# Patient Record
Sex: Female | Born: 1935 | Race: White | Hispanic: No | Smoking: Never smoker
Health system: Southern US, Community
[De-identification: ages and names within clinical notes are randomized; demographics above are authoritative.]

## PROBLEM LIST (undated history)

## (undated) DIAGNOSIS — F0390 Unspecified dementia without behavioral disturbance: Secondary | ICD-10-CM

## (undated) DIAGNOSIS — F329 Major depressive disorder, single episode, unspecified: Secondary | ICD-10-CM

## (undated) DIAGNOSIS — M810 Age-related osteoporosis without current pathological fracture: Secondary | ICD-10-CM

## (undated) DIAGNOSIS — R32 Unspecified urinary incontinence: Secondary | ICD-10-CM

## (undated) DIAGNOSIS — Z9181 History of falling: Secondary | ICD-10-CM

## (undated) DIAGNOSIS — M79643 Pain in unspecified hand: Secondary | ICD-10-CM

## (undated) DIAGNOSIS — R404 Transient alteration of awareness: Secondary | ICD-10-CM

## (undated) DIAGNOSIS — R269 Unspecified abnormalities of gait and mobility: Secondary | ICD-10-CM

## (undated) DIAGNOSIS — F411 Generalized anxiety disorder: Secondary | ICD-10-CM

## (undated) DIAGNOSIS — H9190 Unspecified hearing loss, unspecified ear: Secondary | ICD-10-CM

## (undated) DIAGNOSIS — B029 Zoster without complications: Secondary | ICD-10-CM

## (undated) DIAGNOSIS — E039 Hypothyroidism, unspecified: Secondary | ICD-10-CM

## (undated) DIAGNOSIS — K5900 Constipation, unspecified: Secondary | ICD-10-CM

## (undated) DIAGNOSIS — N8110 Cystocele, unspecified: Secondary | ICD-10-CM

## (undated) DIAGNOSIS — I10 Essential (primary) hypertension: Secondary | ICD-10-CM

## (undated) DIAGNOSIS — R1310 Dysphagia, unspecified: Secondary | ICD-10-CM

## (undated) HISTORY — DX: Unspecified abnormalities of gait and mobility: R26.9

## (undated) HISTORY — DX: Transient alteration of awareness: R40.4

## (undated) HISTORY — DX: Cystocele, unspecified: N81.10

## (undated) HISTORY — DX: Major depressive disorder, single episode, unspecified: F32.9

## (undated) HISTORY — DX: Generalized anxiety disorder: F41.1

## (undated) HISTORY — DX: Age-related osteoporosis without current pathological fracture: M81.0

## (undated) HISTORY — DX: Dysphagia, unspecified: R13.10

## (undated) HISTORY — DX: History of falling: Z91.81

## (undated) HISTORY — DX: Constipation, unspecified: K59.00

## (undated) HISTORY — DX: Unspecified hearing loss, unspecified ear: H91.90

## (undated) HISTORY — DX: Pain in unspecified hand: M79.643

## (undated) HISTORY — DX: Essential (primary) hypertension: I10

## (undated) HISTORY — DX: Unspecified urinary incontinence: R32

## (undated) HISTORY — DX: Zoster without complications: B02.9

---

## 1999-06-24 ENCOUNTER — Emergency Department (HOSPITAL_COMMUNITY): Admission: EM | Admit: 1999-06-24 | Discharge: 1999-06-24 | Payer: Self-pay | Admitting: Emergency Medicine

## 1999-06-26 ENCOUNTER — Emergency Department (HOSPITAL_COMMUNITY): Admission: EM | Admit: 1999-06-26 | Discharge: 1999-06-26 | Payer: Self-pay | Admitting: *Deleted

## 2000-01-01 ENCOUNTER — Encounter: Payer: Self-pay | Admitting: Emergency Medicine

## 2000-01-01 ENCOUNTER — Emergency Department (HOSPITAL_COMMUNITY): Admission: EM | Admit: 2000-01-01 | Discharge: 2000-01-01 | Payer: Self-pay | Admitting: Emergency Medicine

## 2003-08-22 DIAGNOSIS — B029 Zoster without complications: Secondary | ICD-10-CM

## 2003-08-22 HISTORY — DX: Zoster without complications: B02.9

## 2006-01-15 ENCOUNTER — Emergency Department (HOSPITAL_COMMUNITY): Admission: EM | Admit: 2006-01-15 | Discharge: 2006-01-15 | Payer: Self-pay | Admitting: Emergency Medicine

## 2006-01-17 ENCOUNTER — Inpatient Hospital Stay (HOSPITAL_COMMUNITY): Admission: EM | Admit: 2006-01-17 | Discharge: 2006-01-24 | Payer: Self-pay | Admitting: Emergency Medicine

## 2006-01-17 ENCOUNTER — Ambulatory Visit: Payer: Self-pay | Admitting: Cardiology

## 2006-01-18 ENCOUNTER — Encounter: Payer: Self-pay | Admitting: Internal Medicine

## 2006-01-18 ENCOUNTER — Encounter: Payer: Self-pay | Admitting: Vascular Surgery

## 2006-02-02 ENCOUNTER — Encounter: Admission: RE | Admit: 2006-02-02 | Discharge: 2006-02-02 | Payer: Self-pay | Admitting: Radiology

## 2006-12-24 HISTORY — PX: SPINE SURGERY: SHX786

## 2008-04-13 ENCOUNTER — Emergency Department (HOSPITAL_COMMUNITY): Admission: EM | Admit: 2008-04-13 | Discharge: 2008-04-13 | Payer: Self-pay | Admitting: Emergency Medicine

## 2008-05-26 ENCOUNTER — Observation Stay (HOSPITAL_COMMUNITY): Admission: EM | Admit: 2008-05-26 | Discharge: 2008-05-28 | Payer: Self-pay | Admitting: Emergency Medicine

## 2008-05-26 ENCOUNTER — Encounter (INDEPENDENT_AMBULATORY_CARE_PROVIDER_SITE_OTHER): Payer: Self-pay | Admitting: Internal Medicine

## 2008-06-23 HISTORY — PX: INCISION / DRAINAGE HAND / FINGER: SUR695

## 2009-07-16 ENCOUNTER — Emergency Department (HOSPITAL_COMMUNITY): Admission: EM | Admit: 2009-07-16 | Discharge: 2009-07-16 | Payer: Self-pay | Admitting: Emergency Medicine

## 2010-01-04 ENCOUNTER — Inpatient Hospital Stay (HOSPITAL_COMMUNITY)
Admission: EM | Admit: 2010-01-04 | Discharge: 2010-01-07 | Payer: Self-pay | Source: Home / Self Care | Attending: Internal Medicine | Admitting: Internal Medicine

## 2010-03-15 DIAGNOSIS — R32 Unspecified urinary incontinence: Secondary | ICD-10-CM | POA: Insufficient documentation

## 2010-03-15 DIAGNOSIS — I10 Essential (primary) hypertension: Secondary | ICD-10-CM

## 2010-03-15 DIAGNOSIS — M810 Age-related osteoporosis without current pathological fracture: Secondary | ICD-10-CM

## 2010-03-15 DIAGNOSIS — Z9181 History of falling: Secondary | ICD-10-CM

## 2010-03-15 DIAGNOSIS — F329 Major depressive disorder, single episode, unspecified: Secondary | ICD-10-CM

## 2010-03-15 DIAGNOSIS — K5900 Constipation, unspecified: Secondary | ICD-10-CM

## 2010-03-15 DIAGNOSIS — R404 Transient alteration of awareness: Secondary | ICD-10-CM

## 2010-03-15 DIAGNOSIS — R1310 Dysphagia, unspecified: Secondary | ICD-10-CM

## 2010-03-15 DIAGNOSIS — R269 Unspecified abnormalities of gait and mobility: Secondary | ICD-10-CM | POA: Insufficient documentation

## 2010-03-15 DIAGNOSIS — F411 Generalized anxiety disorder: Secondary | ICD-10-CM

## 2010-03-15 DIAGNOSIS — F339 Major depressive disorder, recurrent, unspecified: Secondary | ICD-10-CM | POA: Insufficient documentation

## 2010-03-15 DIAGNOSIS — F3289 Other specified depressive episodes: Secondary | ICD-10-CM

## 2010-03-15 HISTORY — DX: Unspecified abnormalities of gait and mobility: R26.9

## 2010-03-15 HISTORY — DX: Transient alteration of awareness: R40.4

## 2010-03-15 HISTORY — DX: Unspecified urinary incontinence: R32

## 2010-03-15 HISTORY — DX: Other specified depressive episodes: F32.89

## 2010-03-15 HISTORY — DX: Age-related osteoporosis without current pathological fracture: M81.0

## 2010-03-15 HISTORY — DX: Essential (primary) hypertension: I10

## 2010-03-15 HISTORY — DX: Dysphagia, unspecified: R13.10

## 2010-03-15 HISTORY — DX: Major depressive disorder, single episode, unspecified: F32.9

## 2010-03-15 HISTORY — DX: History of falling: Z91.81

## 2010-03-15 HISTORY — DX: Constipation, unspecified: K59.00

## 2010-03-15 HISTORY — DX: Generalized anxiety disorder: F41.1

## 2010-04-05 LAB — COMPREHENSIVE METABOLIC PANEL
ALT: 18 U/L (ref 0–35)
Albumin: 3.7 g/dL (ref 3.5–5.2)
Creatinine, Ser: 0.82 mg/dL (ref 0.4–1.2)
GFR calc non Af Amer: >60 mL/min (ref >60)
Total Bilirubin: 1.2 mg/dL (ref 0.3–1.2)

## 2010-04-05 LAB — BASIC METABOLIC PANEL
Creatinine, Ser: 0.84 mg/dL (ref 0.4–1.2)
GFR calc non Af Amer: >60 mL/min (ref >60)
Sodium: 136 mEq/L (ref 135–145)

## 2010-04-05 LAB — CBC
HCT: 36.2 % (ref 36.0–46.0)
Hemoglobin: 12.6 g/dL (ref 12.0–15.0)
MCV: 100 fL (ref 78.0–100.0)
Platelets: 272 10*3/uL (ref 150–400)
RBC: 3.6 MIL/uL — ABNORMAL LOW (ref 3.87–5.11)
RDW: 12.6 % (ref 11.5–15.5)
WBC: 15.7 10*3/uL — ABNORMAL HIGH (ref 4.0–10.5)

## 2010-04-05 LAB — URINALYSIS, ROUTINE W REFLEX MICROSCOPIC
Protein, ur: NEGATIVE mg/dL
Specific Gravity, Urine: 1.012 (ref 1.005–1.030)
pH: 7.5 (ref 5.0–8.0)

## 2010-04-05 LAB — DIFFERENTIAL
Eosinophils Absolute: 0 10*3/uL (ref 0.0–0.7)
Eosinophils Relative: 0 % (ref 0–5)
Lymphocytes Relative: 7 % — ABNORMAL LOW (ref 12–46)
Lymphs Abs: 1.1 10*3/uL (ref 0.7–4.0)
Monocytes Relative: 7 % (ref 3–12)
Neutro Abs: 13.5 10*3/uL — ABNORMAL HIGH (ref 1.7–7.7)

## 2010-04-10 LAB — BASIC METABOLIC PANEL
CO2: 26 mEq/L (ref 19–32)
Chloride: 98 mEq/L (ref 96–112)
GFR calc non Af Amer: >60 mL/min (ref >60)
Glucose, Bld: 100 mg/dL — ABNORMAL HIGH (ref 70–99)
Potassium: 3.5 mEq/L (ref 3.5–5.1)
Sodium: 137 mEq/L (ref 135–145)

## 2010-04-10 LAB — CBC
Hemoglobin: 13.5 g/dL (ref 12.0–15.0)
MCH: 35 pg — ABNORMAL HIGH (ref 26.0–34.0)
MCHC: 34.3 g/dL (ref 30.0–36.0)
Platelets: 326 10*3/uL (ref 150–400)

## 2010-04-10 LAB — CULTURE, ROUTINE-ABSCESS

## 2010-04-10 LAB — DIFFERENTIAL
Basophils Relative: 0 % (ref 0–1)
Eosinophils Absolute: 0.1 10*3/uL (ref 0.0–0.7)
Lymphs Abs: 1 10*3/uL (ref 0.7–4.0)
Neutro Abs: 8.4 10*3/uL — ABNORMAL HIGH (ref 1.7–7.7)
Neutrophils Relative %: 81 % — ABNORMAL HIGH (ref 43–77)

## 2010-04-10 LAB — PROTIME-INR
INR: 0.91 (ref 0.00–1.49)
Prothrombin Time: 12.2 seconds (ref 11.6–15.2)

## 2010-05-03 LAB — URINALYSIS, ROUTINE W REFLEX MICROSCOPIC
Bilirubin Urine: NEGATIVE
Glucose, UA: NEGATIVE mg/dL
Hgb urine dipstick: NEGATIVE
Nitrite: NEGATIVE
Protein, ur: NEGATIVE mg/dL
Specific Gravity, Urine: 1.015 (ref 1.005–1.030)
Urobilinogen, UA: 1 mg/dL (ref 0.0–1.0)
pH: 7.5 (ref 5.0–8.0)

## 2010-05-03 LAB — CBC
HCT: 38.8 % (ref 36.0–46.0)
Hemoglobin: 13.5 g/dL (ref 12.0–15.0)
MCHC: 34.8 g/dL (ref 30.0–36.0)
MCV: 99.8 fL (ref 78.0–100.0)
Platelets: 447 K/uL — ABNORMAL HIGH (ref 150–400)
RBC: 3.89 MIL/uL (ref 3.87–5.11)
RDW: 12 % (ref 11.5–15.5)
WBC: 9.6 K/uL (ref 4.0–10.5)

## 2010-05-03 LAB — DIFFERENTIAL
Basophils Absolute: 0 10*3/uL (ref 0.0–0.1)
Basophils Relative: 0 % (ref 0–1)
Eosinophils Absolute: 0 10*3/uL (ref 0.0–0.7)
Eosinophils Relative: 0 % (ref 0–5)
Lymphocytes Relative: 13 % (ref 12–46)
Lymphs Abs: 1.3 K/uL (ref 0.7–4.0)
Monocytes Absolute: 0.8 K/uL (ref 0.1–1.0)
Monocytes Relative: 9 % (ref 3–12)
Neutro Abs: 7.5 10*3/uL (ref 1.7–7.7)
Neutrophils Relative %: 78 % — ABNORMAL HIGH (ref 43–77)

## 2010-05-03 LAB — LIPID PANEL
HDL: 58 mg/dL (ref >39)
LDL Cholesterol: 81 mg/dL (ref 0–99)
Total CHOL/HDL Ratio: 2.7 RATIO
Triglycerides: 84 mg/dL (ref <150)
VLDL: 17 mg/dL (ref 0–40)

## 2010-05-03 LAB — POCT CARDIAC MARKERS
CKMB, poc: <1 ng/mL — ABNORMAL LOW (ref 1.0–8.0)
Myoglobin, poc: 85.3 ng/mL (ref 12–200)
Troponin i, poc: <0.05 ng/mL (ref 0.00–0.09)

## 2010-05-03 LAB — BASIC METABOLIC PANEL
BUN: 12 mg/dL (ref 6–23)
BUN: 15 mg/dL (ref 6–23)
Calcium: 8.3 mg/dL — ABNORMAL LOW (ref 8.4–10.5)
Calcium: 8.6 mg/dL (ref 8.4–10.5)
Chloride: 99 mEq/L (ref 96–112)
Chloride: 99 mEq/L (ref 96–112)
Creatinine, Ser: 0.58 mg/dL (ref 0.4–1.2)
Creatinine, Ser: 0.64 mg/dL (ref 0.4–1.2)

## 2010-05-03 LAB — POCT I-STAT, CHEM 8
Chloride: 92 mEq/L — ABNORMAL LOW (ref 96–112)
HCT: 41 % (ref 36.0–46.0)
Hemoglobin: 13.9 g/dL (ref 12.0–15.0)
Potassium: 4.5 mEq/L (ref 3.5–5.1)
Sodium: 127 mEq/L — ABNORMAL LOW (ref 135–145)

## 2010-05-03 LAB — COMPREHENSIVE METABOLIC PANEL WITH GFR
AST: 21 U/L (ref 0–37)
Albumin: 4.3 g/dL (ref 3.5–5.2)
Calcium: 9.7 mg/dL (ref 8.4–10.5)
Chloride: 92 meq/L — ABNORMAL LOW (ref 96–112)
Creatinine, Ser: 0.82 mg/dL (ref 0.4–1.2)
GFR calc Af Amer: >60 mL/min (ref >60)
Total Bilirubin: 1 mg/dL (ref 0.3–1.2)

## 2010-05-03 LAB — OSMOLALITY: Osmolality: 269 mOsm/kg — ABNORMAL LOW (ref 275–300)

## 2010-05-03 LAB — COMPREHENSIVE METABOLIC PANEL
ALT: 15 U/L (ref 0–35)
Alkaline Phosphatase: 66 U/L (ref 39–117)
BUN: 16 mg/dL (ref 6–23)
CO2: 28 mEq/L (ref 19–32)
GFR calc non Af Amer: >60 mL/min (ref >60)
Glucose, Bld: 126 mg/dL — ABNORMAL HIGH (ref 70–99)
Potassium: 4.1 mEq/L (ref 3.5–5.1)
Sodium: 129 mEq/L — ABNORMAL LOW (ref 135–145)
Total Protein: 6.8 g/dL (ref 6.0–8.3)

## 2010-05-03 LAB — MAGNESIUM
Magnesium: 1.4 mg/dL — ABNORMAL LOW (ref 1.5–2.5)
Magnesium: 2.2 mg/dL (ref 1.5–2.5)

## 2010-05-05 LAB — POCT CARDIAC MARKERS

## 2010-05-05 LAB — POCT I-STAT, CHEM 8
BUN: 15 mg/dL (ref 6–23)
Calcium, Ion: 0.98 mmol/L — ABNORMAL LOW (ref 1.12–1.32)
Creatinine, Ser: 0.8 mg/dL (ref 0.4–1.2)
Hemoglobin: 13.6 g/dL (ref 12.0–15.0)
TCO2: 26 mmol/L (ref 0–100)
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
## 2010-06-07 NOTE — Discharge Summary (Signed)
[name], [name]              ACCOUNT NO.:  [id number]   MEDICAL RECORD NO.:  [id number]          PATIENT TYPE:  INP   LOCATION:  1513                         FACILITY:  [facility]   PHYSICIAN:  Marcellus Scott, MD     DATE OF BIRTH:  April 26, 1917   DATE OF ADMISSION:  05/26/2008  DATE OF DISCHARGE:  05/28/2008                               DISCHARGE SUMMARY   PRIMARY MEDICAL DOCTOR:  Ace Gins.   DISCHARGE DIAGNOSES:  1. Hyponatremia secondary to thiazide diuretics.  2. Hypokalemia, repleted.  3. Hypertension.  4. History of depression.  5. History of frequent falls.  6. History of T12 fracture status post vertebroplasty.  7. History of syncope.   DISCHARGE MEDICATIONS:  1. Celexa 20 mg p.o. daily.  2. Norvasc 10 mg p.o. daily.  3. Avapro 300 mg p.o. daily.   DISCONTINUE MEDICATIONS:  Lisinopril/hydrochlorothiazide.   PROCEDURES:  1. Chest x-ray on May 26, 2008 - impression:  No acute cardiopulmonary      abnormality.  2. CT of the head without contrast - impression:  No acute      intracranial abnormality.  Chronic small-vessel disease of the      white matter.  No interval change.   PERTINENT LABORATORY DATA:  Magnesium 2.2.  Basic metabolic panel:  Sodium 130, potassium 4.5, chloride 99, bicarb 24, glucose 110, BUN 15,  creatinine 0.58, calcium 8.6.  Serum osmolality 269.  TSH 2.138.  Urine  sodium 85.  Lipid panel unremarkable.  Urine creatinine 112.  Hepatic  panel within normal limits.  Urinalysis with no features suggestive of  UTI.  No proteinuria or blood.  Point of care cardiac markers x1  negative.  Initial sodium on BMET was 127.   CONSULTATIONS:  None.   HOSPITAL COURSE AND PATIENT DISPOSITION:  [name] is a very pleasant  77 year old Caucasian female patient with history of hypertension and  depression who presented with generalized weakness, fatigue, poor oral  intake.  She said she was so weak that she could not even go out  shopping, and her  appetite was poor.  Evaluation in the emergency room  revealed blood pressure of 170/80 with significant orthostatic changes.  Her serum sodium as indicated was 129 with normal BUN and creatinine.  She was evaluated to have dehydration with associated hyponatremia from  hydrochlorothiazide and orthostatic hypotension secondary to same.  The  patient was thereby admitted for further evaluation and management.   1. Hyponatremia.  This was most likely secondary to      hydrochlorothiazide and dehydration secondary to poor oral intake.      Her hydrochlorothiazide was discontinued.  The patient was hydrated      with IV normal saline with improvement in her serum sodium.  Within      the first 24 hours, she perked up and felt much stronger.  Has been      pleasant and cheerful since, ambulating the hallways, meeting with      other patients.  Her appetite is good.  She denies any complaints.      She specifically denies any  delusions, hallucinations, suicidal or      homicidal ideations.  She does not complain of any dizziness,      lightheadedness, chest pain or palpitations.  Will discontinue her      hydrochlorothiazide.  Her blood pressures now range mostly between      120s to 130s/70s to 80s.  If the hyponatremia persists, consider      changing her Celexa to another agent.  2. Hypokalemia which was probably secondary to diuretics was repleted      and corrected.  3. Hypertension.  Management as indicated above.  4. Dehydration - resolved.  5. Depression.  Although patient had been started on Effexor in      addition to the Celexa, I believe her initial presentation was most      likely secondary to her depleted volume status and hyponatremia,      and hence, we will discontinue Effexor and leave her on Celexa      alone and to consider adjusting medications as deemed necessary as      an outpatient.   The patient at this time is stable for discharge home to follow up with  her  primary M.D. in 1 week's time with repeat basic metabolic panel.   Time taken in coordinating this discharge was 25 minutes.      Marcellus Scott, MD  Electronically Signed     AH/MEDQ  D:  05/28/2008  T:  05/28/2008  Job:  [id number]   cc:   Ace Gins, MD

## 2010-06-10 NOTE — Discharge Summary (Signed)
NAMELEAR, CARSTENS              ACCOUNT NO.:  [id number]   MEDICAL RECORD NO.:  [id number]          PATIENT TYPE:  INP   LOCATION:  2041                         FACILITY:  [facility]   PHYSICIAN:  Marcellus Scott, MD     DATE OF BIRTH:  1917/09/13   DATE OF ADMISSION:  01/16/2006  DATE OF DISCHARGE:                               DISCHARGE SUMMARY   INTERIM DISCHARGE SUMMARY:   DATE OF DISCHARGE:  To be determined.   PRIMARY CARE PHYSICIAN:  [facility].   DISCHARGE DIAGNOSES:  1. T12 fracture.  2. History of recurrent falls.  3. Hyponatremia.  4. Hypokalemia.  5. Hypertension.  6. Long QT.  7. Leukocytosis.  8. Skin rash.   DISCHARGE MEDICATIONS:  To be determined on actual discharge.   PROCEDURES:  1. On January 19, 2006, vertebroplasty by interventional radiology,      which was technically successful.  Please see their notes for      further details.  2. On January 18, 2006, MRI of the spine without and with contrast.      Impression:  (1) Moderately severe acute fracture of T12 with      retropulsion of bone into the canal causing mild spinal stenosis,      but no compression of the conus medullaris.  (2) Minimal acute      fracture of the superior endplate of L4 on the left.  (3) Right      paracentral disk protrusion with swelling and moderate spinal      stenosis at L4 and L5.  3. On January 17, 2006, an x-ray of the pelvis.  Impression:  No      acute abnormality.  4. On January 17, 2006, an x-ray of the thoracic spine.  Impression:      (1) No new abnormality since January 15, 2006.  (2) Stable  50% T12 superior endplate compression fracture.  1. The patient had an echocardiogram done on January 18, 2006.      Overall LV function was normal and ejection fraction was estimated      to be 65%.  2. The patient had carotid Dopplers as a part of the workup.  There      was no significant right ICA stenosis or left ICA stenosis.  3. Urine  culture was reported as no growth.   CONSULTATIONS:  1. Interventional radiology.  2. [facility].   HOSPITAL COURSE AND CONDITION OF PATIENT:  For details of the initial  part of the admission, please refer to the history and physical note  done by Dr. Elliot Cousin on January 16, 2006.  In summary, Ms. Moylan  is an 77 year old pleasant female patient with a history of  hypertension, generalized anxiety, degenerative joint disease, and  hypokalemia, who presented to the emergency department with complaints  of back pain and inability to ambulate secondary to the same.  She had  sustained falls of at least twice prior to this admission.  She was  evaluated in the emergency room on January 15, 2006, and was found to  have a T12 wedge  compression fracture.  She was given a prescription for  Vicodin and sent home; however, the pain persisted and the patient  presented to the emergency room.  On further evaluation in the emergency  room, she was found to have a T12 compression fracture, as well as  electrolyte abnormalities, and was admitted for further evaluation.   1. T12 compression fracture.  The patient was admitted to the      hospital.  She had an MRI of the spine done with and without      contrast.  The findings are as above.  Interventional radiology was      consulted, who did a T12 vertebroplasty after cardiac clearance by      cardiology on January 19, 2006.  They followed the patient until      today and have signed off.  The patient has progressively done well      with decrease in pain and she has been out of bed to chair today,      which was also with only mild pain.  The patient has been evaluated      by physical therapy and occupational therapy, who recommended home      health and PT at discharge with a rolling walker plus a three-in-      one commode.  2. History of recurring falls.  The exact etiology of these falls is      not clear; however, it may be  multifactorial related to her      degenerative joint disease, arthritis, electrolyte abnormalities,      weakness, and the aforementioned.  The patient lives alone.  A case      management consult will be done to assess the safety of the patient      returning home with home health versus the need for a skilled      nursing facility placement.  The patient had an echocardiogram done      on January 18, 2006.  Overall LV function was normal and ejection      fraction was estimated to be 65%.  3. Hyponatremia.  On admission, the patient was found to have a sodium      of 124, potassium of 2.4, chloride of 84, bicarb of 28, glucose of      140, BUN of 19, creatinine of 0.9.  This was thought to be      secondary to the diuretics that the patient was on, which was      promptly held.  The patient was placed on intravenous saline      hydration, as well as her potassium and magnesium were      supplemented, which have progressively improved.  The most current      labs are sodium of 131, potassium 3.6, chloride 92, bicarb 29,      glucose 131, BUN 12, creatinine 0.6, and calcium of 9.5.  For      further evaluation of the hyponatremia, she had serum osmolality,      which was 272; a urine osmolality of 610; urine sodium of 130;      urine potassium of 94.  The etiology of this hyponatremia is      probably secondary to the diuretics, but could be SIADH or      questionable salt losing state.  However, this has remained steady      and she is asymptomatic of this and stable.  This has to be  followed up as an outpatient.  4. Hypokalemia, which has been repleted and corrected.  5. Hypertension.  The patient has been running intermittent blood      pressures in 180s.  Norvasc was added at 2.5 mg on January 19, 2006, but the blood pressure still continued to be elevated.  So,      the Norvasc will be titrated up. 6. Long QT.  On initial admission, the patient was noted to have a       long QT of 615.  This was probably secondary to her electrolyte      abnormalities.  Cardiology was consulted, who suggested repleting      the electrolytes.  With that, the QT has promptly decreased to 429,      however; I had discussed this with one of the patient's daughters,      and also suggested that the patient's children be evaluated by      their primary care physicians by EKGs for long QT.  7. Leukocytosis.  The patient had a white blood cell count on      admission of 13.3.  This was thought to be stress related and there      was no focus of sepsis, which has normalized.  8. Skin rash, which has not responded to hydrocortisone.  We      prescribed Bactroban and to followup.  9. Normocytic anemia.  B12 level is 642 and folate level is 19.4.      This may be an anemia of chronic disease, which will need to be      worked up as an outpatient.      Marcellus Scott, MD  Electronically Signed     AH/MEDQ  D:  01/21/2006  T:  01/22/2006  Job:  [id number]   cc:   [facility]

## 2010-06-10 NOTE — H&P (Signed)
[name], [name] NO.:  [id number]   MEDICAL RECORD NO.:  [id number]          PATIENT TYPE:  INP   LOCATION:  2041                         FACILITY:  [facility]   PHYSICIAN:  Elliot Cousin, M.D.    DATE OF BIRTH:  01-Jul-1917   DATE OF ADMISSION:  01/16/2006  DATE OF DISCHARGE:                              HISTORY & PHYSICAL   PRIMARY CARE PHYSICIAN:  [facility]   CHIEF COMPLAINT:  Thoracic back pain, lower back pain, inability to walk  secondary to pain.   HISTORY OF PRESENT ILLNESS:  The patient is an 77 year old woman with a  past medical history significant for hypertension and anxiety who  presents to the Emergency Department with a chief complaint of back pain  and the inability to ambulate.  The patient says that she was putting up  Christmas decorations last week and accidentally fell off of the ladder.  This occurred approximately 1 week ago.  There was no loss of  consciousness or head trauma.  She did have pain in her mid to lower  back and her hips.  Over the course of the past several days, the  patient's pain became progressively worse.  She actually presented to  the Emergency Department on January 15, 2006.  At that time, the x-ray  of her spine revealed a T12 wedge compression deformity.  The patient  was given a prescription for Vicodin and sent home.  Over the past 24  hours, the patient's pain has progressed.  It is moderate to severe in  intensity.  It radiates from the mid back down to below her hips.  Movement worsens the pain.  Sitting still eases the pain.  The Vicodin  helped a little, however, it caused nausea and therefore she has not  taken anymore.  The patient denies numbness or tingling in her  extremities.  She denies shortness of breath or pleurisy.  The patient  lives alone.  Her children brought her in to the Emergency Department  tonight for assistance with pain management and disposition.   During the  evaluation in the Emergency Department, a followup x-ray of  her thoracic spine was ordered by the Emergency Department physician.  The x-ray reveals a stable T12 compression fracture.  Also of note, the  patient's serum potassium is low at 2.4 and she is hyponatremic with a  serum sodium of 125.  The patient will therefore be admitted for further  evaluation and management.   PAST MEDICAL HISTORY:  1. Hypertension.  2. Generalized anxiety.  3. Hypokalemia in the past.  4. Degenerative joint disease.   MEDICATIONS:  1. Potassium chloride 20 mEq daily.  2. Chlorthalidone 25 mg daily.  3. Atacand 32 mg daily.  4. Vicodin 5 mg every 4 hours as needed.  5. Lorazepam 0.5 mg b.i.d. p.r.n.   ALLERGIES:  No known drug allergies.   SOCIAL HISTORY:  The patient is divorced.  She lives alone in  [place].  She has four children.  She is a retired Investment banker, corporate.  She  denies tobacco use.  She drinks alcohol  only occasionally.  She is  fairly independent and still drives.   FAMILY HISTORY:  Her mother died of old age at 23 years old.  Her  father died of a heart attack at 39 years of age.   REVIEW OF SYSTEMS:  The patient's review of systems is positive for back  pain, poor appetite and nausea associated with Vicodin.  She  occasionally has insomnia. She has had generalized malaise, but no fever  or chills.  Otherwise, review of systems is negative.   EXAM:  Temperature 97.0, blood pressure 164/79, pulse 84, respiratory  rate 20, oxygen saturation 97% on room air.  GENERAL:  The patient is a pleasant, 77 year old, elderly, Caucasian  woman who is currently lying in bed in no acute distress at this time.  HEENT:  Head is normocephalic, nontraumatic.  Pupils equal, round and  reactive to light.  Extraocular movements are intact.  Conjunctivae are  clear, sclerae are white.  Tympanic membranes are clear bilaterally.  Nasal mucosa is mildly dry.  No sinus tenderness.  Oropharynx reveals   good dentition. Mucous membranes are moist are mildly dry.  No posterior  exudates or erythema.  NECK:  Neck is supple, no adenopathy, no thyromegaly, no bruit, no JVD.  LUNGS:  Decreased breath sounds in the bases, otherwise clear.  HEART:  S1, S2 with no murmurs, rubs, or gallops.  ABDOMEN:  Mildly obese, positive bowel sounds, soft, nontender,  nondistended, no hepatosplenomegaly.  No masses palpated.  BACK:  There is moderate tenderness over the thoracic and lumbosacral  spine and surrounding paraspinal muscles.  No appreciable warmth or  erythema or edema.  EXTREMITIES:  Pedal pulses palpable bilaterally.  No pretibial edema and  no pedal edema.  PELVIS: There is some mild to moderate tenderness over the sacrum.  No  tenderness over her legs bilaterally.  The patient is able to flex and  extend her hips and her knees with minimal discomfort bilaterally.  NEUROLOGIC:  The patient is alert and oriented x 3, cranial nerves II  through XII are intact.  Strength is mildly decreased in her lower  extremities secondary to discomfort, approximately 5-/5.  Sensation is  intact.   ADMISSION LABORATORIES:  X-ray of the spine reveals a stable, 50%  compression fracture at T12, no new changes from 01/15/2006.  There is  evidence of osteopenia and degenerative joint changes.  CT scan of the  head reveals no acute intracranial findings.  There is evidence of  atrophy and chronic microvascular white matter disease.  Remote  cerebellar infarct.   Sodium 124, potassium 2.4, chloride 84, CO2 28, glucose 140, BUN 19,  creatinine 0.9, calcium 9.1, total protein 6.9, albumin 3.8, AST 28, ALT  21, alkaline phosphatase 51.  WBC 13.3, hemoglobin 12.8, platelets 411.   ASSESSMENT:  1. Generalized failure to thrive, inability to ambulate secondary to      pain from the T12 fracture.  2. Hypokalemia.  The patient's serum potassium is 2.4.  She says that     she has a history of low potassium in the past  and is treated with      potassium supplementation chronically.  Of note, the patient says      that she has not taken her potassium supplement in approximately 1      week.  The hypokalemia is probably secondary to the chlorthalidone.      Magnesium deficiency will need to be ruled out.  3. Hyponatremia.  The patient's  serum sodium is 125.  The patient may      be volume depleted.  4. Mild hyperglycemia.  The patient gives no history of diabetes      mellitus.  Her venous glucose is mildly elevated at 140.  5. Leucocytosis.  The patient's WBC is 13.3.  She is afebrile.  No      obvious signs of infection at this time.  6. Hypertension.  The patient's blood pressure is moderately elevated.      She is chronically treated with Atacand and chlorthalidone.   PLAN:  1. The patient will be admitted for further evaluation and management.  2. We will check a MRI of the thoracic/lumbar spine.  We will also      order an x-ray of the pelvis to rule out a pelvic fracture.  3. Consult Interventional Radiology for evaluation  of a possible      vertebroplasty.  4. We will replete potassium chloride via oral supplementation and IV      fluids.  We will check a magnesium level to rule out deficiency.  5. Pain management with oxycodone and Dilaudid as needed.  6. Occupational Therapy and Physical Therapy consultations.  7. We will also evaluate the patient's TSH, urinalysis, hemoglobin A1c      and PT and PTT.  8. Gentle IV fluids with normal saline and potassium chloride added.   ADDENDUM:  The patient's EKG reveals prolonged QT interval and  nonspecific ST and T wave abnormalities. No old EKG is currently  available. We will assess cardiac enzymes and a magnesium level.      Elliot Cousin, M.D.  Electronically Signed     DF/MEDQ  D:  01/17/2006  T:  01/17/2006  Job:  [id number]

## 2010-06-10 NOTE — Discharge Summary (Signed)
[name], CYPHERS NO.:  [id number]   MEDICAL RECORD NO.:  [id number]          PATIENT TYPE:  INP   LOCATION:  2041                         FACILITY:  [facility]   PHYSICIAN:  Michaelyn Barter, M.D. DATE OF BIRTH:  August 10, 1917   DATE OF ADMISSION:  01/16/2006  DATE OF DISCHARGE:  01/24/2006                               DISCHARGE SUMMARY   The patient's primary care physician is [facility].   This is a final discharge summary that will follow the events of the  patient's hospitalization only during the timeframe of December 31 up  until January 24, 2006.  For events that occurred prior to December 31,  please refer to the prior discharge summary .   FINAL DIAGNOSES:  1. T12 fracture.  The patient initially complained of pain with      regards to a T12 fracture which at this time had been addressed via      vertebroplasty.  For the last two days of the patient's      hospitalization, she indicated that her pain was better.  Likewise,      by the last two days of hospitalization she indicated that she had      been up ambulating, and by the final day of her hospitalization she      indicated that her symptoms were significantly improved.  2.  With      regards to number 2, prolonged QRS issue resolved over the course      of the hospitalization.  Cardiology continued to follow the patient      up to the last day of her hospitalization.  2. Syncopal episode.  The patient indicated that she had some      difficulties with regards to producing a bowel movement during the      latter portion of her hospitalization.  She stated that she was      having to strain significantly in order to have a BM.  She was      provided with a laxative a well as an enema.  On January 23, 2006,      the patient indicated that while attempting to have a BM she      strained significantly and ended up having a syncopal episode.      This appeared to be vasovagal in nature.   As a result, she did have      a large or massive bowel movement.  The patient indicated that she      felt significantly better although weak after having the bowel      movement.  She was monitored over the next 24 hours, and she stated      that she felt significantly better.  She had no repeat episodes of      syncope.  Likewise, she never complained of any chest pain or      shortness of breath during the course of the hospitalization.  3. Frequent falling.  Physical Therapy did follow the patient over the      course of the hospitalization.  4. Hypokalemia.  Supplementation was required during the course of      this hospitalization to address the patient's potassium level.   CONDITION AT THE TIME OF DISCHARGE:  On the day of discharge, the  patient indicated that she felt very good.  She stated that her appetite  was great and indicated that she had been walking down the hall without  any problems.  Vitals, her  temperature was 98, her heart rate 84,  respirations 18, blood pressure 120/72.  O2 saturation 96% on room air.  The patient's laboratories, her sodium was 134, potassium 4.4, chloride  98, CO2 28, BUN 15, creatinine 0.8, glucose 111, calcium 10.1, magnesium  1.7.   A decision was made to discharge the patient from the hospital.  The  patient was discharged on the following medication:  1. Lisinopril 5 mg 1 tablet p.o. daily.  2. Senokot-S 1 tablet p.o. daily.  3. Oxycodone 5 mg 1 tablet q.6h. p.r.n.  4. Norvasc 5 mg 1 tablet p.o. daily.  5. Calcium carbonate.  6. MiraLax 17 grams p.o. daily.  7. Avapro 300 mg 1 tablet p.o. daily.   The patient was told to avoid falling, to stop taking Atacand since the  Avapro had been started, and to follow up with [facility] within two to four weeks.      Michaelyn Barter, M.D.  Electronically Signed     OR/MEDQ  D:  01/27/2006  T:  01/27/2006  Job:  [id number]

## 2010-06-10 NOTE — Consult Note (Signed)
NAMEANNALYNN, CENTANNI              ACCOUNT NO.:  [id number]   MEDICAL RECORD NO.:  [id number]          PATIENT TYPE:  INP   LOCATION:  2041                         FACILITY:  [facility]   PHYSICIAN:  Bruce R. Juanda Chance, MD, FACCDATE OF BIRTH:  March 11, 1917   DATE OF CONSULTATION:  01/17/2006  DATE OF DISCHARGE:                                 CONSULTATION   REASON FOR CONSULTATION:  Abnormal electrocardiogram.   CLINICAL HISTORY:  Mrs. Etheridge is 77 years old and suffered a fall a week  ago and subsequently developed worse pain and difficulty standing.  She  was found to have a T12 compression fracture and was admitted to the  hospital for therapy.  After admission she was found to have  hyponatremia with sodium of 125, hypokalemia with a potassium of 2.4 and  a low magnesium of 1.6.  Her ECG showed a long QT with a QTc of 651  msec.  She also had poor R wave progression and nonspecific ST-T  changes.   She said she has had no previous cardiac history and no history of chest  pain, shortness of breath or palpitations.  She has no family history of  sudden cardiac death.   PAST MEDICAL HISTORY:  Her past medical history is significant for  hypertension, anxiety, previous hypokalemia and osteoporosis.   SOCIAL HISTORY:  She lives alone in [place].  She has four children.  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   FAMILY HISTORY:  Her mother died at age 55 and her father died at age  36 after suffering a myocardial infarction is his 67s.  She has no  siblings.   MEDICATIONS:  Her medications at home include Atacand, potassium,  lorazepam, chlorthalidone and hydrocodone.   REVIEW OF SYSTEMS:  Positive for anxiety and arthralgias.   PHYSICAL EXAMINATION:  The blood pressure is 136/74 and the pulse 86 and  regular.  There was no venous distension.  The carotid pulses were full  without bruits.  The chest was  clear without rales or rhonchi.  The  cardiac rhythm was regular.  The first and second heart sounds were  normal and I could hear no murmurs or gallops.  The abdomen was soft  with normal bowel sounds.  There was no hepatosplenomegaly.  The  peripheral pulses were full and there is no peripheral edema.  Musculoskeletal system showed no deformity.  She had a brace on her  chest and back.  The skin was warm and dry.  Neurologic examination  showed no focal neurological signs.   LABORATORIES:  Her troponin 0.07.  Her MB was 4.3 and her CK was 118.   IMPRESSION:  1. T12 fracture.  2. Abnormal electrocardiogram with markedly prolonged QT possibly      related to hypokalemia and low magnesium.  3. Hyponatremia, hypokalemia and low magnesium.  4. Hypertension.   RECOMMENDATIONS:  I agree with your plans to get a 2-D echocardiogram  and replace her potassium and magnesium.  The QT prolongation is quite  striking and we should repeat the ECG once her magnesium and potassium  are corrected.  I have discussed this situation with Dr. Graciela Husbands and he  suggests that we screen her children with ECGs to see if there is a  hereditary long QT syndrome in the family.  However, she has no family  history of sudden cardiac death or syncope.      Bruce Elvera Lennox Juanda Chance, MD, [facility]  Electronically Signed     BRB/MEDQ  D:  01/17/2006  T:  01/17/2006  Job:  [id number]   cc:   [facility]  [facility]

## 2011-02-14 DIAGNOSIS — R269 Unspecified abnormalities of gait and mobility: Secondary | ICD-10-CM | POA: Diagnosis not present

## 2011-02-14 DIAGNOSIS — I10 Essential (primary) hypertension: Secondary | ICD-10-CM | POA: Diagnosis not present

## 2011-02-14 DIAGNOSIS — Z9181 History of falling: Secondary | ICD-10-CM | POA: Diagnosis not present

## 2011-02-14 DIAGNOSIS — R1311 Dysphagia, oral phase: Secondary | ICD-10-CM | POA: Diagnosis not present

## 2011-05-08 DIAGNOSIS — H903 Sensorineural hearing loss, bilateral: Secondary | ICD-10-CM | POA: Diagnosis not present

## 2011-07-03 DIAGNOSIS — H35369 Drusen (degenerative) of macula, unspecified eye: Secondary | ICD-10-CM | POA: Diagnosis not present

## 2011-07-03 DIAGNOSIS — H5340 Unspecified visual field defects: Secondary | ICD-10-CM | POA: Diagnosis not present

## 2011-07-03 DIAGNOSIS — H2510 Age-related nuclear cataract, unspecified eye: Secondary | ICD-10-CM | POA: Diagnosis not present

## 2011-07-31 DIAGNOSIS — H40019 Open angle with borderline findings, low risk, unspecified eye: Secondary | ICD-10-CM | POA: Diagnosis not present

## 2011-08-14 DIAGNOSIS — I10 Essential (primary) hypertension: Secondary | ICD-10-CM | POA: Diagnosis not present

## 2011-08-22 DIAGNOSIS — I10 Essential (primary) hypertension: Secondary | ICD-10-CM | POA: Diagnosis not present

## 2011-08-22 DIAGNOSIS — R269 Unspecified abnormalities of gait and mobility: Secondary | ICD-10-CM | POA: Diagnosis not present

## 2011-08-22 DIAGNOSIS — H9190 Unspecified hearing loss, unspecified ear: Secondary | ICD-10-CM

## 2011-08-22 DIAGNOSIS — R32 Unspecified urinary incontinence: Secondary | ICD-10-CM | POA: Diagnosis not present

## 2011-08-22 HISTORY — DX: Unspecified hearing loss, unspecified ear: H91.90

## 2011-11-15 DIAGNOSIS — Z23 Encounter for immunization: Secondary | ICD-10-CM | POA: Diagnosis not present

## 2012-05-06 ENCOUNTER — Other Ambulatory Visit: Payer: Self-pay | Admitting: Internal Medicine

## 2012-08-08 ENCOUNTER — Other Ambulatory Visit: Payer: Self-pay | Admitting: Geriatric Medicine

## 2012-08-13 ENCOUNTER — Encounter: Payer: Self-pay | Admitting: Internal Medicine

## 2012-08-13 ENCOUNTER — Other Ambulatory Visit: Payer: Self-pay | Admitting: Geriatric Medicine

## 2012-08-13 ENCOUNTER — Non-Acute Institutional Stay: Payer: Medicare Other | Admitting: Internal Medicine

## 2012-08-13 VITALS — BP 122/72 | HR 68 | Ht 69.0 in | Wt 121.0 lb

## 2012-08-13 DIAGNOSIS — F329 Major depressive disorder, single episode, unspecified: Secondary | ICD-10-CM

## 2012-08-13 DIAGNOSIS — R269 Unspecified abnormalities of gait and mobility: Secondary | ICD-10-CM

## 2012-08-13 DIAGNOSIS — M79643 Pain in unspecified hand: Secondary | ICD-10-CM

## 2012-08-13 DIAGNOSIS — R32 Unspecified urinary incontinence: Secondary | ICD-10-CM | POA: Diagnosis not present

## 2012-08-13 DIAGNOSIS — M79609 Pain in unspecified limb: Secondary | ICD-10-CM

## 2012-08-13 DIAGNOSIS — I10 Essential (primary) hypertension: Secondary | ICD-10-CM | POA: Diagnosis not present

## 2012-08-13 HISTORY — DX: Pain in unspecified hand: M79.643

## 2012-08-13 MED ORDER — AMLODIPINE BESYLATE 10 MG PO TABS: 10.0000 mg | ORAL_TABLET | Freq: Every day | ORAL | Status: DC

## 2012-08-13 MED ORDER — LOSARTAN POTASSIUM 100 MG PO TABS: 100.0000 mg | ORAL_TABLET | Freq: Every day | ORAL | Status: DC

## 2012-08-13 NOTE — Patient Instructions (Signed)
Continue current medications. 

## 2012-08-13 NOTE — Progress Notes (Signed)
  Subjective:    Patient ID: [name], female    DOB: [birth year], 79 y.o.   MRN: [id number]  HPI Unspecified essential hypertension: controlled  Unspecified urinary incontinence: controlled on Vesicare  Abnormality of gait: uses walker sometimes.  Depressive disorder, not elsewhere classified: mild. No medications.  Current Outpatient Prescriptions on File Prior to Visit  Medication Sig Dispense Refill  . Calcium Carbonate-Vitamin D (CALCIUM 600+D) 600-200 MG-UNIT TABS Take by mouth. Take one daily for bones      . ibuprofen (ADVIL,MOTRIN) 200 MG tablet Take 200 mg by mouth. Take one as needed      . metoprolol succinate (TOPROL-XL) 25 MG 24 hr tablet Take 25 mg by mouth. Take 1/2 tablet once daily for blood pressure      . VESICARE 5 MG tablet TAKE 1 TABLET ONCE DAILY TO HELP CONTROL BLADDER.  30 tablet  5     She was also taking metoprolol and losartan. These were added to her list.    Review of Systems  Constitutional: Negative for activity change, appetite change and fatigue.  HENT: Positive for hearing loss. Negative for ear pain.   Eyes: Negative.   Respiratory: Negative.  Negative for shortness of breath.   Cardiovascular: Negative for chest pain, palpitations and leg swelling.  Gastrointestinal: Negative.   Endocrine: Negative.   Genitourinary:       Incontinence  Musculoskeletal:       Pain in both hands  Skin: Negative.   Neurological: Negative.   Hematological: Negative.   Psychiatric/Behavioral: Negative.        Objective:BP 122/72  Pulse 68  Ht 5\' 9"  (1.753 m)  Wt 121 lb (54.885 kg)  BMI 17.86 kg/m2    Physical Exam  Constitutional: She is oriented to person, place, and time. She appears well-developed and well-nourished. No distress.  HENT:  Head: Normocephalic and atraumatic.  Partial hearing loss.  Eyes: Conjunctivae and EOM are normal. Pupils are equal, round, and reactive to light.  Neck: Normal range of motion. Neck supple. No JVD  present. No tracheal deviation present. No thyromegaly present.  Cardiovascular: Normal rate, regular rhythm and normal heart sounds.  Exam reveals no gallop and no friction rub.   No murmur heard. Pulmonary/Chest: No respiratory distress. She has no wheezes. She has no rales. She exhibits no tenderness.  Abdominal: Bowel sounds are normal. She exhibits no distension and no mass. There is no tenderness.  Musculoskeletal: Normal range of motion. She exhibits no edema and no tenderness.  Pain in hands  Lymphadenopathy:    She has no cervical adenopathy.  Neurological: She is alert and oriented to person, place, and time. No cranial nerve deficit. Coordination normal.  Skin: No rash noted. No erythema. No pallor.  Psychiatric: She has a normal mood and affect. Her behavior is normal. Judgment and thought content normal.          Assessment & Plan:  Unspecified essential hypertension : continue amLODipine (NORVASC) 10 MG tablet, metoprolol,, and losartan  Unspecified urinary incontinence: continue Vesicare  Abnormality of gait: use cane when needed  Depressive disorder, not elsewhere classified: minor problem. No medication.  Pain in hand, unspecified laterality: try Aspercreme or Myoflex

## 2012-10-10 ENCOUNTER — Other Ambulatory Visit: Payer: Self-pay | Admitting: Internal Medicine

## 2012-10-24 DIAGNOSIS — Z23 Encounter for immunization: Secondary | ICD-10-CM | POA: Diagnosis not present

## 2012-11-14 ENCOUNTER — Other Ambulatory Visit: Payer: Self-pay | Admitting: Internal Medicine

## 2012-12-23 ENCOUNTER — Other Ambulatory Visit: Payer: Self-pay | Admitting: *Deleted

## 2012-12-23 DIAGNOSIS — I10 Essential (primary) hypertension: Secondary | ICD-10-CM

## 2012-12-23 MED ORDER — AMLODIPINE BESYLATE 10 MG PO TABS: 10.0000 mg | ORAL_TABLET | Freq: Every day | ORAL | Status: DC

## 2012-12-23 MED ORDER — LOSARTAN POTASSIUM 100 MG PO TABS: 100.0000 mg | ORAL_TABLET | Freq: Every day | ORAL | Status: DC

## 2013-01-28 ENCOUNTER — Encounter: Payer: Self-pay | Admitting: Internal Medicine

## 2013-02-04 ENCOUNTER — Encounter: Payer: Self-pay | Admitting: Internal Medicine

## 2013-02-24 ENCOUNTER — Other Ambulatory Visit: Payer: Self-pay | Admitting: Internal Medicine

## 2013-02-24 DIAGNOSIS — I10 Essential (primary) hypertension: Secondary | ICD-10-CM | POA: Diagnosis not present

## 2013-02-24 DIAGNOSIS — R32 Unspecified urinary incontinence: Secondary | ICD-10-CM | POA: Diagnosis not present

## 2013-02-24 DIAGNOSIS — Z79899 Other long term (current) drug therapy: Secondary | ICD-10-CM | POA: Diagnosis not present

## 2013-02-24 LAB — HEPATIC FUNCTION PANEL
ALK PHOS: 64 U/L (ref 25–125)
ALT: 9 U/L (ref 7–35)
AST: 18 U/L (ref 13–35)
BILIRUBIN, TOTAL: 0.7 mg/dL

## 2013-02-24 LAB — BASIC METABOLIC PANEL
BUN: 23 mg/dL — AB (ref 4–21)
CREATININE: 0.9 mg/dL (ref 0.5–1.1)
Glucose: 99 mg/dL
Potassium: 4.2 mmol/L (ref 3.4–5.3)
Sodium: 139 mmol/L (ref 137–147)

## 2013-02-24 LAB — LIPID PANEL
CHOLESTEROL: 209 mg/dL — AB (ref 0–200)
HDL: 57 mg/dL (ref 35–70)
LDL CALC: 127 mg/dL
TRIGLYCERIDES: 127 mg/dL (ref 40–160)

## 2013-03-04 ENCOUNTER — Non-Acute Institutional Stay: Payer: Medicare Other | Admitting: Internal Medicine

## 2013-03-04 ENCOUNTER — Encounter: Payer: Self-pay | Admitting: Internal Medicine

## 2013-03-04 VITALS — BP 142/78 | HR 64 | Ht 69.0 in | Wt 125.0 lb

## 2013-03-04 DIAGNOSIS — M6530 Trigger finger, unspecified finger: Secondary | ICD-10-CM | POA: Insufficient documentation

## 2013-03-04 DIAGNOSIS — R32 Unspecified urinary incontinence: Secondary | ICD-10-CM | POA: Diagnosis not present

## 2013-03-04 DIAGNOSIS — R269 Unspecified abnormalities of gait and mobility: Secondary | ICD-10-CM

## 2013-03-04 DIAGNOSIS — I10 Essential (primary) hypertension: Secondary | ICD-10-CM

## 2013-03-04 NOTE — Patient Instructions (Signed)
Use Aspercreme or Myoflex on hands 4 times daily.

## 2013-03-04 NOTE — Progress Notes (Signed)
Patient ID: [name], female   DOB: Dec 31, 1917, 80 y.o.   MRN: [id number]    Location:  [facility]   Place of Service: Clinic (12)    Allergies  Allergen Reactions  . Ace Inhibitors   . Hydrochlorothiazide   . Morphine And Related     Chief Complaint  Patient presents with  . Medical Managment of Chronic Issues    blood pressure, depression    HPI:   Generally feeling well.  Unspecified essential hypertension: controlled  Incontinence improved on Vesicare.    Medications: Patient's Medications  New Prescriptions   No medications on file  Previous Medications   AMLODIPINE (NORVASC) 10 MG TABLET    Take 1 tablet (10 mg total) by mouth daily. For blood pressure   CALCIUM CARBONATE-VITAMIN D (CALCIUM 600+D) 600-200 MG-UNIT TABS    Take by mouth. Take one daily for bones   IBUPROFEN (ADVIL,MOTRIN) 200 MG TABLET    Take 200 mg by mouth. Take one as needed   LOSARTAN (COZAAR) 100 MG TABLET    Take 1 tablet (100 mg total) by mouth daily. For blood pressure   METOPROLOL SUCCINATE (TOPROL-XL) 25 MG 24 HR TABLET    TAKE ONE AND 1/2 TABLET (37.5MG ) DAILY FOR BLOOD PRESSURE.   VESICARE 5 MG TABLET    TAKE 1 TABLET ONCE DAILY TO HELP CONTROL BLADDER.  Modified Medications   No medications on file  Discontinued Medications   No medications on file     Review of Systems  Constitutional: Negative for activity change, appetite change and fatigue.  HENT: Positive for hearing loss. Negative for ear pain.   Eyes: Negative.   Respiratory: Negative.  Negative for shortness of breath.   Cardiovascular: Negative for chest pain, palpitations and leg swelling.  Gastrointestinal: Negative.   Endocrine: Negative.   Genitourinary:       Incontinence  Musculoskeletal:       Pain in both hands  Skin: Negative.   Neurological: Negative.   Hematological: Negative.   Psychiatric/Behavioral: Negative.     Filed Vitals:   03/04/13 0907  BP: 142/78  Pulse: 64  Height:  5\' 9"  (1.753 m)  Weight: 125 lb (56.7 kg)   Physical Exam  Constitutional: She is oriented to person, place, and time. She appears well-developed and well-nourished. No distress.  HENT:  Head: Normocephalic and atraumatic.  Partial hearing loss.  Eyes: Conjunctivae and EOM are normal. Pupils are equal, round, and reactive to light.  Neck: Normal range of motion. Neck supple. No JVD present. No tracheal deviation present. No thyromegaly present.  Cardiovascular: Normal rate, regular rhythm and normal heart sounds.  Exam reveals no gallop and no friction rub.   No murmur heard. Pulmonary/Chest: No respiratory distress. She has no wheezes. She has no rales. She exhibits no tenderness.  Abdominal: Bowel sounds are normal. She exhibits no distension and no mass. There is no tenderness.  Musculoskeletal: Normal range of motion. She exhibits no edema and no tenderness.  Pain in hands. Right 3rd trigger finger. Uses walker due to unstable gait.  Lymphadenopathy:    She has no cervical adenopathy.  Neurological: She is alert and oriented to person, place, and time. No cranial nerve deficit. Coordination normal.  Skin: No rash noted. No erythema. No pallor.  Psychiatric: She has a normal mood and affect. Her behavior is normal. Judgment and thought content normal.     Labs reviewed: Nursing Home on 03/04/2013  Component Date Value Range Status  .  Glucose 02/24/2013 99   Final  . BUN 02/24/2013 23* 4 - 21 mg/dL Final  . Creatinine 02/24/2013 0.9  0.5 - 1.1 mg/dL Final  . Potassium 02/24/2013 4.2  3.4 - 5.3 mmol/L Final  . Sodium 02/24/2013 139  137 - 147 mmol/L Final  . Triglycerides 02/24/2013 127  40 - 160 mg/dL Final  . Cholesterol 02/24/2013 209* 0 - 200 mg/dL Final  . HDL 02/24/2013 57  35 - 70 mg/dL Final  . LDL Cholesterol 02/24/2013 127   Final  . Alkaline Phosphatase 02/24/2013 64  25 - 125 U/L Final  . ALT 02/24/2013 9  7 - 35 U/L Final  . AST 02/24/2013 18  13 - 35 U/L Final    . Bilirubin, Total 02/24/2013 0.7   Final      Assessment/Plan  Unspecified essential hypertension: controlled  Abnormality of gait: unchanged. Using walker more regularly  Unspecified urinary incontinence; improved with Vesicare

## 2013-03-21 ENCOUNTER — Encounter: Payer: Self-pay | Admitting: Internal Medicine

## 2013-03-26 ENCOUNTER — Other Ambulatory Visit: Payer: Self-pay | Admitting: Nurse Practitioner

## 2013-04-28 ENCOUNTER — Other Ambulatory Visit: Payer: Self-pay | Admitting: Internal Medicine

## 2013-08-12 DIAGNOSIS — M7150 Other bursitis, not elsewhere classified, unspecified site: Secondary | ICD-10-CM | POA: Diagnosis not present

## 2013-08-12 DIAGNOSIS — M2040 Other hammer toe(s) (acquired), unspecified foot: Secondary | ICD-10-CM | POA: Diagnosis not present

## 2013-08-12 DIAGNOSIS — IMO0002 Reserved for concepts with insufficient information to code with codable children: Secondary | ICD-10-CM | POA: Diagnosis not present

## 2013-08-12 DIAGNOSIS — M79609 Pain in unspecified limb: Secondary | ICD-10-CM | POA: Diagnosis not present

## 2013-08-21 DIAGNOSIS — M25579 Pain in unspecified ankle and joints of unspecified foot: Secondary | ICD-10-CM | POA: Diagnosis not present

## 2013-08-21 DIAGNOSIS — M779 Enthesopathy, unspecified: Secondary | ICD-10-CM | POA: Diagnosis not present

## 2013-09-03 ENCOUNTER — Other Ambulatory Visit: Payer: Self-pay | Admitting: Internal Medicine

## 2013-09-04 DIAGNOSIS — M25579 Pain in unspecified ankle and joints of unspecified foot: Secondary | ICD-10-CM | POA: Diagnosis not present

## 2013-09-04 DIAGNOSIS — M779 Enthesopathy, unspecified: Secondary | ICD-10-CM | POA: Diagnosis not present

## 2013-09-09 ENCOUNTER — Encounter: Payer: Self-pay | Admitting: Internal Medicine

## 2013-10-06 ENCOUNTER — Other Ambulatory Visit: Payer: Self-pay | Admitting: Internal Medicine

## 2013-11-08 DIAGNOSIS — Z23 Encounter for immunization: Secondary | ICD-10-CM | POA: Diagnosis not present

## 2013-12-01 DIAGNOSIS — B351 Tinea unguium: Secondary | ICD-10-CM | POA: Diagnosis not present

## 2013-12-01 DIAGNOSIS — L859 Epidermal thickening, unspecified: Secondary | ICD-10-CM | POA: Diagnosis not present

## 2013-12-11 DIAGNOSIS — E785 Hyperlipidemia, unspecified: Secondary | ICD-10-CM | POA: Diagnosis not present

## 2013-12-11 DIAGNOSIS — I10 Essential (primary) hypertension: Secondary | ICD-10-CM | POA: Diagnosis not present

## 2013-12-11 LAB — BASIC METABOLIC PANEL
BUN: 17 mg/dL (ref 4–21)
Creatinine: 0.8 mg/dL (ref 0.5–1.1)
Glucose: 98 mg/dL
POTASSIUM: 4.5 mmol/L (ref 3.4–5.3)
SODIUM: 135 mmol/L — AB (ref 137–147)

## 2013-12-11 LAB — LIPID PANEL
CHOLESTEROL: 188 mg/dL (ref 0–200)
HDL: 58 mg/dL (ref 35–70)
LDL Cholesterol: 106 mg/dL
LDl/HDL Ratio: 3.2
Triglycerides: 120 mg/dL (ref 40–160)

## 2013-12-11 LAB — HEPATIC FUNCTION PANEL
ALT: 10 U/L (ref 7–35)
AST: 16 U/L (ref 13–35)
Alkaline Phosphatase: 64 U/L (ref 25–125)
Bilirubin, Total: 0.8 mg/dL

## 2013-12-12 ENCOUNTER — Other Ambulatory Visit: Payer: Self-pay | Admitting: Internal Medicine

## 2013-12-16 ENCOUNTER — Encounter: Payer: Self-pay | Admitting: Internal Medicine

## 2013-12-16 ENCOUNTER — Non-Acute Institutional Stay: Payer: Medicare Other | Admitting: Internal Medicine

## 2013-12-16 VITALS — BP 168/78 | HR 64 | Temp 97.5°F | Ht 59.0 in | Wt 125.0 lb

## 2013-12-16 DIAGNOSIS — I10 Essential (primary) hypertension: Secondary | ICD-10-CM | POA: Diagnosis not present

## 2013-12-16 DIAGNOSIS — F329 Major depressive disorder, single episode, unspecified: Secondary | ICD-10-CM | POA: Diagnosis not present

## 2013-12-16 DIAGNOSIS — N3941 Urge incontinence: Secondary | ICD-10-CM

## 2013-12-16 DIAGNOSIS — R269 Unspecified abnormalities of gait and mobility: Secondary | ICD-10-CM | POA: Diagnosis not present

## 2013-12-16 DIAGNOSIS — G4700 Insomnia, unspecified: Secondary | ICD-10-CM

## 2013-12-16 DIAGNOSIS — M79643 Pain in unspecified hand: Secondary | ICD-10-CM | POA: Diagnosis not present

## 2013-12-16 DIAGNOSIS — F32A Depression, unspecified: Secondary | ICD-10-CM

## 2013-12-16 NOTE — Progress Notes (Signed)
Patient ID: Levin Bacon, female   DOB: 11-28-1917, 80 y.o.   MRN: [id number]    HISTORY AND PHYSICAL  Location:  [place] of Service: Clinic (12)   Extended Emergency Contact Information Primary Emergency Contact: [place] Phone: [phone] Relation: Other  Advanced Directive information Does patient have an advance directive?: Yes, Type of Advance Directive: Healthcare Power of [place];Living will  Chief Complaint  Patient presents with  . Annual Exam    Comprehensive Exam: blood pressure, depression, anxiety, gait  . Stress    about world events, doesn't sleep well, bad dreams, about people coming and taking her away.    HPI:  Essential hypertension: SBP elevated. She blames this on white coat syndrome. Denies headache, palpitations, or chest pain.  Pain of hand, unspecified laterality: osteoarthritis. Using Tylenol.  Abnormality of gait: unstable balance. Could not do exercises at [facility] because she could not keep her balance.  Depression: denies depression or hopeless feelings. Sh does have some anxiety "about the world situation"  Urge incontinence of urine: improved on Vesicare  Insomnia: better in the last couple of days. Has been worrying about Muslims and recent attacks in [place].    Past Medical History  Diagnosis Date  . Unspecified hearing loss 08/22/2011  . Anxiety state, unspecified 03/15/2010  . Depressive disorder, not elsewhere classified 03/15/2010  . Unspecified essential hypertension 03/15/2010  . Unspecified constipation 03/15/2010  . Senile osteoporosis 03/15/2010  . Other alteration of consciousness 03/15/2010  . Abnormality of gait 03/15/2010  . Dysphagia, unspecified(787.20) 03/15/2010  . Unspecified urinary incontinence 03/15/2010  . Personal history of fall 03/15/2010  . Herpes zoster without mention of complication 04/18/7122  . Pain in hand 08/13/12    Past Surgical History  Procedure Laterality Date  . Spine  surgery  12/2006  . Incision / drainage hand / finger Right 06/2008    hand    Patient Care Team: Estill Dooms, MD as PCP - General (Internal Medicine) [facility] Gearlean Alf, MD as Consulting Physician (Orthopedic Surgery)  History   Social History  . Marital Status: Divorced    Spouse Name: N/A    Number of Children: N/A  . Years of Education: N/A   Occupational History  . [employer]    Social History Main Topics  . Smoking status: Never Smoker   . Smokeless tobacco: Never Used  . Alcohol Use: Yes     Comment: one glass wine at dinner  . Drug Use: No  . Sexual Activity: No   Other Topics Concern  . Not on file   Social History Narrative   Lives at [facility]   Never smoked   Exercise walks a lot, with walker   POA, Living Will     reports that she has never smoked. She has never used smokeless tobacco. She reports that she drinks alcohol. She reports that she does not use illicit drugs.  Family History  Problem Relation Age of Onset  . Heart disease Father     MI   Family Status  Relation Status Death Age  . Mother Deceased 49    unknown "old age"  . Father Deceased 65  . Daughter Alive   . Son Alive   . Daughter Alive   . Son Alive     Immunization History  Administered Date(s) Administered  . Influenza Whole 10/24/2011, 10/24/2012  . Influenza-Unspecified 11/06/2013  .  Pneumococcal Polysaccharide-23 01/23/2009  . Td 01/23/2009    Allergies  Allergen Reactions  . Ace Inhibitors   . Hydrochlorothiazide   . Morphine And Related     Medications: Patient's Medications  New Prescriptions   No medications on file  Previous Medications   AMLODIPINE (NORVASC) 10 MG TABLET    TAKE (1) TABLET DAILY FOR HIGH BLOOD PRESSURE.   CALCIUM CARBONATE-VITAMIN D (CALCIUM 600+D) 600-200 MG-UNIT TABS    Take by mouth. Take one daily for bones   IBUPROFEN (ADVIL,MOTRIN) 200 MG TABLET    Take 200 mg by mouth.  Take one as needed   LOSARTAN (COZAAR) 100 MG TABLET    TAKE (1) TABLET DAILY FOR HIGH BLOOD PRESSURE.   METOPROLOL SUCCINATE (TOPROL-XL) 25 MG 24 HR TABLET    TAKE ONE AND 1/2 TABLET (37.5MG ) DAILY FOR BLOOD PRESSURE.   VESICARE 5 MG TABLET    TAKE 1 TABLET ONCE DAILY TO HELP CONTROL BLADDER.  Modified Medications   No medications on file  Discontinued Medications   No medications on file    Review of Systems  Constitutional: Negative for activity change, appetite change and fatigue.  HENT: Positive for hearing loss. Negative for ear pain.   Eyes: Negative.   Respiratory: Negative.  Negative for shortness of breath.   Cardiovascular: Negative for chest pain, palpitations and leg swelling.       HTN  Gastrointestinal: Negative.   Endocrine: Negative.   Genitourinary:       Incontinence  Musculoskeletal:       Pain in both hands: R>L.  Skin: Negative.   Neurological: Negative.   Hematological: Negative.   Psychiatric/Behavioral: The patient is nervous/anxious.     Filed Vitals:   12/16/13 1012  BP: 168/78  Pulse: 64  Temp: 97.5 F (36.4 C)  TempSrc: Oral  Height: 4\' 11"  (1.499 m)  Weight: 125 lb (56.7 kg)  SpO2: 93%   Body mass index is 25.23 kg/(m^2).  Physical Exam  Constitutional: She is oriented to person, place, and time. She appears well-developed and well-nourished. No distress.  HENT:  Head: Normocephalic and atraumatic.  Partial hearing loss.  Eyes: Conjunctivae and EOM are normal. Pupils are equal, round, and reactive to light.  Neck: Normal range of motion. Neck supple. No JVD present. No tracheal deviation present. No thyromegaly present.  Cardiovascular: Normal rate, regular rhythm and normal heart sounds.  Exam reveals no gallop and no friction rub.   No murmur heard. Pulmonary/Chest: No respiratory distress. She has no wheezes. She has no rales. She exhibits no tenderness.  Abdominal: Bowel sounds are normal. She exhibits no distension and no mass.  There is no tenderness.  Musculoskeletal: Normal range of motion. She exhibits no edema or tenderness.  Pain in hands. Right 3rd trigger finger. Uses walker due to unstable gait. Left 2nd toe is deviated to the great toe and has elevated DIP joint that rubs on her shoe.  Lymphadenopathy:    She has no cervical adenopathy.  Neurological: She is alert and oriented to person, place, and time. No cranial nerve deficit. Coordination normal.  Skin: No rash noted. No erythema. No pallor.  Psychiatric: She has a normal mood and affect. Her behavior is normal. Judgment and thought content normal.     Labs reviewed: Nursing Home on 12/16/2013  Component Date Value Ref Range Status  . Glucose 12/11/2013 98   Final  . BUN 12/11/2013 17  4 - 21 mg/dL Final  . Creatinine 12/11/2013 0.8  0.5 - 1.1 mg/dL Final  . Potassium 12/11/2013 4.5  3.4 - 5.3 mmol/L Final  . Sodium 12/11/2013 135* 137 - 147 mmol/L Final  . LDl/HDL Ratio 12/11/2013 3.2   Final  . Triglycerides 12/11/2013 120  40 - 160 mg/dL Final  . Cholesterol 12/11/2013 188  0 - 200 mg/dL Final  . HDL 12/11/2013 58  35 - 70 mg/dL Final  . LDL Cholesterol 12/11/2013 106   Final  . Alkaline Phosphatase 12/11/2013 64  25 - 125 U/L Final  . ALT 12/11/2013 10  7 - 35 U/L Final  . AST 12/11/2013 16  13 - 35 U/L Final  . Bilirubin, Total 12/11/2013 0.8   Final       Assessment/Plan  1. Essential hypertension High SBP. She will check BP at home and bring results to me in a couple of weeks.  2. Pain of hand, unspecified laterality Try Myoflex or Aspercreme  3. Abnormality of gait Walk daily  4. Depression Not depressed  5. Urge incontinence of urine Continue Vesicare  6. Insomnia Try Motrin PM

## 2013-12-31 ENCOUNTER — Telehealth: Payer: Self-pay | Admitting: *Deleted

## 2013-12-31 MED ORDER — METOPROLOL SUCCINATE ER 50 MG PO TB24: ORAL_TABLET | ORAL | Status: DC

## 2013-12-31 NOTE — Telephone Encounter (Signed)
Patient Notified and faxed Rx into pharmacy 

## 2013-12-31 NOTE — Telephone Encounter (Signed)
BP Readings:  Week 1 11/25  158/68 11/26  155/76 11/27  134/73 11/28  137/71 11/29  146/68  11/30  135/65  Week 2 12/1  140/67 12/2  150/68 12/3  136/71 12/4  139/61 12/5  141/61 12/6  140/68 12/7  143/67  Per Dr. Ysidro Evert Metoprolol Succinate to 50mg  once daily Tried calling patient on 12/9--No Answer

## 2014-01-07 ENCOUNTER — Telehealth: Payer: Self-pay | Admitting: *Deleted

## 2014-01-07 NOTE — Telephone Encounter (Signed)
Dr. Nyoka Cowden received patient's BP Readings  12/12  127/71            132/770  12/13  123/65  130/58  12/14  130/60  132/65  Per Dr. Ronnette Juniper BP looks fine. Continue 25mg  Metoprolol. Patient Notified and patient stated that she is taking 1/2 tablet of the 50mg  daily and will continue.

## 2014-01-09 ENCOUNTER — Other Ambulatory Visit: Payer: Self-pay | Admitting: *Deleted

## 2014-01-09 MED ORDER — METOPROLOL SUCCINATE ER 25 MG PO TB24: ORAL_TABLET | ORAL | Status: DC

## 2014-01-09 NOTE — Telephone Encounter (Signed)
[facility]  

## 2014-01-22 DIAGNOSIS — H903 Sensorineural hearing loss, bilateral: Secondary | ICD-10-CM | POA: Diagnosis not present

## 2014-01-22 DIAGNOSIS — H6123 Impacted cerumen, bilateral: Secondary | ICD-10-CM | POA: Diagnosis not present

## 2014-02-20 ENCOUNTER — Other Ambulatory Visit: Payer: Self-pay | Admitting: Internal Medicine

## 2014-02-23 DIAGNOSIS — M2042 Other hammer toe(s) (acquired), left foot: Secondary | ICD-10-CM | POA: Diagnosis not present

## 2014-02-23 DIAGNOSIS — M7150 Other bursitis, not elsewhere classified, unspecified site: Secondary | ICD-10-CM | POA: Diagnosis not present

## 2014-02-24 DIAGNOSIS — M2042 Other hammer toe(s) (acquired), left foot: Secondary | ICD-10-CM | POA: Diagnosis not present

## 2014-03-31 ENCOUNTER — Other Ambulatory Visit: Payer: Self-pay | Admitting: Internal Medicine

## 2014-06-03 ENCOUNTER — Other Ambulatory Visit: Payer: Self-pay | Admitting: Internal Medicine

## 2014-06-03 ENCOUNTER — Encounter: Payer: Self-pay | Admitting: Internal Medicine

## 2014-06-03 DIAGNOSIS — H9192 Unspecified hearing loss, left ear: Secondary | ICD-10-CM

## 2014-06-03 DIAGNOSIS — H9190 Unspecified hearing loss, unspecified ear: Secondary | ICD-10-CM | POA: Insufficient documentation

## 2014-06-16 ENCOUNTER — Encounter: Payer: Medicare Other | Admitting: Internal Medicine

## 2014-06-23 ENCOUNTER — Non-Acute Institutional Stay: Payer: Medicare Other | Admitting: Internal Medicine

## 2014-06-23 ENCOUNTER — Encounter: Payer: Self-pay | Admitting: Internal Medicine

## 2014-06-23 VITALS — BP 142/72 | HR 60 | Temp 97.5°F | Wt 122.0 lb

## 2014-06-23 DIAGNOSIS — I10 Essential (primary) hypertension: Secondary | ICD-10-CM | POA: Diagnosis not present

## 2014-06-23 DIAGNOSIS — F329 Major depressive disorder, single episode, unspecified: Secondary | ICD-10-CM

## 2014-06-23 DIAGNOSIS — F32A Depression, unspecified: Secondary | ICD-10-CM

## 2014-06-23 DIAGNOSIS — F411 Generalized anxiety disorder: Secondary | ICD-10-CM

## 2014-06-23 HISTORY — DX: Generalized anxiety disorder: F41.1

## 2014-06-23 NOTE — Progress Notes (Signed)
Patient ID: [name], female   DOB: 03/04/1917, 81 y.o.   MRN: [id number]    [facility]     Place of Service: Clinic (12)     Allergies  Allergen Reactions  . Ace Inhibitors   . Hydrochlorothiazide   . Morphine And Related     Chief Complaint  Patient presents with  . Medical Management of Chronic Issues    blood pressure, depression, anxiety    HPI:   1. Essential hypertension Adequately controlled. Honme BP range is around 120 SBP/ 70 DBP.  2. Depression meditates for calming effect  3. Generalized anxiety disorder improved   Medications: Patient's Medications  New Prescriptions   No medications on file  Previous Medications   AMLODIPINE (NORVASC) 10 MG TABLET    TAKE (1) TABLET DAILY FOR HIGH BLOOD PRESSURE.   CALCIUM CARBONATE-VITAMIN D (CALCIUM 600+D) 600-200 MG-UNIT TABS    Take by mouth. Take one daily for bones   IBUPROFEN (ADVIL,MOTRIN) 200 MG TABLET    Take 200 mg by mouth. Take one as needed   LOSARTAN (COZAAR) 100 MG TABLET    TAKE (1) TABLET DAILY FOR HIGH BLOOD PRESSURE.   METOPROLOL SUCCINATE (TOPROL-XL) 25 MG 24 HR TABLET    TAKE 1 TABLET ONCE DAILY TO CONTROL BLOOD PRESSURE.  Modified Medications   No medications on file  Discontinued Medications   VESICARE 5 MG TABLET    TAKE 1 TABLET ONCE DAILY TO HELP CONTROL BLADDER.     Review of Systems  Constitutional: Negative for activity change, appetite change and fatigue.  HENT: Positive for hearing loss. Negative for ear pain.   Eyes: Negative.   Respiratory: Negative.  Negative for shortness of breath.   Cardiovascular: Negative for chest pain, palpitations and leg swelling.       HTN  Gastrointestinal: Negative.   Endocrine: Negative.   Genitourinary:       Incontinence  Musculoskeletal: Positive for arthralgias (in feet).       Pain in both hands: R>L.  Skin: Negative.   Neurological: Negative.   Hematological: Negative.   Psychiatric/Behavioral: Positive for  sleep disturbance (Wakes at 3 AM. Full of energy then. Uses Motrin PM to get back to sleep. ). The patient is nervous/anxious.     Filed Vitals:   06/23/14 0944  BP: 142/72  Pulse: 60  Temp: 97.5 F (36.4 C)  TempSrc: Oral  Weight: 122 lb (55.339 kg)  SpO2: 96%   Body mass index is 24.63 kg/(m^2).  Physical Exam  Constitutional: She is oriented to person, place, and time. She appears well-developed and well-nourished. No distress.  HENT:  Head: Normocephalic and atraumatic.  Partial hearing loss.  Eyes: Conjunctivae and EOM are normal. Pupils are equal, round, and reactive to light.  Neck: Normal range of motion. Neck supple. No JVD present. No tracheal deviation present. No thyromegaly present.  Cardiovascular: Normal rate, regular rhythm and normal heart sounds.  Exam reveals no gallop and no friction rub.   No murmur heard. Pulmonary/Chest: No respiratory distress. She has no wheezes. She has no rales. She exhibits no tenderness.  Abdominal: Bowel sounds are normal. She exhibits no distension and no mass. There is no tenderness.  Musculoskeletal: Normal range of motion. She exhibits no edema or tenderness.  Pain in hands. Right 3rd trigger finger. Uses walker due to unstable gait. Left 2nd toe is deviated to the great toe and has elevated DIP joint that rubs on her shoe.  Lymphadenopathy:  She has no cervical adenopathy.  Neurological: She is alert and oriented to person, place, and time. No cranial nerve deficit. Coordination normal.  Skin: No rash noted. No erythema. No pallor.  Psychiatric: She has a normal mood and affect. Her behavior is normal. Judgment and thought content normal.     Labs reviewed: No visits with results within 3 Month(s) from this visit. Latest known visit with results is:  Nursing Home on 12/16/2013  Component Date Value Ref Range Status  . Glucose 12/11/2013 98   Final  . BUN 12/11/2013 17  4 - 21 mg/dL Final  . Creatinine 12/11/2013 0.8   0.5 - 1.1 mg/dL Final  . Potassium 12/11/2013 4.5  3.4 - 5.3 mmol/L Final  . Sodium 12/11/2013 135* 137 - 147 mmol/L Final  . LDl/HDL Ratio 12/11/2013 3.2   Final  . Triglycerides 12/11/2013 120  40 - 160 mg/dL Final  . Cholesterol 12/11/2013 188  0 - 200 mg/dL Final  . HDL 12/11/2013 58  35 - 70 mg/dL Final  . LDL Cholesterol 12/11/2013 106   Final  . Alkaline Phosphatase 12/11/2013 64  25 - 125 U/L Final  . ALT 12/11/2013 10  7 - 35 U/L Final  . AST 12/11/2013 16  13 - 35 U/L Final  . Bilirubin, Total 12/11/2013 0.8   Final     Assessment/Plan  1. Essential hypertension Adequately controlled  2. Depression Stable off medication  3. Generalized anxiety disorder Controlled  4. Arthritis in the feet Try Myoflex or Aspercreme.

## 2014-07-02 DIAGNOSIS — H903 Sensorineural hearing loss, bilateral: Secondary | ICD-10-CM | POA: Diagnosis not present

## 2014-09-25 ENCOUNTER — Other Ambulatory Visit: Payer: Self-pay | Admitting: Internal Medicine

## 2014-12-02 ENCOUNTER — Other Ambulatory Visit: Payer: Self-pay | Admitting: Internal Medicine

## 2014-12-07 ENCOUNTER — Ambulatory Visit (INDEPENDENT_AMBULATORY_CARE_PROVIDER_SITE_OTHER): Payer: Medicare Other

## 2014-12-07 ENCOUNTER — Ambulatory Visit (INDEPENDENT_AMBULATORY_CARE_PROVIDER_SITE_OTHER): Payer: Medicare Other | Admitting: Physician Assistant

## 2014-12-07 VITALS — BP 154/94 | HR 90 | Temp 98.4°F | Resp 17 | Ht 59.0 in | Wt 119.0 lb

## 2014-12-07 DIAGNOSIS — R6889 Other general symptoms and signs: Secondary | ICD-10-CM | POA: Diagnosis not present

## 2014-12-07 DIAGNOSIS — R410 Disorientation, unspecified: Secondary | ICD-10-CM

## 2014-12-07 LAB — COMPLETE METABOLIC PANEL WITH GFR
ALBUMIN: 4.4 g/dL (ref 3.6–5.1)
ALT: 14 U/L (ref 6–29)
AST: 21 U/L (ref 10–35)
Alkaline Phosphatase: 77 U/L (ref 33–130)
BILIRUBIN TOTAL: 0.6 mg/dL (ref 0.2–1.2)
BUN: 21 mg/dL (ref 7–25)
CALCIUM: 9.1 mg/dL (ref 8.6–10.4)
CO2: 25 mmol/L (ref 20–31)
CREATININE: 0.69 mg/dL (ref 0.60–0.88)
Chloride: 97 mmol/L — ABNORMAL LOW (ref 98–110)
GFR, EST AFRICAN AMERICAN: 86 mL/min (ref >=60)
GFR, Est Non African American: 74 mL/min (ref >=60)
Glucose, Bld: 118 mg/dL — ABNORMAL HIGH (ref 65–99)
Potassium: 3.6 mmol/L (ref 3.5–5.3)
Sodium: 136 mmol/L (ref 135–146)
TOTAL PROTEIN: 7.1 g/dL (ref 6.1–8.1)

## 2014-12-07 LAB — POC MICROSCOPIC URINALYSIS (UMFC): MUCUS RE: ABSENT

## 2014-12-07 LAB — POCT CBC
Granulocyte percent: 80.9 %G — AB (ref 37–80)
HCT, POC: 38.5 % (ref 37.7–47.9)
Hemoglobin: 13.3 g/dL (ref 12.2–16.2)
LYMPH, POC: 1.5 (ref 0.6–3.4)
MCH: 34 pg — AB (ref 27–31.2)
MCHC: 34.7 g/dL (ref 31.8–35.4)
MCV: 97.9 fL — AB (ref 80–97)
MID (CBC): 0.3 (ref 0–0.9)
MPV: 7 fL (ref 0–99.8)
PLATELET COUNT, POC: 390 10*3/uL (ref 142–424)
POC Granulocyte: 7.4 — AB (ref 2–6.9)
POC LYMPH %: 16 % (ref 10–50)
POC MID %: 3.1 % (ref 0–12)
RBC: 3.93 M/uL — AB (ref 4.04–5.48)
RDW, POC: 14.4 %
WBC: 9.1 10*3/uL (ref 4.6–10.2)

## 2014-12-07 LAB — POCT URINALYSIS DIP (MANUAL ENTRY)
BILIRUBIN UA: NEGATIVE
Blood, UA: NEGATIVE
GLUCOSE UA: NEGATIVE
Nitrite, UA: NEGATIVE
PH UA: 6
Spec Grav, UA: 1.015
Urobilinogen, UA: 1

## 2014-12-07 NOTE — Progress Notes (Signed)
12/07/2014 5:56 PM   DOB: [birth year] / MRN: [id number]  SUBJECTIVE:  [name] is a 81 y.o. female presenting for here today for an episode of feeling disoriented today.  Reports that she awoke and thought it was odd that "nobody was around."  She tried to use her phone and could no remember how to use it. She saw the nurse at her assisted living facility and the nurse felt that she may have a UTI.  Her daughter in law is with her today and reports that the nurse felt that she smelled or urine.  She denies any urinary problems today, along with cough.  Negative for posterior leg pain.  She has a history of HTN.    Daughter in law reports that she is behaving normally today, and has a base line confusion.    She is allergic to ace inhibitors; hydrochlorothiazide; and morphine and related.   She  has a past medical history of Unspecified hearing loss (08/22/2011); Anxiety state, unspecified (03/15/2010); Depressive disorder, not elsewhere classified (03/15/2010); Unspecified essential hypertension (03/15/2010); Unspecified constipation (03/15/2010); Senile osteoporosis (03/15/2010); Other alteration of consciousness (03/15/2010); Abnormality of gait (03/15/2010); Dysphagia, unspecified(787.20) (03/15/2010); Unspecified urinary incontinence (03/15/2010); Personal history of fall (03/15/2010); Herpes zoster without mention of complication ([id number]); Pain in hand (08/13/12); and Generalized anxiety disorder (06/23/2014).    She  reports that she has never smoked. She has never used smokeless tobacco. She reports that she drinks alcohol. She reports that she does not use illicit drugs. She  reports that she does not engage in sexual activity. The patient  has past surgical history that includes Spine surgery (12/2006) and Incision / drainage hand / finger (Right, 06/2008).  Her family history includes Heart disease in her father.  Review of Systems  Constitutional: Negative for fever and chills.  Eyes:  Negative.   Respiratory: Negative for cough.   Cardiovascular: Negative for chest pain.  Gastrointestinal: Negative for nausea, vomiting and diarrhea.  Genitourinary: Negative for dysuria.  Skin: Negative for rash.  Neurological: Negative for dizziness and headaches.    Problem list and medications reviewed and updated by myself where necessary, and exist elsewhere in the encounter.   OBJECTIVE:  BP 154/94 mmHg  Pulse 90  Temp(Src) 98.4 F (36.9 C) (Oral)  Resp 17  Ht 4\' 11"  (1.499 m)  Wt 119 lb (53.978 kg)  BMI 24.02 kg/m2  SpO2 98% CrCl cannot be calculated (Patient has no serum creatinine result on file.).  Physical Exam  Cardiovascular: Normal rate.   Pulmonary/Chest: Effort normal. She has rales (right lower lobe).  Neurological: She is alert. No cranial nerve deficit.  Psychiatric: Her mood appears not anxious. Her speech is tangential. She is slowed. She is not actively hallucinating. Thought content is not delusional. Cognition and memory are impaired. She does not exhibit a depressed mood. She is attentive.  Vitals reviewed.   Results for orders placed or performed in visit on 12/07/14 (from the past 48 hour(s))  POCT urinalysis dipstick     Status: Abnormal   Collection Time: 12/07/14  5:06 PM  Result Value Ref Range   Color, UA yellow yellow   Clarity, UA cloudy (A) clear   Glucose, UA negative negative   Bilirubin, UA negative negative   Ketones, POC UA trace (5) (A) negative   Spec Grav, UA 1.015    Blood, UA negative negative   pH, UA 6.0    Protein Ur, POC trace (A) negative   Urobilinogen, UA  1.0    Nitrite, UA Negative Negative   Leukocytes, UA Trace (A) Negative  POCT CBC     Status: Abnormal   Collection Time: 12/07/14  5:07 PM  Result Value Ref Range   WBC 9.1 4.6 - 10.2 K/uL   Lymph, poc 1.5 0.6 - 3.4   POC LYMPH PERCENT 16.0 10 - 50 %L   MID (cbc) 0.3 0 - 0.9   POC MID % 3.1 0 - 12 %M   POC Granulocyte 7.4 (A) 2 - 6.9   Granulocyte percent  80.9 (A) 37 - 80 %G   RBC 3.93 (A) 4.04 - 5.48 M/uL   Hemoglobin 13.3 12.2 - 16.2 g/dL   HCT, POC 38.5 37.7 - 47.9 %   MCV 97.9 (A) 80 - 97 fL   MCH, POC 34.0 (A) 27 - 31.2 pg   MCHC 34.7 31.8 - 35.4 g/dL   RDW, POC 14.4 %   Platelet Count, POC 390 142 - 424 K/uL   MPV 7.0 0 - 99.8 fL  POCT Microscopic Urinalysis ([facility])     Status: Abnormal   Collection Time: 12/07/14  5:15 PM  Result Value Ref Range   WBC,UR,HPF,POC None None WBC/hpf   RBC,UR,HPF,POC None None RBC/hpf   Bacteria Few (A) None, Too numerous to count   Mucus Absent Absent   Epithelial Cells, UR Per Microscopy Few (A) None, Too numerous to count cells/hpf   Amorphous large    [facility] reading (PRIMARY) by  PA Clark: Film appears rotated.  Cardiomegaly noted. No comparison available.  Patchy partial opacity noted in the right middle lobe. Please comment.    COMPARISON: 01/04/2010  FINDINGS: Enlargement of cardiac silhouette. Densely calcified and tortuous thoracic aorta. Mediastinal contours and pulmonary vascularity otherwise normal. Emphysematous and bronchitic changes consistent with COPD. Calcified granuloma LEFT lung. No definite acute infiltrate or pleural effusion. Linear opacity at the lateral RIGHT upper chest question scarring, faintly the evident on prior exam as well. No definite pneumothorax. Bones demineralized with evidence of marked compression deformity and prior spinal augmentation procedure at the thoracolumbar junction, unchanged.  IMPRESSION: COPD changes with probable RIGHT apical scarring.  ASSESSMENT AND PLAN:  Parnell was seen today for altered mental status and dysuria.  Diagnoses and all orders for this visit:  Disorientation: At this point I can find no indication for treatment and the risk of prophylactic abx in a 81 year old do not outweigh the benefits. She has no urinary symptoms, wounds, cough, or leg pain.  Will give this more time and culture urine.  Her right lower lobe  rales are most likely a representation of right lower lobe scarring as noted on radiograph.  -     POCT urinalysis dipstick -     POCT Microscopic Urinalysis ([facility]) -     Urine culture -     DG Chest 2 View; Future -     POCT CBC    The patient was advised to call or return to clinic if she does not see an improvement in symptoms or to seek the care of the closest emergency department if she worsens with the above plan.   Philis Fendt, MHS, PA-C [facility] 12/07/2014 5:56 PM

## 2014-12-10 ENCOUNTER — Observation Stay (HOSPITAL_COMMUNITY): Payer: Medicare Other

## 2014-12-10 ENCOUNTER — Encounter (HOSPITAL_COMMUNITY): Payer: Self-pay | Admitting: Emergency Medicine

## 2014-12-10 ENCOUNTER — Observation Stay (HOSPITAL_COMMUNITY)
Admission: EM | Admit: 2014-12-10 | Discharge: 2014-12-11 | Payer: Medicare Other | Attending: Internal Medicine | Admitting: Internal Medicine

## 2014-12-10 ENCOUNTER — Emergency Department (HOSPITAL_COMMUNITY): Payer: Medicare Other

## 2014-12-10 DIAGNOSIS — R4701 Aphasia: Secondary | ICD-10-CM | POA: Diagnosis not present

## 2014-12-10 DIAGNOSIS — R262 Difficulty in walking, not elsewhere classified: Secondary | ICD-10-CM | POA: Diagnosis not present

## 2014-12-10 DIAGNOSIS — R4789 Other speech disturbances: Secondary | ICD-10-CM | POA: Diagnosis not present

## 2014-12-10 DIAGNOSIS — G459 Transient cerebral ischemic attack, unspecified: Secondary | ICD-10-CM | POA: Diagnosis not present

## 2014-12-10 DIAGNOSIS — Z66 Do not resuscitate: Secondary | ICD-10-CM | POA: Diagnosis not present

## 2014-12-10 DIAGNOSIS — R531 Weakness: Secondary | ICD-10-CM | POA: Insufficient documentation

## 2014-12-10 DIAGNOSIS — Z79899 Other long term (current) drug therapy: Secondary | ICD-10-CM | POA: Insufficient documentation

## 2014-12-10 DIAGNOSIS — R404 Transient alteration of awareness: Secondary | ICD-10-CM | POA: Diagnosis not present

## 2014-12-10 DIAGNOSIS — D473 Essential (hemorrhagic) thrombocythemia: Secondary | ICD-10-CM | POA: Diagnosis not present

## 2014-12-10 DIAGNOSIS — E876 Hypokalemia: Secondary | ICD-10-CM | POA: Diagnosis present

## 2014-12-10 DIAGNOSIS — I10 Essential (primary) hypertension: Secondary | ICD-10-CM | POA: Diagnosis present

## 2014-12-10 DIAGNOSIS — M810 Age-related osteoporosis without current pathological fracture: Secondary | ICD-10-CM | POA: Diagnosis not present

## 2014-12-10 DIAGNOSIS — G319 Degenerative disease of nervous system, unspecified: Secondary | ICD-10-CM | POA: Insufficient documentation

## 2014-12-10 DIAGNOSIS — H9190 Unspecified hearing loss, unspecified ear: Secondary | ICD-10-CM

## 2014-12-10 DIAGNOSIS — R479 Unspecified speech disturbances: Secondary | ICD-10-CM

## 2014-12-10 LAB — APTT: APTT: 27 s (ref 24–37)

## 2014-12-10 LAB — URINALYSIS, ROUTINE W REFLEX MICROSCOPIC
Bilirubin Urine: NEGATIVE
Glucose, UA: NEGATIVE mg/dL
Hgb urine dipstick: NEGATIVE
KETONES UR: NEGATIVE mg/dL
LEUKOCYTES UA: NEGATIVE
NITRITE: NEGATIVE
PROTEIN: NEGATIVE mg/dL
Specific Gravity, Urine: 1.009 (ref 1.005–1.030)
pH: 6.5 (ref 5.0–8.0)

## 2014-12-10 LAB — COMPREHENSIVE METABOLIC PANEL
ALT: 19 U/L (ref 14–54)
AST: 24 U/L (ref 15–41)
Albumin: 5 g/dL (ref 3.5–5.0)
Alkaline Phosphatase: 76 U/L (ref 38–126)
Anion gap: 12 (ref 5–15)
BUN: 17 mg/dL (ref 6–20)
CHLORIDE: 101 mmol/L (ref 101–111)
CO2: 25 mmol/L (ref 22–32)
Calcium: 9.2 mg/dL (ref 8.9–10.3)
Creatinine, Ser: 0.8 mg/dL (ref 0.44–1.00)
Glucose, Bld: 92 mg/dL (ref 65–99)
POTASSIUM: 3.2 mmol/L — AB (ref 3.5–5.1)
Sodium: 138 mmol/L (ref 135–145)
Total Bilirubin: 0.8 mg/dL (ref 0.3–1.2)
Total Protein: 7.9 g/dL (ref 6.5–8.1)

## 2014-12-10 LAB — DIFFERENTIAL
BASOS ABS: 0 10*3/uL (ref 0.0–0.1)
BASOS PCT: 0 %
EOS ABS: 0.1 10*3/uL (ref 0.0–0.7)
Eosinophils Relative: 2 %
Lymphocytes Relative: 29 %
Lymphs Abs: 2.5 10*3/uL (ref 0.7–4.0)
MONOS PCT: 10 %
Monocytes Absolute: 0.8 10*3/uL (ref 0.1–1.0)
Neutro Abs: 4.9 10*3/uL (ref 1.7–7.7)
Neutrophils Relative %: 59 %

## 2014-12-10 LAB — CBC
HEMATOCRIT: 41.3 % (ref 36.0–46.0)
Hemoglobin: 14.2 g/dL (ref 12.0–15.0)
MCH: 34.3 pg — ABNORMAL HIGH (ref 26.0–34.0)
MCHC: 34.4 g/dL (ref 30.0–36.0)
MCV: 99.8 fL (ref 78.0–100.0)
PLATELETS: 438 10*3/uL — AB (ref 150–400)
RBC: 4.14 MIL/uL (ref 3.87–5.11)
RDW: 13.6 % (ref 11.5–15.5)
WBC: 8.4 10*3/uL (ref 4.0–10.5)

## 2014-12-10 LAB — I-STAT TROPONIN, ED: TROPONIN I, POC: 0.02 ng/mL (ref 0.00–0.08)

## 2014-12-10 LAB — URINE CULTURE: Colony Count: 80000

## 2014-12-10 LAB — PROTIME-INR
INR: 1.01 (ref 0.00–1.49)
PROTHROMBIN TIME: 13.5 s (ref 11.6–15.2)

## 2014-12-10 MED ORDER — ENOXAPARIN SODIUM 40 MG/0.4ML ~~LOC~~ SOLN
40.0000 mg | SUBCUTANEOUS | Status: DC
Start: 2014-12-10 — End: 2014-12-11
  Administered 2014-12-10: 40 mg via SUBCUTANEOUS
  Filled 2014-12-10: qty 0.4

## 2014-12-10 MED ORDER — AMLODIPINE BESYLATE 10 MG PO TABS
10.0000 mg | ORAL_TABLET | Freq: Every evening | ORAL | Status: DC
  Administered 2014-12-10: 10 mg via ORAL
  Filled 2014-12-10: qty 1

## 2014-12-10 MED ORDER — STROKE: EARLY STAGES OF RECOVERY BOOK
Freq: Once | Status: AC
  Administered 2014-12-10: 22:00:00
  Filled 2014-12-10: qty 1

## 2014-12-10 MED ORDER — POTASSIUM CHLORIDE CRYS ER 20 MEQ PO TBCR
40.0000 meq | EXTENDED_RELEASE_TABLET | Freq: Once | ORAL | Status: AC
  Administered 2014-12-10: 40 meq via ORAL
  Filled 2014-12-10: qty 2

## 2014-12-10 MED ORDER — ASPIRIN 300 MG RE SUPP
300.0000 mg | Freq: Every day | RECTAL | Status: DC
Start: 2014-12-10 — End: 2014-12-11
  Filled 2014-12-10 (×2): qty 1

## 2014-12-10 MED ORDER — IBUPROFEN 200 MG PO TABS: 400.0000 mg | ORAL_TABLET | Freq: Four times a day (QID) | ORAL | Status: DC | PRN

## 2014-12-10 MED ORDER — ASPIRIN 325 MG PO TABS
325.0000 mg | ORAL_TABLET | Freq: Every day | ORAL | Status: DC
  Administered 2014-12-11: 325 mg via ORAL
  Filled 2014-12-10: qty 1

## 2014-12-10 MED ORDER — METOPROLOL SUCCINATE ER 25 MG PO TB24
25.0000 mg | ORAL_TABLET | Freq: Every day | ORAL | Status: DC
  Administered 2014-12-10: 25 mg via ORAL
  Filled 2014-12-10: qty 1

## 2014-12-10 MED ORDER — LOSARTAN POTASSIUM 50 MG PO TABS
100.0000 mg | ORAL_TABLET | Freq: Every day | ORAL | Status: DC
  Administered 2014-12-10: 100 mg via ORAL
  Filled 2014-12-10: qty 2

## 2014-12-10 NOTE — ED Notes (Signed)
Pt states she felt tired while folding laundry today.  States she has had episodes of aphasia earlier this week and a hx of aphasia several times in the past few years. Pt arrived to ED alert and oriented and without problems communicating, answering questions appropriately and without difficulty.

## 2014-12-10 NOTE — ED Provider Notes (Signed)
CSN: [id number]     Arrival date & time 12/10/14  1358 History   First MD Initiated Contact with Patient 12/10/14 1504     Chief Complaint  Patient presents with  . Fatigue     (Consider location/radiation/quality/duration/timing/severity/associated sxs/prior Treatment) HPI Comments: Patient is a 81 year old female with a history of hypertension who is coming in today for complaint of aphasia, difficulty walking that lasted approximately 20 minutes today. She states this is happened before but can't recall the exact last episode. She has had several episodes in the last year. She has never seen a Dr. To be evaluated for these episodes but someone saw her today and they made her come. She states that sometimes her right leg feels very tired during these episodes but denies numbness or weakness of the right hand. She's had some intermittent problems with her vision as well as difficulty reading. She states she can see the words on the page but they don't make any sense is she just stopped reading the paper. She denies chest pain, shortness of breath, nausea, vomiting, urinary problems or abdominal pain. No recent falls.  The history is provided by the patient.    Past Medical History  Diagnosis Date  . Unspecified hearing loss 08/22/2011  . Anxiety state, unspecified 03/15/2010  . Depressive disorder, not elsewhere classified 03/15/2010  . Unspecified essential hypertension 03/15/2010  . Unspecified constipation 03/15/2010  . Senile osteoporosis 03/15/2010  . Other alteration of consciousness 03/15/2010  . Abnormality of gait 03/15/2010  . Dysphagia, unspecified(787.20) 03/15/2010  . Unspecified urinary incontinence 03/15/2010  . Personal history of fall 03/15/2010  . Herpes zoster without mention of complication [id number]  . Pain in hand 08/13/12  . Generalized anxiety disorder 06/23/2014   Past Surgical History  Procedure Laterality Date  . Spine surgery  12/2006  . Incision / drainage hand /  finger Right 06/2008    hand   Family History  Problem Relation Age of Onset  . Heart disease Father     MI   Social History  Substance Use Topics  . Smoking status: Never Smoker   . Smokeless tobacco: Never Used  . Alcohol Use: Yes     Comment: one glass wine at dinner occasionally   OB History    No data available     Review of Systems  All other systems reviewed and are negative.     Allergies  Ace inhibitors; Hydrochlorothiazide; and Morphine and related  Home Medications   Prior to Admission medications   Medication Sig Start Date End Date Taking? Authorizing Provider  amLODipine (NORVASC) 10 MG tablet TAKE (1) TABLET DAILY FOR HIGH BLOOD PRESSURE. 12/02/14  Yes Estill Dooms, MD  Calcium Carbonate-Vitamin D (CALCIUM 600+D) 600-200 MG-UNIT TABS Take 1 tablet by mouth daily.    Yes Historical Provider, MD  ibuprofen (ADVIL,MOTRIN) 200 MG tablet Take 400 mg by mouth every 6 (six) hours as needed (For headache, pain or fever.). Take one as needed   Yes Historical Provider, MD  losartan (COZAAR) 100 MG tablet TAKE (1) TABLET DAILY FOR HIGH BLOOD PRESSURE. 12/02/14  Yes Estill Dooms, MD  metoprolol succinate (TOPROL-XL) 25 MG 24 hr tablet TAKE 1 TABLET ONCE DAILY TO CONTROL BLOOD PRESSURE. 12/02/14  Yes Estill Dooms, MD  trolamine salicylate (ASPERCREME) 10 % cream Apply 1 application topically 4 (four) times daily as needed for muscle pain.   Yes Historical Provider, MD   BP 176/61 mmHg  Pulse 76  Temp(Src)  97.9 F (36.6 C) (Oral)  Resp 20  SpO2 100% Physical Exam  Constitutional: She is oriented to person, place, and time. She appears well-developed and well-nourished. No distress.  HENT:  Head: Normocephalic and atraumatic.  Mouth/Throat: Oropharynx is clear and moist.  Eyes: Conjunctivae and EOM are normal. Pupils are equal, round, and reactive to light.  Neck: Normal range of motion. Neck supple. Carotid bruit is not present.  Cardiovascular: Normal rate,  regular rhythm and intact distal pulses.   No murmur heard. Pulmonary/Chest: Effort normal and breath sounds normal. No respiratory distress. She has no wheezes. She has no rales.  Abdominal: Soft. She exhibits no distension. There is no tenderness. There is no rebound and no guarding.  Musculoskeletal: Normal range of motion. She exhibits no edema or tenderness.  Neurological: She is alert and oriented to person, place, and time.  Heel-to-shin normal on the left a little slower on the right but still purposeful.  4/5 strength in the right leg is 5 out of 5 on the left. Normal bilateral hand grip and upper extremity strength. No facial drooping. No visual field cuts. No pronator drift.  Skin: Skin is warm and dry. No rash noted. No erythema.  Psychiatric: She has a normal mood and affect. Her behavior is normal.  Nursing note and vitals reviewed.   ED Course  Procedures (including critical care time) Labs Review Labs Reviewed  CBC - Abnormal; Notable for the following:    MCH 34.3 (*)    Platelets 438 (*)    All other components within normal limits  COMPREHENSIVE METABOLIC PANEL - Abnormal; Notable for the following:    Potassium 3.2 (*)    All other components within normal limits  URINALYSIS, ROUTINE W REFLEX MICROSCOPIC (NOT AT [facility]) - Abnormal; Notable for the following:    APPearance CLOUDY (*)    All other components within normal limits  PROTIME-INR  APTT  DIFFERENTIAL  Randolm Idol, ED    Imaging Review Dg Chest 2 View  12/10/2014  CLINICAL DATA:  Episode of the a aphasia earlier this week. Sudden onset weakness. EXAM: CHEST - 2 VIEW COMPARISON:  Two-view chest x-ray 12/07/2014 FINDINGS: The heart is enlarged. Atherosclerotic calcifications are again noted at the aortic arch. A granuloma in the left lung is stable. There is no edema or effusion to suggest failure. Multilevel degenerative changes are noted in the thoracic spine. Right apical scarring is stable.  IMPRESSION: 1. Stable cardiomegaly without failure. 2. Atherosclerosis. 3. No acute cardiopulmonary disease. Electronically Signed   By: San Morelle M.D.   On: 12/10/2014 15:57   Ct Head Wo Contrast  12/10/2014  CLINICAL DATA:  Speech and gait problems. EXAM: CT HEAD WITHOUT CONTRAST TECHNIQUE: Contiguous axial images were obtained from the base of the skull through the vertex without intravenous contrast. COMPARISON:  CT head 01/04/2010 FINDINGS: Moderate to advanced atrophy. Moderate chronic microvascular ischemic change in the white matter. Negative for acute infarct.  Negative for hemorrhage or mass. Extensive atherosclerotic calcification. Negative calvarium. IMPRESSION: Moderate to advanced atrophy and chronic microvascular ischemia. No acute abnormality. Electronically Signed   By: Franchot Gallo M.D.   On: 12/10/2014 16:08   I have personally reviewed and evaluated these images and lab results as part of my medical decision-making.   EKG Interpretation   Date/Time:  Thursday December 10 2014 14:24:06 EST Ventricular Rate:  80 PR Interval:  103 QRS Duration: 82 QT Interval:  425 QTC Calculation: 490 R Axis:   -6  Text Interpretation:  Sinus rhythm Multiple premature complexes, vent &  supraven Short PR interval Inferior infarct, old Anteroseptal infarct, age  indeterminate No significant change since last tracing Confirmed by  [facility]  MD, Julianny Milstein ([id number]) on 12/10/2014 3:34:36 PM      MDM   Final diagnoses:  Transient cerebral ischemia, unspecified transient cerebral ischemia type   Patient is a 81 year old female who lives independently and came in today because of symptoms concerning for a TIA. For 20 minutes today she had difficulty finding her words as well as difficulty walking. She states this has happened intermittently since last Christmas but today someone witnessed that and recommended she come to the ER. Currently she feels her normal self. She does have a  history of hypertension. She has never been evaluated for these episodes. She currently denies any swallowing difficulty, unilateral weakness, speech difficulty. However she discusses her symptoms she may be having some alexia and agraphia as well.  Stroke order set initiated. EKG without acute findings.  5:27 PM Labs without significant finding and CT without acute stroke. Patient will be admitted for a TIA workup.  Blanchie Dessert, MD 12/10/14 1728

## 2014-12-10 NOTE — ED Notes (Signed)
Pt can go to floor at 18:00 

## 2014-12-10 NOTE — H&P (Signed)
History and Physical  [name] [id number] DOB: 1917/07/18 DOA: 12/10/2014  Referring physician: Blanchie Dessert, EDP PCP: Estill Dooms, MD  Outpatient Specialists:  1. None known  Chief Complaint: Speech abnormality  HPI: [name] is a 81 y.o. female , single, a resident of independent living (friend's home [place]), ambulates with the help of a walker, history of HTN, difficulty hearing, anxiety, depression, presented to the [facility] ED on 12/10/14 with complaints of speech abnormalities. Patient is a limited and poor historian. History is obtained from patient and her daughter-in-law Ms. Merlyn Lot at bedside. Patient stated that at approximately 10 AM today, someone came to deliver her newspaper  to her room and when she went to the door to receive it, she noticed that she herself was speaking nonsensical. She was aware of this. She denied any associated slurred speech, facial twisting, asymmetric limb weakness, tingling or numbness, dizziness, lightheadedness, chest pain or palpitations. The person went to the front desk and reported these symptoms to the responsible people. They also apparently noticed some gait abnormality-not clear if she had weakness in any particular limb. Patient does state that she has chronic left leg weakness. Patient states that she has had similar speech difficulties intermittently for at least a year which last approximately 15-20 minutes and resolve spontaneously like the current episode also resolved within 20 minutes. As per daughter-in-law, 2 days back she was seen at urgent care center for hallucinations where she was seeing protestors in the facility. She was evaluated at urgent care center without any significant findings and was discharged back. She apparently has had prior episodes of hallucinations as well. She has an appointment to see a geriatric psychiatrist in early December. In the ED, CT head without acute findings.  As  per family, speech and mental status back to baseline. Hospitalist admission was requested for further evaluation.   Review of Systems: All systems reviewed and apart from history of presenting illness, are negative.  Past Medical History  Diagnosis Date  . Unspecified hearing loss 08/22/2011  . Anxiety state, unspecified 03/15/2010  . Depressive disorder, not elsewhere classified 03/15/2010  . Unspecified essential hypertension 03/15/2010  . Unspecified constipation 03/15/2010  . Senile osteoporosis 03/15/2010  . Other alteration of consciousness 03/15/2010  . Abnormality of gait 03/15/2010  . Dysphagia, unspecified(787.20) 03/15/2010  . Unspecified urinary incontinence 03/15/2010  . Personal history of fall 03/15/2010  . Herpes zoster without mention of complication [id number]  . Pain in hand 08/13/12  . Generalized anxiety disorder 06/23/2014   Past Surgical History  Procedure Laterality Date  . Spine surgery  12/2006  . Incision / drainage hand / finger Right 06/2008    hand   Social History:  reports that she has never smoked. She has never used smokeless tobacco. She reports that she drinks alcohol. She reports that she does not use illicit drugs. She states that she drinks a glass or 2 of wine a couple times a week. Rest as per history of presenting illness.   Allergies  Allergen Reactions  . Ace Inhibitors Other (See Comments)    Reaction unknown  . Hydrochlorothiazide Other (See Comments)    Reaction unknown  . Morphine And Related Other (See Comments)    Hallucinations    Family History  Problem Relation Age of Onset  . Heart disease Father     MI    Prior to Admission medications   Medication Sig Start Date End Date Taking?  Authorizing Provider  amLODipine (NORVASC) 10 MG tablet TAKE (1) TABLET DAILY FOR HIGH BLOOD PRESSURE. 12/02/14  Yes Estill Dooms, MD  Calcium Carbonate-Vitamin D (CALCIUM 600+D) 600-200 MG-UNIT TABS Take 1 tablet by mouth daily.    Yes Historical  Provider, MD  ibuprofen (ADVIL,MOTRIN) 200 MG tablet Take 400 mg by mouth every 6 (six) hours as needed (For headache, pain or fever.). Take one as needed   Yes Historical Provider, MD  losartan (COZAAR) 100 MG tablet TAKE (1) TABLET DAILY FOR HIGH BLOOD PRESSURE. 12/02/14  Yes Estill Dooms, MD  metoprolol succinate (TOPROL-XL) 25 MG 24 hr tablet TAKE 1 TABLET ONCE DAILY TO CONTROL BLOOD PRESSURE. 12/02/14  Yes Estill Dooms, MD  trolamine salicylate (ASPERCREME) 10 % cream Apply 1 application topically 4 (four) times daily as needed for muscle pain.   Yes Historical Provider, MD   Physical Exam: Filed Vitals:   12/10/14 1413 12/10/14 1416 12/10/14 1614 12/10/14 1619  BP: 176/61   165/81  Pulse: 76   90  Temp: 97.9 F (36.6 C)  98.2 F (36.8 C) 98.2 F (36.8 C)  TempSrc: Oral   Oral  Resp: 20   21  SpO2: 97% 100%  98%     General exam: Moderately built and nourished pleasant elderly female patient, lying comfortably supine on the gurney in no obvious distress.  Head, eyes and ENT: Nontraumatic and normocephalic. Pupils equally reacting to light and accommodation. Oral mucosa moist.  Neck: Supple. No JVD, carotid bruit or thyromegaly.  Lymphatics: No lymphadenopathy.  Respiratory system: Clear to auscultation. No increased work of breathing.  Cardiovascular system: S1 and S2 heard, RRR. No JVD, murmurs, gallops, clicks or pedal edema.  Gastrointestinal system: Abdomen is nondistended, soft and nontender. Normal bowel sounds heard. No organomegaly or masses appreciated.  Central nervous system: Alert and oriented 3 . No focal neurological deficits. No facial asymmetry or dysarthria appreciated. Patient does appear periodically mildly confused and not sure if this is due to hard of hearing or her baseline.   Extremities: Symmetric 5 x 5 power except left lower extremity grade 4 x 5 power . Peripheral pulses symmetrically felt.   Skin: No rashes or acute  findings.  Musculoskeletal system: Negative exam.  Psychiatry: Pleasant and cooperative.   Labs on Admission:  Basic Metabolic Panel:  Recent Labs Lab 12/07/14 1759 12/10/14 1546  NA 136 138  K 3.6 3.2*  CL 97* 101  CO2 25 25  GLUCOSE 118* 92  BUN 21 17  CREATININE 0.69 0.80  CALCIUM 9.1 9.2   Liver Function Tests:  Recent Labs Lab 12/07/14 1759 12/10/14 1546  AST 21 24  ALT 14 19  ALKPHOS 77 76  BILITOT 0.6 0.8  PROT 7.1 7.9  ALBUMIN 4.4 5.0   No results for input(s): LIPASE, AMYLASE in the last 168 hours. No results for input(s): AMMONIA in the last 168 hours. CBC:  Recent Labs Lab 12/07/14 1707 12/10/14 1546  WBC 9.1 8.4  NEUTROABS  --  4.9  HGB 13.3 14.2  HCT 38.5 41.3  MCV 97.9* 99.8  PLT  --  438*   Cardiac Enzymes: No results for input(s): CKTOTAL, CKMB, CKMBINDEX, TROPONINI in the last 168 hours.  BNP (last 3 results) No results for input(s): PROBNP in the last 8760 hours. CBG: No results for input(s): GLUCAP in the last 168 hours.  Radiological Exams on Admission: Dg Chest 2 View  12/10/2014  CLINICAL DATA:  Episode of the a aphasia  earlier this week. Sudden onset weakness. EXAM: CHEST - 2 VIEW COMPARISON:  Two-view chest x-ray 12/07/2014 FINDINGS: The heart is enlarged. Atherosclerotic calcifications are again noted at the aortic arch. A granuloma in the left lung is stable. There is no edema or effusion to suggest failure. Multilevel degenerative changes are noted in the thoracic spine. Right apical scarring is stable. IMPRESSION: 1. Stable cardiomegaly without failure. 2. Atherosclerosis. 3. No acute cardiopulmonary disease. Electronically Signed   By: San Morelle M.D.   On: 12/10/2014 15:57   Ct Head Wo Contrast  12/10/2014  CLINICAL DATA:  Speech and gait problems. EXAM: CT HEAD WITHOUT CONTRAST TECHNIQUE: Contiguous axial images were obtained from the base of the skull through the vertex without intravenous contrast.  COMPARISON:  CT head 01/04/2010 FINDINGS: Moderate to advanced atrophy. Moderate chronic microvascular ischemic change in the white matter. Negative for acute infarct.  Negative for hemorrhage or mass. Extensive atherosclerotic calcification. Negative calvarium. IMPRESSION: Moderate to advanced atrophy and chronic microvascular ischemia. No acute abnormality. Electronically Signed   By: Franchot Gallo M.D.   On: 12/10/2014 16:08    EKG: Independently reviewed. Poor quality EKG-baseline artifacts. Appears low voltage, sinus rhythm probably in the 50s with PACs, normal axis, Q waves in leads V1-3 and in no obvious acute abnormalities. QTC 490 ms. Will repeat.  Assessment/Plan Principal Problem:   TIA (transient ischemic attack) Active Problems:   Essential hypertension   Hearing loss   Hypokalemia  Possible TIA - Patient seemed to have an episode of expressive aphasia earlier this morning which lasted approximately 20 minutes and resolved spontaneously. Unsure if she had any monoparesis/hemiparesis because she does state that she has chronic left lower extremity weakness. She gives a long history of similar speech difficulties going back at least for a year. She has not been evaluated for this.  - DD for her speech difficulties include: TIA, rule out stroke, related to possible dementia and behavioral abnormalities (history of hallucinations), seizures (seems less likely) versus other etiologies.  - CT head negative for acute findings.  - Complete stroke workup including MRI brain, 2-D echo, carotid Dopplers, fasting lipids, hemoglobin A1c. Monitor on telemetry.  -  patient not on antiplatelets PTA: Start aspirin 325 MG daily after she passes out and stroke swallow screen. - PT, OT and ST evaluation.  Hypokalemia - Replace and follow.  Essential hypertension - Mildly uncontrolled. Allow for permissive hypertension. Hence we will continue nightly ARB and Toprol-XL but hold amlodipine until  a.m.  Possible dementia with behavioral abnormalities - May be complicated by hearing difficulties. Patient has had episodes of hallucinations and confusion. Has an appointment to see a geriatric psychiatrist in early December-advised to keep that appointment.   DVT prophylaxis: Lovenox Code Status: DO NOT RESUSCITATE-confirmed with patient in the presence of her daughter in law.   Family Communication: Discussed with daughter-in-law Ms. Merlyn Lot at bedside   Disposition Plan: DC home possibly 11/18 pending stroke work up.  Time spent: 60 minutes.  Vernell Leep, MD, FACP, FHM. [facility] Pager [phone]  If 7PM-7AM, please contact night-coverage [email or web address] Password [email or web address] 12/10/2014, 6:01 PM

## 2014-12-11 ENCOUNTER — Observation Stay (HOSPITAL_COMMUNITY): Payer: Medicare Other

## 2014-12-11 ENCOUNTER — Telehealth: Payer: Self-pay

## 2014-12-11 DIAGNOSIS — I10 Essential (primary) hypertension: Secondary | ICD-10-CM | POA: Diagnosis not present

## 2014-12-11 DIAGNOSIS — E876 Hypokalemia: Secondary | ICD-10-CM

## 2014-12-11 DIAGNOSIS — G459 Transient cerebral ischemic attack, unspecified: Secondary | ICD-10-CM | POA: Diagnosis not present

## 2014-12-11 DIAGNOSIS — R4701 Aphasia: Secondary | ICD-10-CM | POA: Diagnosis not present

## 2014-12-11 DIAGNOSIS — H9193 Unspecified hearing loss, bilateral: Secondary | ICD-10-CM | POA: Diagnosis not present

## 2014-12-11 DIAGNOSIS — R479 Unspecified speech disturbances: Secondary | ICD-10-CM

## 2014-12-11 DIAGNOSIS — G458 Other transient cerebral ischemic attacks and related syndromes: Secondary | ICD-10-CM

## 2014-12-11 LAB — LIPID PANEL
Cholesterol: 178 mg/dL (ref 0–200)
HDL: 60 mg/dL (ref >40)
LDL CALC: 97 mg/dL (ref 0–99)
Total CHOL/HDL Ratio: 3 RATIO
Triglycerides: 106 mg/dL (ref <150)
VLDL: 21 mg/dL (ref 0–40)

## 2014-12-11 MED ORDER — ASPIRIN EC 81 MG PO TBEC: 81.0000 mg | DELAYED_RELEASE_TABLET | Freq: Every day | ORAL | Status: DC

## 2014-12-11 MED ORDER — POTASSIUM CHLORIDE CRYS ER 20 MEQ PO TBCR
40.0000 meq | EXTENDED_RELEASE_TABLET | Freq: Once | ORAL | Status: AC
  Administered 2014-12-11: 40 meq via ORAL
  Filled 2014-12-11: qty 2

## 2014-12-11 NOTE — Telephone Encounter (Signed)
Message left on triage voicemail- Patient is currently in the ER, patient's daughter feels like patient needs a psych evaluation.   I called patient's daughter back, patient was being discharged from the ER when I called back. Scheduled follow-up with Dr.Green on Tuesday 12/15/14. Patient's daughter will further address concerns at that visit.

## 2014-12-11 NOTE — Progress Notes (Signed)
Discussed with patient and daughter discharge instructions, both verbalized agreement and understanding.  Patient's IV was discontinued with no complications.  Patient to go to [facility], daughter will transport, report called to facility to [place].  Patient will go down in wheelchair with all belongings.

## 2014-12-11 NOTE — Clinical Social Work Note (Signed)
Clinical Social Work Assessment  Patient Details  Name: [name] MRN: [id number] Date of Birth: October 23, 1917  Date of referral:  12/11/14               Reason for consult:  Discharge Planning                Permission sought to share information with:  Family Supports Permission granted to share information::  Yes, Verbal Permission Granted  Name::     Hunt Oris  Agency::     Relationship::  daughter  Contact Information:  [phone]  Housing/Transportation Living arrangements for the past 2 months:  [place] of Information:  Adult Children, Patient Patient Interpreter Needed:  None Criminal Activity/Legal Involvement Pertinent to Current Situation/Hospitalization:  No - Comment as needed Significant Relationships:  Adult Children Lives with:  Facility Resident Do you feel safe going back to the place where you live?  No (pt agreeable to [facility] for respite care) Need for family participation in patient care:     Care giving concerns:  Pt admitted from [place]. Pt family feels pt needs respite care at ALF upon discharge for close supervision until pt can follow up with PCP and psychiatrist as an outpatient.   Social Worker assessment / plan:  CSW received referral that pt admitted from [place]. Per chart pt alert and oriented to person and place. CSW received phone call from pt daughter, [name] who stated that she is on her way to hospital from [place]. CSW provided supportive listening as pt daughter discussed that she feels that pt need psych evaluation in hospital. CSW explained to pt daughter that CSW would have to discuss with MD and ask MD to speak with pt daughter regarding pt daughters concerns. Pt daughter agreeable to speak with MD. CSW discussed disposition with pt daughter who confirmed that she has been working with [facility] regarding pt  going to respite at ALF for a few days before returning to [place]. Pt daughter agreeable to CSW communicating with [facility] regarding plan.   CSW contacted [facility] who confirmed that facility can accept pt to ALF for respite care from hospital. CSW completed FL2 and sent clinicals to [place] received notification from pt daughter after initial conversation that pt daughter had arrived to hospital. Pt daughter states that she had spoken with MD after conversation with CSW and MD was going to have pt evaluated by psychiatrist while pt in the hospital, but now that pt daughter has arrived pt daughter states that pt is very eager to leave the hospital and pt daughter feels pt is better and hopeful that pt can discharge to [facility] today and pt daughter plans to have pt see PCP on Monday and make follow up outpatient psych appointment. MD was notified and MD spoke with pt daughter. Pt medically ready for discharge to [facility] with respite care today.   CSW facilitated pt discharge needs including contacting facility, faxing pt discharge information, confirming facility received discharge information and that facility can accept pt to ALF for respite care for a few days, discussing with pt and pt daughter at bedside, providing RN phone number to call report. Pt daughter transporting pt by private vehicle to [facility].   Pt daughter appreciative of CSW assistance.  No further social work needs identified at this time.  CSW signing off.   Employment status:  Retired Forensic scientist:  Medicare PT Recommendations:  No Follow Up Information / Referral to community resources:  Other (Comment Required) ([facility])  Patient/Family's Response to care:  Pt alert and oriented x 2. Pleasantly confused. Pt daughter actively involved in care. Pt daughter felt comfortable with plan for  pt to go to [facility] for respite care and once pt daughter arrived to hospital then pt daughter was comfortable with plan to have pt see a psychiatrist on the outpatient setting.   Patient/Family's Understanding of and Emotional Response to Diagnosis, Current Treatment, and Prognosis:  Discharge instructions were given to pt daughter by RN. Pt daughter understanding of plan to follow up with pt PCP who will assist with making referral to outpatient psychiatrist. Pt daughter coping appropriately with plan and pt is eager to get back to [facility].  Emotional Assessment Appearance:  Appears stated age Attitude/Demeanor/Rapport:  Other (pt appropriate; pleasantly confused) Affect (typically observed):  Appropriate Orientation:  Oriented to Self, Oriented to Place Alcohol / Substance use:  Not Applicable Psych involvement (Current and /or in the community):  No (Comment)  Discharge Needs  Concerns to be addressed:  Discharge Planning Concerns Readmission within the last 30 days:  No Current discharge risk:  None Barriers to Discharge:  No Barriers Identified   No further social work needs identified at this time. Pt discharge to [facility] for respite care via pt daughter in private vehicle.  Millie Shorb, [place], LCSW 12/11/2014, 5:00 PM [phone]

## 2014-12-11 NOTE — Discharge Instructions (Signed)
Transient Ischemic Attack °A transient ischemic attack (TIA) is a "warning stroke" that causes stroke-like symptoms. A TIA does not cause lasting damage to the brain. The symptoms of a TIA can happen fast and do not last long. It is important to know the symptoms of a TIA and what to do. This can help prevent stroke or death.  °HOME CARE  °· Take medicines only as told by your doctor. Make sure you understand all of the instructions. °· You may need to take aspirin or warfarin medicine. Warfarin needs to be taken exactly as told. °¨ Taking too much or too little warfarin is dangerous. Blood tests must be done as often as told by your doctor. A PT blood test measures how long it takes for blood to clot. Your PT is used to calculate another value called an INR. Your PT and INR help your doctor adjust your warfarin dosage. He or she will make sure you are taking the right amount. °¨ Food can cause problems with warfarin and affect the results of your blood tests. This is true for foods high in vitamin K. Eat the same amount of foods high in vitamin K each day. Foods high in vitamin K include spinach, kale, broccoli, cabbage, collard and turnip greens, Brussels sprouts, peas, cauliflower, seaweed, and parsley. Other foods high in vitamin K include beef and pork liver, green tea, and soybean oil. Eat the same amount of foods high in vitamin K each day. Avoid big changes in your diet. Tell your doctor before changing your diet. Talk to a food specialist (dietitian) if you have questions. °¨ Many medicines can cause problems with warfarin and affect your PT and INR. Tell your doctor about all medicines you take. This includes vitamins and dietary pills (supplements). Do not take or stop taking any prescribed or over-the-counter medicines unless your doctor tells you to. °¨ Warfarin can cause more bruising or bleeding. Hold pressure over any cuts for longer than normal. Talk to your doctor about other side effects of  warfarin. °¨ Avoid sports or activities that may cause injury or bleeding. °¨ Be careful when you shave, floss, or use sharp objects. °¨ Avoid or drink very little alcohol while taking warfarin. Tell your doctor if you change how much alcohol you drink. °¨ Tell your dentist and other doctors that you take warfarin before any procedures. °· Follow your diet program as told, if you are given one. °· Keep a healthy weight. °· Stay active. Try to get at least 30 minutes of activity on all or most days. °· Do not use any tobacco products, including cigarettes, chewing tobacco, or electronic cigarettes. If you need help quitting, ask your doctor. °· Limit alcohol intake to no more than 1 drink per day for nonpregnant women and 2 drinks per day for men. One drink equals 12 ounces of beer, 5 ounces of wine, or 1½ ounces of hard liquor. °· Do not abuse drugs. °· Keep your home safe so you do not fall. You can do this by: °¨ Putting grab bars in the bedroom and bathroom. °¨ Raising toilet seats. °¨ Putting a seat in the shower. °· Keep all follow-up visits as told by your doctor. This is important. °GET HELP IF: °· Your personality changes. °· You have trouble swallowing. °· You have double vision. °· You are dizzy. °· You have a fever. °GET HELP RIGHT AWAY IF:  °These symptoms may be an emergency. Do not wait to see if the   symptoms will go away. Get medical help right away. Call your local emergency services (911 in the [place] Do not drive yourself to the hospital. °· You have sudden weakness or lose feeling (go numb), especially on one side of the body. This can affect your: °¨ Face. °¨ Arm. °¨ Leg. °· You have sudden trouble walking. °· You have sudden trouble moving your arms or legs. °· You have sudden confusion. °· You have trouble talking. °· You have trouble understanding. °· You have sudden trouble seeing in one or both eyes. °· You lose your balance. °· Your movements are not smooth. °· You have a sudden, very bad  headache with no known cause. °· You have new chest pain. °· Your heartbeat is unsteady. °· You are partly or totally unaware of what is going on around you. °MAKE SURE YOU:  °· Understand these instructions. °· Will watch your condition. °· Will get help right away if you are not doing well or get worse. °  °This information is not intended to replace advice given to you by your health care provider. Make sure you discuss any questions you have with your health care provider. °  °Document Released: 10/19/2007 Document Revised: 01/30/2014 Document Reviewed: 04/16/2013 °Elsevier Interactive Patient Education ©2016 [employer]vier Inc. ° °

## 2014-12-11 NOTE — Evaluation (Signed)
Physical Therapy One Time Evaluation Patient Details Name: [name] MRN: [id number] DOB: May 19, 1917 Today's Date: 12/11/2014   History of Present Illness  81 y.o. female , single, a resident of independent living (friend's home [place]), ambulates with the help of a walker, history of HTN, difficulty hearing, anxiety, depression, presented to the [facility] ED on 12/10/14 with complaints of speech abnormalities.  Pt with possible dementia and scheduled to see geriatric psychiatrist per chart review.  Clinical Impression  Patient evaluated by Physical Therapy with no further acute PT needs identified. All education has been completed and the patient has no further questions.  Pt ambulated good distance with RW.  Pt reports attending exercises classes at ILF weekly.  Pt states she feels at her baseline and would like to d/c back to her apt.  Pt cognitively appropriate during entire session.  See below for any follow-up Physical Therapy or equipment needs. PT is signing off. Thank you for this referral.     Follow Up Recommendations No PT follow up    Equipment Recommendations  None recommended by PT    Recommendations for Other Services       Precautions / Restrictions Precautions Precautions: Fall      Mobility  Bed Mobility               General bed mobility comments: pt up in recliner on arrival  Transfers Overall transfer level: Needs assistance Equipment used: Rolling walker (2 wheeled) Transfers: Sit to/from Stand Sit to Stand: Supervision            Ambulation/Gait Ambulation/Gait assistance: Min guard;Supervision Ambulation Distance (Feet): 600 Feet Assistive device: Rolling walker (2 wheeled) Gait Pattern/deviations: Step-through pattern     General Gait Details: steady with RW, ambulated good distance, pt feels at baseline  Stairs            Wheelchair Mobility    Modified Rankin (Stroke Patients Only)       Balance Overall  balance assessment: History of Falls                                           Pertinent Vitals/Pain Pain Assessment: No/denies pain    Home Living       Type of Home: Independent living facility         Home Equipment: Walker - 2 wheels      Prior Function Level of Independence: Independent with assistive device(s)               Hand Dominance        Extremity/Trunk Assessment               Lower Extremity Assessment: LLE deficits/detail   LLE Deficits / Details: states L LE slightly weaker, no obvious difference with MMT between LEs, L lower leg with slight edema compared to R     Communication   Communication: No difficulties  Cognition Arousal/Alertness: Awake/alert Behavior During Therapy: WFL for tasks assessed/performed Overall Cognitive Status: Within Functional Limits for tasks assessed                      General Comments      Exercises        Assessment/Plan    PT Assessment Patent does not need any further PT services  PT Diagnosis Difficulty walking   PT Problem  List    PT Treatment Interventions     PT Goals (Current goals can be found in the Care Plan section) Acute Rehab PT Goals PT Goal Formulation: All assessment and education complete, DC therapy    Frequency     Barriers to discharge        Co-evaluation               End of Session   Activity Tolerance: Patient tolerated treatment well Patient left: in chair;with call bell/phone within reach;with chair alarm set      Functional Assessment Tool Used: clinical judgement Functional Limitation: Mobility: Walking and moving around Mobility: Walking and Moving Around Current Status [id number]): At least 1 percent but less than 20 percent impaired, limited or restricted Mobility: Walking and Moving Around Goal Status [phone]): At least 1 percent but less than 20 percent impaired, limited or restricted Mobility: Walking and Moving Around  Discharge Status [phone]): At least 1 percent but less than 20 percent impaired, limited or restricted    Time: [id number] PT Time Calculation (min) (ACUTE ONLY): 18 min   Charges:   PT Evaluation $Initial PT Evaluation Tier I: 1 Procedure     PT G Codes:   PT G-Codes **NOT FOR INPATIENT CLASS** Functional Assessment Tool Used: clinical judgement Functional Limitation: Mobility: Walking and moving around Mobility: Walking and Moving Around Current Status [id number]): At least 1 percent but less than 20 percent impaired, limited or restricted Mobility: Walking and Moving Around Goal Status [phone]): At least 1 percent but less than 20 percent impaired, limited or restricted Mobility: Walking and Moving Around Discharge Status [phone]): At least 1 percent but less than 20 percent impaired, limited or restricted    [name],[name] 12/11/2014, 1:47 PM Carmelia Bake, PT, DPT 12/11/2014 Pager: [phone]

## 2014-12-11 NOTE — Progress Notes (Signed)
Patient refused carotid Doppler wanted no more test to be completed. Spoke with Dr. Garwin Brothers and said to cancel the test.  Toma Copier, [place] 12/11/2014 9:57 AM

## 2014-12-11 NOTE — NC FL2 (Signed)
[place] LEVEL OF CARE SCREENING TOOL     IDENTIFICATION  Patient Name: [name] Birthdate: Jun 06, 1917 Sex: female Admission Date (Current Location): 12/10/2014  [facility] and [place] Number: Company secretary and Address:  [facility],  [place], [place]      Provider Number: [id number]  Attending Physician Name and Address:  Theodis Blaze, MD  Relative Name and Phone Number:  Hunt Oris [phone]    Current Level of Care: Hospital Recommended Level of Care: [place] Prior Approval Number:    Date Approved/Denied:   PASRR Number:    Discharge Plan: Other (Comment) (Assisted Living)    Current Diagnoses: Patient Active Problem List   Diagnosis Date Noted  . Speech abnormality   . TIA (transient ischemic attack) 12/10/2014  . Hypokalemia 12/10/2014  . Generalized anxiety disorder 06/23/2014  . Hearing loss 06/03/2014  . Insomnia 12/16/2013  . Trigger finger, acquired 03/04/2013  . Pain in hand   . Essential hypertension 03/15/2010  . Urinary incontinence 03/15/2010  . Abnormality of gait 03/15/2010  . Depression 03/15/2010    Orientation ACTIVITIES/SOCIAL BLADDER RESPIRATION    Self, Place  Active Continent Normal  BEHAVIORAL SYMPTOMS/MOOD NEUROLOGICAL BOWEL NUTRITION STATUS      Continent Diet (Low Sodium Heart Healthy)  PHYSICIAN VISITS COMMUNICATION OF NEEDS Height & Weight Skin    Verbally 5\' 2"  (157.5 cm) 116 lbs. Normal          AMBULATORY STATUS RESPIRATION    Supervision limited Normal      Personal Care Assistance Level of Assistance  Bathing, Feeding, Dressing Bathing Assistance: Limited assistance (supervision) Feeding assistance: Independent Dressing Assistance: Limited assistance (supervision)      Functional Limitations Info  Sight, Hearing, Speech Sight Info: Adequate Hearing Info: Impaired Speech Info: Adequate       SPECIAL CARE FACTORS FREQUENCY                       Additional Factors Info  Code Status, Allergies Code Status Info: DNR code status Allergies Info: Ace Inhibitors, Hydrochlorothiazide, Morphine And Related           Current Medications (12/11/2014): Current Facility-Administered Medications  Medication Dose Route Frequency Provider Last Rate Last Dose  . amLODipine (NORVASC) tablet 10 mg  10 mg Oral QPM Modena Jansky, MD   10 mg at 12/10/14 2054  . aspirin suppository 300 mg  300 mg Rectal Daily Modena Jansky, MD   300 mg at 12/11/14 [id number]   Or  . aspirin tablet 325 mg  325 mg Oral Daily Modena Jansky, MD   325 mg at 12/11/14 0932  . enoxaparin (LOVENOX) injection 40 mg  40 mg Subcutaneous Q24H Modena Jansky, MD   40 mg at 12/10/14 2055  . ibuprofen (ADVIL,MOTRIN) tablet 400 mg  400 mg Oral Q6H PRN Modena Jansky, MD      . losartan (COZAAR) tablet 100 mg  100 mg Oral q1800 Modena Jansky, MD   100 mg at 12/10/14 2054  . metoprolol succinate (TOPROL-XL) 24 hr tablet 25 mg  25 mg Oral q1800 Modena Jansky, MD   25 mg at 12/10/14 2053   Do not use this list as official medication orders. Please verify with discharge summary.  Discharge Medications:   Medication List    STOP taking these medications        trolamine salicylate 10 % cream  Commonly known as:  ASPERCREME      TAKE these medications        amLODipine 10 MG tablet  Commonly known as:  NORVASC  TAKE (1) TABLET DAILY FOR HIGH BLOOD PRESSURE.     aspirin EC 81 MG tablet  Take 1 tablet (81 mg total) by mouth daily.     CALCIUM 600+D 600-200 MG-UNIT Tabs  Generic drug:  Calcium Carbonate-Vitamin D  Take 1 tablet by mouth daily.     ibuprofen 200 MG tablet  Commonly known as:  ADVIL,MOTRIN  Take 400 mg by mouth every 6 (six) hours as needed (For headache, pain or fever.). Take one as needed     losartan 100 MG tablet  Commonly known as:  COZAAR  TAKE (1) TABLET DAILY FOR HIGH BLOOD PRESSURE.     metoprolol  succinate 25 MG 24 hr tablet  Commonly known as:  TOPROL-XL  TAKE 1 TABLET ONCE DAILY TO CONTROL BLOOD PRESSURE.        Relevant Imaging Results:  Relevant Lab Results:  Recent Labs    Additional Information    [place] Shaul A, LCSW

## 2014-12-11 NOTE — Progress Notes (Signed)
OT Cancellation Note  Patient Details Name: [name] MRN: [id number] DOB: [birth year]   Cancelled Treatment:    Reason Eval/Treat Not Completed: Other (comment).  Pt refused OT. She states I'm not going to see anyone else. She believes she is at baseline for ADLs. Rn reports she will be leaving today.  Will sign off.  Cheskel Silverio 12/11/2014, 1:19 PM  Lesle Chris, OTR/L [phone] 12/11/2014

## 2014-12-11 NOTE — Discharge Summary (Addendum)
Physician Discharge Summary  [name] [id number] DOB: 05/30/1917 DOA: 12/10/2014  PCP: Estill Dooms, MD  Admit date: 12/10/2014 Discharge date: 12/11/2014  Recommendations for Outpatient Follow-up:  1. Pt will need to follow up with PCP in 1-2 weeks post discharge 2. Pt need referral to psych  3. ? Code status, will leave as full code, if papers available at the facility, please address   Discharge Diagnoses:  Principal Problem:   TIA (transient ischemic attack) Active Problems:   Essential hypertension   Hearing loss   Hypokalemia  Discharge Condition: Stable  Diet recommendation: Heart healthy diet discussed in details   History of present illness:  81 y.o. Female, resident of independent living (friend's home [place]), ambulates with the help of a walker, history of HTN, difficulty hearing, anxiety, depression, presented to the [facility] ED on 12/10/14 with complaints of speech abnormalities. She denied any associated facial twisting, asymmetric limb weakness, tingling or numbness, dizziness, lightheadedness, chest pain or palpitations. In the ED, CT head without acute findings. Hospitalist admission was requested for further evaluation.  Hospital Course:  Possible TIA - Patient seemed to have an episode of expressive aphasia that lasted approximately 20 minutes and resolved spontaneously - CT and MRI head negative for acute findings, no stroke but severe brain atrophy was noted  - pt has refused carotid dopplers  - wants to be discharged   Hypokalemia - supplement   Essential hypertension - Allow for permissive hypertension. Hence we will continue nightly ARB and Toprol-XL but hold amlodipine until a.m.  Possible dementia with behavioral abnormalities - needs psych referral   Thrombocytosis - reactive, no intervention needed   Procedures/Studies: Dg Chest 2 View  12/10/2014  CLINICAL DATA:  Episode of the a aphasia earlier this week. Sudden  onset weakness. EXAM: CHEST - 2 VIEW COMPARISON:  Two-view chest x-ray 12/07/2014 FINDINGS: The heart is enlarged. Atherosclerotic calcifications are again noted at the aortic arch. A granuloma in the left lung is stable. There is no edema or effusion to suggest failure. Multilevel degenerative changes are noted in the thoracic spine. Right apical scarring is stable. IMPRESSION: 1. Stable cardiomegaly without failure. 2. Atherosclerosis. 3. No acute cardiopulmonary disease. Electronically Signed   By: San Morelle M.D.   On: 12/10/2014 15:57   Dg Chest 2 View  12/07/2014  CLINICAL DATA:  Disorientation, rales on exam, essential hypertension EXAM: CHEST  2 VIEW COMPARISON:  01/04/2010 FINDINGS: Enlargement of cardiac silhouette. Densely calcified and tortuous thoracic aorta. Mediastinal contours and pulmonary vascularity otherwise normal. Emphysematous and bronchitic changes consistent with COPD. Calcified granuloma LEFT lung. No definite acute infiltrate or pleural effusion. Linear opacity at the lateral RIGHT upper chest question scarring, faintly the evident on prior exam as well. No definite pneumothorax. Bones demineralized with evidence of marked compression deformity and prior spinal augmentation procedure at the thoracolumbar junction, unchanged. IMPRESSION: COPD changes with probable RIGHT apical scarring. Enlargement of cardiac silhouette with extensive aortic atherosclerotic calcification. Electronically Signed   By: Lavonia Dana M.D.   On: 12/07/2014 17:29   Ct Head Wo Contrast  12/10/2014  CLINICAL DATA:  Speech and gait problems. EXAM: CT HEAD WITHOUT CONTRAST TECHNIQUE: Contiguous axial images were obtained from the base of the skull through the vertex without intravenous contrast. COMPARISON:  CT head 01/04/2010 FINDINGS: Moderate to advanced atrophy. Moderate chronic microvascular ischemic change in the white matter. Negative for acute infarct.  Negative for hemorrhage or mass.  Extensive atherosclerotic calcification. Negative calvarium. IMPRESSION:  Moderate to advanced atrophy and chronic microvascular ischemia. No acute abnormality. Electronically Signed   By: Franchot Gallo M.D.   On: 12/10/2014 16:08   Mr Brain Wo Contrast  12/10/2014  CLINICAL DATA:  20 minute episode of aphasia and difficulty walking today. EXAM: MRI HEAD WITHOUT CONTRAST TECHNIQUE: Multiplanar, multiecho pulse sequences of the brain and surrounding structures were obtained without intravenous contrast. COMPARISON:  CT December 10, 2014 at 1604 hours FINDINGS: No reduced diffusion to suggest acute ischemia. No susceptibility artifact to suggest hemorrhage. Ventricles and sulci are are overall normal for patient's age, mild symmetric dilatation of the occipital horns likely on ex vacuo basis. Symmetric patchy to confluent supratentorial white matter T2 hyperintensities without midline shift, mass effect or mass lesions. No abnormal extra-axial fluid collections. Normal major intracranial vascular flow voids present at the skullbase. Paranasal sinuses and mastoid air cells are well aerated. Status post RIGHT ocular lens implant. Generalized bright T1 bone marrow signal compatible with osteopenia. No cerebellar tonsillar ectopia. Grade 1 C3-4 anterolisthesis, on degenerative basis. IMPRESSION: No acute intracranial process, specifically no acute ischemia. Chronic change including moderate to severe global brain atrophy and chronic small vessel ischemic disease. Electronically Signed   By: Elon Alas M.D.   On: 12/10/2014 21:00   Discharge Exam: Filed Vitals:   12/11/14 0445  BP: 130/54  Pulse: 56  Temp: 98.2 F (36.8 C)  Resp: 16   Filed Vitals:   12/10/14 2245 12/11/14 0045 12/11/14 0235 12/11/14 0445  BP: 139/69 130/53 128/51 130/54  Pulse: 61 55 54 56  Temp: 98 F (36.7 C) 98.4 F (36.9 C) 97.9 F (36.6 C) 98.2 F (36.8 C)  TempSrc: Oral Oral Oral Oral  Resp: 16 16 18 16   Height:       Weight:      SpO2: 96% 97% 95% 96%    General: Pt is alert, not in acute distress Cardiovascular: Regular rate and rhythm, no rubs, no gallops Respiratory: Clear to auscultation bilaterally, no wheezing Abdominal: Soft, non tender, non distended, bowel sounds +, no guarding  Discharge Instructions  Discharge Instructions    Diet - low sodium heart healthy    Complete by:  As directed      Increase activity slowly    Complete by:  As directed             Medication List    STOP taking these medications        trolamine salicylate 10 % cream  Commonly known as:  ASPERCREME      TAKE these medications        amLODipine 10 MG tablet  Commonly known as:  NORVASC  TAKE (1) TABLET DAILY FOR HIGH BLOOD PRESSURE.     aspirin EC 81 MG tablet  Take 1 tablet (81 mg total) by mouth daily.     CALCIUM 600+D 600-200 MG-UNIT Tabs  Generic drug:  Calcium Carbonate-Vitamin D  Take 1 tablet by mouth daily.     ibuprofen 200 MG tablet  Commonly known as:  ADVIL,MOTRIN  Take 400 mg by mouth every 6 (six) hours as needed (For headache, pain or fever.). Take one as needed     losartan 100 MG tablet  Commonly known as:  COZAAR  TAKE (1) TABLET DAILY FOR HIGH BLOOD PRESSURE.     metoprolol succinate 25 MG 24 hr tablet  Commonly known as:  TOPROL-XL  TAKE 1 TABLET ONCE DAILY TO CONTROL BLOOD PRESSURE.  Follow-up Information    Follow up with GREEN, Viviann Spare, MD.   Specialty:  Internal Medicine   Contact information:   [place] [id number] [phone]        The results of significant diagnostics from this hospitalization (including imaging, microbiology, ancillary and laboratory) are listed below for reference.     Microbiology: Recent Results (from the past 240 hour(s))  Urine culture     Status: None   Collection Time: 12/07/14  5:08 PM  Result Value Ref Range Status   Colony Count 80,000 COLONIES/ML  Final   Organism ID, Bacteria  AEROCOCCUS SPECIES  Final    Comment: Standardized susceptibility testing for this organism is not available.     Labs: Basic Metabolic Panel:  Recent Labs Lab 12/07/14 1759 12/10/14 1546  NA 136 138  K 3.6 3.2*  CL 97* 101  CO2 25 25  GLUCOSE 118* 92  BUN 21 17  CREATININE 0.69 0.80  CALCIUM 9.1 9.2   Liver Function Tests:  Recent Labs Lab 12/07/14 1759 12/10/14 1546  AST 21 24  ALT 14 19  ALKPHOS 77 76  BILITOT 0.6 0.8  PROT 7.1 7.9  ALBUMIN 4.4 5.0   CBC:  Recent Labs Lab 12/07/14 1707 12/10/14 1546  WBC 9.1 8.4  NEUTROABS  --  4.9  HGB 13.3 14.2  HCT 38.5 41.3  MCV 97.9* 99.8  PLT  --  438*   SIGNED: Time coordinating discharge: 30 minutes  MAGICK-MYERS, ISKRA, MD  [facility] 12/11/2014, 10:00 AM Pager [phone]  If 7PM-7AM, please contact night-coverage [email or web address] Password [email or web address]

## 2014-12-12 LAB — HEMOGLOBIN A1C
Hgb A1c MFr Bld: 5.6 % (ref 4.8–5.6)
Mean Plasma Glucose: 114 mg/dL

## 2014-12-15 ENCOUNTER — Encounter: Payer: Medicare Other | Admitting: Internal Medicine

## 2014-12-15 ENCOUNTER — Encounter: Payer: Self-pay | Admitting: Internal Medicine

## 2014-12-15 ENCOUNTER — Non-Acute Institutional Stay: Payer: Medicare Other | Admitting: Internal Medicine

## 2014-12-15 VITALS — BP 140/82 | HR 68 | Temp 97.9°F | Wt 114.0 lb

## 2014-12-15 DIAGNOSIS — I10 Essential (primary) hypertension: Secondary | ICD-10-CM | POA: Diagnosis not present

## 2014-12-15 DIAGNOSIS — G458 Other transient cerebral ischemic attacks and related syndromes: Secondary | ICD-10-CM

## 2014-12-15 DIAGNOSIS — F329 Major depressive disorder, single episode, unspecified: Secondary | ICD-10-CM

## 2014-12-15 DIAGNOSIS — F32A Depression, unspecified: Secondary | ICD-10-CM
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
## 2014-12-21 ENCOUNTER — Telehealth: Payer: Self-pay | Admitting: *Deleted

## 2014-12-21 NOTE — Telephone Encounter (Signed)
[name], daughter called and stated that she has concerns with patient's mental state. Stated that she has had several Episodes of confusions and Hullcinations. Admitted to hospital 2 weeks ago. Saw Dr. Nyoka Cowden last Tuesday and stated that you would handle her care. This morning she received a call from [facility] and they stated that patient had an emergency with Mental changes, stated that she showed up in the lobby and smelled of urine and stating that people was in her apartment. The son went over and calmed her down. Daughter would like for you to call her with your thoughts and discuss last appointment with you. Please Call #: [phone].

## 2014-12-22 ENCOUNTER — Encounter: Payer: Self-pay | Admitting: Internal Medicine

## 2014-12-22 ENCOUNTER — Non-Acute Institutional Stay: Payer: Medicare Other | Admitting: Internal Medicine

## 2014-12-22 VITALS — BP 140/70 | HR 64 | Temp 97.2°F | Resp 18 | Ht 62.0 in | Wt 114.6 lb

## 2014-12-22 DIAGNOSIS — F411 Generalized anxiety disorder: Secondary | ICD-10-CM

## 2014-12-22 DIAGNOSIS — F329 Major depressive disorder, single episode, unspecified: Secondary | ICD-10-CM | POA: Diagnosis not present

## 2014-12-22 DIAGNOSIS — F32A Depression, unspecified: Secondary | ICD-10-CM

## 2014-12-22 DIAGNOSIS — I10 Essential (primary) hypertension: Secondary | ICD-10-CM

## 2014-12-22 DIAGNOSIS — R443 Hallucinations, unspecified: Secondary | ICD-10-CM | POA: Insufficient documentation

## 2014-12-22 MED ORDER — RISPERIDONE 0.5 MG PO TABS: ORAL_TABLET | ORAL | Status: DC

## 2014-12-22 NOTE — Progress Notes (Signed)
Patient ID: [name], female   DOB: Jan 09, 1918, 81 y.o.   MRN: [id number]    [facility]     Place of Service: Clinic (12)     Allergies  Allergen Reactions  . Ace Inhibitors Other (See Comments)    Reaction unknown  . Hydrochlorothiazide Other (See Comments)    Reaction unknown  . Morphine And Related Other (See Comments)    Hallucinations    Chief Complaint  Patient presents with  . Acute Visit    hallucinations, mental changes, confusion    HPI:  Continued problems of hallucinations. Patient mentions the fact the wallpaper seems to shimmering change. She has had experience with seeing fairies. At one point in the past she had one that state a couple of weeks. More recently she has had children that talk to her and appear in her room unannounced. She has also had auditory hallucinations with hearing and organ employed.  None of this seems particularly scary or frightening to her. She does realize that these are unusual experiences. She feels that other people off ladder when she tells the stories of these various appearances.  Patient says that she is feeling better today. She denies any other physical problems. She claims that she is sleeping okay. She has a Actuary who stays with her during the day.  There was an episode recently that she wandered out of her room in the hallways.   Medications: Patient's Medications  New Prescriptions   No medications on file  Previous Medications   AMLODIPINE (NORVASC) 10 MG TABLET    TAKE (1) TABLET DAILY FOR HIGH BLOOD PRESSURE.   ASPIRIN EC 81 MG TABLET    Take 1 tablet (81 mg total) by mouth daily.   CALCIUM CARBONATE-VITAMIN D (CALCIUM 600+D) 600-200 MG-UNIT TABS    Take 1 tablet by mouth daily.    IBUPROFEN (ADVIL,MOTRIN) 200 MG TABLET    Take 400 mg by mouth every 6 (six) hours as needed (For headache, pain or fever.). Take one as needed   LOSARTAN (COZAAR) 100 MG TABLET    TAKE (1) TABLET DAILY FOR HIGH  BLOOD PRESSURE.   METOPROLOL SUCCINATE (TOPROL-XL) 25 MG 24 HR TABLET    TAKE 1 TABLET ONCE DAILY TO CONTROL BLOOD PRESSURE.  Modified Medications   No medications on file  Discontinued Medications   No medications on file     Review of Systems  Constitutional: Negative for activity change, appetite change and fatigue.  HENT: Positive for hearing loss. Negative for ear pain.   Eyes: Negative.   Respiratory: Negative.  Negative for shortness of breath.   Cardiovascular: Negative for chest pain, palpitations and leg swelling.       HTN  Gastrointestinal: Negative.   Endocrine: Negative.   Genitourinary:       Incontinence  Musculoskeletal: Positive for arthralgias (in feet).       Pain in both hands: R>L.  Skin: Negative.   Neurological: Negative.   Hematological: Negative.   Psychiatric/Behavioral: Positive for confusion and sleep disturbance (Wakes at 3 AM. Full of energy then. Uses Motrin PM to get back to sleep. ). The patient is nervous/anxious.     Filed Vitals:   12/22/14 1016  BP: 140/70  Pulse: 64  Temp: 97.2 F (36.2 C)  TempSrc: Oral  Resp: 18  Height: 5' 2"  (1.575 m)  Weight: 114 lb 9.6 oz (51.982 kg)  SpO2: 96%   Body mass index is 20.96 kg/(m^2).  Physical Exam  Constitutional: She is oriented to person, place, and time. She appears well-developed and well-nourished. No distress.  HENT:  Head: Normocephalic and atraumatic.  Partial hearing loss.  Eyes: Conjunctivae and EOM are normal. Pupils are equal, round, and reactive to light.  Neck: Normal range of motion. Neck supple. No JVD present. No tracheal deviation present. No thyromegaly present.  Cardiovascular: Normal rate, regular rhythm and normal heart sounds.  Exam reveals no gallop and no friction rub.   No murmur heard. Pulmonary/Chest: No respiratory distress. She has no wheezes. She has no rales. She exhibits no tenderness.  Abdominal: Bowel sounds are normal. She exhibits no distension and no  mass. There is no tenderness.  Musculoskeletal: Normal range of motion. She exhibits no edema or tenderness.  Pain in hands. Right 3rd trigger finger. Uses walker due to unstable gait. Left 2nd toe is deviated to the great toe and has elevated DIP joint that rubs on her shoe.  Lymphadenopathy:    She has no cervical adenopathy.  Neurological: She is alert and oriented to person, place, and time. No cranial nerve deficit. Coordination normal.  Skin: No rash noted. No erythema. No pallor.  Psychiatric: She has a normal mood and affect. Her behavior is normal. Judgment and thought content normal.     Labs reviewed: Lab Summary Latest Ref Rng 12/11/2014 12/10/2014 12/07/2014  Hemoglobin 12.0 - 15.0 g/dL (None) 14.2 13.3  Hematocrit 36.0 - 46.0 % (None) 41.3 38.5  White count 4.0 - 10.5 K/uL (None) 8.4 9.1  Platelet count 150 - 400 K/uL (None) 438(H) (None)  Sodium 135 - 145 mmol/L (None) 138 136  Potassium 3.5 - 5.1 mmol/L (None) 3.2(L) 3.6  Calcium 8.9 - 10.3 mg/dL (None) 9.2 9.1  Phosphorus - (None) (None) (None)  Creatinine 0.44 - 1.00 mg/dL (None) 0.80 0.69  AST 15 - 41 U/L (None) 24 21  Alk Phos 38 - 126 U/L (None) 76 77  Bilirubin 0.3 - 1.2 mg/dL (None) 0.8 0.6  Glucose 65 - 99 mg/dL (None) 92 118(H)  Cholesterol 0 - 200 mg/dL 178 (None) (None)  HDL cholesterol >40 mg/dL 60 (None) (None)  Triglycerides <150 mg/dL 106 (None) (None)  LDL Direct - (None) (None) (None)  LDL Calc 0 - 99 mg/dL 97 (None) (None)  Total protein 6.5 - 8.1 g/dL (None) 7.9 7.1  Albumin 3.5 - 5.0 g/dL (None) 5.0 4.4   Lab Results  Component Value Date   TSH 2.138 *Test methodology is 3rd generation TSH 05/27/2008   Lab Results  Component Value Date   BUN 17 12/10/2014   Lab Results  Component Value Date   HGBA1C 5.6 12/11/2014       Assessment/Plan  1. Hallucinations Psychiatry consult with Dr. Casimiro Needle - risperiDONE (RISPERDAL) 0.5 MG tablet; One twice daily to prevent hallucinations and  anxiety  Dispense: 60 tablet; Refill: 4  2. Generalized anxiety disorder Psychiatry consult with Dr. Casimiro Needle  3. Essential hypertension Controlled  4. Depression No evidence of depression at this time
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
## 2014-12-29 ENCOUNTER — Non-Acute Institutional Stay: Payer: Medicare Other | Admitting: Internal Medicine

## 2014-12-29 VITALS — BP 130/80 | HR 73 | Temp 97.4°F | Resp 20 | Ht 62.0 in | Wt 116.8 lb

## 2014-12-29 DIAGNOSIS — R443 Hallucinations, unspecified: Secondary | ICD-10-CM | POA: Diagnosis not present

## 2014-12-29 DIAGNOSIS — R269 Unspecified abnormalities of gait and mobility: Secondary | ICD-10-CM

## 2014-12-29 DIAGNOSIS — I10 Essential (primary) hypertension: Secondary | ICD-10-CM

## 2014-12-29 NOTE — Progress Notes (Deleted)
Patient ID: [name], female   DOB: 10/29/1917, 81 y.o.   MRN: [id number]    [facility] (12[facility]     Allergies  Allergen Reactions  . Ace Inhibitors Other (See Comments)    Reaction unknown  . Hydrochlorothiazide Other (See Comments)    Reaction unknown  . Morphine And Related Other (See Comments)    Hallucinations    Chief Complaint  Patient presents with  . Medical Management of Chronic Issues    confused    HPI:  ***  Medications: Patient's Medications  New Prescriptions   No medications on file  Previous Medications   AMLODIPINE (NORVASC) 10 MG TABLET    TAKE (1) TABLET DAILY FOR HIGH BLOOD PRESSURE.   ASPIRIN EC 81 MG TABLET    Take 1 tablet (81 mg total) by mouth daily.   CALCIUM CARBONATE-VITAMIN D (CALCIUM 600+D) 600-200 MG-UNIT TABS    Take 1 tablet by mouth daily.    IBUPROFEN (ADVIL,MOTRIN) 200 MG TABLET    Take 400 mg by mouth every 6 (six) hours as needed (For headache, pain or fever.). Take one as needed   LOSARTAN (COZAAR) 100 MG TABLET    TAKE (1) TABLET DAILY FOR HIGH BLOOD PRESSURE.   METOPROLOL SUCCINATE (TOPROL-XL) 25 MG 24 HR TABLET    TAKE 1 TABLET ONCE DAILY TO CONTROL BLOOD PRESSURE.   RISPERIDONE (RISPERDAL) 0.5 MG TABLET    One twice daily to prevent hallucinations and anxiety  Modified Medications   No medications on file  Discontinued Medications   No medications on file     Review of Systems  Constitutional: Negative for activity change, appetite change and fatigue.  HENT: Positive for hearing loss. Negative for ear pain.   Eyes: Negative.   Respiratory: Negative.  Negative for shortness of breath.   Cardiovascular: Negative for chest pain, palpitations and leg swelling.       HTN  Gastrointestinal: Negative.   Endocrine: Negative.   Genitourinary:       Incontinence  Musculoskeletal: Positive for arthralgias (in feet).       Pain in both hands: R>L.  Skin: Negative.   Neurological:  Negative.   Hematological: Negative.   Psychiatric/Behavioral: Positive for confusion and sleep disturbance (Wakes at 3 AM. Full of energy then. Uses Motrin PM to get back to sleep. ). The patient is nervous/anxious.     Filed Vitals:   12/29/14 0942  BP: 130/80  Pulse: 73  Temp: 97.4 F (36.3 C)  TempSrc: Oral  Resp: 20  Height: 5' 2" (1.575 m)  Weight: 116 lb 12.8 oz (52.98 kg)  SpO2: 94%   Body mass index is 21.36 kg/(m^2).  Physical Exam  Constitutional: She is oriented to person, place, and time. She appears well-developed and well-nourished. No distress.  HENT:  Head: Normocephalic and atraumatic.  Partial hearing loss.  Eyes: Conjunctivae and EOM are normal. Pupils are equal, round, and reactive to light.  Neck: Normal range of motion. Neck supple. No JVD present. No tracheal deviation present. No thyromegaly present.  Cardiovascular: Normal rate, regular rhythm and normal heart sounds.  Exam reveals no gallop and no friction rub.   No murmur heard. Pulmonary/Chest: No respiratory distress. She has no wheezes. She has no rales. She exhibits no tenderness.  Abdominal: Bowel sounds are normal. She exhibits no distension and no mass. There is no tenderness.  Musculoskeletal: Normal range of motion. She exhibits no edema or   tenderness.  Pain in hands. Right 3rd trigger finger. Uses walker due to unstable gait. Left 2nd toe is deviated to the great toe and has elevated DIP joint that rubs on her shoe.  Lymphadenopathy:    She has no cervical adenopathy.  Neurological: She is alert and oriented to person, place, and time. No cranial nerve deficit. Coordination normal.  Skin: No rash noted. No erythema. No pallor.  Psychiatric: She has a normal mood and affect. Her behavior is normal. Judgment and thought content normal.     Labs reviewed: Lab Summary Latest Ref Rng 12/11/2014 12/10/2014 12/07/2014  Hemoglobin 12.0 - 15.0 g/dL (None) 14.2 13.3  Hematocrit 36.0 - 46.0 %  (None) 41.3 38.5  White count 4.0 - 10.5 K/uL (None) 8.4 9.1  Platelet count 150 - 400 K/uL (None) 438(H) (None)  Sodium 135 - 145 mmol/L (None) 138 136  Potassium 3.5 - 5.1 mmol/L (None) 3.2(L) 3.6  Calcium 8.9 - 10.3 mg/dL (None) 9.2 9.1  Phosphorus - (None) (None) (None)  Creatinine 0.44 - 1.00 mg/dL (None) 0.80 0.69  AST 15 - 41 U/L (None) 24 21  Alk Phos 38 - 126 U/L (None) 76 77  Bilirubin 0.3 - 1.2 mg/dL (None) 0.8 0.6  Glucose 65 - 99 mg/dL (None) 92 118(H)  Cholesterol 0 - 200 mg/dL 178 (None) (None)  HDL cholesterol >40 mg/dL 60 (None) (None)  Triglycerides <150 mg/dL 106 (None) (None)  LDL Direct - (None) (None) (None)  LDL Calc 0 - 99 mg/dL 97 (None) (None)  Total protein 6.5 - 8.1 g/dL (None) 7.9 7.1  Albumin 3.5 - 5.0 g/dL (None) 5.0 4.4   Lab Results  Component Value Date   TSH 2.138 ***Test methodology is 3rd generation TSH*** 05/27/2008   Lab Results  Component Value Date   BUN 17 12/10/2014   Lab Results  Component Value Date   HGBA1C 5.6 12/11/2014       Assessment/Plan     

## 2014-12-29 NOTE — Progress Notes (Signed)
Patient ID: [name], female   DOB: 27-Jan-1917, 81 y.o.   MRN: [id number]    HISTORY AND PHYSICAL  Location:  [place] Room Number: AL 8 Place of Service: Clinic (12)   Extended Emergency Contact Information Primary Emergency Contact: Peri [place] Mobile Phone: [phone] Relation: Daughter Secondary Emergency Contact: [place] Phone: [phone] Relation: Other  Advanced Directive information Does patient have an advance directive?: Yes, Type of Advance Directive: Healthcare Power of [place];Living will, Does patient want to make changes to advanced directive?: No - Patient declined  Chief Complaint  Patient presents with  . Medical Management of Chronic Issues    confused, new admission to AL at [facility]    HPI:  Patient was moved from independent living area and to the assisted living area of [facility] 12/28/2014. Patient was hospitalized 12/10/2014 through 12/11/2014 for a possible TIA. She had nonsensical speech. She improved during her brief hospital stay. Hospitalist felt that a brief episode of expressive aphasia that lasted approximate 3 minutes have been present. CT brain and MRI of the brain showed cerebral atrophy and advanced small vessel disease. There are no other significant abnormalities present during her hospital stay. She returned to her apartment following her hospitalization. She began having increasing problems of confusion associated with hallucinations that were both visual and auditory.  Patient was started on risperidone 0.5 mg twice daily last week. She seems to be tolerating this drug. Hallucinations may have diminished. There is no evidence of excessive sedation or further disturbance of gait. She was admitted to the assisted living area for protection due to her confusion.  Other chronic active diagnoses included hypertension, depression, osteoporosis, known gait disturbance for which  she uses a walker, mild dysphagia, and urinary incontinence.  Past Medical History  Diagnosis Date  . Unspecified hearing loss 08/22/2011  . Anxiety state, unspecified 03/15/2010  . Depressive disorder, not elsewhere classified 03/15/2010  . Unspecified essential hypertension 03/15/2010  . Unspecified constipation 03/15/2010  . Senile osteoporosis 03/15/2010  . Other alteration of consciousness 03/15/2010  . Abnormality of gait 03/15/2010  . Dysphagia, unspecified(787.20) 03/15/2010  . Unspecified urinary incontinence 03/15/2010  . Personal history of fall 03/15/2010  . Herpes zoster without mention of complication 05/25/7739  . Pain in hand 08/13/12  . Generalized anxiety disorder 06/23/2014    Past Surgical History  Procedure Laterality Date  . Spine surgery  12/2006  . Incision / drainage hand / finger Right 06/2008    hand    Patient Care Team: Estill Dooms, MD as PCP - General (Internal Medicine) [facility] Gaynelle Arabian, MD as Consulting Physician (Orthopedic Surgery)  Social History   Social History  . Marital Status: Divorced    Spouse Name: N/A  . Number of Children: N/A  . Years of Education: N/A   Occupational History  . [employer]    Social History Main Topics  . Smoking status: Never Smoker   . Smokeless tobacco: Never Used  . Alcohol Use: Yes     Comment: one glass wine at dinner occasionally  . Drug Use: No  . Sexual Activity: No   Other Topics Concern  . Not on file   Social History Narrative   Lives at [facility]   Never smoked   Exercise walks a lot, with walker   POA, Living Will    reports that she has never smoked.  She has never used smokeless tobacco. She reports that she drinks alcohol. She reports that she does not use illicit drugs.  Family History  Problem Relation Age of Onset  . Heart disease Father     MI   Family Status  Relation Status Death Age  . Mother Deceased 75    unknown "old age"    . Father Deceased 49  . Daughter Alive   . Son Alive   . Daughter Alive   . Son Alive     Immunization History  Administered Date(s) Administered  . Influenza Whole 10/24/2011, 10/24/2012  . Influenza-Unspecified 11/06/2013  . Pneumococcal Polysaccharide-23 01/23/2009  . Td 01/23/2009    Allergies  Allergen Reactions  . Ace Inhibitors Other (See Comments)    Reaction unknown  . Hydrochlorothiazide Other (See Comments)    Reaction unknown  . Morphine And Related Other (See Comments)    Hallucinations    Medications: Patient's Medications  New Prescriptions   No medications on file  Previous Medications   AMLODIPINE (NORVASC) 10 MG TABLET    TAKE (1) TABLET DAILY FOR HIGH BLOOD PRESSURE.   ASPIRIN EC 81 MG TABLET    Take 1 tablet (81 mg total) by mouth daily.   CALCIUM CARBONATE-VITAMIN D (CALCIUM 600+D) 600-200 MG-UNIT TABS    Take 1 tablet by mouth daily.    IBUPROFEN (ADVIL,MOTRIN) 200 MG TABLET    Take 400 mg by mouth every 6 (six) hours as needed (For headache, pain or fever.). Take one as needed   LOSARTAN (COZAAR) 100 MG TABLET    TAKE (1) TABLET DAILY FOR HIGH BLOOD PRESSURE.   METOPROLOL SUCCINATE (TOPROL-XL) 25 MG 24 HR TABLET    TAKE 1 TABLET ONCE DAILY TO CONTROL BLOOD PRESSURE.   RISPERIDONE (RISPERDAL) 0.5 MG TABLET    One twice daily to prevent hallucinations and anxiety  Modified Medications   No medications on file  Discontinued Medications   No medications on file    Review of Systems  Constitutional: Negative for activity change, appetite change and fatigue.  HENT: Positive for hearing loss. Negative for ear pain.   Eyes: Negative.   Respiratory: Negative.  Negative for shortness of breath.   Cardiovascular: Negative for chest pain, palpitations and leg swelling.       HTN  Gastrointestinal: Negative.   Endocrine: Negative.   Genitourinary:       Incontinence  Musculoskeletal: Positive for arthralgias (in feet).       Pain in both hands: R>L.   Skin: Negative.   Neurological: Negative.   Hematological: Negative.   Psychiatric/Behavioral: Positive for confusion and sleep disturbance (Wakes at 3 AM. Full of energy then. Uses Motrin PM to get back to sleep. ). The patient is nervous/anxious.     Filed Vitals:   12/29/14 0942  BP: 130/80  Pulse: 73  Temp: 97.4 F (36.3 C)  TempSrc: Oral  Resp: 20  Height: _0  (1.575 m)  Weight: 116 lb 12.8 oz (52.98 kg)  SpO2: 94%   Body mass index is 21.36 kg/(m^2).  Physical Exam  Constitutional: She is oriented to person, place, and time. She appears well-developed and well-nourished. No distress.  HENT:  Head: Normocephalic and atraumatic.  Partial hearing loss.  Eyes: Conjunctivae and EOM are normal. Pupils are equal, round, and reactive to light.  Neck: Normal range of motion. Neck supple. No JVD present. No tracheal deviation present. No thyromegaly present.  Cardiovascular: Normal rate, regular rhythm and normal heart sounds.  Exam reveals no gallop and no friction rub.   No murmur heard. Pulmonary/Chest: No respiratory distress. She has no wheezes. She has no rales. She exhibits no tenderness.  Abdominal: Bowel sounds are normal. She exhibits no distension and no mass. There is no tenderness.  Musculoskeletal: Normal range of motion. She exhibits no edema or tenderness.  Pain in hands. Right 3rd trigger finger. Uses walker due to unstable gait. Left 2nd toe is deviated to the great toe and has elevated DIP joint that rubs on her shoe.  Lymphadenopathy:    She has no cervical adenopathy.  Neurological: She is alert and oriented to person, place, and time. No cranial nerve deficit. Coordination normal.  12/29/14 MMSE 26/30 . Failed clock drawing.  Skin: No rash noted. No erythema. No pallor.  Psychiatric: She has a normal mood and affect. Her behavior is normal. Judgment and thought content normal.    Labs reviewed: Lab Summary Latest Ref Rng 12/11/2014 12/10/2014  12/07/2014  Hemoglobin 12.0 - 15.0 g/dL (None) 14.2 13.3  Hematocrit 36.0 - 46.0 % (None) 41.3 38.5  White count 4.0 - 10.5 K/uL (None) 8.4 9.1  Platelet count 150 - 400 K/uL (None) 438(H) (None)  Sodium 135 - 145 mmol/L (None) 138 136  Potassium 3.5 - 5.1 mmol/L (None) 3.2(L) 3.6  Calcium 8.9 - 10.3 mg/dL (None) 9.2 9.1  Phosphorus - (None) (None) (None)  Creatinine 0.44 - 1.00 mg/dL (None) 0.80 0.69  AST 15 - 41 U/L (None) 24 21  Alk Phos 38 - 126 U/L (None) 76 77  Bilirubin 0.3 - 1.2 mg/dL (None) 0.8 0.6  Glucose 65 - 99 mg/dL (None) 92 118(H)  Cholesterol 0 - 200 mg/dL 178 (None) (None)  HDL cholesterol >40 mg/dL 60 (None) (None)  Triglycerides <150 mg/dL 106 (None) (None)  LDL Direct - (None) (None) (None)  LDL Calc 0 - 99 mg/dL 97 (None) (None)  Total protein 6.5 - 8.1 g/dL (None) 7.9 7.1  Albumin 3.5 - 5.0 g/dL (None) 5.0 4.4   Lab Results  Component Value Date   BUN 17 12/10/2014   Lab Results  Component Value Date   HGBA1C 5.6 12/11/2014   Lab Results  Component Value Date   TSH 2.138 *Test methodology is 3rd generation TSH 05/27/2008          Dg Chest 2 View  12/10/2014  CLINICAL DATA:  Episode of the a aphasia earlier this week. Sudden onset weakness. EXAM: CHEST - 2 VIEW COMPARISON:  Two-view chest x-ray 12/07/2014 FINDINGS: The heart is enlarged. Atherosclerotic calcifications are again noted at the aortic arch. A granuloma in the left lung is stable. There is no edema or effusion to suggest failure. Multilevel degenerative changes are noted in the thoracic spine. Right apical scarring is stable. IMPRESSION: 1. Stable cardiomegaly without failure. 2. Atherosclerosis. 3. No acute cardiopulmonary disease. Electronically Signed   By: San Morelle M.D.   On: 12/10/2014 15:57   Dg Chest 2 View  12/07/2014  CLINICAL DATA:  Disorientation, rales on exam, essential hypertension EXAM: CHEST  2 VIEW COMPARISON:  01/04/2010 FINDINGS: Enlargement of cardiac  silhouette. Densely calcified and tortuous thoracic aorta. Mediastinal contours and pulmonary vascularity otherwise normal. Emphysematous and bronchitic changes consistent with COPD. Calcified granuloma LEFT lung. No definite acute infiltrate or pleural effusion. Linear opacity at the lateral RIGHT upper chest question scarring, faintly the evident on prior exam as well. No definite pneumothorax. Bones demineralized with evidence of marked compression deformity and prior spinal augmentation procedure  at the thoracolumbar junction, unchanged. IMPRESSION: COPD changes with probable RIGHT apical scarring. Enlargement of cardiac silhouette with extensive aortic atherosclerotic calcification. Electronically Signed   By: Lavonia Dana M.D.   On: 12/07/2014 17:29   Ct Head Wo Contrast  12/10/2014  CLINICAL DATA:  Speech and gait problems. EXAM: CT HEAD WITHOUT CONTRAST TECHNIQUE: Contiguous axial images were obtained from the base of the skull through the vertex without intravenous contrast. COMPARISON:  CT head 01/04/2010 FINDINGS: Moderate to advanced atrophy. Moderate chronic microvascular ischemic change in the white matter. Negative for acute infarct.  Negative for hemorrhage or mass. Extensive atherosclerotic calcification. Negative calvarium. IMPRESSION: Moderate to advanced atrophy and chronic microvascular ischemia. No acute abnormality. Electronically Signed   By: Franchot Gallo M.D.   On: 12/10/2014 16:08   Mr Brain Wo Contrast  12/10/2014  CLINICAL DATA:  20 minute episode of aphasia and difficulty walking today. EXAM: MRI HEAD WITHOUT CONTRAST TECHNIQUE: Multiplanar, multiecho pulse sequences of the brain and surrounding structures were obtained without intravenous contrast. COMPARISON:  CT December 10, 2014 at 1604 hours FINDINGS: No reduced diffusion to suggest acute ischemia. No susceptibility artifact to suggest hemorrhage. Ventricles and sulci are are overall normal for patient's age, mild symmetric  dilatation of the occipital horns likely on ex vacuo basis. Symmetric patchy to confluent supratentorial white matter T2 hyperintensities without midline shift, mass effect or mass lesions. No abnormal extra-axial fluid collections. Normal major intracranial vascular flow voids present at the skullbase. Paranasal sinuses and mastoid air cells are well aerated. Status post RIGHT ocular lens implant. Generalized bright T1 bone marrow signal compatible with osteopenia. No cerebellar tonsillar ectopia. Grade 1 C3-4 anterolisthesis, on degenerative basis. IMPRESSION: No acute intracranial process, specifically no acute ischemia. Chronic change including moderate to severe global brain atrophy and chronic small vessel ischemic disease. Electronically Signed   By: Elon Alas M.D.   On: 12/10/2014 21:00     Assessment/Plan  1. Hallucinations Continue risperidone 0.5 mg twice daily  2. Essential hypertension Continue losartan and metoprolol succinate  3. Abnormality of gait Continue use of walker.

## 2014-12-29 NOTE — Addendum Note (Signed)
Addended by: Estill Dooms on: 12/29/2014 02:42 PM   Modules accepted: Level of Service

## 2015-01-05 ENCOUNTER — Encounter: Payer: Self-pay | Admitting: Internal Medicine

## 2015-01-05 ENCOUNTER — Non-Acute Institutional Stay: Payer: Medicare Other | Admitting: Internal Medicine

## 2015-01-05 VITALS — BP 120/60 | HR 67 | Temp 97.3°F | Resp 20 | Ht 62.0 in | Wt 120.8 lb

## 2015-01-05 DIAGNOSIS — I10 Essential (primary) hypertension: Secondary | ICD-10-CM | POA: Diagnosis not present

## 2015-01-05 DIAGNOSIS — R443 Hallucinations, unspecified: Secondary | ICD-10-CM | POA: Diagnosis not present

## 2015-01-05 DIAGNOSIS — F329 Major depressive disorder, single episode, unspecified: Secondary | ICD-10-CM | POA: Diagnosis not present

## 2015-01-05 DIAGNOSIS — F32A Depression, unspecified: Secondary | ICD-10-CM

## 2015-01-05 NOTE — Progress Notes (Signed)
Patient ID: [name], female   DOB: 1917-07-06, 81 y.o.   MRN: [id number]    [place] Room Number: AL 8  Place of Service: Clinic (12)     Allergies  Allergen Reactions  . Ace Inhibitors Other (See Comments)    Reaction unknown  . Hydrochlorothiazide Other (See Comments)    Reaction unknown  . Morphine And Related Other (See Comments)    Hallucinations    Chief Complaint  Patient presents with  . Medical Management of Chronic Issues    2 week F/u BP, Hallucinations    HPI:  Patient is doing well presently. She has had no more hallucinations. Although she is mildly unsteady when walking, she has her walker, and this seems to be her chronic condition.  Due to frailty and memory issues, she seems so decided that she wants to stay permanently and the assisted-living area. She says that a dated 01/24/2015 shows no superficial permanently and date. She will continue to stay in the unit.  Hypertension is under good control. Her graft patient has no other significant complaints at this time.  Medications: Patient's Medications  New Prescriptions   No medications on file  Previous Medications   AMLODIPINE (NORVASC) 10 MG TABLET    TAKE (1) TABLET DAILY FOR HIGH BLOOD PRESSURE.   ASPIRIN EC 81 MG TABLET    Take 1 tablet (81 mg total) by mouth daily.   CALCIUM CARBONATE-VITAMIN D (CALCIUM 600+D) 600-200 MG-UNIT TABS    Take 1 tablet by mouth daily.    IBUPROFEN (ADVIL,MOTRIN) 200 MG TABLET    Take 400 mg by mouth every 6 (six) hours as needed (For headache, pain or fever.). Take one as needed   LOSARTAN (COZAAR) 100 MG TABLET    TAKE (1) TABLET DAILY FOR HIGH BLOOD PRESSURE.   METOPROLOL SUCCINATE (TOPROL-XL) 25 MG 24 HR TABLET    TAKE 1 TABLET ONCE DAILY TO CONTROL BLOOD PRESSURE.   RISPERIDONE (RISPERDAL) 0.5 MG TABLET    One twice daily to prevent hallucinations and anxiety  Modified Medications   No medications on file  Discontinued  Medications   No medications on file     Review of Systems  Constitutional: Negative for activity change, appetite change and fatigue.  HENT: Positive for hearing loss. Negative for ear pain.   Eyes: Negative.   Respiratory: Negative.  Negative for shortness of breath.   Cardiovascular: Negative for chest pain, palpitations and leg swelling.       HTN  Gastrointestinal: Negative.   Endocrine: Negative.   Genitourinary:       Incontinence  Musculoskeletal: Positive for arthralgias (in feet).       Pain in both hands: R>L.  Skin: Negative.   Neurological: Negative.   Hematological: Negative.   Psychiatric/Behavioral: Positive for confusion and sleep disturbance (Wakes at 3 AM. Full of energy then. Uses Motrin PM to get back to sleep. ). The patient is nervous/anxious.     Filed Vitals:   01/05/15 1108  BP: 120/60  Pulse: 67  Temp: 97.3 F (36.3 C)  TempSrc: Oral  Resp: 20  Height: _0  (1.575 m)  Weight: 120 lb 12.8 oz (54.795 kg)  SpO2: 99%   Body mass index is 22.09 kg/(m^2).  Physical Exam  Constitutional: She is oriented to person, place, and time. She appears well-developed and well-nourished. No distress.  HENT:  Head: Normocephalic and atraumatic.  Partial hearing loss.  Eyes: Conjunctivae and EOM are  normal. Pupils are equal, round, and reactive to light.  Neck: Normal range of motion. Neck supple. No JVD present. No tracheal deviation present. No thyromegaly present.  Cardiovascular: Normal rate, regular rhythm and normal heart sounds.  Exam reveals no gallop and no friction rub.   No murmur heard. Pulmonary/Chest: No respiratory distress. She has no wheezes. She has no rales. She exhibits no tenderness.  Abdominal: Bowel sounds are normal. She exhibits no distension and no mass. There is no tenderness.  Musculoskeletal: Normal range of motion. She exhibits no edema or tenderness.  Pain in hands. Right 3rd trigger finger. Uses walker due to unstable  gait. Left 2nd toe is deviated to the great toe and has elevated DIP joint that rubs on her shoe.  Lymphadenopathy:    She has no cervical adenopathy.  Neurological: She is alert and oriented to person, place, and time. No cranial nerve deficit. Coordination normal.  12/29/14 MMSE 26/30 . Failed clock drawing.  Skin: No rash noted. No erythema. No pallor.  Psychiatric: She has a normal mood and affect. Her behavior is normal. Judgment and thought content normal.     Labs reviewed: Lab Summary Latest Ref Rng 12/11/2014 12/10/2014 12/07/2014  Hemoglobin 12.0 - 15.0 g/dL (None) 14.2 13.3  Hematocrit 36.0 - 46.0 % (None) 41.3 38.5  White count 4.0 - 10.5 K/uL (None) 8.4 9.1  Platelet count 150 - 400 K/uL (None) 438(H) (None)  Sodium 135 - 145 mmol/L (None) 138 136  Potassium 3.5 - 5.1 mmol/L (None) 3.2(L) 3.6  Calcium 8.9 - 10.3 mg/dL (None) 9.2 9.1  Phosphorus - (None) (None) (None)  Creatinine 0.44 - 1.00 mg/dL (None) 0.80 0.69  AST 15 - 41 U/L (None) 24 21  Alk Phos 38 - 126 U/L (None) 76 77  Bilirubin 0.3 - 1.2 mg/dL (None) 0.8 0.6  Glucose 65 - 99 mg/dL (None) 92 118(H)  Cholesterol 0 - 200 mg/dL 178 (None) (None)  HDL cholesterol >40 mg/dL 60 (None) (None)  Triglycerides <150 mg/dL 106 (None) (None)  LDL Direct - (None) (None) (None)  LDL Calc 0 - 99 mg/dL 97 (None) (None)  Total protein 6.5 - 8.1 g/dL (None) 7.9 7.1  Albumin 3.5 - 5.0 g/dL (None) 5.0 4.4   Lab Results  Component Value Date   TSH 2.138 *Test methodology is 3rd generation TSH* 05/27/2008   Lab Results  Component Value Date   BUN 17 12/10/2014   Lab Results  Component Value Date   HGBA1C 5.6 12/11/2014       Assessment/Plan  1. Hallucinations Resolved since on risperidone -BMP, future  2. Essential hypertension Controlled  3. Depression Cheerful and alert. Denies sad feelings. Sleeping well. Gaining weight.

## 2015-01-07 LAB — BASIC METABOLIC PANEL
BUN: 17 mg/dL (ref 4–21)
Creatinine: 0.6 mg/dL (ref 0.5–1.1)
Glucose: 78 mg/dL
POTASSIUM: 4 mmol/L (ref 3.4–5.3)
Sodium: 138 mmol/L (ref 137–147)

## 2015-01-08 DIAGNOSIS — M2042 Other hammer toe(s) (acquired), left foot: Secondary | ICD-10-CM | POA: Diagnosis not present

## 2015-01-08 DIAGNOSIS — M2041 Other hammer toe(s) (acquired), right foot: Secondary | ICD-10-CM | POA: Diagnosis not present

## 2015-01-08 DIAGNOSIS — L602 Onychogryphosis: Secondary | ICD-10-CM | POA: Diagnosis not present

## 2015-01-08 DIAGNOSIS — L84 Corns and callosities: Secondary | ICD-10-CM | POA: Diagnosis not present

## 2015-02-17 DIAGNOSIS — H353112 Nonexudative age-related macular degeneration, right eye, intermediate dry stage: Secondary | ICD-10-CM | POA: Diagnosis not present

## 2015-02-17 DIAGNOSIS — H353121 Nonexudative age-related macular degeneration, left eye, early dry stage: Secondary | ICD-10-CM | POA: Diagnosis not present

## 2015-03-02 ENCOUNTER — Non-Acute Institutional Stay: Payer: Medicare Other | Admitting: Nurse Practitioner

## 2015-03-02 ENCOUNTER — Encounter: Payer: Self-pay | Admitting: Nurse Practitioner

## 2015-03-02 DIAGNOSIS — F329 Major depressive disorder, single episode, unspecified: Secondary | ICD-10-CM | POA: Diagnosis not present

## 2015-03-02 DIAGNOSIS — G4700 Insomnia, unspecified: Secondary | ICD-10-CM | POA: Diagnosis not present

## 2015-03-02 DIAGNOSIS — I10 Essential (primary) hypertension: Secondary | ICD-10-CM

## 2015-03-02 DIAGNOSIS — R443 Hallucinations, unspecified: Secondary | ICD-10-CM

## 2015-03-02 DIAGNOSIS — F32A Depression, unspecified: Secondary | ICD-10-CM

## 2015-03-02 DIAGNOSIS — F411 Generalized anxiety disorder: Secondary | ICD-10-CM | POA: Diagnosis not present

## 2015-03-02 NOTE — Assessment & Plan Note (Signed)
Blood pressure is controlled, continue Amlodipine 10mg  daily, Losartan 100mg  daily, Metoprolol 25mg  daily.

## 2015-03-02 NOTE — Progress Notes (Signed)
Patient ID: [name], female   DOB: 20-Nov-1917, 82 y.o.   MRN: [id number]

## 2015-03-02 NOTE — Assessment & Plan Note (Signed)
Stable, continue Risperdal 0.25mg  bid. Observe for negative AR of Risperdal.

## 2015-03-02 NOTE — Assessment & Plan Note (Signed)
Her mood is stabilized, continue Risperdal.

## 2015-03-02 NOTE — Progress Notes (Signed)
Patient ID: [name], female   DOB: 07-Mar-1917, 82 y.o.   MRN: [id number]  Location:  [facility] Provider:  Marlana Latus NP  Code Status:  DNR Goals of care: Advanced Directive information Does patient have an advance directive?: Yes, Type of Advance Directive: Out of facility DNR (pink MOST or yellow form);Healthcare Power of Geographical information systems officer Complaint  Patient presents with  . Medical Management of Chronic Issues    Routine Visit     HPI: Patient is a 82 y.o. female seen in the AL at [facility] today for evaluation of chronic medical conditions. Hx of hallucination, insomnia, anxiety, stable while on Risperdal 0.25mg  bid. HTN is controlled, taking Losartan, Metoprolol, and Amlodipine. She is functioning adequately in AL setting.   Review of Systems:  Review of Systems  HENT: Positive for hearing loss. Negative for ear pain.   Eyes: Negative.   Respiratory: Negative.  Negative for shortness of breath.   Cardiovascular: Negative for chest pain, palpitations and leg swelling.       HTN  Gastrointestinal: Negative.   Genitourinary:       Incontinence  Musculoskeletal:       Pain in both hands: R>L.  Skin: Negative.   Neurological: Negative.   Psychiatric/Behavioral: The patient is nervous/anxious.     Past Medical History  Diagnosis Date  . Unspecified hearing loss 08/22/2011  . Anxiety state, unspecified 03/15/2010  . Depressive disorder, not elsewhere classified 03/15/2010  . Unspecified essential hypertension 03/15/2010  . Unspecified constipation 03/15/2010  . Senile osteoporosis 03/15/2010  . Other alteration of consciousness 03/15/2010  . Abnormality of gait 03/15/2010  . Dysphagia, unspecified(787.20) 03/15/2010  . Unspecified urinary incontinence 03/15/2010  . Personal history of fall 03/15/2010  . Herpes zoster without mention of complication [id number]  . Pain in hand 08/13/12  . Generalized anxiety disorder 06/23/2014    Patient Active Problem List   Diagnosis  Date Noted  . Hallucinations 12/22/2014  . Speech abnormality   . TIA (transient ischemic attack) 12/10/2014  . Hypokalemia 12/10/2014  . Generalized anxiety disorder 06/23/2014  . Hearing loss 06/03/2014  . Insomnia 12/16/2013  . Trigger finger, acquired 03/04/2013  . Pain in hand   . Essential hypertension 03/15/2010  . Urinary incontinence 03/15/2010  . Abnormality of gait 03/15/2010  . Depression 03/15/2010    Allergies  Allergen Reactions  . Ace Inhibitors Other (See Comments)    Reaction unknown  . Hydrochlorothiazide Other (See Comments)    Reaction unknown  . Morphine And Related Other (See Comments)    Hallucinations    Medications: Patient's Medications  New Prescriptions   No medications on file  Previous Medications   AMLODIPINE (NORVASC) 10 MG TABLET    TAKE (1) TABLET DAILY FOR HIGH BLOOD PRESSURE.   ASPIRIN EC 81 MG TABLET    Take 1 tablet (81 mg total) by mouth daily.   CALCIUM CARBONATE-VITAMIN D (CALCIUM 600+D) 600-200 MG-UNIT TABS    Take 1 tablet by mouth daily.    IBUPROFEN (ADVIL,MOTRIN) 200 MG TABLET    Take 400 mg by mouth every 6 (six) hours as needed (For headache, pain or fever.). Take one as needed   LOSARTAN (COZAAR) 100 MG TABLET    TAKE (1) TABLET DAILY FOR HIGH BLOOD PRESSURE.   METOPROLOL SUCCINATE (TOPROL-XL) 25 MG 24 HR TABLET    TAKE 1 TABLET ONCE DAILY TO CONTROL BLOOD PRESSURE.   RISPERIDONE (RISPERDAL) 0.5 MG TABLET    One twice daily  to prevent hallucinations and anxiety  Modified Medications   No medications on file  Discontinued Medications   No medications on file    Physical Exam: Filed Vitals:   03/02/15 0910  BP: 154/79  Pulse: 76  Temp: 98.2 F (36.8 C)  TempSrc: Oral  Resp: 20  Height: 5\' 2"  (1.575 m)  Weight: 122 lb 3.2 oz (55.43 kg)   Body mass index is 22.35 kg/(m^2).  Physical Exam  Constitutional: She is oriented to person, place, and time. She appears well-developed and well-nourished. No distress.    HENT:  Head: Normocephalic and atraumatic.  Partial hearing loss.  Eyes: Conjunctivae and EOM are normal. Pupils are equal, round, and reactive to light.  Neck: Normal range of motion. Neck supple. No JVD present. No tracheal deviation present. No thyromegaly present.  Cardiovascular: Normal rate, regular rhythm and normal heart sounds.  Exam reveals no gallop and no friction rub.   No murmur heard. Pulmonary/Chest: No respiratory distress. She has no wheezes. She has no rales. She exhibits no tenderness.  Abdominal: Bowel sounds are normal. She exhibits no distension and no mass. There is no tenderness.  Musculoskeletal: Normal range of motion. She exhibits no edema or tenderness.  Pain in hands. Right 3rd trigger finger. Uses walker due to unstable gait. Left 2nd toe is deviated to the great toe and has elevated DIP joint that rubs on her shoe.  Lymphadenopathy:    She has no cervical adenopathy.  Neurological: She is alert and oriented to person, place, and time. No cranial nerve deficit. Coordination normal.  12/29/14 MMSE 26/30 . Failed clock drawing.  Skin: No rash noted. No erythema. No pallor.  Psychiatric: She has a normal mood and affect. Her behavior is normal. Judgment and thought content normal.    Labs reviewed: Basic Metabolic Panel:  Recent Labs  12/07/14 1759 12/10/14 1546 01/07/15  NA 136 138 138  K 3.6 3.2* 4.0  CL 97* 101  --   CO2 25 25  --   GLUCOSE 118* 92  --   BUN 21 17 17   CREATININE 0.69 0.80 0.6  CALCIUM 9.1 9.2  --     Liver Function Tests:  Recent Labs  12/07/14 1759 12/10/14 1546  AST 21 24  ALT 14 19  ALKPHOS 77 76  BILITOT 0.6 0.8  PROT 7.1 7.9  ALBUMIN 4.4 5.0    CBC:  Recent Labs  12/07/14 1707 12/10/14 1546  WBC 9.1 8.4  NEUTROABS  --  4.9  HGB 13.3 14.2  HCT 38.5 41.3  MCV 97.9* 99.8  PLT  --  438*     Lab Results  Component Value Date   HGBA1C 5.6 12/11/2014   Lab Results  Component Value Date   CHOL 178  12/11/2014   HDL 60 12/11/2014   LDLCALC 97 12/11/2014   TRIG 106 12/11/2014   CHOLHDL 3.0 12/11/2014    Significant Diagnostic Results since last visit: none  Patient Care Team: Estill Dooms, MD as PCP - General (Internal Medicine) [facility] Gaynelle Arabian, MD as Consulting Physician (Orthopedic Surgery)  Assessment/Plan Problem List Items Addressed This Visit    Essential hypertension - Primary    Blood pressure is controlled, continue Amlodipine 10mg  daily, Losartan 100mg  daily, Metoprolol 25mg  daily.       Depression    Her mood is stable, continue Risperdal.       Insomnia    Sleeps better, continue Risperdal.  Generalized anxiety disorder    Her mood is stabilized, continue Risperdal.       Hallucinations    Stable, continue Risperdal 0.25mg  bid. Observe for negative AR of Risperdal.           Family/ staff Communication: continue AL for care needs.   Labs/tests ordered:  none  ManXie Wilho Sharpley NP [facility] [address], [place] [address] On Call:  [phone] & follow prompts after 5pm & weekends Office Phone:  [phone] Office Fax:  [phone]

## 2015-03-02 NOTE — Assessment & Plan Note (Signed)
Sleeps better, continue Risperdal.

## 2015-03-02 NOTE — Assessment & Plan Note (Signed)
Her mood is stable, continue Risperdal.

## 2015-04-13 ENCOUNTER — Encounter: Payer: Medicare Other | Admitting: Internal Medicine

## 2015-04-20 ENCOUNTER — Non-Acute Institutional Stay: Payer: Medicare Other | Admitting: Nurse Practitioner

## 2015-04-20 ENCOUNTER — Encounter: Payer: Self-pay | Admitting: Nurse Practitioner

## 2015-04-20 DIAGNOSIS — R443 Hallucinations, unspecified: Secondary | ICD-10-CM

## 2015-04-20 DIAGNOSIS — E876 Hypokalemia: Secondary | ICD-10-CM

## 2015-04-20 DIAGNOSIS — R609 Edema, unspecified: Secondary | ICD-10-CM | POA: Diagnosis not present

## 2015-04-20 DIAGNOSIS — G4700 Insomnia, unspecified: Secondary | ICD-10-CM | POA: Diagnosis not present

## 2015-04-20 DIAGNOSIS — F329 Major depressive disorder, single episode, unspecified: Secondary | ICD-10-CM | POA: Diagnosis not present

## 2015-04-20 DIAGNOSIS — N3942 Incontinence without sensory awareness: Secondary | ICD-10-CM | POA: Diagnosis not present

## 2015-04-20 DIAGNOSIS — F32A Depression, unspecified: Secondary | ICD-10-CM

## 2015-04-20 DIAGNOSIS — F411 Generalized anxiety disorder: Secondary | ICD-10-CM | POA: Diagnosis not present

## 2015-04-20 DIAGNOSIS — I10 Essential (primary) hypertension: Secondary | ICD-10-CM | POA: Diagnosis not present

## 2015-04-21 DIAGNOSIS — R609 Edema, unspecified: Secondary | ICD-10-CM | POA: Insufficient documentation

## 2015-04-21 NOTE — Assessment & Plan Note (Signed)
Her mood is stable, continue Risperdal 0.5mg  bid.

## 2015-04-21 NOTE — Assessment & Plan Note (Signed)
Sleeps better, continue Risperdal 0.5mg  bid

## 2015-04-21 NOTE — Assessment & Plan Note (Signed)
Last serum K 4.0 01/07/15

## 2015-04-21 NOTE — Assessment & Plan Note (Signed)
Trace edema in ankles, no increased SOB, cough, or phlegm production.

## 2015-04-21 NOTE — Assessment & Plan Note (Signed)
Persistent, adult brief for urinary incontinent care.

## 2015-04-21 NOTE — Assessment & Plan Note (Signed)
Blood pressure is controlled, continue Amlodipine 10mg  daily, Losartan 100mg  daily, Metoprolol 25mg  daily.

## 2015-04-21 NOTE — Assessment & Plan Note (Signed)
Her mood is stabilized, continue Risperdal 0.5mg  po bid.

## 2015-04-21 NOTE — Assessment & Plan Note (Signed)
Stable, continue Risperdal 0.5mg  bid. Observe for negative AR of Risperdal.

## 2015-04-21 NOTE — Progress Notes (Signed)
Patient ID: [name], female   DOB: 24-Apr-1917, 82 y.o.   MRN: [id number]  Location:  [place] Room Number: AL 8 Place of Service: [place] Provider:  Lennie Odor Santez Woodcox NP  Estill Dooms, MD  Patient Care Team: Estill Dooms, MD as PCP - General (Internal Medicine) [facility] Gaynelle Arabian, MD as Consulting Physician (Orthopedic Surgery)  Extended Emergency Contact Information Primary Emergency Contact: Peri [place] Mobile Phone: [phone] Relation: Daughter Secondary Emergency Contact: [place] Phone: [phone] Relation: Other  Code Status:  DNR Goals of care: Advanced Directive information Advanced Directives 04/20/2015  Does patient have an advance directive? Yes  Type of Advance Directive Out of facility DNR (pink MOST or yellow form);Healthcare Power of Attorney  Does patient want to make changes to advanced directive? No - Patient declined  Copy of advanced directive(s) in chart? Yes     Chief Complaint  Patient presents with  . Medical Management of Chronic Issues    Routine Visit    HPI:  Pt is a 82 y.o. female seen today for medical management of chronic diseases.  Hx of hallucination, insomnia, anxiety, stable while on Risperdal 0.25mg  bid. HTN is controlled, taking Losartan, Metoprolol, and Amlodipine. She is functioning adequately in AL setting.    Past Medical History  Diagnosis Date  . Unspecified hearing loss 08/22/2011  . Anxiety state, unspecified 03/15/2010  . Depressive disorder, not elsewhere classified 03/15/2010  . Unspecified essential hypertension 03/15/2010  . Unspecified constipation 03/15/2010  . Senile osteoporosis 03/15/2010  . Other alteration of consciousness 03/15/2010  . Abnormality of gait 03/15/2010  . Dysphagia, unspecified(787.20) 03/15/2010  . Unspecified urinary incontinence 03/15/2010  . Personal history of fall 03/15/2010  . Herpes zoster without mention of  complication [id number]  . Pain in hand 08/13/12  . Generalized anxiety disorder 06/23/2014   Past Surgical History  Procedure Laterality Date  . Spine surgery  12/2006  . Incision / drainage hand / finger Right 06/2008    hand    Allergies  Allergen Reactions  . Ace Inhibitors Other (See Comments)    Reaction unknown  . Hydrochlorothiazide Other (See Comments)    Reaction unknown  . Morphine And Related Other (See Comments)    Hallucinations      Medication List       This list is accurate as of: 04/20/15 11:59 PM.  Always use your most recent med list.               amLODipine 10 MG tablet  Commonly known as:  NORVASC  TAKE (1) TABLET DAILY FOR HIGH BLOOD PRESSURE.     aspirin EC 81 MG tablet  Take 1 tablet (81 mg total) by mouth daily.     CALCIUM 600+D 600-200 MG-UNIT Tabs  Generic drug:  Calcium Carbonate-Vitamin D  Take 1 tablet by mouth daily.     ibuprofen 200 MG tablet  Commonly known as:  ADVIL,MOTRIN  Take 400 mg by mouth every 6 (six) hours as needed (For headache, pain or fever.). Take one as needed     losartan 100 MG tablet  Commonly known as:  COZAAR  TAKE (1) TABLET DAILY FOR HIGH BLOOD PRESSURE.     metoprolol succinate 25 MG 24 hr tablet  Commonly known as:  TOPROL-XL  TAKE 1 TABLET ONCE DAILY TO CONTROL BLOOD PRESSURE.     NON FORMULARY  May have 2-4 oz of wine every day at  request     risperiDONE 0.5 MG tablet  Commonly known as:  RISPERDAL  One twice daily to prevent hallucinations and anxiety        Review of Systems  HENT: Positive for hearing loss. Negative for ear pain.   Eyes: Negative.   Respiratory: Negative.  Negative for shortness of breath.   Cardiovascular: Positive for leg swelling. Negative for chest pain and palpitations.       HTN Trace edema R+L ankles.  Gastrointestinal: Negative.   Genitourinary:       Incontinence  Musculoskeletal:       Pain in both hands: R>L.  Skin: Negative.   Neurological: Negative.    Psychiatric/Behavioral: The patient is nervous/anxious.     Immunization History  Administered Date(s) Administered  . Influenza Whole 10/24/2011, 10/24/2012  . Influenza-Unspecified 11/06/2013  . PPD Test 01/11/2015  . Pneumococcal Polysaccharide-23 01/23/2009  . Td 01/23/2009   Pertinent  Health Maintenance Due  Topic Date Due  . DEXA SCAN  07/26/1984  . PNA vac Low Risk Adult (2 of 2 - PCV13) 01/23/2010  . INFLUENZA VACCINE  08/24/2014   Fall Risk  01/05/2015 12/29/2014 12/29/2014 06/23/2014 12/16/2013  Falls in the past year? No No No Yes No  Number falls in past yr: - - - 1 -  Injury with Fall? - - - No -   Functional Status Survey:    Filed Vitals:   04/20/15 1013  BP: 128/66  Pulse: 58  Temp: 98.5 F (36.9 C)  TempSrc: Oral  Resp: 18  Height: 5\' 2"  (1.575 m)  Weight: 122 lb (55.339 kg)   Body mass index is 22.31 kg/(m^2). Physical Exam  Constitutional: She is oriented to person, place, and time. She appears well-developed and well-nourished. No distress.  HENT:  Head: Normocephalic and atraumatic.  Partial hearing loss.  Eyes: Conjunctivae and EOM are normal. Pupils are equal, round, and reactive to light.  Neck: Normal range of motion. Neck supple. No JVD present. No tracheal deviation present. No thyromegaly present.  Cardiovascular: Normal rate, regular rhythm and normal heart sounds.  Exam reveals no gallop and no friction rub.   No murmur heard. Pulmonary/Chest: No respiratory distress. She has no wheezes. She has no rales. She exhibits no tenderness.  Abdominal: Bowel sounds are normal. She exhibits no distension and no mass. There is no tenderness.  Musculoskeletal: Normal range of motion. She exhibits edema. She exhibits no tenderness.  Pain in hands. Right 3rd trigger finger. Uses walker due to unstable gait. Left 2nd toe is deviated to the great toe and has elevated DIP joint that rubs on her shoe. Trace edema R+L ankles  Lymphadenopathy:    She  has no cervical adenopathy.  Neurological: She is alert and oriented to person, place, and time. No cranial nerve deficit. Coordination normal.  12/29/14 MMSE 26/30 . Failed clock drawing.  Skin: No rash noted. No erythema. No pallor.  Psychiatric: She has a normal mood and affect. Her behavior is normal. Judgment and thought content normal.    Labs reviewed:  Recent Labs  12/07/14 1759 12/10/14 1546 01/07/15  NA 136 138 138  K 3.6 3.2* 4.0  CL 97* 101  --   CO2 25 25  --   GLUCOSE 118* 92  --   BUN 21 17 17   CREATININE 0.69 0.80 0.6  CALCIUM 9.1 9.2  --     Recent Labs  12/07/14 1759 12/10/14 1546  AST 21 24  ALT 14 19  ALKPHOS 77 76  BILITOT 0.6 0.8  PROT 7.1 7.9  ALBUMIN 4.4 5.0    Recent Labs  12/07/14 1707 12/10/14 1546  WBC 9.1 8.4  NEUTROABS  --  4.9  HGB 13.3 14.2  HCT 38.5 41.3  MCV 97.9* 99.8  PLT  --  438*    Lab Results  Component Value Date   HGBA1C 5.6 12/11/2014   Lab Results  Component Value Date   CHOL 178 12/11/2014   HDL 60 12/11/2014   LDLCALC 97 12/11/2014   TRIG 106 12/11/2014   CHOLHDL 3.0 12/11/2014    Significant Diagnostic Results in last 30 days:  No results found.  Assessment/Plan  Edema Trace edema in ankles, no increased SOB, cough, or phlegm production.   Depression Her mood is stable, continue Risperdal 0.5mg  bid.   Essential hypertension Blood pressure is controlled, continue Amlodipine 10mg  daily, Losartan 100mg  daily, Metoprolol 25mg  daily.   Generalized anxiety disorder Her mood is stabilized, continue Risperdal 0.5mg  po bid.   Hallucinations Stable, continue Risperdal 0.5mg  bid. Observe for negative AR of Risperdal.   Hypokalemia Last serum K 4.0 01/07/15  Insomnia Sleeps better, continue Risperdal 0.5mg  bid  Urinary incontinence Persistent, adult brief for urinary incontinent care.     Family/ staff Communication: continue AL for care needs.   Labs/tests ordered: none

## 2015-06-15 ENCOUNTER — Encounter: Payer: Self-pay | Admitting: Internal Medicine

## 2015-06-15 ENCOUNTER — Non-Acute Institutional Stay: Payer: Medicare Other | Admitting: Internal Medicine

## 2015-06-15 VITALS — BP 150/68 | HR 75 | Temp 97.9°F | Ht 62.0 in | Wt 123.0 lb

## 2015-06-15 DIAGNOSIS — F333 Major depressive disorder, recurrent, severe with psychotic symptoms: Secondary | ICD-10-CM

## 2015-06-15 DIAGNOSIS — R350 Frequency of micturition: Secondary | ICD-10-CM | POA: Diagnosis not present

## 2015-06-15 DIAGNOSIS — N390 Urinary tract infection, site not specified: Secondary | ICD-10-CM | POA: Diagnosis not present

## 2015-06-15 DIAGNOSIS — K5900 Constipation, unspecified: Secondary | ICD-10-CM | POA: Insufficient documentation

## 2015-06-15 DIAGNOSIS — N8110 Cystocele, unspecified: Secondary | ICD-10-CM

## 2015-06-15 DIAGNOSIS — K5901 Slow transit constipation: Secondary | ICD-10-CM | POA: Diagnosis not present

## 2015-06-15 DIAGNOSIS — F329 Major depressive disorder, single episode, unspecified: Secondary | ICD-10-CM

## 2015-06-15 HISTORY — DX: Major depressive disorder, single episode, unspecified: F32.9

## 2015-06-15 HISTORY — DX: Cystocele, unspecified: N81.10

## 2015-06-15 MED ORDER — CITALOPRAM HYDROBROMIDE 10 MG PO TABS: ORAL_TABLET | ORAL | Status: DC

## 2015-06-15 MED ORDER — SULFAMETHOXAZOLE-TRIMETHOPRIM 800-160 MG PO TABS: ORAL_TABLET | ORAL | Status: DC

## 2015-06-15 NOTE — Progress Notes (Signed)
Patient ID: [name], female   DOB: Dec 11, 1917, 82 y.o.   MRN: [id number]    [facility] Room Number: AL 08  Place of Service: Clinic (12)     Allergies  Allergen Reactions  . Ace Inhibitors Other (See Comments)    Reaction unknown  . Hydrochlorothiazide Other (See Comments)    Reaction unknown  . Morphine And Related Other (See Comments)    Hallucinations    Chief Complaint  Patient presents with  . Acute Visit    per AL nurse trouble with bowels and bladder not able to go.  . Depression    "I'm 82 years old and I'm ready to die!"    HPI:  Having constipation usually, but took something yesterday that has resulted in loose stools today. Worries she could have a growth that is blocking her bowels from moving normally.  Having urinary frequency and then only passes a small quantity of urine after sitting a while. Denies pain with urination. Worries she could have renal failure.  Has been depressed.  Medications: Patient's Medications  New Prescriptions   No medications on file  Previous Medications   AMLODIPINE (NORVASC) 10 MG TABLET    TAKE (1) TABLET DAILY FOR HIGH BLOOD PRESSURE.   ASPIRIN EC 81 MG TABLET    Take 1 tablet (81 mg total) by mouth daily.   CALCIUM CARBONATE-VITAMIN D (CALCIUM 600+D) 600-200 MG-UNIT TABS    Take 1 tablet by mouth daily.    IBUPROFEN (ADVIL,MOTRIN) 200 MG TABLET    Take 400 mg by mouth every 6 (six) hours as needed (For headache, pain or fever.). Take one as needed   LOSARTAN (COZAAR) 100 MG TABLET    TAKE (1) TABLET DAILY FOR HIGH BLOOD PRESSURE.   METOPROLOL SUCCINATE (TOPROL-XL) 25 MG 24 HR TABLET    TAKE 1 TABLET ONCE DAILY TO CONTROL BLOOD PRESSURE.   RISPERIDONE (RISPERDAL) 0.5 MG TABLET    One twice daily to prevent hallucinations and anxiety  Modified Medications   No medications on file  Discontinued Medications   NON FORMULARY    May have 2-4 oz of wine every day at request     Review of  Systems  Constitutional: Negative for activity change, appetite change and fatigue.  HENT: Positive for hearing loss. Negative for ear pain.   Eyes: Negative.   Respiratory: Negative.  Negative for shortness of breath.   Cardiovascular: Negative for chest pain, palpitations and leg swelling.       HTN  Gastrointestinal: Negative.   Endocrine: Negative.   Genitourinary:       Incontinence  Musculoskeletal: Positive for arthralgias (in feet).       Pain in both hands: R>L.  Skin: Negative.   Neurological: Negative.   Hematological: Negative.   Psychiatric/Behavioral: Positive for confusion and sleep disturbance (Wakes at 3 AM. Full of energy then. Uses Motrin PM to get back to sleep. ). The patient is nervous/anxious.     Filed Vitals:   06/15/15 1112  BP: 150/68  Pulse: 75  Temp: 97.9 F (36.6 C)  TempSrc: Oral  Height: _0  (1.575 m)  Weight: 123 lb (55.792 kg)  SpO2: 92%   Wt Readings from Last 3 Encounters:  06/15/15 123 lb (55.792 kg)  04/20/15 122 lb (55.339 kg)  03/02/15 122 lb 3.2 oz (55.43 kg)    Body mass index is 22.49 kg/(m^2).  Physical Exam  Constitutional: She is oriented to person, place, and  time. She appears well-developed and well-nourished. No distress.  HENT:  Head: Normocephalic and atraumatic.  Partial hearing loss.  Eyes: Conjunctivae and EOM are normal. Pupils are equal, round, and reactive to light.  Neck: Normal range of motion. Neck supple. No JVD present. No tracheal deviation present. No thyromegaly present.  Cardiovascular: Normal rate, regular rhythm and normal heart sounds.  Exam reveals no gallop and no friction rub.   No murmur heard. Pulmonary/Chest: No respiratory distress. She has no wheezes. She has no rales. She exhibits no tenderness.  Abdominal: Bowel sounds are normal. She exhibits no distension and no mass. There is no tenderness.  Genitourinary: Guaiac negative stool.  Firm area at the top of the vagina. Prolapse of the  vaginal walls. Loss of rectal sphincter tone.  Musculoskeletal: Normal range of motion. She exhibits edema. She exhibits no tenderness.  Pain in hands. Right 3rd trigger finger. Uses walker due to unstable gait. Left 2nd toe is deviated to the great toe and has elevated DIP joint that rubs on her shoe. Trace edema R+L ankles  Lymphadenopathy:    She has no cervical adenopathy.  Neurological: She is alert and oriented to person, place, and time. No cranial nerve deficit. Coordination normal.  12/29/14 MMSE 26/30 . Failed clock drawing.  Skin: No rash noted. No erythema. No pallor.  Psychiatric: She has a normal mood and affect. Her behavior is normal. Judgment and thought content normal.     Labs reviewed: Lab Summary Latest Ref Rng 01/07/2015 12/11/2014 12/10/2014 12/07/2014  Hemoglobin 12.0 - 15.0 g/dL (None) (None) 14.2 13.3  Hematocrit 36.0 - 46.0 % (None) (None) 41.3 38.5  White count 4.0 - 10.5 K/uL (None) (None) 8.4 9.1  Platelet count 150 - 400 K/uL (None) (None) 438(H) (None)  Sodium 137 - 147 mmol/L 138 (None) 138 136  Potassium 3.4 - 5.3 mmol/L 4.0 (None) 3.2(L) 3.6  Calcium 8.9 - 10.3 mg/dL (None) (None) 9.2 9.1  Phosphorus - (None) (None) (None) (None)  Creatinine 0.5 - 1.1 mg/dL 0.6 (None) 0.80 0.69  AST 15 - 41 U/L (None) (None) 24 21  Alk Phos 38 - 126 U/L (None) (None) 76 77  Bilirubin 0.3 - 1.2 mg/dL (None) (None) 0.8 0.6  Glucose - 78 (None) 92 118(H)  Cholesterol 0 - 200 mg/dL (None) 178 (None) (None)  HDL cholesterol >40 mg/dL (None) 60 (None) (None)  Triglycerides <150 mg/dL (None) 106 (None) (None)  LDL Direct - (None) (None) (None) (None)  LDL Calc 0 - 99 mg/dL (None) 97 (None) (None)  Total protein 6.5 - 8.1 g/dL (None) (None) 7.9 7.1  Albumin 3.5 - 5.0 g/dL (None) (None) 5.0 4.4   Lab Results  Component Value Date   TSH 2.138 *Test methodology is 3rd generation TSH* 05/27/2008   Lab Results  Component Value Date   BUN 17 01/07/2015   BUN 17  12/10/2014   BUN 21 12/07/2014   Lab Results  Component Value Date   CREATININE 0.6 01/07/2015   CREATININE 0.80 12/10/2014   CREATININE 0.69 12/07/2014   Lab Results  Component Value Date   HGBA1C 5.6 12/11/2014       Assessment/Plan  1. Slow transit constipation Add Senokot-S 2 qhs  2. Urinary frequency -cath UA and culture. Urine was very cloudy -Septra DS bid x 7 days.  3. Severe episode of recurrent major depressive disorder, with psychotic features ([place]) - citalopram (CELEXA) 10 MG tablet; One daily to help depression and anxiety  Dispense: 30 tablet; Refill:  5  4. Vaginal prolapse Observe

## 2015-06-22 ENCOUNTER — Encounter: Payer: Self-pay | Admitting: Internal Medicine

## 2015-06-22 ENCOUNTER — Non-Acute Institutional Stay: Payer: Medicare Other | Admitting: Internal Medicine

## 2015-06-22 VITALS — BP 122/58 | HR 53 | Temp 97.9°F | Ht 62.0 in | Wt 121.0 lb

## 2015-06-22 DIAGNOSIS — K5901 Slow transit constipation: Secondary | ICD-10-CM

## 2015-06-22 DIAGNOSIS — R350 Frequency of micturition: Secondary | ICD-10-CM

## 2015-06-22 DIAGNOSIS — F333 Major depressive disorder, recurrent, severe with psychotic symptoms: Secondary | ICD-10-CM | POA: Diagnosis not present

## 2015-06-22 DIAGNOSIS — N8110 Cystocele, unspecified: Secondary | ICD-10-CM | POA: Diagnosis not present

## 2015-06-22 DIAGNOSIS — R443 Hallucinations, unspecified: Secondary | ICD-10-CM | POA: Diagnosis not present

## 2015-06-22 DIAGNOSIS — I10 Essential (primary) hypertension: Secondary | ICD-10-CM | POA: Diagnosis not present

## 2015-06-22 MED ORDER — RISPERIDONE 0.5 MG PO TABS: ORAL_TABLET | ORAL | Status: DC

## 2015-06-22 NOTE — Progress Notes (Signed)
Patient ID: [name], female   DOB: 1917/04/07, 82 y.o.   MRN: [id number]    [place] Room Number: 08  Place of Service: Clinic (12)     Allergies  Allergen Reactions  . Ace Inhibitors Other (See Comments)    Reaction unknown  . Hydrochlorothiazide Other (See Comments)    Reaction unknown  . Morphine And Related Other (See Comments)    Hallucinations    Chief Complaint  Patient presents with  . Medical Management of Chronic Issues    one week follow-up depression, urinary frequency, constipation  . Dizziness    has to hold on to things to walk to keep balance    HPI:  Last seen 7 days ago. Had vaginal prolapse. Says she is having less discomfort.   Added Senokot for constipation. Doing better.  Urine cultured - .100,000 multiple species  Added Celexa for depression. She worries it is causing drowsiness, but she is also on Risperdal. Denies recent hallucinations.  Medications: Patient's Medications  New Prescriptions   No medications on file  Previous Medications   AMLODIPINE (NORVASC) 10 MG TABLET    TAKE (1) TABLET DAILY FOR HIGH BLOOD PRESSURE.   ASPIRIN EC 81 MG TABLET    Take 1 tablet (81 mg total) by mouth daily.   CALCIUM CARBONATE-VITAMIN D (CALCIUM 600+D) 600-200 MG-UNIT TABS    Take 1 tablet by mouth daily.    CITALOPRAM (CELEXA) 10 MG TABLET    One daily to help depression and anxiety   IBUPROFEN (ADVIL,MOTRIN) 200 MG TABLET    Take 400 mg by mouth every 6 (six) hours as needed (For headache, pain or fever.). Take one as needed   LOSARTAN (COZAAR) 100 MG TABLET    TAKE (1) TABLET DAILY FOR HIGH BLOOD PRESSURE.   METOPROLOL SUCCINATE (TOPROL-XL) 25 MG 24 HR TABLET    TAKE 1 TABLET ONCE DAILY TO CONTROL BLOOD PRESSURE.   RISPERIDONE (RISPERDAL) 0.5 MG TABLET    One twice daily to prevent hallucinations and anxiety   SENNA (SENOKOT) 8.6 MG TABLET    Take 1 tablet by mouth. Take 2 at bedtime for constipation   SULFAMETHOXAZOLE-TRIMETHOPRIM (BACTRIM DS,SEPTRA DS) 800-160 MG TABLET    One twice daily to treat infection  Modified Medications   No medications on file  Discontinued Medications   No medications on file     Review of Systems  Constitutional: Negative for activity change, appetite change and fatigue.  HENT: Positive for hearing loss. Negative for ear pain.   Eyes: Negative.   Respiratory: Negative.  Negative for shortness of breath.   Cardiovascular: Negative for chest pain, palpitations and leg swelling.       HTN  Gastrointestinal: Negative.   Endocrine: Negative.   Genitourinary:       Incontinence  Musculoskeletal: Positive for arthralgias (in feet).       Pain in both hands: R>L.  Skin: Negative.   Neurological: Negative.   Hematological: Negative.   Psychiatric/Behavioral: Positive for confusion and sleep disturbance (Wakes at 3 AM. Full of energy then. Uses Motrin PM to get back to sleep. ). The patient is nervous/anxious.        Hx of hallucinations.    Filed Vitals:   06/22/15 1000  BP: 122/58  Pulse: 53  Temp: 97.9 F (36.6 C)  TempSrc: Oral  Height: 5' 2" (1.575 m)  Weight: 121 lb (54.885 kg)  SpO2: 94%   Wt Readings from Last 3  Encounters:  06/22/15 121 lb (54.885 kg)  06/15/15 123 lb (55.792 kg)  04/20/15 122 lb (55.339 kg)    Body mass index is 22.13 kg/(m^2).  Physical Exam  Constitutional: She is oriented to person, place, and time. She appears well-developed and well-nourished. No distress.  HENT:  Head: Normocephalic and atraumatic.  Partial hearing loss.  Eyes: Conjunctivae and EOM are normal. Pupils are equal, round, and reactive to light.  Neck: Normal range of motion. Neck supple. No JVD present. No tracheal deviation present. No thyromegaly present.  Cardiovascular: Normal rate, regular rhythm and normal heart sounds.  Exam reveals no gallop and no friction rub.   No murmur heard. Pulmonary/Chest: No respiratory distress. She has no  wheezes. She has no rales. She exhibits no tenderness.  Abdominal: Bowel sounds are normal. She exhibits no distension and no mass. There is no tenderness.  Genitourinary: Guaiac negative stool.  Examined 06/15/15: Firm area at the top of the vagina. Prolapse of the vaginal walls. Loss of rectal sphincter tone.  Musculoskeletal: Normal range of motion. She exhibits edema. She exhibits no tenderness.  Pain in hands. Right 3rd trigger finger. Uses walker due to unstable gait. Left 2nd toe is deviated to the great toe and has elevated DIP joint that rubs on her shoe. Trace edema R+L ankles  Lymphadenopathy:    She has no cervical adenopathy.  Neurological: She is alert and oriented to person, place, and time. No cranial nerve deficit. Coordination normal.  12/29/14 MMSE 26/30 . Failed clock drawing.  Skin: No rash noted. No erythema. No pallor.  Psychiatric: She has a normal mood and affect. Her behavior is normal. Judgment and thought content normal.     Labs reviewed: Lab Summary Latest Ref Rng 01/07/2015 12/11/2014 12/10/2014 12/07/2014  Hemoglobin 12.0 - 15.0 g/dL (None) (None) 14.2 13.3  Hematocrit 36.0 - 46.0 % (None) (None) 41.3 38.5  White count 4.0 - 10.5 K/uL (None) (None) 8.4 9.1  Platelet count 150 - 400 K/uL (None) (None) 438(H) (None)  Sodium 137 - 147 mmol/L 138 (None) 138 136  Potassium 3.4 - 5.3 mmol/L 4.0 (None) 3.2(L) 3.6  Calcium 8.9 - 10.3 mg/dL (None) (None) 9.2 9.1  Phosphorus - (None) (None) (None) (None)  Creatinine 0.5 - 1.1 mg/dL 0.6 (None) 0.80 0.69  AST 15 - 41 U/L (None) (None) 24 21  Alk Phos 38 - 126 U/L (None) (None) 76 77  Bilirubin 0.3 - 1.2 mg/dL (None) (None) 0.8 0.6  Glucose - 78 (None) 92 118(H)  Cholesterol 0 - 200 mg/dL (None) 178 (None) (None)  HDL cholesterol >40 mg/dL (None) 60 (None) (None)  Triglycerides <150 mg/dL (None) 106 (None) (None)  LDL Direct - (None) (None) (None) (None)  LDL Calc 0 - 99 mg/dL (None) 97 (None) (None)  Total  protein 6.5 - 8.1 g/dL (None) (None) 7.9 7.1  Albumin 3.5 - 5.0 g/dL (None) (None) 5.0 4.4   Lab Results  Component Value Date   TSH 2.138 *Test methodology is 3rd generation TSH* 05/27/2008   Lab Results  Component Value Date   BUN 17 01/07/2015   BUN 17 12/10/2014   BUN 21 12/07/2014   Lab Results  Component Value Date   CREATININE 0.6 01/07/2015   CREATININE 0.80 12/10/2014   CREATININE 0.69 12/07/2014   Lab Results  Component Value Date   HGBA1C 5.6 12/11/2014       Assessment/Plan  1. Urinary frequency improved  2. Vaginal prolapse Less discomfort  3. Slow transit constipation  improved with Senokot  4. Severe episode of recurrent major depressive disorder, with psychotic features ([place]) Advised patient to continue citalopram 10 mg daily. Recheck in 3 months.  5. Essential hypertension improved  6. Hallucinations Reduce dose of Risperdal - risperiDONE (RISPERDAL) 0.5 MG tablet; One at bedtime to prevent hallucinations and anxiety  Dispense: 30 tablet; Refill: 4

## 2015-07-05 ENCOUNTER — Encounter: Payer: Self-pay | Admitting: Internal Medicine

## 2015-07-15 DIAGNOSIS — N390 Urinary tract infection, site not specified: Secondary | ICD-10-CM | POA: Diagnosis not present

## 2015-08-17 ENCOUNTER — Encounter: Payer: Self-pay | Admitting: Nurse Practitioner

## 2015-08-17 ENCOUNTER — Non-Acute Institutional Stay: Payer: Medicare Other | Admitting: Nurse Practitioner

## 2015-08-17 DIAGNOSIS — K5900 Constipation, unspecified: Secondary | ICD-10-CM

## 2015-08-17 DIAGNOSIS — R609 Edema, unspecified: Secondary | ICD-10-CM

## 2015-08-17 DIAGNOSIS — R443 Hallucinations, unspecified: Secondary | ICD-10-CM

## 2015-08-17 DIAGNOSIS — I10 Essential (primary) hypertension: Secondary | ICD-10-CM

## 2015-08-17 DIAGNOSIS — F320 Major depressive disorder, single episode, mild: Secondary | ICD-10-CM | POA: Diagnosis not present

## 2015-08-17 NOTE — Assessment & Plan Note (Signed)
Trace edema in ankles, no increased SOB, cough, or phlegm production.

## 2015-08-17 NOTE — Assessment & Plan Note (Signed)
Stable, continue Senakot I daily

## 2015-08-17 NOTE — Assessment & Plan Note (Signed)
Stable, continue Risperdal 0.5mg  bid. Observe for negative AR of Risperdal. Update CBC and TSH

## 2015-08-17 NOTE — Assessment & Plan Note (Signed)
Her mood is stable, continue Risperdal 0.5mg  bid, Celexa 10mg 

## 2015-08-17 NOTE — Progress Notes (Signed)
Location:   [facility] Room Number: AL - Place of Service:  SNF (31) Provider:  ,   NP  Jeanmarie Hubert, MD  Patient Care Team: Estill Dooms, MD as PCP - General (Internal Medicine) [facility] Gaynelle Arabian, MD as Consulting Physician (Orthopedic Surgery)  Extended Emergency Contact Information Primary Emergency Contact: Peri [place] Mobile Phone: [phone] Relation: Daughter Secondary Emergency Contact: [facility] Phone: [phone] Relation: Other  Code Status:  DNR Goals of care: Advanced Directive information Advanced Directives 08/17/2015  Does patient have an advance directive? Yes  Type of Paramedic of [place];Living will;Out of facility DNR (pink MOST or yellow form)  Does patient want to make changes to advanced directive? No - Patient declined  Copy of advanced directive(s) in chart? Yes     Chief Complaint  Patient presents with  . Medical agement of Chronic Issues    HPI:  Pt is a 82 y.o. female seen today for medical management of chronic diseases.    Hx of hallucination, insomnia, anxiety, stable while on Risperdal 0.2m bid, Celexa 154m  HTN is controlled, taking Losartan, Metoprolol, and Amlodipine. She is functioning adequately in AL setting.   Past Medical History:  Diagnosis Date  . Abnormality of gait 03/15/2010  . Anxiety state, unspecified 03/15/2010  . Depression, major ([place]/23/2017  . Depressive disorder, not elsewhere classified 03/15/2010  . Dysphagia, unspecified(787.20) 03/15/2010  . Generalized anxiety disorder 06/23/2014  . Herpes zoster without mention of complication 07/30/08/1751. Other alteration of consciousness 03/15/2010  . Pain in hand 08/13/12  . Personal history of fall 03/15/2010  . Senile osteoporosis 03/15/2010  . Unspecified constipation 03/15/2010  . Unspecified essential hypertension 03/15/2010  . Unspecified hearing loss  08/22/2011  . Unspecified urinary incontinence 03/15/2010  . Urinary incontinence 03/15/2010  . Vaginal prolapse 06/15/2015   Past Surgical History:  Procedure Laterality Date  . INCISION / DRAINAGE HAND / FINGER Right 06/2008   hand  . SPINE SURGERY  12/2006    Allergies  Allergen Reactions  . Ace Inhibitors Other (See Comments)    Reaction unknown  . Hydrochlorothiazide Other (See Comments)    Reaction unknown  . Morphine And Related Other (See Comments)    Hallucinations      Medication List       Accurate as of 08/17/15  7:01 PM. Always use your most recent med list.          amLODipine 10 MG tablet Commonly known as:  NORVASC TAKE (1) TABLET DAILY FOR HIGH BLOOD PRESSURE.   aspirin EC 81 MG tablet Take 1 tablet (81 mg total) by mouth daily.   CALCIUM 600+D 600-200 MG-UNIT Tabs Generic drug:  Calcium Carbonate-Vitamin D Take 1 tablet by mouth daily.   citalopram 10 MG tablet Commonly known as:  CELEXA One daily to help depression and anxiety   ibuprofen 200 MG tablet Commonly known as:  ADVIL,MOTRIN Take 400 mg by mouth every 6 (six) hours as needed (For headache, pain or fever.). Take one as needed   losartan 100 MG tablet Commonly known as:  COZAAR TAKE (1) TABLET DAILY FOR HIGH BLOOD PRESSURE.   metoprolol succinate 25 MG 24 hr tablet Commonly known as:  TOPROL-XL TAKE 1 TABLET ONCE DAILY TO CONTROL BLOOD PRESSURE.   risperiDONE 0.5 MG tablet Commonly known as:  RISPERDAL One at bedtime to prevent hallucinations and anxiety   senna 8.6 MG tablet Commonly known  asDonavan Burnet Take 1 tablet by mouth. Take 2 at bedtime for constipation       Review of Systems  Constitutional: Negative for activity change, appetite change and fatigue.  HENT: Positive for hearing loss. Negative for ear pain.   Eyes: Negative.   Respiratory: Negative.  Negative for shortness of breath.   Cardiovascular: Negative for chest pain, palpitations and leg swelling.        HTN  Gastrointestinal: Negative.   Endocrine: Negative.   Genitourinary:       Incontinence  Musculoskeletal: Positive for arthralgias (in feet).       Pain in both hands: R>L.  Skin: Negative.   Neurological: Negative.   Hematological: Negative.   Psychiatric/Behavioral: Positive for confusion and sleep disturbance (Wakes at 3 AM. Full of energy then. Uses Motrin PM to get back to sleep. ). The patient is nervous/anxious.        Hx of hallucinations.   Patient ID: [name], female   DOB: 10-Oct-1917, 82 y.o.   MRN: [id number]    [place] Room Number: AL -  Place of Service: SNF (31)     Allergies  Allergen Reactions  . Ace Inhibitors Other (See Comments)    Reaction unknown  . Hydrochlorothiazide Other (See Comments)    Reaction unknown  . Morphine And Related Other (See Comments)    Hallucinations    Chief Complaint  Patient presents with  . Medical Management of Chronic Issues    HPI:  Last seen 7 days ago. Had vaginal prolapse. Says she is having less discomfort.   Added Senokot for constipation. Doing better.  Urine cultured - .100,000 multiple species  Added Celexa for depression. She worries it is causing drowsiness, but she is also on Risperdal. Denies recent hallucinations.  Medications: Patient's Medications  New Prescriptions   No medications on file  Previous Medications   AMLODIPINE (NORVASC) 10 MG TABLET    TAKE (1) TABLET DAILY FOR HIGH BLOOD PRESSURE.   ASPIRIN EC 81 MG TABLET    Take 1 tablet (81 mg total) by mouth daily.   CALCIUM CARBONATE-VITAMIN D (CALCIUM 600+D) 600-200 MG-UNIT TABS    Take 1 tablet by mouth daily.    CITALOPRAM (CELEXA) 10 MG TABLET    One daily to help depression and anxiety   IBUPROFEN (ADVIL,MOTRIN) 200 MG TABLET    Take 400 mg by mouth every 6 (six) hours as needed (For headache, pain or fever.). Take one as needed   LOSARTAN (COZAAR) 100 MG TABLET    TAKE (1) TABLET DAILY FOR HIGH BLOOD PRESSURE.     METOPROLOL SUCCINATE (TOPROL-XL) 25 MG 24 HR TABLET    TAKE 1 TABLET ONCE DAILY TO CONTROL BLOOD PRESSURE.   RISPERIDONE (RISPERDAL) 0.5 MG TABLET    One at bedtime to prevent hallucinations and anxiety   SENNA (SENOKOT) 8.6 MG TABLET    Take 1 tablet by mouth. Take 2 at bedtime for constipation  Modified Medications   No medications on file  Discontinued Medications   SULFAMETHOXAZOLE-TRIMETHOPRIM (BACTRIM DS,SEPTRA DS) 800-160 MG TABLET    One twice daily to treat infection     Review of Systems  Constitutional: Negative for activity change, appetite change and fatigue.  HENT: Positive for hearing loss. Negative for ear pain.   Eyes: Negative.   Respiratory: Negative.  Negative for shortness of breath.   Cardiovascular: Negative for chest pain, palpitations and leg swelling.  HTN  Gastrointestinal: Negative.   Endocrine: Negative.   Genitourinary:       Incontinence  Musculoskeletal: Positive for arthralgias (in feet).       Pain in both hands: R>L.  Skin: Negative.   Neurological: Negative.   Hematological: Negative.   Psychiatric/Behavioral: Positive for confusion and sleep disturbance (Wakes at 3 AM. Full of energy then. Uses Motrin PM to get back to sleep. ). The patient is nervous/anxious.        Hx of hallucinations.    Vitals:   08/17/15 1148  BP: (!) 154/74  Pulse: (!) 50  Resp: 20  Temp: 98.2 F (36.8 C)  SpO2: 96%  Weight: 121 lb (54.9 kg)  Height: 5' 2" (1.575 m)   Wt Readings from Last 3 Encounters:  08/17/15 121 lb (54.9 kg)  06/22/15 121 lb (54.9 kg)  06/15/15 123 lb (55.8 kg)    Body mass index is 22.13 kg/m.  Physical Exam  Constitutional: She is oriented to person, place, and time. She appears well-developed and well-nourished. No distress.  HENT:  Head: Normocephalic and atraumatic.  Partial hearing loss.  Eyes: Conjunctivae and EOM are normal. Pupils are equal, round, and reactive to light.  Neck: Normal range of motion. Neck  supple. No JVD present. No tracheal deviation present. No thyromegaly present.  Cardiovascular: Normal rate, regular rhythm and normal heart sounds.  Exam reveals no gallop and no friction rub.   No murmur heard. Pulmonary/Chest: No respiratory distress. She has no wheezes. She has no rales. She exhibits no tenderness.  Abdominal: Bowel sounds are normal. She exhibits no distension and no mass. There is no tenderness.  Genitourinary: Guaiac negative stool.  Examined 06/15/15: Firm area at the top of the vagina. Prolapse of the vaginal walls. Loss of rectal sphincter tone.  Musculoskeletal: Normal range of motion. She exhibits edema. She exhibits no tenderness.  Pain in hands. Right 3rd trigger finger. Uses walker due to unstable gait. Left 2nd toe is deviated to the great toe and has elevated DIP joint that rubs on her shoe. Trace edema R+L ankles  Lymphadenopathy:    She has no cervical adenopathy.  Neurological: She is alert and oriented to person, place, and time. No cranial nerve deficit. Coordination normal.  12/29/14 MMSE 26/30 . Failed clock drawing.  Skin: No rash noted. No erythema. No pallor.  Psychiatric: She has a normal mood and affect. Her behavior is normal. Judgment and thought content normal.     Labs reviewed: Lab Summary Latest Ref Rng & Units 01/07/2015 12/11/2014 12/10/2014 12/07/2014  Hemoglobin 12.0 - 15.0 g/dL (None) (None) 14.2 13.3  Hematocrit 36.0 - 46.0 % (None) (None) 41.3 38.5  White count 4.0 - 10.5 K/uL (None) (None) 8.4 9.1  Platelet count 150 - 400 K/uL (None) (None) 438(H) (None)  Sodium 137 - 147 mmol/L 138 (None) 138 136  Potassium 3.4 - 5.3 mmol/L 4.0 (None) 3.2(L) 3.6  Calcium 8.9 - 10.3 mg/dL (None) (None) 9.2 9.1  Phosphorus - (None) (None) (None) (None)  Creatinine 0.5 - 1.1 mg/dL 0.6 (None) 0.80 0.69  AST 15 - 41 U/L (None) (None) 24 21  Alk Phos 38 - 126 U/L (None) (None) 76 77  Bilirubin 0.3 - 1.2 mg/dL (None) (None) 0.8 0.6  Glucose  mg/dL 78 (None) 92 118(H)  Cholesterol 0 - 200 mg/dL (None) 178 (None) (None)  HDL cholesterol >40 mg/dL (None) 60 (None) (None)  Triglycerides <150 mg/dL (None) 106 (None) (None)  LDL Direct - (None) (  None) (None) (None)  LDL Calc 0 - 99 mg/dL (None) 97 (None) (None)  Total protein 6.5 - 8.1 g/dL (None) (None) 7.9 7.1  Albumin 3.5 - 5.0 g/dL (None) (None) 5.0 4.4  Some recent data might be hidden   Lab Results  Component Value Date   TSH 2.138 *Test methodology is 3rd generation TSH* 05/27/2008   Lab Results  Component Value Date   BUN 17 01/07/2015   BUN 17 12/10/2014   BUN 21 12/07/2014   Lab Results  Component Value Date   CREATININE 0.6 01/07/2015   CREATININE 0.80 12/10/2014   CREATININE 0.69 12/07/2014   Lab Results  Component Value Date   HGBA1C 5.6 12/11/2014       Assessment/Plan  1. Urinary frequency improved  2. Vaginal prolapse Less discomfort  3. Slow transit constipation improved with Senokot  4. Severe episode of recurrent major depressive disorder, with psychotic features ([place]) Advised patient to continue citalopram 10 mg daily. Recheck in 3 months.  5. Essential hypertension improved  6. Hallucinations Reduce dose of Risperdal - risperiDONE (RISPERDAL) 0.5 MG tablet; One at bedtime to prevent hallucinations and anxiety  Dispense: 30 tablet; Refill: 4     Immunization History  Administered Date(s) Administered  . Influenza Whole 10/24/2011, 10/24/2012  . Influenza-Unspecified 11/06/2013  . PPD Test 01/11/2015  . Pneumococcal Polysaccharide-23 01/23/2009  . Td 01/23/2009   Pertinent  Health Maintenance Due  Topic Date Due  . DEXA SCAN  07/26/1984  . PNA vac Low Risk Adult (2 of 2 - PCV13) 01/23/2010  . INFLUENZA VACCINE  08/24/2015   Fall Risk  06/15/2015 01/05/2015 12/29/2014 12/29/2014 06/23/2014  Falls in the past year? No No No No Yes  Number falls in past yr: - - - - 1  Injury with Fall? - - - - No   Functional Status  Survey:    Vitals:   08/17/15 1148  BP: (!) 154/74  Pulse: (!) 50  Resp: 20  Temp: 98.2 F (36.8 C)  SpO2: 96%  Weight: 121 lb (54.9 kg)  Height: 5' 2" (1.575 m)   Body mass index is 22.13 kg/m. Physical Exam  Labs reviewed:  Recent Labs  12/07/14 1759 12/10/14 1546 01/07/15  NA 136 138 138  K 3.6 3.2* 4.0  CL 97* 101  --   CO2 25 25  --   GLUCOSE 118* 92  --   BUN _0 CREATININE 0.69 0.80 0.6  CALCIUM 9.1 9.2  --     Recent Labs  12/07/14 1759 12/10/14 1546  AST 21 24  ALT 14 19  ALKPHOS 77 76  BILITOT 0.6 0.8  PROT 7.1 7.9  ALBUMIN 4.4 5.0    Recent Labs  12/07/14 1707 12/10/14 1546  WBC 9.1 8.4  NEUTROABS  --  4.9  HGB 13.3 14.2  HCT 38.5 41.3  MCV 97.9* 99.8  PLT  --  438*    Lab Results  Component Value Date   HGBA1C 5.6 12/11/2014   Lab Results  Component Value Date   CHOL 178 12/11/2014   HDL 60 12/11/2014   LDLCALC 97 12/11/2014   TRIG 106 12/11/2014   CHOLHDL 3.0 12/11/2014    Significant Diagnostic Results in last 30 days:  No results found.  Assessment/Plan There are no diagnoses linked to this encounter. Essential hypertension Blood pressure is controlled, continue Amlodipine 68m daily, Losartan 1069mdaily, Metoprolol 2548maily. Update CMP   Constipation Stable, continue Senakot I daily  Hallucinations Stable, continue Risperdal 0.2m bid. Observe for negative AR of Risperdal. Update CBC and TSH   Depression, major ([place] Her mood is stable, continue Risperdal 0.538mbid, Celexa 1033mEdema Trace edema in ankles, no increased SOB, cough, or phlegm production.       Family/ staff Communication: continue AL for care needs.   Labs/tests ordered:  CBC, CMP, TSH

## 2015-08-17 NOTE — Assessment & Plan Note (Signed)
Blood pressure is controlled, continue Amlodipine 10mg  daily, Losartan 100mg  daily, Metoprolol 25mg  daily. Update CMP

## 2015-08-19 DIAGNOSIS — I10 Essential (primary) hypertension: Secondary | ICD-10-CM | POA: Diagnosis not present

## 2015-08-19 DIAGNOSIS — D649 Anemia, unspecified: Secondary | ICD-10-CM | POA: Diagnosis not present

## 2015-08-19 DIAGNOSIS — Z1329 Encounter for screening for other suspected endocrine disorder: Secondary | ICD-10-CM | POA: Diagnosis not present

## 2015-09-02 ENCOUNTER — Encounter: Payer: Self-pay | Admitting: Nurse Practitioner

## 2015-09-02 ENCOUNTER — Non-Acute Institutional Stay: Payer: Medicare Other | Admitting: Nurse Practitioner

## 2015-09-02 DIAGNOSIS — R609 Edema, unspecified: Secondary | ICD-10-CM | POA: Diagnosis not present

## 2015-09-02 DIAGNOSIS — N390 Urinary tract infection, site not specified: Secondary | ICD-10-CM | POA: Diagnosis not present

## 2015-09-02 DIAGNOSIS — F339 Major depressive disorder, recurrent, unspecified: Secondary | ICD-10-CM | POA: Diagnosis not present

## 2015-09-02 DIAGNOSIS — R443 Hallucinations, unspecified: Secondary | ICD-10-CM

## 2015-09-02 DIAGNOSIS — I10 Essential (primary) hypertension: Secondary | ICD-10-CM

## 2015-09-02 DIAGNOSIS — K5900 Constipation, unspecified: Secondary | ICD-10-CM

## 2015-09-02 NOTE — Assessment & Plan Note (Addendum)
Blood pressure is controlled, continue Amlodipine 10mg  daily, Losartan 100mg  daily, Metoprolol 25mg  daily. Furosemide 20mg  daily. 08/19/15 Hgb 12.4, Na 139, K 4.2, Bun 11, creat 0.41, TSH 4.15

## 2015-09-02 NOTE — Assessment & Plan Note (Signed)
Her mood is stable, continue Risperdal 0.5mg  bid, Celexa 10mg 

## 2015-09-02 NOTE — Assessment & Plan Note (Signed)
Stable, continue Risperdal 0.5mg  bid. Observe for negative AR of Risperdal.

## 2015-09-02 NOTE — Progress Notes (Signed)
Location:   [facility] Room Number: AL 8 Place of Service:  SNF (31) Provider:  Marlana Latus  NP   Patient Care Team: Estill Dooms, MD as PCP - General (Internal Medicine) [facility] Gaynelle Arabian, MD as Consulting Physician (Orthopedic Surgery) [place] Otho Darner, NP as Nurse Practitioner (Internal Medicine)  Extended Emergency Contact Information Primary Emergency Contact: Peri [place] Mobile Phone: [phone] Relation: Daughter Secondary Emergency Contact: [facility] Phone: [phone] Relation: Other  Code Status:  DNR Goals of care: Advanced Directive information Advanced Directives 09/02/2015  Does patient have an advance directive? Yes  Type of Paramedic of [place];Living will;Out of facility DNR (pink MOST or yellow form)  Does patient want to make changes to advanced directive? No - Patient declined  Copy of advanced directive(s) in chart? Yes     Chief Complaint  Patient presents with  . Acute Visit    BLE (edema)    HPI:  Pt is a 82 y.o. female seen today for an acute visit for BLE edema, warm, mild erythema, denied pain   Hx of hallucination, insomnia, anxiety, stable while on Risperdal 0.25mg  bid, Celexa 10mg ,  HTN is controlled, taking Losartan, Metoprolol, and Amlodipine. She is functioning adequately in AL setting.   Past Medical History:  Diagnosis Date  . Abnormality of gait 03/15/2010  . Anxiety state, unspecified 03/15/2010  . Depression, major ([place]) 06/15/2015  . Depressive disorder, not elsewhere classified 03/15/2010  . Dysphagia, unspecified(787.20) 03/15/2010  . Generalized anxiety disorder 06/23/2014  . Herpes zoster without mention of complication [id number]  . Other alteration of consciousness 03/15/2010  . Pain in hand 08/13/12  . Personal history of fall 03/15/2010  . Senile osteoporosis 03/15/2010  . Unspecified constipation 03/15/2010  . Unspecified essential  hypertension 03/15/2010  . Unspecified hearing loss 08/22/2011  . Unspecified urinary incontinence 03/15/2010  . Urinary incontinence 03/15/2010  . Vaginal prolapse 06/15/2015   Past Surgical History:  Procedure Laterality Date  . INCISION / DRAINAGE HAND / FINGER Right 06/2008   hand  . SPINE SURGERY  12/2006    Allergies  Allergen Reactions  . Ace Inhibitors Other (See Comments)    Reaction unknown  . Hydrochlorothiazide Other (See Comments)    Reaction unknown  . Morphine And Related Other (See Comments)    Hallucinations      Medication List       Accurate as of 09/02/15  1:10 PM. Always use your most recent med list.          amLODipine 10 MG tablet Commonly known as:  NORVASC TAKE (1) TABLET DAILY FOR HIGH BLOOD PRESSURE.   aspirin EC 81 MG tablet Take 1 tablet (81 mg total) by mouth daily.   CALCIUM 600+D 600-200 MG-UNIT Tabs Generic drug:  Calcium Carbonate-Vitamin D Take 1 tablet by mouth daily.   citalopram 10 MG tablet Commonly known as:  CELEXA One daily to help depression and anxiety   ibuprofen 200 MG tablet Commonly known as:  ADVIL,MOTRIN Take 400 mg by mouth every 6 (six) hours as needed (For headache, pain or fever.). Take one as needed   losartan 100 MG tablet Commonly known as:  COZAAR TAKE (1) TABLET DAILY FOR HIGH BLOOD PRESSURE.   metoprolol succinate 25 MG 24 hr tablet Commonly known as:  TOPROL-XL TAKE 1 TABLET ONCE DAILY TO CONTROL BLOOD PRESSURE.   risperiDONE 0.5 MG tablet Commonly known as:  RISPERDAL One at bedtime  to prevent hallucinations and anxiety   senna 8.6 MG tablet Commonly known as:  SENOKOT Take 1 tablet by mouth. Take 2 at bedtime for constipation       Review of Systems  Constitutional: Negative for activity change, appetite change and fatigue.  HENT: Positive for hearing loss. Negative for ear pain.   Eyes: Negative.   Respiratory: Negative.  Negative for shortness of breath.   Cardiovascular: Positive for  leg swelling. Negative for chest pain and palpitations.       HTN  Gastrointestinal: Negative.   Endocrine: Negative.   Genitourinary:       Incontinence  Musculoskeletal: Positive for arthralgias (in feet).       Pain in both hands: R>L.  Skin: Negative.   Neurological: Negative.   Hematological: Negative.   Psychiatric/Behavioral: Positive for confusion and sleep disturbance (Wakes at 3 AM. Full of energy then. Uses Motrin PM to get back to sleep. ). The patient is nervous/anxious.        Hx of hallucinations.    Immunization History  Administered Date(s) Administered  . Influenza Whole 10/24/2011, 10/24/2012  . Influenza-Unspecified 11/06/2013  . PPD Test 01/11/2015  . Pneumococcal Polysaccharide-23 01/23/2009  . Td 01/23/2009   Pertinent  Health Maintenance Due  Topic Date Due  . DEXA SCAN  07/26/1984  . PNA vac Low Risk Adult (2 of 2 - PCV13) 01/23/2010  . INFLUENZA VACCINE  08/24/2015   Fall Risk  06/15/2015 01/05/2015 12/29/2014 12/29/2014 06/23/2014  Falls in the past year? No No No No Yes  Number falls in past yr: - - - - 1  Injury with Fall? - - - - No   Functional Status Survey:    Vitals:   09/02/15 1034  BP: (!) 176/73  Pulse: 78  Resp: 17  Temp: 97 F (36.1 C)  Weight: 121 lb (54.9 kg)  Height: 5\' 2"  (1.575 m)   Body mass index is 22.13 kg/m. Physical Exam  Constitutional: She is oriented to person, place, and time. She appears well-developed and well-nourished. No distress.  HENT:  Head: Normocephalic and atraumatic.  Partial hearing loss.  Eyes: Conjunctivae and EOM are normal. Pupils are equal, round, and reactive to light.  Neck: Normal range of motion. Neck supple. No JVD present. No tracheal deviation present. No thyromegaly present.  Cardiovascular: Normal rate, regular rhythm and normal heart sounds.  Exam reveals no gallop and no friction rub.   No murmur heard. Pulmonary/Chest: No respiratory distress. She has no wheezes. She has no rales.  She exhibits no tenderness.  Abdominal: Bowel sounds are normal. She exhibits no distension and no mass. There is no tenderness.  Genitourinary: Rectal exam shows guaiac negative stool.  Genitourinary Comments: Examined 06/15/15: Firm area at the top of the vagina. Prolapse of the vaginal walls. Loss of rectal sphincter tone.  Musculoskeletal: Normal range of motion. She exhibits edema. She exhibits no tenderness.  Pain in hands. Right 3rd trigger finger. Uses walker due to unstable gait. Left 2nd toe is deviated to the great toe and has elevated DIP joint that rubs on her shoe. 2+ edema R+L ankles  Lymphadenopathy:    She has no cervical adenopathy.  Neurological: She is alert and oriented to person, place, and time. No cranial nerve deficit. Coordination normal.  12/29/14 MMSE 26/30 . Failed clock drawing.  Skin: No rash noted. No erythema. No pallor.  Psychiatric: She has a normal mood and affect. Her behavior is normal. Judgment and thought content  normal.    Labs reviewed:  Recent Labs  12/07/14 1759 12/10/14 1546 01/07/15  NA 136 138 138  K 3.6 3.2* 4.0  CL 97* 101  --   CO2 25 25  --   GLUCOSE 118* 92  --   BUN 21 17 17   CREATININE 0.69 0.80 0.6  CALCIUM 9.1 9.2  --     Recent Labs  12/07/14 1759 12/10/14 1546  AST 21 24  ALT 14 19  ALKPHOS 77 76  BILITOT 0.6 0.8  PROT 7.1 7.9  ALBUMIN 4.4 5.0    Recent Labs  12/07/14 1707 12/10/14 1546  WBC 9.1 8.4  NEUTROABS  --  4.9  HGB 13.3 14.2  HCT 38.5 41.3  MCV 97.9* 99.8  PLT  --  438*    Lab Results  Component Value Date   HGBA1C 5.6 12/11/2014   Lab Results  Component Value Date   CHOL 178 12/11/2014   HDL 60 12/11/2014   LDLCALC 97 12/11/2014   TRIG 106 12/11/2014   CHOLHDL 3.0 12/11/2014    Significant Diagnostic Results in last 30 days:  No results found.  Assessment/Plan There are no diagnoses linked to this encounter.Edema worsened edema in BLE, 2+, no increased SOB, cough, or phlegm  production, will try Furosemide 20mg  daily, BMP one week    Essential hypertension Blood pressure is controlled, continue Amlodipine 10mg  daily, Losartan 100mg  daily, Metoprolol 25mg  daily. Furosemide 20mg  daily. 08/19/15 Hgb 12.4, Na 139, K 4.2, Bun 11, creat 0.41, TSH 4.15      Constipation Stable, continue Senakot I daily  Hallucinations Stable, continue Risperdal 0.5mg  bid. Observe for negative AR of Risperdal.     Depression, major ([place]) Her mood is stable, continue Risperdal 0.5mg  bid, Celexa 10mg       Family/ staff Communication: continue AL for care assistance  Labs/tests ordered:  BMP

## 2015-09-02 NOTE — Assessment & Plan Note (Signed)
Stable, continue Senakot I daily

## 2015-09-02 NOTE — Assessment & Plan Note (Signed)
worsened edema in BLE, 2+, no increased SOB, cough, or phlegm production, will try Furosemide 20mg  daily, BMP one week

## 2015-09-07 DIAGNOSIS — N390 Urinary tract infection, site not specified: Secondary | ICD-10-CM | POA: Diagnosis not present

## 2015-09-09 ENCOUNTER — Other Ambulatory Visit: Payer: Self-pay | Admitting: *Deleted

## 2015-09-09 LAB — BASIC METABOLIC PANEL
BUN: 23 mg/dL — AB (ref 4–21)
Creatinine: 0.8 mg/dL (ref <=1.1)
Glucose: 93 mg/dL
Potassium: 3.4 mmol/L (ref 3.4–5.3)
SODIUM: 139 mmol/L (ref 137–147)

## 2015-09-14 ENCOUNTER — Non-Acute Institutional Stay: Payer: Medicare Other | Admitting: Nurse Practitioner

## 2015-09-14 ENCOUNTER — Encounter: Payer: Self-pay | Admitting: Nurse Practitioner

## 2015-09-14 DIAGNOSIS — F324 Major depressive disorder, single episode, in partial remission: Secondary | ICD-10-CM

## 2015-09-14 DIAGNOSIS — E876 Hypokalemia: Secondary | ICD-10-CM | POA: Diagnosis not present

## 2015-09-14 DIAGNOSIS — R443 Hallucinations, unspecified: Secondary | ICD-10-CM

## 2015-09-14 DIAGNOSIS — N390 Urinary tract infection, site not specified: Secondary | ICD-10-CM

## 2015-09-14 DIAGNOSIS — I10 Essential (primary) hypertension: Secondary | ICD-10-CM | POA: Diagnosis not present

## 2015-09-14 DIAGNOSIS — K5900 Constipation, unspecified: Secondary | ICD-10-CM | POA: Diagnosis not present

## 2015-09-14 DIAGNOSIS — R609 Edema, unspecified: Secondary | ICD-10-CM

## 2015-09-14 NOTE — Assessment & Plan Note (Signed)
09/09/15 Na 139, K 3.4, Bun 23, creat 0.82

## 2015-09-14 NOTE — Assessment & Plan Note (Signed)
09/10/15 urine culture E. Coli >100,000c/ml 09/11/15 7 day course Cipro 500mg  bid.

## 2015-09-14 NOTE — Assessment & Plan Note (Addendum)
Improved since Furosemide 20mg  daily. 09/09/15 Na 139, K 3.4, Bun 23, creat 0.82

## 2015-09-14 NOTE — Assessment & Plan Note (Signed)
Her mood is stable, continue Risperdal 0.5mg  bid, Celexa 10mg 

## 2015-09-14 NOTE — Assessment & Plan Note (Addendum)
Blood pressure is controlled, continue Amlodipine 10mg  daily, Losartan 100mg  daily, Metoprolol 25mg  daily. Furosemide 20mg  daily. 08/19/15 Hgb 12.4, Na 139, K 4.2, Bun 11, creat 0.41, TSH 4.15, 09/09/15 Na 139, K 3.4, Bun 23, creat 0.82

## 2015-09-14 NOTE — Assessment & Plan Note (Signed)
Stable, 09/07/15 dc Colace, start MiraLax daily prn.

## 2015-09-14 NOTE — Progress Notes (Signed)
Location:   [place] Room Number: 8 Place of Service:  ALF [phone]) Provider:  Sarinity Dicicco, Manxie  NP  Jeanmarie Hubert, MD  Patient Care Team: Estill Dooms, MD as PCP - General (Internal Medicine) [facility] Gaynelle Arabian, MD as Consulting Physician (Orthopedic Surgery) [place] Otho Darner, NP as Nurse Practitioner (Internal Medicine)  Extended Emergency Contact Information Primary Emergency Contact: Peri [place] Mobile Phone: [phone] Relation: Daughter Secondary Emergency Contact: [facility] Phone: [phone] Relation: Other  Code Status:  DNR Goals of care: Advanced Directive information Advanced Directives 09/14/2015  Does patient have an advance directive? Yes  Type of Advance Directive Living will;Out of facility DNR (pink MOST or yellow form);Healthcare Power of Attorney  Does patient want to make changes to advanced directive? No - Patient declined  Copy of advanced directive(s) in chart? Yes     Chief Complaint  Patient presents with  . Acute Visit    UTI    HPI:  Pt is a 82 y.o. female seen today for an acute visit for UTI, urine culture 09/10/15 showed E Coli >100,000c/ml, subsequently Cipro 500mg  bid x 7 days started 09/11/15, tolerated and improving   Hx of hallucination, insomnia, anxiety, stable while on Risperdal 0.25mg  bid, Celexa 10mg , HTN is controlled, taking Losartan, Metoprolol, and Amlodipine. She is functioning adequately in AL setting.    Past Medical History:  Diagnosis Date  . Abnormality of gait 03/15/2010  . Anxiety state, unspecified 03/15/2010  . Depression, major ([place]) 06/15/2015  . Depressive disorder, not elsewhere classified 03/15/2010  . Dysphagia, unspecified(787.20) 03/15/2010  . Generalized anxiety disorder 06/23/2014  . Herpes zoster without mention of complication [id number]  . Other alteration of consciousness 03/15/2010  . Pain in hand 08/13/12  . Personal history of fall 03/15/2010   . Senile osteoporosis 03/15/2010  . Unspecified constipation 03/15/2010  . Unspecified essential hypertension 03/15/2010  . Unspecified hearing loss 08/22/2011  . Unspecified urinary incontinence 03/15/2010  . Urinary incontinence 03/15/2010  . Vaginal prolapse 06/15/2015   Past Surgical History:  Procedure Laterality Date  . INCISION / DRAINAGE HAND / FINGER Right 06/2008   hand  . SPINE SURGERY  12/2006    Allergies  Allergen Reactions  . Ace Inhibitors Other (See Comments)    Reaction unknown  . Hydrochlorothiazide Other (See Comments)    Reaction unknown  . Morphine And Related Other (See Comments)    Hallucinations      Medication List       Accurate as of 09/14/15 12:36 PM. Always use your most recent med list.          amLODipine 10 MG tablet Commonly known as:  NORVASC TAKE (1) TABLET DAILY FOR HIGH BLOOD PRESSURE.   aspirin EC 81 MG tablet Take 1 tablet (81 mg total) by mouth daily.   CALCIUM 600+D 600-200 MG-UNIT Tabs Generic drug:  Calcium Carbonate-Vitamin D Take 1 tablet by mouth daily.   ciprofloxacin 500 MG tablet Commonly known as:  CIPRO Take 500 mg by mouth 2 (two) times daily.   citalopram 10 MG tablet Commonly known as:  CELEXA One daily to help depression and anxiety   furosemide 20 MG tablet Commonly known as:  LASIX Take 20 mg by mouth daily.   ibuprofen 200 MG tablet Commonly known as:  ADVIL,MOTRIN Take 400 mg by mouth every 6 (six) hours as needed (For headache, pain or fever.). Take one as needed   losartan 100 MG tablet  Commonly known as:  COZAAR TAKE (1) TABLET DAILY FOR HIGH BLOOD PRESSURE.   metoprolol succinate 25 MG 24 hr tablet Commonly known as:  TOPROL-XL TAKE 1 TABLET ONCE DAILY TO CONTROL BLOOD PRESSURE.   polyethylene glycol packet Commonly known as:  MIRALAX / GLYCOLAX Take 17 g by mouth daily.   PREPARATION H 0.25-14-74.9 % rectal ointment Generic drug:  phenylephrine-shark liver oil-mineral  oil-petrolatum Place 1 application rectally 2 (two) times daily as needed for hemorrhoids.   PROCTO-PAK 1 % Crea Generic drug:  hydrocortisone Apply topically 2 (two) times daily as needed.   risperiDONE 0.5 MG tablet Commonly known as:  RISPERDAL One at bedtime to prevent hallucinations and anxiety   saccharomyces boulardii 250 MG capsule Commonly known as:  FLORASTOR Take 250 mg by mouth 2 (two) times daily.       Review of Systems  Constitutional: Negative for activity change, appetite change and fatigue.  HENT: Positive for hearing loss. Negative for ear pain.   Eyes: Negative.   Respiratory: Negative.  Negative for shortness of breath.   Cardiovascular: Positive for leg swelling. Negative for chest pain and palpitations.       HTN  Gastrointestinal: Negative.   Endocrine: Negative.   Genitourinary:       Incontinence  Musculoskeletal: Positive for arthralgias (in feet).       Pain in both hands: R>L.  Skin: Negative.   Neurological: Negative.   Hematological: Negative.   Psychiatric/Behavioral: Positive for confusion and sleep disturbance (Wakes at 3 AM. Full of energy then. Uses Motrin PM to get back to sleep. ). The patient is nervous/anxious.        Hx of hallucinations.    Immunization History  Administered Date(s) Administered  . Influenza Whole 10/24/2011, 10/24/2012  . Influenza-Unspecified 11/06/2013  . PPD Test 01/11/2015  . Pneumococcal Polysaccharide-23 01/23/2009  . Td 01/23/2009   Pertinent  Health Maintenance Due  Topic Date Due  . DEXA SCAN  07/26/1984  . PNA vac Low Risk Adult (2 of 2 - PCV13) 01/23/2010  . INFLUENZA VACCINE  08/24/2015   Fall Risk  06/15/2015 01/05/2015 12/29/2014 12/29/2014 06/23/2014  Falls in the past year? No No No No Yes  Number falls in past yr: - - - - 1  Injury with Fall? - - - - No   Functional Status Survey:    Vitals:   09/14/15 1204  BP: 132/68  Pulse: (!) 57  Resp: 18  Temp: 98.3 F (36.8 C)  Weight:  121 lb (54.9 kg)  Height: 5\' 2"  (1.575 m)   Body mass index is 22.13 kg/m. Physical Exam  Constitutional: She is oriented to person, place, and time. She appears well-developed and well-nourished. No distress.  HENT:  Head: Normocephalic and atraumatic.  Partial hearing loss.  Eyes: Conjunctivae and EOM are normal. Pupils are equal, round, and reactive to light.  Neck: Normal range of motion. Neck supple. No JVD present. No tracheal deviation present. No thyromegaly present.  Cardiovascular: Normal rate, regular rhythm and normal heart sounds.  Exam reveals no gallop and no friction rub.   No murmur heard. Pulmonary/Chest: No respiratory distress. She has no wheezes. She has no rales. She exhibits no tenderness.  Abdominal: Bowel sounds are normal. She exhibits no distension and no mass. There is no tenderness.  Genitourinary: Rectal exam shows guaiac negative stool.  Genitourinary Comments: Examined 06/15/15: Firm area at the top of the vagina. Prolapse of the vaginal walls. Loss of rectal sphincter tone.  Musculoskeletal: Normal range of motion. She exhibits edema. She exhibits no tenderness.  Pain in hands. Right 3rd trigger finger. Uses walker due to unstable gait. Left 2nd toe is deviated to the great toe and has elevated DIP joint that rubs on her shoe. 2+ edema R+L ankles  Lymphadenopathy:    She has no cervical adenopathy.  Neurological: She is alert and oriented to person, place, and time. No cranial nerve deficit. Coordination normal.  12/29/14 MMSE 26/30 . Failed clock drawing.  Skin: No rash noted. No erythema. No pallor.  Psychiatric: She has a normal mood and affect. Her behavior is normal. Judgment and thought content normal.    Labs reviewed:  Recent Labs  12/07/14 1759 12/10/14 1546 01/07/15 09/09/15  NA 136 138 138 139  K 3.6 3.2* 4.0 3.4  CL 97* 101  --   --   CO2 25 25  --   --   GLUCOSE 118* 92  --   --   BUN 21 17 17  23*  CREATININE 0.69 0.80 0.6 0.8   CALCIUM 9.1 9.2  --   --     Recent Labs  12/07/14 1759 12/10/14 1546  AST 21 24  ALT 14 19  ALKPHOS 77 76  BILITOT 0.6 0.8  PROT 7.1 7.9  ALBUMIN 4.4 5.0    Recent Labs  12/07/14 1707 12/10/14 1546  WBC 9.1 8.4  NEUTROABS  --  4.9  HGB 13.3 14.2  HCT 38.5 41.3  MCV 97.9* 99.8  PLT  --  438*    Lab Results  Component Value Date   HGBA1C 5.6 12/11/2014   Lab Results  Component Value Date   CHOL 178 12/11/2014   HDL 60 12/11/2014   LDLCALC 97 12/11/2014   TRIG 106 12/11/2014   CHOLHDL 3.0 12/11/2014    Significant Diagnostic Results in last 30 days:  No results found.  Assessment/Plan Urinary tract infection, site not specified 09/10/15 urine culture E. Coli >100,000c/ml 09/11/15 7 day course Cipro 500mg  bid.    Essential hypertension Blood pressure is controlled, continue Amlodipine 10mg  daily, Losartan 100mg  daily, Metoprolol 25mg  daily. Furosemide 20mg  daily. 08/19/15 Hgb 12.4, Na 139, K 4.2, Bun 11, creat 0.41, TSH 4.15, 09/09/15 Na 139, K 3.4, Bun 23, creat 0.82     Constipation Stable, 09/07/15 dc Colace, start MiraLax daily prn.    Hallucinations Stable, continue Risperdal 0.5mg  bid. Observe for negative AR of Risperdal.      Depression, major ([place]) Her mood is stable, continue Risperdal 0.5mg  bid, Celexa 10mg     Hypokalemia 09/09/15 Na 139, K 3.4, Bun 23, creat 0.82   Edema Improved since Furosemide 20mg  daily. 09/09/15 Na 139, K 3.4, Bun 23, creat 0.82          Family/ staff Communication: continue AL for care assistance.   Labs/tests ordered:  Urine culture done 09/10/15

## 2015-09-14 NOTE — Assessment & Plan Note (Signed)
Stable, continue Risperdal 0.5mg  bid. Observe for negative AR of Risperdal.

## 2015-09-21 ENCOUNTER — Encounter: Payer: Self-pay | Admitting: Internal Medicine

## 2015-09-21 ENCOUNTER — Non-Acute Institutional Stay: Payer: Medicare Other | Admitting: Internal Medicine

## 2015-09-21 VITALS — BP 146/64 | HR 60 | Temp 97.4°F | Ht 62.0 in | Wt 122.0 lb

## 2015-09-21 DIAGNOSIS — N390 Urinary tract infection, site not specified: Secondary | ICD-10-CM | POA: Diagnosis not present

## 2015-09-21 DIAGNOSIS — R609 Edema, unspecified: Secondary | ICD-10-CM

## 2015-09-21 DIAGNOSIS — R443 Hallucinations, unspecified: Secondary | ICD-10-CM | POA: Diagnosis not present

## 2015-09-21 DIAGNOSIS — I10 Essential (primary) hypertension: Secondary | ICD-10-CM | POA: Diagnosis not present

## 2015-09-21 DIAGNOSIS — R21 Rash and other nonspecific skin eruption: Secondary | ICD-10-CM | POA: Diagnosis not present

## 2015-09-21 DIAGNOSIS — F324 Major depressive disorder, single episode, in partial remission: Secondary | ICD-10-CM

## 2015-09-21 NOTE — Progress Notes (Signed)
[place] Room Number: AL08  Place of Service: Clinic (12)     Allergies  Allergen Reactions  . Ace Inhibitors Other (See Comments)    Reaction unknown  . Hydrochlorothiazide Other (See Comments)    Reaction unknown  . Morphine And Related Other (See Comments)    Hallucinations    Chief Complaint  Patient presents with  . Medical Management of Chronic Issues    3 month blood pressure, edema, UTI, depression    HPI:  Essential hypertension - Mild elevation in systolic blood pressure today. Other pressures in her record are normal.  Edema, unspecified type - 2+ bipedal. Improving. Patient says her feet fit her shoes.  Urinary tract infection, site not specified - recent Escherichia coli infection. Finishing antibiotics today. 02 urine status.. Denies dysuria.  Hallucinations - hallucinations recently.  Major depressive disorder with single episode, in partial remission ([place]) - patient missed to some depressed feelings in the evenings.  Rash - erythema and intense itching of the lower legs from just below the knees into the feet.-    Medications: Patient's Medications  New Prescriptions   No medications on file  Previous Medications   AMLODIPINE (NORVASC) 10 MG TABLET    TAKE (1) TABLET DAILY FOR HIGH BLOOD PRESSURE.   ASPIRIN EC 81 MG TABLET    Take 1 tablet (81 mg total) by mouth daily.   CALCIUM CARBONATE-VITAMIN D (CALCIUM 600+D) 600-200 MG-UNIT TABS    Take 1 tablet by mouth daily.    CITALOPRAM (CELEXA) 10 MG TABLET    One daily to help depression and anxiety   HYDROCORTISONE (PROCTO-PAK) 1 % CREA    Apply topically 2 (two) times daily as needed.   IBUPROFEN (ADVIL,MOTRIN) 200 MG TABLET    Take 400 mg by mouth every 6 (six) hours as needed (For headache, pain or fever.). Take one as needed   LOSARTAN (COZAAR) 100 MG TABLET    TAKE (1) TABLET DAILY FOR HIGH BLOOD PRESSURE.   METOPROLOL SUCCINATE (TOPROL-XL) 25 MG 24 HR TABLET    TAKE 1 TABLET  ONCE DAILY TO CONTROL BLOOD PRESSURE.   PHENYLEPHRINE-SHARK LIVER OIL-MINERAL OIL-PETROLATUM (PREPARATION H) 0.25-14-74.9 % RECTAL OINTMENT    Place 1 application rectally 2 (two) times daily as needed for hemorrhoids.   POLYETHYLENE GLYCOL (MIRALAX / GLYCOLAX) PACKET    Take 17 g by mouth daily.   RISPERIDONE (RISPERDAL) 0.5 MG TABLET    One at bedtime to prevent hallucinations and anxiety  Modified Medications   No medications on file  Discontinued Medications   FUROSEMIDE (LASIX) 20 MG TABLET    Take 20 mg by mouth daily.     Review of Systems  Constitutional: Negative for activity change, appetite change and fatigue.  HENT: Positive for hearing loss. Negative for ear pain.   Eyes: Negative.   Respiratory: Negative.  Negative for shortness of breath.   Cardiovascular: Positive for leg swelling. Negative for chest pain and palpitations.       HTN  Gastrointestinal: Negative.   Endocrine: Negative.   Genitourinary:       Incontinence  Musculoskeletal: Positive for arthralgias (in feet).       Pain in both hands: R>L.  Skin: Positive for rash (Erythematous rash of the lower legs nonspecific nature).  Neurological: Negative.   Hematological: Negative.   Psychiatric/Behavioral: Positive for confusion and sleep disturbance (Wakes at 3 AM. Full of energy then. Uses Motrin PM to get back to sleep. ).  The patient is nervous/anxious.        Hx of hallucinations.    Vitals:   09/21/15 0943  BP: (!) 146/64  Pulse: 60  Temp: 97.4 F (36.3 C)  TempSrc: Oral  SpO2: 96%  Weight: 122 lb (55.3 kg)  Height: 5' 2"  (1.575 m)   Wt Readings from Last 3 Encounters:  09/21/15 122 lb (55.3 kg)  09/14/15 121 lb (54.9 kg)  09/02/15 121 lb (54.9 kg)    Body mass index is 22.31 kg/m.  Physical Exam  Constitutional: She appears well-developed and well-nourished. No distress.  HENT:  Head: Normocephalic and atraumatic.  Severe hearing loss bilaterally. Wearing hearing aids.  Eyes:  Conjunctivae and EOM are normal. Pupils are equal, round, and reactive to light.  Neck: Normal range of motion. Neck supple. No JVD present. No tracheal deviation present. No thyromegaly present.  Cardiovascular: Normal rate, regular rhythm and normal heart sounds.  Exam reveals no gallop and no friction rub.   No murmur heard. Pulmonary/Chest: No respiratory distress. She has no wheezes. She has no rales. She exhibits no tenderness.  Abdominal: Bowel sounds are normal. She exhibits no distension and no mass. There is no tenderness.  Genitourinary: Rectal exam shows guaiac negative stool.  Genitourinary Comments: Examined 06/15/15: Firm area at the top of the vagina. Prolapse of the vaginal walls. Loss of rectal sphincter tone.  Musculoskeletal: Normal range of motion. She exhibits edema. She exhibits no tenderness.  Pain in hands. Right 3rd trigger finger. Uses walker due to unstable gait. Left 2nd toe is deviated to the great toe and has elevated DIP joint that rubs on her shoe. 2+ edema R+L ankles  Lymphadenopathy:    She has no cervical adenopathy.  Neurological: She is alert. No cranial nerve deficit. Coordination normal.  12/29/14 MMSE 26/30 . Failed clock drawing.  Skin: Rash (Nonspecific erythematous rash in both lower legs.) noted. No erythema. No pallor.  Psychiatric: She has a normal mood and affect. Her behavior is normal.     Labs reviewed: Lab Summary Latest Ref Rng & Units 09/09/2015 01/07/2015 12/11/2014 12/10/2014 12/07/2014  Hemoglobin 12.0 - 15.0 g/dL (None) (None) (None) 14.2 13.3  Hematocrit 36.0 - 46.0 % (None) (None) (None) 41.3 38.5  White count 4.0 - 10.5 K/uL (None) (None) (None) 8.4 9.1  Platelet count 150 - 400 K/uL (None) (None) (None) 438(H) (None)  Sodium 137 - 147 mmol/L 139 138 (None) 138 136  Potassium 3.4 - 5.3 mmol/L 3.4 4.0 (None) 3.2(L) 3.6  Calcium 8.9 - 10.3 mg/dL (None) (None) (None) 9.2 9.1  Phosphorus - (None) (None) (None) (None) (None)    Creatinine 0.5 - 1.1 mg/dL 0.8 0.6 (None) 0.80 0.69  AST 15 - 41 U/L (None) (None) (None) 24 21  Alk Phos 38 - 126 U/L (None) (None) (None) 76 77  Bilirubin 0.3 - 1.2 mg/dL (None) (None) (None) 0.8 0.6  Glucose mg/dL 93 78 (None) 92 118(H)  Cholesterol 0 - 200 mg/dL (None) (None) 178 (None) (None)  HDL cholesterol >40 mg/dL (None) (None) 60 (None) (None)  Triglycerides <150 mg/dL (None) (None) 106 (None) (None)  LDL Direct - (None) (None) (None) (None) (None)  LDL Calc 0 - 99 mg/dL (None) (None) 97 (None) (None)  Total protein 6.5 - 8.1 g/dL (None) (None) (None) 7.9 7.1  Albumin 3.5 - 5.0 g/dL (None) (None) (None) 5.0 4.4  Some recent data might be hidden   Lab Results  Component Value Date   TSH 2.138 *Test methodology is  3rd generation TSH* 05/27/2008   Lab Results  Component Value Date   BUN 23 (A) 09/09/2015   BUN 17 01/07/2015   BUN 17 12/10/2014   Lab Results  Component Value Date   CREATININE 0.8 09/09/2015   CREATININE 0.6 01/07/2015   CREATININE 0.80 12/10/2014   Lab Results  Component Value Date   HGBA1C 5.6 12/11/2014       Assessment/Plan  1. Essential hypertension Continue current medication  2. Edema, unspecified type Continue current medication  3. Urinary tract infection, site not specified Resolved  4. Hallucinations Improved  5. Major depressive disorder with single episode, in partial remission ([place]) Continue current medication  6. Rash -Add triamcinolone acetonide cream 0.1% daily to the lower legs 30 days.

## 2015-10-14 DIAGNOSIS — I10 Essential (primary) hypertension: Secondary | ICD-10-CM | POA: Diagnosis not present

## 2015-10-14 LAB — BASIC METABOLIC PANEL
BUN: 25 mg/dL — AB (ref 4–21)
Creatinine: 1 mg/dL (ref <=1.1)
Glucose: 86 mg/dL
Potassium: 4.4 mmol/L (ref 3.4–5.3)
Sodium: 139 mmol/L (ref 137–147)

## 2015-10-19 ENCOUNTER — Encounter: Payer: Self-pay | Admitting: *Deleted

## 2015-10-19 ENCOUNTER — Other Ambulatory Visit: Payer: Self-pay | Admitting: *Deleted

## 2015-10-19 NOTE — Progress Notes (Signed)
This encounter was created in error - please disregard.

## 2015-11-02 ENCOUNTER — Encounter: Payer: Self-pay | Admitting: Nurse Practitioner

## 2015-11-02 ENCOUNTER — Non-Acute Institutional Stay: Payer: Medicare Other | Admitting: Nurse Practitioner

## 2015-11-02 DIAGNOSIS — I10 Essential (primary) hypertension: Secondary | ICD-10-CM

## 2015-11-02 DIAGNOSIS — E876 Hypokalemia: Secondary | ICD-10-CM

## 2015-11-02 DIAGNOSIS — F411 Generalized anxiety disorder: Secondary | ICD-10-CM | POA: Diagnosis not present

## 2015-11-02 DIAGNOSIS — F339 Major depressive disorder, recurrent, unspecified: Secondary | ICD-10-CM

## 2015-11-02 DIAGNOSIS — R609 Edema, unspecified: Secondary | ICD-10-CM

## 2015-11-02 DIAGNOSIS — K5900 Constipation, unspecified: Secondary | ICD-10-CM | POA: Diagnosis not present

## 2015-11-02 DIAGNOSIS — R443 Hallucinations, unspecified: Secondary | ICD-10-CM

## 2015-11-02 DIAGNOSIS — F5101 Primary insomnia: Secondary | ICD-10-CM | POA: Diagnosis not present

## 2015-11-02 NOTE — Progress Notes (Signed)
Location:  [place] Room Number: 08 Place of Service:  ALF [phone]) Provider:  Tiffany Calmes, [place] NP  Jeanmarie Hubert, MD  Patient Care Team: Estill Dooms, MD as PCP - General (Internal Medicine) [facility] Gaynelle Arabian, MD as Consulting Physician (Orthopedic Surgery) Kashawna Manzer Otho Darner, NP as Nurse Practitioner (Internal Medicine)  Extended Emergency Contact Information Primary Emergency Contact: Peri [place] Mobile Phone: [phone] Relation: Daughter Secondary Emergency Contact: [facility] Phone: [phone] Relation: Other  Code Status:  DNR Goals of care: Advanced Directive information Advanced Directives 11/02/2015  Does patient have an advance directive? Yes  Type of Paramedic of [place];Living will;Out of facility DNR (pink MOST or yellow form)  Does patient want to make changes to advanced directive? No - Patient declined  Copy of advanced directive(s) in chart? Yes     Chief Complaint  Patient presents with  . Medical Management of Chronic Issues    HPI:  Pt is a 82 y.o. female seen today for medical management of chronic diseases.    Hx of hallucination, insomnia, anxiety, stable while on Risperdal 0.25mg  bid, Celexa 10mg , HTN is controlled, taking Losartan, Metoprolol, and Amlodipine. She is functioning adequately in AL setting.     Past Medical History:  Diagnosis Date  . Abnormality of gait 03/15/2010  . Anxiety state, unspecified 03/15/2010  . Depression, major 06/15/2015  . Depressive disorder, not elsewhere classified 03/15/2010  . Dysphagia, unspecified(787.20) 03/15/2010  . Generalized anxiety disorder 06/23/2014  . Herpes zoster without mention of complication [id number]  . Other alteration of consciousness 03/15/2010  . Pain in hand 08/13/12  . Personal history of fall 03/15/2010  . Senile osteoporosis 03/15/2010  . Unspecified constipation 03/15/2010  . Unspecified essential  hypertension 03/15/2010  . Unspecified hearing loss 08/22/2011  . Unspecified urinary incontinence 03/15/2010  . Urinary incontinence 03/15/2010  . Vaginal prolapse 06/15/2015   Past Surgical History:  Procedure Laterality Date  . INCISION / DRAINAGE HAND / FINGER Right 06/2008   hand  . SPINE SURGERY  12/2006    Allergies  Allergen Reactions  . Ace Inhibitors Other (See Comments)    Reaction unknown  . Hydrochlorothiazide Other (See Comments)    Reaction unknown  . Morphine And Related Other (See Comments)    Hallucinations      Medication List       Accurate as of 11/02/15  6:15 PM. Always use your most recent med list.          amLODipine 10 MG tablet Commonly known as:  NORVASC TAKE (1) TABLET DAILY FOR HIGH BLOOD PRESSURE.   aspirin EC 81 MG tablet Take 1 tablet (81 mg total) by mouth daily.   CALCIUM 600+D 600-200 MG-UNIT Tabs Generic drug:  Calcium Carbonate-Vitamin D Take 1 tablet by mouth daily.   citalopram 10 MG tablet Commonly known as:  CELEXA One daily to help depression and anxiety   furosemide 20 MG tablet Commonly known as:  LASIX Take 20 mg by mouth daily.   ibuprofen 200 MG tablet Commonly known as:  ADVIL,MOTRIN Take 400 mg by mouth every 6 (six) hours as needed (For headache, pain or fever.). Take one as needed   losartan 100 MG tablet Commonly known as:  COZAAR TAKE (1) TABLET DAILY FOR HIGH BLOOD PRESSURE.   metoprolol succinate 25 MG 24 hr tablet Commonly known as:  TOPROL-XL TAKE 1 TABLET ONCE DAILY TO CONTROL BLOOD PRESSURE.   polyethylene glycol  packet Commonly known as:  MIRALAX / GLYCOLAX Take 17 g by mouth daily.   PREPARATION H 0.25-14-74.9 % rectal ointment Generic drug:  phenylephrine-shark liver oil-mineral oil-petrolatum Place 1 application rectally 2 (two) times daily as needed for hemorrhoids.   PROCTO-PAK 1 % Crea Generic drug:  hydrocortisone Apply topically 2 (two) times daily as needed.   risperiDONE 0.5 MG  tablet Commonly known as:  RISPERDAL One at bedtime to prevent hallucinations and anxiety       Review of Systems  Constitutional: Negative for activity change, appetite change and fatigue.  HENT: Positive for hearing loss. Negative for ear pain.   Eyes: Negative.   Respiratory: Negative.  Negative for shortness of breath.   Cardiovascular: Positive for leg swelling. Negative for chest pain and palpitations.       HTN  Gastrointestinal: Negative.   Endocrine: Negative.   Genitourinary:       Incontinence  Musculoskeletal: Positive for arthralgias (in feet).       Pain in both hands: R>L.  Skin: Positive for rash (Erythematous rash of the lower legs nonspecific nature).  Neurological: Negative.   Hematological: Negative.   Psychiatric/Behavioral: Positive for confusion and sleep disturbance (Wakes at 3 AM. Full of energy then. Uses Motrin PM to get back to sleep. ). The patient is nervous/anxious.        Hx of hallucinations.    Immunization History  Administered Date(s) Administered  . Influenza Whole 10/24/2011, 10/24/2012  . Influenza-Unspecified 11/06/2013  . PPD Test 01/11/2015  . Pneumococcal Polysaccharide-23 01/23/2009  . Td 01/23/2009   Pertinent  Health Maintenance Due  Topic Date Due  . DEXA SCAN  07/26/1984  . PNA vac Low Risk Adult (2 of 2 - PCV13) 01/23/2010  . INFLUENZA VACCINE  08/24/2015   Fall Risk  09/21/2015 06/15/2015 01/05/2015 12/29/2014 12/29/2014  Falls in the past year? No No No No No  Number falls in past yr: - - - - -  Injury with Fall? - - - - -   Functional Status Survey:    Vitals:   11/02/15 1356  BP: 136/64  Pulse: (!) 55  Resp: 18  Temp: 98 F (36.7 C)  Weight: 125 lb 6.4 oz (56.9 kg)  Height: 5\' 2"  (1.575 m)   Body mass index is 22.94 kg/m. Physical Exam  Constitutional: She appears well-developed and well-nourished. No distress.  HENT:  Head: Normocephalic and atraumatic.  Severe hearing loss bilaterally. Wearing hearing  aids.  Eyes: Conjunctivae and EOM are normal. Pupils are equal, round, and reactive to light.  Neck: Normal range of motion. Neck supple. No JVD present. No tracheal deviation present. No thyromegaly present.  Cardiovascular: Normal rate, regular rhythm and normal heart sounds.  Exam reveals no gallop and no friction rub.   No murmur heard. Pulmonary/Chest: No respiratory distress. She has no wheezes. She has no rales. She exhibits no tenderness.  Abdominal: Bowel sounds are normal. She exhibits no distension and no mass. There is no tenderness.  Genitourinary: Rectal exam shows guaiac negative stool.  Genitourinary Comments: Examined 06/15/15: Firm area at the top of the vagina. Prolapse of the vaginal walls. Loss of rectal sphincter tone.  Musculoskeletal: Normal range of motion. She exhibits edema. She exhibits no tenderness.  Pain in hands. Right 3rd trigger finger. Uses walker due to unstable gait. Left 2nd toe is deviated to the great toe and has elevated DIP joint that rubs on her shoe. 2+ edema R+L ankles  Lymphadenopathy:  She has no cervical adenopathy.  Neurological: She is alert. No cranial nerve deficit. Coordination normal.  12/29/14 MMSE 26/30 . Failed clock drawing.  Skin: Rash (Nonspecific erythematous rash in both lower legs.) noted. No erythema. No pallor.  Psychiatric: She has a normal mood and affect. Her behavior is normal.    Labs reviewed:  Recent Labs  12/07/14 1759 12/10/14 1546 01/07/15 09/09/15 10/14/15  NA 136 138 138 139 139  K 3.6 3.2* 4.0 3.4 4.4  CL 97* 101  --   --   --   CO2 25 25  --   --   --   GLUCOSE 118* 92  --   --   --   BUN 21 17 17  23* 25*  CREATININE 0.69 0.80 0.6 0.8 1.0  CALCIUM 9.1 9.2  --   --   --     Recent Labs  12/07/14 1759 12/10/14 1546  AST 21 24  ALT 14 19  ALKPHOS 77 76  BILITOT 0.6 0.8  PROT 7.1 7.9  ALBUMIN 4.4 5.0    Recent Labs  12/07/14 1707 12/10/14 1546  WBC 9.1 8.4  NEUTROABS  --  4.9  HGB 13.3  14.2  HCT 38.5 41.3  MCV 97.9* 99.8  PLT  --  438*    Lab Results  Component Value Date   HGBA1C 5.6 12/11/2014   Lab Results  Component Value Date   CHOL 178 12/11/2014   HDL 60 12/11/2014   LDLCALC 97 12/11/2014   TRIG 106 12/11/2014   CHOLHDL 3.0 12/11/2014    Significant Diagnostic Results in last 30 days:  No results found.  Assessment/Plan Essential hypertension Blood pressure is controlled, continue Amlodipine 10mg  daily, Losartan 100mg  daily, Metoprolol 25mg  daily. Furosemide 20mg  daily. 08/19/15 Hgb 12.4, Na 139, K 4.2, Bun 11, creat 0.41, TSH 4.15, 09/09/15 Na 139, K 3.4, Bun 23, creat 0.82 10/14/15 Na 139, K 4.4, Bun 25, creat 0.97   Constipation Stable, 09/07/15 dc Colace, start MiraLax daily prn.     Hallucinations Stable, continue Risperdal 0.5mg  bid. Observe for negative AR of Risperdal.   Edema Improved since Furosemide 20mg  daily. 10/14/15 Na 139, K 4.4, Bun 25, creat 0.97  Depression, major Her mood is stable, continue Risperdal 0.5mg  bid, Celexa 10mg     Insomnia Sleeps better, continue Risperdal 0.5mg  bid  Generalized anxiety disorder Her mood is stable, continue Risperdal 0.5mg  bid, Celexa 10mg   Hypokalemia 10/14/15 Na 139, K 4.4, Bun 25, creat 0.97     Family/ staff Communication: continue AL for care assistance.   Labs/tests ordered: none

## 2015-11-02 NOTE — Assessment & Plan Note (Signed)
10/14/15 Na 139, K 4.4, Bun 25, creat 0.97

## 2015-11-02 NOTE — Assessment & Plan Note (Signed)
Stable, continue Risperdal 0.5mg  bid. Observe for negative AR of Risperdal.

## 2015-11-02 NOTE — Assessment & Plan Note (Signed)
Sleeps better, continue Risperdal 0.5mg  bid

## 2015-11-02 NOTE — Assessment & Plan Note (Signed)
Her mood is stable, continue Risperdal 0.5mg  bid, Celexa 10mg 

## 2015-11-02 NOTE — Assessment & Plan Note (Signed)
Stable, 09/07/15 dc Colace, start MiraLax daily prn.

## 2015-11-02 NOTE — Assessment & Plan Note (Signed)
Blood pressure is controlled, continue Amlodipine 10mg  daily, Losartan 100mg  daily, Metoprolol 25mg  daily. Furosemide 20mg  daily. 08/19/15 Hgb 12.4, Na 139, K 4.2, Bun 11, creat 0.41, TSH 4.15, 09/09/15 Na 139, K 3.4, Bun 23, creat 0.82 10/14/15 Na 139, K 4.4, Bun 25, creat 0.97

## 2015-11-02 NOTE — Assessment & Plan Note (Signed)
Improved since Furosemide 20mg  daily. 10/14/15 Na 139, K 4.4, Bun 25, creat 0.97

## 2015-11-10 ENCOUNTER — Encounter (HOSPITAL_COMMUNITY): Payer: Self-pay | Admitting: Emergency Medicine

## 2015-11-10 ENCOUNTER — Emergency Department (HOSPITAL_COMMUNITY): Payer: Medicare Other

## 2015-11-10 ENCOUNTER — Emergency Department (HOSPITAL_COMMUNITY)
Admission: EM | Admit: 2015-11-10 | Discharge: 2015-11-10 | Disposition: A | Discharge status: Home / Self Care | Payer: Medicare Other | Attending: Emergency Medicine | Admitting: Emergency Medicine

## 2015-11-10 DIAGNOSIS — S0990XA Unspecified injury of head, initial encounter: Secondary | ICD-10-CM | POA: Diagnosis not present

## 2015-11-10 DIAGNOSIS — S199XXA Unspecified injury of neck, initial encounter: Secondary | ICD-10-CM | POA: Diagnosis not present

## 2015-11-10 DIAGNOSIS — S0181XA Laceration without foreign body of other part of head, initial encounter: Secondary | ICD-10-CM | POA: Diagnosis not present

## 2015-11-10 DIAGNOSIS — Y999 Unspecified external cause status: Secondary | ICD-10-CM | POA: Diagnosis not present

## 2015-11-10 DIAGNOSIS — I4892 Unspecified atrial flutter: Secondary | ICD-10-CM | POA: Diagnosis not present

## 2015-11-10 DIAGNOSIS — S098XXA Other specified injuries of head, initial encounter: Secondary | ICD-10-CM | POA: Diagnosis not present

## 2015-11-10 DIAGNOSIS — I10 Essential (primary) hypertension: Secondary | ICD-10-CM | POA: Diagnosis not present

## 2015-11-10 DIAGNOSIS — Y9301 Activity, walking, marching and hiking: Secondary | ICD-10-CM | POA: Insufficient documentation

## 2015-11-10 DIAGNOSIS — S01111A Laceration without foreign body of right eyelid and periocular area, initial encounter: Secondary | ICD-10-CM | POA: Diagnosis not present

## 2015-11-10 DIAGNOSIS — Z7982 Long term (current) use of aspirin: Secondary | ICD-10-CM | POA: Insufficient documentation

## 2015-11-10 DIAGNOSIS — Z79899 Other long term (current) drug therapy: Secondary | ICD-10-CM | POA: Diagnosis not present

## 2015-11-10 DIAGNOSIS — W19XXXA Unspecified fall, initial encounter: Secondary | ICD-10-CM | POA: Diagnosis not present

## 2015-11-10 DIAGNOSIS — Y92129 Unspecified place in nursing home as the place of occurrence of the external cause: Secondary | ICD-10-CM | POA: Insufficient documentation

## 2015-11-10 DIAGNOSIS — M542 Cervicalgia: Secondary | ICD-10-CM | POA: Diagnosis not present

## 2015-11-10 DIAGNOSIS — S0091XA Abrasion of unspecified part of head, initial encounter: Secondary | ICD-10-CM | POA: Diagnosis not present

## 2015-11-10 LAB — CBC WITH DIFFERENTIAL/PLATELET
Basophils Absolute: 0 10*3/uL (ref 0.0–0.1)
Basophils Relative: 0 %
Eosinophils Absolute: 0.1 10*3/uL (ref 0.0–0.7)
Eosinophils Relative: 2 %
HCT: 38.5 % (ref 36.0–46.0)
Hemoglobin: 13.1 g/dL (ref 12.0–15.0)
Lymphocytes Relative: 18 %
Lymphs Abs: 1.5 10*3/uL (ref 0.7–4.0)
MCH: 33.4 pg (ref 26.0–34.0)
MCHC: 34 g/dL (ref 30.0–36.0)
MCV: 98.2 fL (ref 78.0–100.0)
Monocytes Absolute: 0.7 10*3/uL (ref 0.1–1.0)
Monocytes Relative: 9 %
Neutro Abs: 5.8 10*3/uL (ref 1.7–7.7)
Neutrophils Relative %: 71 %
Platelets: 343 10*3/uL (ref 150–400)
RBC: 3.92 MIL/uL (ref 3.87–5.11)
RDW: 13.5 % (ref 11.5–15.5)
WBC: 8.1 10*3/uL (ref 4.0–10.5)

## 2015-11-10 LAB — BASIC METABOLIC PANEL
Anion gap: 8 (ref 5–15)
BUN: 20 mg/dL (ref 6–20)
CO2: 30 mmol/L (ref 22–32)
Calcium: 8.8 mg/dL — ABNORMAL LOW (ref 8.9–10.3)
Chloride: 100 mmol/L — ABNORMAL LOW (ref 101–111)
Creatinine, Ser: 0.7 mg/dL (ref 0.44–1.00)
GFR calc Af Amer: >60 mL/min (ref >60)
GFR calc non Af Amer: >60 mL/min (ref >60)
Glucose, Bld: 105 mg/dL — ABNORMAL HIGH (ref 65–99)
Potassium: 4 mmol/L (ref 3.5–5.1)
Sodium: 138 mmol/L (ref 135–145)

## 2015-11-10 LAB — TROPONIN I: Troponin I: <0.03 ng/mL (ref <0.03)

## 2015-11-10 MED ORDER — LIDOCAINE-EPINEPHRINE (PF) 2 %-1:200000 IJ SOLN: 20.0000 mL | Freq: Once | INTRAMUSCULAR | Status: DC

## 2015-11-10 MED ORDER — LIDOCAINE-EPINEPHRINE (PF) 2 %-1:200000 IJ SOLN
10.0000 mL | Freq: Once | INTRAMUSCULAR | Status: AC
  Administered 2015-11-10: 10 mL
  Filled 2015-11-10: qty 10

## 2015-11-10 NOTE — ED Triage Notes (Signed)
Patient here from [facility] with complaints of fall today. Laceration to right eye. Bleeding controlled. No blood thinners. Hx dementia. Walks with walker.

## 2015-11-10 NOTE — Discharge Instructions (Signed)
Treatment: Keep your wound dry and dressing applied until this time tomorrow. After 24 hours, you may wash with warm soapy water. Dry and apply antibiotic ointment and clean dressing. Do this daily until your sutures are removed. ° °Follow-up: Please follow-up with your primary care provider or return to emergency department in 7-10 days for suture removal. Be aware of signs of infection: fever, increasing pain, redness, swelling, drainage from the area. Please call your primary care provider or return to emergency department if you develop any of these symptoms or if any of the sutures come out prior to removal. Please return to the emergency department if you develop any other new or worsening symptoms. ° °

## 2015-11-10 NOTE — ED Notes (Signed)
Bed: [facility] Expected date:  Expected time:  Means of arrival:  Comments: EMS-fall 

## 2015-11-10 NOTE — ED Notes (Signed)
Unable to get EKG at this time(no available rooms for privacy)

## 2015-11-10 NOTE — ED Provider Notes (Addendum)
[facility] DEPT Provider Note   CSN: [id number] Arrival date & time: 11/10/15  1302     History   Chief Complaint Chief Complaint  Patient presents with  . Facial Laceration    HPI [name] is a 82 y.o. female who presents following a fall with laceration to above right eyebrow.She reports that she was feeling well prior to falling, however she does not recall how she fell. I spoke with her nursing facility, [facility], who was not able to give much more information. The following was not witnessed. It was reported that the patient ambulated to the hallway with the bleeding had after she had fallen. She cannot recall to nursing staff how she felt either. Per nursing home staff, patient is alert and oriented at baseline. Patient denies any pain elsewhere. Patient denies any headache except over the area of laceration. Tetanus UTD. Patient has short term memory problems at baseline.  HPI  Past Medical History:  Diagnosis Date  . Abnormality of gait 03/15/2010  . Anxiety state, unspecified 03/15/2010  . Depression, major 06/15/2015  . Depressive disorder, not elsewhere classified 03/15/2010  . Dysphagia, unspecified(787.20) 03/15/2010  . Generalized anxiety disorder 06/23/2014  . Herpes zoster without mention of complication [id number]  . Other alteration of consciousness 03/15/2010  . Pain in hand 08/13/12  . Personal history of fall 03/15/2010  . Senile osteoporosis 03/15/2010  . Unspecified constipation 03/15/2010  . Unspecified essential hypertension 03/15/2010  . Unspecified hearing loss 08/22/2011  . Unspecified urinary incontinence 03/15/2010  . Urinary incontinence 03/15/2010  . Vaginal prolapse 06/15/2015    Patient Active Problem List   Diagnosis Date Noted  . Rash 09/21/2015  . Urinary tract infection, site not specified 09/14/2015  . Constipation 06/15/2015  . Urinary frequency 06/15/2015  . Depression, major 06/15/2015  . Vaginal prolapse 06/15/2015  .  Edema 04/21/2015  . Hallucinations 12/22/2014  . Speech abnormality   . TIA (transient ischemic attack) 12/10/2014  . Hypokalemia 12/10/2014  . Generalized anxiety disorder 06/23/2014  . Hearing loss 06/03/2014  . Insomnia 12/16/2013  . Trigger finger, acquired 03/04/2013  . Pain in hand   . Essential hypertension 03/15/2010  . Urinary incontinence 03/15/2010  . Abnormality of gait 03/15/2010    Past Surgical History:  Procedure Laterality Date  . INCISION / DRAINAGE HAND / FINGER Right 06/2008   hand  . SPINE SURGERY  12/2006    OB History    No data available       Home Medications    Prior to Admission medications   Medication Sig Start Date End Date Taking? Authorizing Provider  amLODipine (NORVASC) 10 MG tablet TAKE (1) TABLET DAILY FOR HIGH BLOOD PRESSURE. 12/02/14  Yes Estill Dooms, MD  aspirin EC 81 MG tablet Take 1 tablet (81 mg total) by mouth daily. 12/11/14  Yes Theodis Blaze, MD  Calcium Carbonate-Vitamin D (CALCIUM 600+D) 600-200 MG-UNIT TABS Take 1 tablet by mouth daily.    Yes Historical Provider, MD  citalopram (CELEXA) 10 MG tablet One daily to help depression and anxiety 06/15/15  Yes Estill Dooms, MD  furosemide (LASIX) 20 MG tablet Take 20 mg by mouth daily.   Yes Historical Provider, MD  ibuprofen (ADVIL,MOTRIN) 200 MG tablet Take 400 mg by mouth every 6 (six) hours as needed (For headache, pain or fever.). Take one as needed   Yes Historical Provider, MD  losartan (COZAAR) 100 MG tablet TAKE (1) TABLET DAILY FOR HIGH BLOOD PRESSURE.  12/02/14  Yes Estill Dooms, MD  metoprolol succinate (TOPROL-XL) 25 MG 24 hr tablet TAKE 1 TABLET ONCE DAILY TO CONTROL BLOOD PRESSURE. 12/02/14  Yes Estill Dooms, MD  polyethylene glycol [facility] / GLYCOLAX) packet Take 17 g by mouth daily as needed for moderate constipation.    Yes Historical Provider, MD  risperiDONE (RISPERDAL) 0.5 MG tablet One at bedtime to prevent hallucinations and anxiety 06/22/15  Yes Estill Dooms, MD    Family History Family History  Problem Relation Age of Onset  . Heart disease Father     MI    Social History Social History  Substance Use Topics  . Smoking status: Never Smoker  . Smokeless tobacco: Never Used  . Alcohol use Yes     Comment: 2-4 oz of wine once daily at request     Allergies   Ace inhibitors; Hydrochlorothiazide; and Morphine and related   Review of Systems Review of Systems   Physical Exam Updated Vital Signs BP 144/62 (BP Location: Right Arm)   Pulse 62   Temp 98.6 F (37 C) (Oral)   Resp 16   SpO2 95%   Physical Exam  Constitutional: She is oriented to person, place, and time. She appears well-developed and well-nourished. No distress.  HENT:  Head: Normocephalic. Head is with laceration (3cm above right eyebrow).  Right Ear: External ear normal.  Left Ear: External ear normal.  Nose: Nose normal.  Eyes: Conjunctivae and EOM are normal. Pupils are equal, round, and reactive to light. Right eye exhibits no discharge. Left eye exhibits no discharge. No scleral icterus.  Neck: Normal range of motion. Neck supple.  Cardiovascular: Normal rate, regular rhythm and normal heart sounds.  Exam reveals no gallop and no friction rub.   No murmur heard. Pulses:      Radial pulses are 2+ on the right side, and 2+ on the left side.       Dorsalis pedis pulses are 2+ on the right side, and 2+ on the left side.  Pulmonary/Chest: Effort normal and breath sounds normal. No stridor. No respiratory distress. She has no wheezes.  Abdominal: Soft. She exhibits no distension. There is no tenderness.  Musculoskeletal: She exhibits no edema or tenderness.       Cervical back: She exhibits no bony tenderness.       Thoracic back: She exhibits no bony tenderness.       Lumbar back: She exhibits no bony tenderness.  Clavicles stable. Chest stable to AP/Lat compression. Pelvis stable to Lat compression. No obvious extremity deformity.     Neurological: She is alert and oriented to person, place, and time.  Moving all extremities  Skin: Skin is warm and dry. No rash noted. She is not diaphoretic. No erythema.  Psychiatric: She has a normal mood and affect.     ED Treatments / Results  Labs (all labs ordered are listed, but only abnormal results are displayed) Labs Reviewed  BASIC METABOLIC PANEL - Abnormal; Notable for the following:       Result Value   Chloride 100 (*)    Glucose, Bld 105 (*)    Calcium 8.8 (*)    All other components within normal limits  CBC WITH DIFFERENTIAL/PLATELET  TROPONIN I    EKG  EKG Interpretation None       Radiology Ct Head Wo Contrast  Result Date: 11/10/2015 CLINICAL DATA:  Fall, right eye/forehead laceration EXAM: CT HEAD WITHOUT CONTRAST TECHNIQUE: Contiguous axial images were  obtained from the base of the skull through the vertex without intravenous contrast. COMPARISON:  MRI brain dated 12/10/2014 FINDINGS: Brain: No evidence of acute infarction, hemorrhage, hydrocephalus, extra-axial collection or mass lesion/mass effect. Vascular: No hyperdense vessel or unexpected calcification. Skull: Normal. Negative for fracture or focal lesion. Sinuses/Orbits: No acute finding. Other: Global cortical atrophy.  Secondary ventriculomegaly. Subcortical white matter and periventricular small vessel ischemic changes. Intracranial atherosclerosis. IMPRESSION: No evidence of acute intracranial abnormality. Atrophy with small vessel ischemic changes. Electronically Signed   By: Julian Hy M.D.   On: 11/10/2015 14:46   Ct Cervical Spine Wo Contrast  Result Date: 11/10/2015 CLINICAL DATA:  Patient fell with laceration to right eye. Neck pain. EXAM: CT CERVICAL SPINE WITHOUT CONTRAST TECHNIQUE: Multidetector CT imaging of the cervical spine was performed without intravenous contrast. Multiplanar CT image reconstructions were also generated. COMPARISON:  None. FINDINGS: Alignment: 2 mm of  retrolisthesis of C3 on C4 and C4 on C5. Dextroconvex curvature of the cervical spine with apex at C4-5. Skull base and vertebrae: No acute fracture bone destruction. Craniocervical relationship is maintained. The occipital condyles articulate with C1. Odontoid process appears intact. Calcification of the transverse ligament. Osteoarthritis of the atlantodental interval. Soft tissues and spinal canal: No paraspinal hematoma or soft tissue swelling. No significant central canal stenosis. Disc levels: There is degenerative disc space narrowing most marked from C4 through C7. Lesser degree of disc space narrowing, mild to moderate at C3-4. Upper chest: Atherosclerosis of the visualized aortic arch. Scarring at the right lung apex with associated coarse calcifications. 8 mm bilateral thyroid nodules without worrisome features nor of size for additional workup are incidentally noted. Other: Atherosclerotic calcifications of both vertebral arteries. IMPRESSION: No acute cervical spine abnormality. Cervical spondylosis with dextroscoliosis. Electronically Signed   By: Ashley Royalty M.D.   On: 11/10/2015 17:14    Procedures Procedures (including critical care time)  LACERATION REPAIR Performed by: Frederica Kuster Authorized by: Frederica Kuster Consent: Verbal consent obtained. Risks and benefits: risks, benefits and alternatives were discussed Consent given by: patient Patient identity confirmed: provided demographic data Prepped and Draped in normal sterile fashion Wound explored  Laceration Location: Above right eyebrow  Laceration Length: 3 cm  No Foreign Bodies seen or palpated  Anesthesia: local infiltration  Local anesthetic: lidocaine 2% w/ epinephrine  Anesthetic total: 3 ml  Irrigation method: syringe Amount of cleaning: standard  Skin closure: 6-0 Prolene  Number of sutures: 6  Technique: simple interrupted  Patient tolerance: Patient tolerated the procedure well with no  immediate complications.   Medications Ordered in ED Medications  lidocaine-EPINEPHrine (XYLOCAINE W/EPI) 2 %-1:200000 (PF) injection 10 mL (10 mLs Infiltration Given 11/10/15 1516)     Initial Impression / Assessment and Plan / ED Course  I have reviewed the triage vital signs and the nursing notes.  Pertinent labs & imaging results that were available during my care of the patient were reviewed by me and considered in my medical decision making (see chart for details).  Clinical Course    CT head and C-spine negative. CBC, troponin negative. BMP shows chloride 100, glucose 105, calcium 8.8. EKG shows no significant change from last tracing. No obvious sign to cause fall, however patient with short-term memory loss at baseline and does not know if the car was mechanical and on. Tetanus UTD. Laceration occurred < 12 hours prior to repair. Discussed laceration care with pt and answered questions. Pt to f-u for suture removal in 7 days and  wound check sooner should there be signs of dehiscence or infection.  Patient also evaluated by Dr. Leonette Monarch who guided the patient's management and agrees with plan. Patient vitals stable throughout ED course and discharged in satisfactory condition.   Final Clinical Impressions(s) / ED Diagnoses   Final diagnoses:  Laceration of forehead, initial encounter    New Prescriptions New Prescriptions   No medications on file     Frederica Kuster, Hershal Coria 11/10/15 Kevin, MD 11/11/15 [place], MD 11/26/15 [phone]

## 2015-11-13 DIAGNOSIS — N390 Urinary tract infection, site not specified: Secondary | ICD-10-CM | POA: Diagnosis not present

## 2015-11-16 ENCOUNTER — Encounter: Payer: Self-pay | Admitting: Nurse Practitioner

## 2015-12-03 ENCOUNTER — Encounter: Payer: Self-pay | Admitting: Nurse Practitioner

## 2015-12-03 ENCOUNTER — Non-Acute Institutional Stay: Payer: Medicare Other | Admitting: Nurse Practitioner

## 2015-12-03 DIAGNOSIS — K5900 Constipation, unspecified: Secondary | ICD-10-CM

## 2015-12-03 DIAGNOSIS — R443 Hallucinations, unspecified: Secondary | ICD-10-CM | POA: Diagnosis not present

## 2015-12-03 DIAGNOSIS — R609 Edema, unspecified: Secondary | ICD-10-CM

## 2015-12-03 DIAGNOSIS — F320 Major depressive disorder, single episode, mild: Secondary | ICD-10-CM

## 2015-12-03 DIAGNOSIS — I10 Essential (primary) hypertension: Secondary | ICD-10-CM

## 2015-12-03 NOTE — Assessment & Plan Note (Signed)
Stable, continue Risperdal 0.5mg  hs. Observe for negative AR of Risperdal.

## 2015-12-03 NOTE — Assessment & Plan Note (Signed)
Blood pressure is controlled, continue Amlodipine 10mg  daily, Losartan 100mg  daily, Metoprolol 25mg  daily. Furosemide 20mg  daily.

## 2015-12-03 NOTE — Progress Notes (Signed)
Location:  [facility] Room Number: 8 Place of Service:  ALF [phone]) Provider:  Damani Rando, [place]  NP  Jeanmarie Hubert, MD  Patient Care Team: Estill Dooms, MD as PCP - General (Internal Medicine) [facility] Gaynelle Arabian, MD as Consulting Physician (Orthopedic Surgery) Tallia Moehring Otho Darner, NP as Nurse Practitioner (Internal Medicine)  Extended Emergency Contact Information Primary Emergency Contact: Peri [place] Mobile Phone: [phone] Relation: Daughter Secondary Emergency Contact: [facility] Phone: [phone] Relation: Other  Code Status:  DNR Goals of care: Advanced Directive information Advanced Directives 12/03/2015  Does patient have an advance directive? Yes  Type of Paramedic of [place];Living will;Out of facility DNR (pink MOST or yellow form)  Does patient want to make changes to advanced directive? No - Patient declined  Copy of advanced directive(s) in chart? Yes     Chief Complaint  Patient presents with  . Medical Management of Chronic Issues    HPI:  Pt is a 82 y.o. female seen today for medical management of chronic diseases.    ED eval 11/10/15 sustained forehead laceration from a mechanical fall, healed, CT head, cervical spine, no acute abnormalities.   Hx of hallucination, insomnia, anxiety, stable while on Risperdal 0.5mg  qd, Celexa 10mg , HTN is controlled, taking Losartan, Metoprolol, and Amlodipine. Chronic edema BLE is managed with Furosemide 20mg . She is functioning adequately in AL setting.    Past Medical History:  Diagnosis Date  . Abnormality of gait 03/15/2010  . Anxiety state, unspecified 03/15/2010  . Depression, major 06/15/2015  . Depressive disorder, not elsewhere classified 03/15/2010  . Dysphagia, unspecified(787.20) 03/15/2010  . Generalized anxiety disorder 06/23/2014  . Herpes zoster without mention of complication [id number]  . Other alteration of  consciousness 03/15/2010  . Pain in hand 08/13/12  . Personal history of fall 03/15/2010  . Senile osteoporosis 03/15/2010  . Unspecified constipation 03/15/2010  . Unspecified essential hypertension 03/15/2010  . Unspecified hearing loss 08/22/2011  . Unspecified urinary incontinence 03/15/2010  . Urinary incontinence 03/15/2010  . Vaginal prolapse 06/15/2015   Past Surgical History:  Procedure Laterality Date  . INCISION / DRAINAGE HAND / FINGER Right 06/2008   hand  . SPINE SURGERY  12/2006    Allergies  Allergen Reactions  . Ace Inhibitors Other (See Comments)    Reaction unknown  . Hydrochlorothiazide Other (See Comments)    Reaction unknown  . Morphine And Related Other (See Comments)    Hallucinations      Medication List       Accurate as of 12/03/15  2:25 PM. Always use your most recent med list.          amLODipine 10 MG tablet Commonly known as:  NORVASC TAKE (1) TABLET DAILY FOR HIGH BLOOD PRESSURE.   aspirin EC 81 MG tablet Take 1 tablet (81 mg total) by mouth daily.   CALCIUM 600+D 600-200 MG-UNIT Tabs Generic drug:  Calcium Carbonate-Vitamin D Take 1 tablet by mouth daily.   citalopram 10 MG tablet Commonly known as:  CELEXA One daily to help depression and anxiety   furosemide 20 MG tablet Commonly known as:  LASIX Take 20 mg by mouth daily.   ibuprofen 200 MG tablet Commonly known as:  ADVIL,MOTRIN Take 400 mg by mouth every 6 (six) hours as needed (For headache, pain or fever.). Take one as needed   losartan 100 MG tablet Commonly known as:  COZAAR TAKE (1) TABLET DAILY FOR HIGH BLOOD  PRESSURE.   metoprolol succinate 25 MG 24 hr tablet Commonly known as:  TOPROL-XL TAKE 1 TABLET ONCE DAILY TO CONTROL BLOOD PRESSURE.   polyethylene glycol packet Commonly known as:  MIRALAX / GLYCOLAX Take 17 g by mouth daily as needed for moderate constipation.   risperiDONE 0.5 MG tablet Commonly known as:  RISPERDAL One at bedtime to prevent  hallucinations and anxiety       Review of Systems  Constitutional: Negative for activity change, appetite change and fatigue.  HENT: Positive for hearing loss. Negative for ear pain.   Eyes: Negative.   Respiratory: Negative.  Negative for shortness of breath.   Cardiovascular: Positive for leg swelling. Negative for chest pain and palpitations.       HTN  Gastrointestinal: Negative.   Endocrine: Negative.   Genitourinary:       Incontinence  Musculoskeletal: Positive for arthralgias (in feet).       Pain in both hands: R>L.  Skin: Positive for rash (Erythematous rash of the lower legs nonspecific nature).  Neurological: Negative.   Hematological: Negative.   Psychiatric/Behavioral: Positive for confusion and sleep disturbance (Wakes at 3 AM. Full of energy then. Uses Motrin PM to get back to sleep. ). The patient is nervous/anxious.        Hx of hallucinations.    Immunization History  Administered Date(s) Administered  . Influenza Whole 10/24/2011, 10/24/2012  . Influenza-Unspecified 11/06/2013, 11/04/2015  . PPD Test 01/11/2015  . Pneumococcal Polysaccharide-23 01/23/2009  . Td 01/23/2009   Pertinent  Health Maintenance Due  Topic Date Due  . DEXA SCAN  07/26/1984  . PNA vac Low Risk Adult (2 of 2 - PCV13) 01/23/2010  . INFLUENZA VACCINE  Completed   Fall Risk  09/21/2015 06/15/2015 01/05/2015 12/29/2014 12/29/2014  Falls in the past year? No No No No No  Number falls in past yr: - - - - -  Injury with Fall? - - - - -   Functional Status Survey:    Vitals:   12/03/15 1007  BP: 128/89  Pulse: 65  Resp: 20  Temp: 97.6 F (36.4 C)  Weight: 124 lb (56.2 kg)  Height: 5\' 2"  (1.575 m)   Body mass index is 22.68 kg/m. Physical Exam  Constitutional: She appears well-developed and well-nourished. No distress.  HENT:  Head: Normocephalic and atraumatic.  Severe hearing loss bilaterally. Wearing hearing aids.  Eyes: Conjunctivae and EOM are normal. Pupils are  equal, round, and reactive to light.  Neck: Normal range of motion. Neck supple. No JVD present. No tracheal deviation present. No thyromegaly present.  Cardiovascular: Normal rate, regular rhythm and normal heart sounds.  Exam reveals no gallop and no friction rub.   No murmur heard. Pulmonary/Chest: No respiratory distress. She has no wheezes. She has no rales. She exhibits no tenderness.  Abdominal: Bowel sounds are normal. She exhibits no distension and no mass. There is no tenderness.  Genitourinary: Rectal exam shows guaiac negative stool.  Genitourinary Comments: Examined 06/15/15: Firm area at the top of the vagina. Prolapse of the vaginal walls. Loss of rectal sphincter tone.  Musculoskeletal: Normal range of motion. She exhibits edema. She exhibits no tenderness.  Pain in hands. Right 3rd trigger finger. Uses walker due to unstable gait. Left 2nd toe is deviated to the great toe and has elevated DIP joint that rubs on her shoe. Trace edema R+L ankles  Lymphadenopathy:    She has no cervical adenopathy.  Neurological: She is alert. No cranial nerve  deficit. Coordination normal.  12/29/14 MMSE 26/30 . Failed clock drawing.  Skin: Rash (Nonspecific erythematous rash in both lower legs.) noted. No erythema. No pallor.  Psychiatric: She has a normal mood and affect. Her behavior is normal.    Labs reviewed:  Recent Labs  12/07/14 1759 12/10/14 1546  09/09/15 10/14/15 11/10/15 1452  NA 136 138  < > 139 139 138  K 3.6 3.2*  < > 3.4 4.4 4.0  CL 97* 101  --   --   --  100*  CO2 25 25  --   --   --  30  GLUCOSE 118* 92  --   --   --  105*  BUN 21 17  < > 23* 25* 20  CREATININE 0.69 0.80  < > 0.8 1.0 0.70  CALCIUM 9.1 9.2  --   --   --  8.8*  < > = values in this interval not displayed.  Recent Labs  12/07/14 1759 12/10/14 1546  AST 21 24  ALT 14 19  ALKPHOS 77 76  BILITOT 0.6 0.8  PROT 7.1 7.9  ALBUMIN 4.4 5.0    Recent Labs  12/07/14 1707 12/10/14 1546  11/10/15 1452  WBC 9.1 8.4 8.1  NEUTROABS  --  4.9 5.8  HGB 13.3 14.2 13.1  HCT 38.5 41.3 38.5  MCV 97.9* 99.8 98.2  PLT  --  438* 343    Lab Results  Component Value Date   HGBA1C 5.6 12/11/2014   Lab Results  Component Value Date   CHOL 178 12/11/2014   HDL 60 12/11/2014   LDLCALC 97 12/11/2014   TRIG 106 12/11/2014   CHOLHDL 3.0 12/11/2014    Significant Diagnostic Results in last 30 days:  Ct Head Wo Contrast  Result Date: 11/10/2015 CLINICAL DATA:  Fall, right eye/forehead laceration EXAM: CT HEAD WITHOUT CONTRAST TECHNIQUE: Contiguous axial images were obtained from the base of the skull through the vertex without intravenous contrast. COMPARISON:  MRI brain dated 12/10/2014 FINDINGS: Brain: No evidence of acute infarction, hemorrhage, hydrocephalus, extra-axial collection or mass lesion/mass effect. Vascular: No hyperdense vessel or unexpected calcification. Skull: Normal. Negative for fracture or focal lesion. Sinuses/Orbits: No acute finding. Other: Global cortical atrophy.  Secondary ventriculomegaly. Subcortical white matter and periventricular small vessel ischemic changes. Intracranial atherosclerosis. IMPRESSION: No evidence of acute intracranial abnormality. Atrophy with small vessel ischemic changes. Electronically Signed   By: Julian Hy M.D.   On: 11/10/2015 14:46   Ct Cervical Spine Wo Contrast  Result Date: 11/10/2015 CLINICAL DATA:  Patient fell with laceration to right eye. Neck pain. EXAM: CT CERVICAL SPINE WITHOUT CONTRAST TECHNIQUE: Multidetector CT imaging of the cervical spine was performed without intravenous contrast. Multiplanar CT image reconstructions were also generated. COMPARISON:  None. FINDINGS: Alignment: 2 mm of retrolisthesis of C3 on C4 and C4 on C5. Dextroconvex curvature of the cervical spine with apex at C4-5. Skull base and vertebrae: No acute fracture bone destruction. Craniocervical relationship is maintained. The occipital  condyles articulate with C1. Odontoid process appears intact. Calcification of the transverse ligament. Osteoarthritis of the atlantodental interval. Soft tissues and spinal canal: No paraspinal hematoma or soft tissue swelling. No significant central canal stenosis. Disc levels: There is degenerative disc space narrowing most marked from C4 through C7. Lesser degree of disc space narrowing, mild to moderate at C3-4. Upper chest: Atherosclerosis of the visualized aortic arch. Scarring at the right lung apex with associated coarse calcifications. 8 mm bilateral thyroid nodules without worrisome  features nor of size for additional workup are incidentally noted. Other: Atherosclerotic calcifications of both vertebral arteries. IMPRESSION: No acute cervical spine abnormality. Cervical spondylosis with dextroscoliosis. Electronically Signed   By: Ashley Royalty M.D.   On: 11/10/2015 17:14    Assessment/Plan Essential hypertension Blood pressure is controlled, continue Amlodipine 10mg  daily, Losartan 100mg  daily, Metoprolol 25mg  daily. Furosemide 20mg  daily.   Constipation Stable, continue MiraLax daily prn.   Hallucinations Stable, continue Risperdal 0.5mg  hs. Observe for negative AR of Risperdal.    Edema Improved since Furosemide 20mg  daily.   Depression, major Her mood is stable, continue Risperdal 0.5mg ,  Celexa 10mg         Family/ staff Communication:AL  Labs/tests ordered: none

## 2015-12-03 NOTE — Assessment & Plan Note (Signed)
Stable, continue MiraLax daily prn.

## 2015-12-03 NOTE — Assessment & Plan Note (Signed)
Her mood is stable, continue Risperdal 0.5mg,  Celexa 10mg.  

## 2015-12-03 NOTE — Assessment & Plan Note (Signed)
Improved since Furosemide 20mg  daily.

## 2015-12-10 ENCOUNTER — Encounter: Payer: Self-pay | Admitting: Nurse Practitioner

## 2015-12-10 ENCOUNTER — Non-Acute Institutional Stay: Payer: Medicare Other | Admitting: Nurse Practitioner

## 2015-12-10 DIAGNOSIS — F320 Major depressive disorder, single episode, mild: Secondary | ICD-10-CM | POA: Diagnosis not present

## 2015-12-10 DIAGNOSIS — R443 Hallucinations, unspecified: Secondary | ICD-10-CM

## 2015-12-10 DIAGNOSIS — N3942 Incontinence without sensory awareness: Secondary | ICD-10-CM | POA: Diagnosis not present

## 2015-12-10 DIAGNOSIS — N390 Urinary tract infection, site not specified: Secondary | ICD-10-CM | POA: Diagnosis not present

## 2015-12-10 DIAGNOSIS — K5900 Constipation, unspecified: Secondary | ICD-10-CM | POA: Diagnosis not present

## 2015-12-10 DIAGNOSIS — S8992XA Unspecified injury of left lower leg, initial encounter: Secondary | ICD-10-CM | POA: Diagnosis not present

## 2015-12-10 DIAGNOSIS — R609 Edema, unspecified: Secondary | ICD-10-CM

## 2015-12-10 DIAGNOSIS — I10 Essential (primary) hypertension: Secondary | ICD-10-CM

## 2015-12-10 DIAGNOSIS — R269 Unspecified abnormalities of gait and mobility: Secondary | ICD-10-CM | POA: Diagnosis not present

## 2015-12-10 NOTE — Assessment & Plan Note (Signed)
11/16/15 urine culture Strep, Ampicillin 500mg  q8h x 7 days.

## 2015-12-10 NOTE — Assessment & Plan Note (Signed)
persisted confusion, increased urinary incontinence, frequently falling, last treated UTI 10/241/7. She is afebrile, no new focal neurological symptoms. Obtain CBC CMP TSH UA C/S MMSE. Pt to eval and treat as indicated.

## 2015-12-10 NOTE — Assessment & Plan Note (Signed)
Her mood is stable, continue Risperdal 0.5mg,  Celexa 10mg.  

## 2015-12-10 NOTE — Assessment & Plan Note (Signed)
Blood pressure is controlled, continue Amlodipine 10mg  daily, Losartan 100mg  daily, Metoprolol 25mg  daily. Furosemide 20mg  daily.

## 2015-12-10 NOTE — Assessment & Plan Note (Signed)
Persists, repeat UA C/S

## 2015-12-10 NOTE — Assessment & Plan Note (Signed)
Stable, continue MiraLax daily prn.

## 2015-12-10 NOTE — Assessment & Plan Note (Signed)
Skin cut, mild erythema and warmth peri wound, apply Silvadene bid until healed.

## 2015-12-10 NOTE — Progress Notes (Signed)
Location:  [place] Room Number: 8 Place of Service:  ALF [phone]) Provider:  Mast, Manxie  NP  Jeanmarie Hubert, MD  Patient Care Team: Estill Dooms, MD as PCP - General (Internal Medicine) [facility] Gaynelle Arabian, MD as Consulting Physician (Orthopedic Surgery) Man Otho Darner, NP as Nurse Practitioner (Internal Medicine)  Extended Emergency Contact Information Primary Emergency Contact: Peri [place] Mobile Phone: [phone] Relation: Daughter Secondary Emergency Contact: [facility] Phone: [phone] Relation: Other  Code Status:  DNR Goals of care: Advanced Directive information Advanced Directives 12/10/2015  Does patient have an advance directive? Yes  Type of Paramedic of [place];Living will;Out of facility DNR (pink MOST or yellow form)  Does patient want to make changes to advanced directive? No - Patient declined  Copy of advanced directive(s) in chart? Yes     Chief Complaint  Patient presents with  . Acute Visit    increased confusion, incontinence, number of falls, changes in her condition    HPI:  Pt is a 82 y.o. female seen today for an acute visit for persisted confusion, increased urinary incontinence, frequently falling, last treated UTI 10/241/7. She is afebrile, no new focal neurological symptoms.    Hx of hallucination, insomnia, anxiety, stable while on Risperdal 0.5mg  qd, Celexa 10mg , HTN is controlled, taking Losartan, Metoprolol, and Amlodipine. Chronic edema BLE is managed with Furosemide 20mg .  Past Medical History:  Diagnosis Date  . Abnormality of gait 03/15/2010  . Anxiety state, unspecified 03/15/2010  . Depression, major 06/15/2015  . Depressive disorder, not elsewhere classified 03/15/2010  . Dysphagia, unspecified(787.20) 03/15/2010  . Generalized anxiety disorder 06/23/2014  . Herpes zoster without mention of complication [id number]  . Other alteration of  consciousness 03/15/2010  . Pain in hand 08/13/12  . Personal history of fall 03/15/2010  . Senile osteoporosis 03/15/2010  . Unspecified constipation 03/15/2010  . Unspecified essential hypertension 03/15/2010  . Unspecified hearing loss 08/22/2011  . Unspecified urinary incontinence 03/15/2010  . Urinary incontinence 03/15/2010  . Vaginal prolapse 06/15/2015   Past Surgical History:  Procedure Laterality Date  . INCISION / DRAINAGE HAND / FINGER Right 06/2008   hand  . SPINE SURGERY  12/2006    Allergies  Allergen Reactions  . Ace Inhibitors Other (See Comments)    Reaction unknown  . Hydrochlorothiazide Other (See Comments)    Reaction unknown  . Morphine And Related Other (See Comments)    Hallucinations      Medication List       Accurate as of 12/10/15  3:03 PM. Always use your most recent med list.          amLODipine 10 MG tablet Commonly known as:  NORVASC TAKE (1) TABLET DAILY FOR HIGH BLOOD PRESSURE.   aspirin EC 81 MG tablet Take 1 tablet (81 mg total) by mouth daily.   CALCIUM 600+D 600-200 MG-UNIT Tabs Generic drug:  Calcium Carbonate-Vitamin D Take 1 tablet by mouth daily.   citalopram 10 MG tablet Commonly known as:  CELEXA One daily to help depression and anxiety   furosemide 20 MG tablet Commonly known as:  LASIX Take 20 mg by mouth daily.   ibuprofen 200 MG tablet Commonly known as:  ADVIL,MOTRIN Take 400 mg by mouth every 6 (six) hours as needed (For headache, pain or fever.). Take one as needed   losartan 100 MG tablet Commonly known as:  COZAAR TAKE (1) TABLET DAILY FOR HIGH BLOOD PRESSURE.  metoprolol succinate 25 MG 24 hr tablet Commonly known as:  TOPROL-XL TAKE 1 TABLET ONCE DAILY TO CONTROL BLOOD PRESSURE.   polyethylene glycol packet Commonly known as:  MIRALAX / GLYCOLAX Take 17 g by mouth daily as needed for moderate constipation.   PROCTO-PAK 1 % Crea Generic drug:  hydrocortisone Apply topically 2 (two) times daily as  needed.   risperiDONE 0.5 MG tablet Commonly known as:  RISPERDAL One at bedtime to prevent hallucinations and anxiety       Review of Systems  Constitutional: Negative for activity change, appetite change and fatigue.  HENT: Positive for hearing loss. Negative for ear pain.   Eyes: Negative.   Respiratory: Negative.  Negative for shortness of breath.   Cardiovascular: Positive for leg swelling. Negative for chest pain and palpitations.       HTN  Gastrointestinal: Negative.   Endocrine: Negative.   Genitourinary:       Incontinence  Musculoskeletal: Positive for arthralgias (in feet).       Pain in both hands: R>L.  Skin: Negative for rash (Erythematous rash of the lower legs nonspecific nature).  Neurological: Negative.   Hematological: Negative.   Psychiatric/Behavioral: Positive for confusion and sleep disturbance (Wakes at 3 AM. Full of energy then. Uses Motrin PM to get back to sleep. ). The patient is nervous/anxious.        Hx of hallucinations.    Immunization History  Administered Date(s) Administered  . Influenza Whole 10/24/2011, 10/24/2012  . Influenza-Unspecified 11/06/2013, 11/04/2015  . PPD Test 01/11/2015  . Pneumococcal Polysaccharide-23 01/23/2009  . Td 01/23/2009   Pertinent  Health Maintenance Due  Topic Date Due  . DEXA SCAN  07/26/1984  . PNA vac Low Risk Adult (2 of 2 - PCV13) 01/23/2010  . INFLUENZA VACCINE  Completed   Fall Risk  09/21/2015 06/15/2015 01/05/2015 12/29/2014 12/29/2014  Falls in the past year? No No No No No  Number falls in past yr: - - - - -  Injury with Fall? - - - - -   Functional Status Survey:    Vitals:   12/10/15 1115  BP: (!) 145/77  Pulse: 65  Resp: 18  Temp: 97.6 F (36.4 C)  SpO2: 98%  Weight: 124 lb (56.2 kg)  Height: 5\' 2"  (1.575 m)   Body mass index is 22.68 kg/m. Physical Exam  Constitutional: She appears well-developed and well-nourished. No distress.  HENT:  Head: Normocephalic and atraumatic.    Severe hearing loss bilaterally. Wearing hearing aids.  Eyes: Conjunctivae and EOM are normal. Pupils are equal, round, and reactive to light.  Neck: Normal range of motion. Neck supple. No JVD present. No tracheal deviation present. No thyromegaly present.  Cardiovascular: Normal rate, regular rhythm and normal heart sounds.  Exam reveals no gallop and no friction rub.   No murmur heard. Pulmonary/Chest: No respiratory distress. She has no wheezes. She has no rales. She exhibits no tenderness.  Abdominal: Bowel sounds are normal. She exhibits no distension and no mass. There is no tenderness.  Genitourinary: Rectal exam shows guaiac negative stool.  Genitourinary Comments: Examined 06/15/15: Firm area at the top of the vagina. Prolapse of the vaginal walls. Loss of rectal sphincter tone.  Musculoskeletal: Normal range of motion. She exhibits edema. She exhibits no tenderness.  Pain in hands. Right 3rd trigger finger. Uses walker due to unstable gait. Left 2nd toe is deviated to the great toe and has elevated DIP joint that rubs on her shoe. Trace edema  R+L ankles  Lymphadenopathy:    She has no cervical adenopathy.  Neurological: She is alert. No cranial nerve deficit. Coordination normal.  12/29/14 MMSE 26/30 . Failed clock drawing.  Skin: No rash (Nonspecific erythematous rash in both lower legs.) noted. No erythema. No pallor.  Left shin open wound, mild erythema and warmth peri wound.   Psychiatric: She has a normal mood and affect. Her behavior is normal.    Labs reviewed:  Recent Labs  12/10/14 1546  09/09/15 10/14/15 11/10/15 1452  NA 138  < > 139 139 138  K 3.2*  < > 3.4 4.4 4.0  CL 101  --   --   --  100*  CO2 25  --   --   --  30  GLUCOSE 92  --   --   --  105*  BUN 17  < > 23* 25* 20  CREATININE 0.80  < > 0.8 1.0 0.70  CALCIUM 9.2  --   --   --  8.8*  < > = values in this interval not displayed.  Recent Labs  12/10/14 1546  AST 24  ALT 19  ALKPHOS 76  BILITOT  0.8  PROT 7.9  ALBUMIN 5.0    Recent Labs  12/10/14 1546 11/10/15 1452  WBC 8.4 8.1  NEUTROABS 4.9 5.8  HGB 14.2 13.1  HCT 41.3 38.5  MCV 99.8 98.2  PLT 438* 343    Lab Results  Component Value Date   HGBA1C 5.6 12/11/2014   Lab Results  Component Value Date   CHOL 178 12/11/2014   HDL 60 12/11/2014   LDLCALC 97 12/11/2014   TRIG 106 12/11/2014   CHOLHDL 3.0 12/11/2014    Significant Diagnostic Results in last 30 days:  Ct Cervical Spine Wo Contrast  Result Date: 11/10/2015 CLINICAL DATA:  Patient fell with laceration to right eye. Neck pain. EXAM: CT CERVICAL SPINE WITHOUT CONTRAST TECHNIQUE: Multidetector CT imaging of the cervical spine was performed without intravenous contrast. Multiplanar CT image reconstructions were also generated. COMPARISON:  None. FINDINGS: Alignment: 2 mm of retrolisthesis of C3 on C4 and C4 on C5. Dextroconvex curvature of the cervical spine with apex at C4-5. Skull base and vertebrae: No acute fracture bone destruction. Craniocervical relationship is maintained. The occipital condyles articulate with C1. Odontoid process appears intact. Calcification of the transverse ligament. Osteoarthritis of the atlantodental interval. Soft tissues and spinal canal: No paraspinal hematoma or soft tissue swelling. No significant central canal stenosis. Disc levels: There is degenerative disc space narrowing most marked from C4 through C7. Lesser degree of disc space narrowing, mild to moderate at C3-4. Upper chest: Atherosclerosis of the visualized aortic arch. Scarring at the right lung apex with associated coarse calcifications. 8 mm bilateral thyroid nodules without worrisome features nor of size for additional workup are incidentally noted. Other: Atherosclerotic calcifications of both vertebral arteries. IMPRESSION: No acute cervical spine abnormality. Cervical spondylosis with dextroscoliosis. Electronically Signed   By: Ashley Royalty M.D.   On: 11/10/2015 17:14     Assessment/Plan Hallucinations persisted confusion, increased urinary incontinence, frequently falling, last treated UTI 10/241/7. She is afebrile, no new focal neurological symptoms. Obtain CBC CMP TSH UA C/S MMSE. Pt to eval and treat as indicated.     Essential hypertension Blood pressure is controlled, continue Amlodipine 10mg  daily, Losartan 100mg  daily, Metoprolol 25mg  daily. Furosemide 20mg  daily.   Constipation Stable, continue MiraLax daily prn.   Urinary tract infection 11/16/15 urine culture Strep, Ampicillin 500mg  q8h  x 7 days.    Edema Improved since Furosemide 20mg  daily.   Depression, major Her mood is stable, continue Risperdal 0.5mg ,  Celexa 10mg      Urinary incontinence Persists, repeat UA C/S  Abnormality of gait Worse, frequent falling, PT to eval and tx  Injury of left shin Skin cut, mild erythema and warmth peri wound, apply Silvadene bid until healed.      Family/ staff Communication: ? SNF  Labs/tests ordered:  CBC CMP TSH UA C/S MMSE

## 2015-12-10 NOTE — Assessment & Plan Note (Signed)
Worse, frequent falling, PT to eval and tx

## 2015-12-10 NOTE — Assessment & Plan Note (Signed)
Improved since Furosemide 20mg  daily.

## 2015-12-13 DIAGNOSIS — N390 Urinary tract infection, site not specified: Secondary | ICD-10-CM | POA: Diagnosis not present

## 2015-12-13 DIAGNOSIS — D649 Anemia, unspecified: Secondary | ICD-10-CM | POA: Diagnosis not present

## 2015-12-13 DIAGNOSIS — E039 Hypothyroidism, unspecified: Secondary | ICD-10-CM | POA: Diagnosis not present

## 2015-12-13 DIAGNOSIS — I10 Essential (primary) hypertension: Secondary | ICD-10-CM | POA: Diagnosis not present

## 2015-12-13 LAB — CBC AND DIFFERENTIAL
HEMATOCRIT: 38 (ref 36–46)
HEMOGLOBIN: 12.6 (ref 12.0–16.0)
PLATELETS: 413 — AB (ref 150–399)
WBC: 9.1

## 2015-12-13 LAB — HEPATIC FUNCTION PANEL
ALK PHOS: 71 (ref 25–125)
ALT: 14 (ref 7–35)
AST: 18 (ref 13–35)
BILIRUBIN, TOTAL: 0.5

## 2015-12-13 LAB — BASIC METABOLIC PANEL
BUN: 18 (ref 4–21)
Creatinine: 0.8 (ref 0.5–1.1)
GLUCOSE: 198
Potassium: 3.7 (ref 3.4–5.3)
SODIUM: 135 — AB (ref 137–147)

## 2015-12-13 LAB — TSH: TSH: 5.99 — AB (ref 0.41–5.90)

## 2015-12-14 ENCOUNTER — Encounter: Payer: Self-pay | Admitting: Nurse Practitioner

## 2015-12-14 DIAGNOSIS — E039 Hypothyroidism, unspecified: Secondary | ICD-10-CM | POA: Insufficient documentation

## 2015-12-21 ENCOUNTER — Encounter: Payer: Self-pay | Admitting: Nurse Practitioner

## 2015-12-21 ENCOUNTER — Non-Acute Institutional Stay: Payer: Medicare Other | Admitting: Nurse Practitioner

## 2015-12-21 DIAGNOSIS — K5900 Constipation, unspecified: Secondary | ICD-10-CM

## 2015-12-21 DIAGNOSIS — M6281 Muscle weakness (generalized): Secondary | ICD-10-CM | POA: Diagnosis not present

## 2015-12-21 DIAGNOSIS — F329 Major depressive disorder, single episode, unspecified: Secondary | ICD-10-CM | POA: Diagnosis not present

## 2015-12-21 DIAGNOSIS — R443 Hallucinations, unspecified: Secondary | ICD-10-CM

## 2015-12-21 DIAGNOSIS — R296 Repeated falls: Secondary | ICD-10-CM | POA: Diagnosis not present

## 2015-12-21 DIAGNOSIS — R2681 Unsteadiness on feet: Secondary | ICD-10-CM | POA: Diagnosis not present

## 2015-12-21 DIAGNOSIS — N390 Urinary tract infection, site not specified: Secondary | ICD-10-CM | POA: Diagnosis not present

## 2015-12-21 DIAGNOSIS — F320 Major depressive disorder, single episode, mild: Secondary | ICD-10-CM

## 2015-12-21 DIAGNOSIS — M6530 Trigger finger, unspecified finger: Secondary | ICD-10-CM | POA: Diagnosis not present

## 2015-12-21 DIAGNOSIS — R32 Unspecified urinary incontinence: Secondary | ICD-10-CM | POA: Diagnosis not present

## 2015-12-21 DIAGNOSIS — E039 Hypothyroidism, unspecified: Secondary | ICD-10-CM

## 2015-12-21 DIAGNOSIS — F411 Generalized anxiety disorder: Secondary | ICD-10-CM | POA: Diagnosis not present

## 2015-12-21 DIAGNOSIS — R479 Unspecified speech disturbances: Secondary | ICD-10-CM | POA: Diagnosis not present

## 2015-12-21 DIAGNOSIS — R269 Unspecified abnormalities of gait and mobility: Secondary | ICD-10-CM | POA: Diagnosis not present

## 2015-12-21 DIAGNOSIS — I10 Essential (primary) hypertension: Secondary | ICD-10-CM

## 2015-12-21 DIAGNOSIS — G459 Transient cerebral ischemic attack, unspecified: Secondary | ICD-10-CM | POA: Diagnosis not present

## 2015-12-21 DIAGNOSIS — E876 Hypokalemia: Secondary | ICD-10-CM | POA: Diagnosis not present

## 2015-12-21 DIAGNOSIS — R609 Edema, unspecified: Secondary | ICD-10-CM

## 2015-12-21 DIAGNOSIS — G4700 Insomnia, unspecified: Secondary | ICD-10-CM | POA: Diagnosis not present

## 2015-12-21 DIAGNOSIS — H9190 Unspecified hearing loss, unspecified ear: Secondary | ICD-10-CM | POA: Diagnosis not present

## 2015-12-21 DIAGNOSIS — M79643 Pain in unspecified hand: Secondary | ICD-10-CM | POA: Diagnosis not present

## 2015-12-21 NOTE — Assessment & Plan Note (Signed)
Stable, continue MiraLax daily prn.

## 2015-12-21 NOTE — Assessment & Plan Note (Signed)
persisted confusion, continue Risperdal 0.25mg  daily

## 2015-12-21 NOTE — Assessment & Plan Note (Signed)
12/13/15 wbc 9.1, Hgb 12.6, plt 413, Na 135, K 3.7, Bun 18, creat 0.80, TSH 5.99, MMSE 26/30 12/14/15 Levothyroxine 45mcg daily, TSH 12 weeks.

## 2015-12-21 NOTE — Assessment & Plan Note (Addendum)
Blood pressure is controlled, continue Amlodipine 10mg  daily, Losartan 100mg  daily, Metoprolol 25mg  daily. Furosemide 20mg  daily.  12/13/15 wbc 9.1, Hgb 12.6, plt 413, Na 135, K 3.7, Bun 18, creat 0.80, TSH 5.99, MMSE 26/30

## 2015-12-21 NOTE — Progress Notes (Signed)
Location:  [place] Room Number: 8 Place of Service:  ALF [phone]) Provider: Boaz Berisha, Manxie  NP  Jeanmarie Hubert, MD  Patient Care Team: Estill Dooms, MD as PCP - General (Internal Medicine) [facility] Gaynelle Arabian, MD as Consulting Physician (Orthopedic Surgery) Kali Ambler Otho Darner, NP as Nurse Practitioner (Internal Medicine)  Extended Emergency Contact Information Primary Emergency Contact: Peri [place] Mobile Phone: [phone] Relation: Daughter Secondary Emergency Contact: [facility] Phone: [phone] Relation: Other  Code Status:  DNR Goals of care: Advanced Directive information Advanced Directives 12/21/2015  Does Patient Have a Medical Advance Directive? Yes  Type of Paramedic of [place];Living will;Out of facility DNR (pink MOST or yellow form)  Does patient want to make changes to medical advance directive? No - Patient declined  Copy of [place] in Chart? Yes  Pre-existing out of facility DNR order (yellow form or pink MOST form) Yellow form placed in chart (order not valid for inpatient use)     Chief Complaint  Patient presents with  . Acute Visit    UTI    HPI:  Pt is a 82 y.o. female seen today for an acute visit for UTI, urine culture 12/16/15 C Freudii >100,000c/ml, 7 day course of Nitrofurantoin 100mg  bid started subsequently, tolerated well.   Hx of hallucination, insomnia, anxiety, stable while on Risperdal 0.5mg  qd, Celexa 10mg , HTN is controlled, taking Losartan, Metoprolol, and Amlodipine. Chronic edema BLE is managed with Furosemide 20mg . TSH 5.99 12/13/15, started Levothyroxine 4mcg 12/14/15, f/u TSH scheduled.      Past Medical History:     Past Medical History:  Diagnosis Date  . Abnormality of gait 03/15/2010  . Anxiety state, unspecified 03/15/2010  . Depression, major 06/15/2015  . Depressive disorder, not elsewhere classified 03/15/2010    . Dysphagia, unspecified(787.20) 03/15/2010  . Generalized anxiety disorder 06/23/2014  . Herpes zoster without mention of complication [id number]  . Other alteration of consciousness 03/15/2010  . Pain in hand 08/13/12  . Personal history of fall 03/15/2010  . Senile osteoporosis 03/15/2010  . Unspecified constipation 03/15/2010  . Unspecified essential hypertension 03/15/2010  . Unspecified hearing loss 08/22/2011  . Unspecified urinary incontinence 03/15/2010  . Urinary incontinence 03/15/2010  . Vaginal prolapse 06/15/2015   Past Surgical History:  Procedure Laterality Date  . INCISION / DRAINAGE HAND / FINGER Right 06/2008   hand  . SPINE SURGERY  12/2006    Allergies  Allergen Reactions  . Ace Inhibitors Other (See Comments)    Reaction unknown  . Hydrochlorothiazide Other (See Comments)    Reaction unknown  . Morphine And Related Other (See Comments)    Hallucinations      Medication List       Accurate as of 12/21/15  3:13 PM. Always use your most recent med list.          amLODipine 10 MG tablet Commonly known as:  NORVASC TAKE (1) TABLET DAILY FOR HIGH BLOOD PRESSURE.   aspirin EC 81 MG tablet Take 1 tablet (81 mg total) by mouth daily.   CALCIUM 600+D 600-200 MG-UNIT Tabs Generic drug:  Calcium Carbonate-Vitamin D Take 1 tablet by mouth daily.   citalopram 10 MG tablet Commonly known as:  CELEXA One daily to help depression and anxiety   furosemide 20 MG tablet Commonly known as:  LASIX Take 20 mg by mouth daily.   ibuprofen 200 MG tablet Commonly known as:  ADVIL,MOTRIN Take  400 mg by mouth every 6 (six) hours as needed (For headache, pain or fever.). Take one as needed   levothyroxine 25 MCG tablet Commonly known as:  SYNTHROID, LEVOTHROID Take 25 mcg by mouth daily before breakfast.   losartan 100 MG tablet Commonly known as:  COZAAR TAKE (1) TABLET DAILY FOR HIGH BLOOD PRESSURE.   metoprolol succinate 25 MG 24 hr tablet Commonly known as:   TOPROL-XL TAKE 1 TABLET ONCE DAILY TO CONTROL BLOOD PRESSURE.   nitrofurantoin (macrocrystal-monohydrate) 100 MG capsule Commonly known as:  MACROBID Take 100 mg by mouth 2 (two) times daily.   polyethylene glycol packet Commonly known as:  MIRALAX / GLYCOLAX Take 17 g by mouth daily as needed for moderate constipation.   PROCTO-PAK 1 % Crea Generic drug:  hydrocortisone Apply topically 2 (two) times daily as needed.   risperiDONE 0.5 MG tablet Commonly known as:  RISPERDAL One at bedtime to prevent hallucinations and anxiety   saccharomyces boulardii 250 MG capsule Commonly known as:  FLORASTOR Take 250 mg by mouth 2 (two) times daily.   silver sulfADIAZINE 1 % cream Commonly known as:  SILVADENE Apply 1 application topically 2 (two) times daily. Apply to left shin until healed.       Review of Systems  Constitutional: Negative for activity change, appetite change and fatigue.  HENT: Positive for hearing loss. Negative for ear pain.   Eyes: Negative.   Respiratory: Negative.  Negative for shortness of breath.   Cardiovascular: Positive for leg swelling. Negative for chest pain and palpitations.       HTN  Gastrointestinal: Negative.   Endocrine: Negative.   Genitourinary:       Incontinence  Musculoskeletal: Positive for arthralgias (in feet).       Pain in both hands: R>L.  Skin: Negative for rash (Erythematous rash of the lower legs nonspecific nature).  Neurological: Negative.   Hematological: Negative.   Psychiatric/Behavioral: Positive for confusion and sleep disturbance (Wakes at 3 AM. Full of energy then. Uses Motrin PM to get back to sleep. ). The patient is nervous/anxious.        Hx of hallucinations.    Immunization History  Administered Date(s) Administered  . Influenza Whole 10/24/2011, 10/24/2012  . Influenza-Unspecified 11/06/2013, 11/04/2015  . PPD Test 01/11/2015  . Pneumococcal Polysaccharide-23 01/23/2009  . Td 01/23/2009   Pertinent   Health Maintenance Due  Topic Date Due  . DEXA SCAN  07/26/1984  . PNA vac Low Risk Adult (2 of 2 - PCV13) 01/23/2010  . INFLUENZA VACCINE  Completed   Fall Risk  09/21/2015 06/15/2015 01/05/2015 12/29/2014 12/29/2014  Falls in the past year? No No No No No  Number falls in past yr: - - - - -  Injury with Fall? - - - - -   Functional Status Survey:    Vitals:   12/21/15 1125  BP: 123/77  Pulse: 88  Resp: 18  Temp: 97.7 F (36.5 C)  SpO2: 92%  Weight: 124 lb (56.2 kg)  Height: 5\' 2"  (1.575 m)   Body mass index is 22.68 kg/m. Physical Exam  Constitutional: She appears well-developed and well-nourished. No distress.  HENT:  Head: Normocephalic and atraumatic.  Severe hearing loss bilaterally. Wearing hearing aids.  Eyes: Conjunctivae and EOM are normal. Pupils are equal, round, and reactive to light.  Neck: Normal range of motion. Neck supple. No JVD present. No tracheal deviation present. No thyromegaly present.  Cardiovascular: Normal rate, regular rhythm and normal heart sounds.  Exam reveals no gallop and no friction rub.   No murmur heard. Pulmonary/Chest: No respiratory distress. She has no wheezes. She has no rales. She exhibits no tenderness.  Abdominal: Bowel sounds are normal. She exhibits no distension and no mass. There is no tenderness.  Genitourinary: Rectal exam shows guaiac negative stool.  Genitourinary Comments: Examined 06/15/15: Firm area at the top of the vagina. Prolapse of the vaginal walls. Loss of rectal sphincter tone.  Musculoskeletal: Normal range of motion. She exhibits edema. She exhibits no tenderness.  Pain in hands. Right 3rd trigger finger. Uses walker due to unstable gait. Left 2nd toe is deviated to the great toe and has elevated DIP joint that rubs on her shoe. Trace edema R+L ankles  Lymphadenopathy:    She has no cervical adenopathy.  Neurological: She is alert. No cranial nerve deficit. Coordination normal.  12/29/14 MMSE 26/30 . Failed  clock drawing.  Skin: No rash (Nonspecific erythematous rash in both lower legs.) noted. No erythema. No pallor.  Left shin open wound, mild erythema and warmth peri wound.   Psychiatric: She has a normal mood and affect. Her behavior is normal.    Labs reviewed:  Recent Labs  09/09/15 10/14/15 11/10/15 1452  NA 139 139 138  K 3.4 4.4 4.0  CL  --   --  100*  CO2  --   --  30  GLUCOSE  --   --  105*  BUN 23* 25* 20  CREATININE 0.8 1.0 0.70  CALCIUM  --   --  8.8*   No results for input(s): AST, ALT, ALKPHOS, BILITOT, PROT, ALBUMIN in the last 8760 hours.  Recent Labs  11/10/15 1452  WBC 8.1  NEUTROABS 5.8  HGB 13.1  HCT 38.5  MCV 98.2  PLT 343    Lab Results  Component Value Date   HGBA1C 5.6 12/11/2014   Lab Results  Component Value Date   CHOL 178 12/11/2014   HDL 60 12/11/2014   LDLCALC 97 12/11/2014   TRIG 106 12/11/2014   CHOLHDL 3.0 12/11/2014    Significant Diagnostic Results in last 30 days:  No results found.  Assessment/Plan Urinary tract infection urine culture 12/16/15 C Freudii >100,000c/ml, 7 day course of Nitrofurantoin 100mg  bid started subsequently, tolerated well.    Essential hypertension Blood pressure is controlled, continue Amlodipine 10mg  daily, Losartan 100mg  daily, Metoprolol 25mg  daily. Furosemide 20mg  daily.  12/13/15 wbc 9.1, Hgb 12.6, plt 413, Na 135, K 3.7, Bun 18, creat 0.80, TSH 5.99, MMSE 26/30   Constipation Stable, continue MiraLax daily prn.   Hypothyroidism 12/13/15 wbc 9.1, Hgb 12.6, plt 413, Na 135, K 3.7, Bun 18, creat 0.80, TSH 5.99, MMSE 26/30 12/14/15 Levothyroxine 37mcg daily, TSH 12 weeks.    Hallucinations persisted confusion, continue Risperdal 0.25mg  daily    Edema Improved since Furosemide 20mg  daily. 12/13/15 wbc 9.1, Hgb 12.6, plt 413, Na 135, K 3.7, Bun 18, creat 0.80, TSH 5.99   Depression, major Her mood is stable, continue Risperdal 0.5mg ,  Celexa 10mg         Family/ staff  Communication: AL  Labs/tests ordered:  Urine culture dopne 12/16/15

## 2015-12-21 NOTE — Assessment & Plan Note (Signed)
Her mood is stable, continue Risperdal 0.5mg,  Celexa 10mg.  

## 2015-12-21 NOTE — Assessment & Plan Note (Signed)
Improved since Furosemide 20mg  daily. 12/13/15 wbc 9.1, Hgb 12.6, plt 413, Na 135, K 3.7, Bun 18, creat 0.80, TSH 5.99

## 2015-12-21 NOTE — Assessment & Plan Note (Signed)
urine culture 12/16/15 C Freudii >100,000c/ml, 7 day course of Nitrofurantoin 100mg  bid started subsequently, tolerated well.

## 2015-12-28 DIAGNOSIS — F411 Generalized anxiety disorder: Secondary | ICD-10-CM | POA: Diagnosis not present

## 2015-12-28 DIAGNOSIS — M79643 Pain in unspecified hand: Secondary | ICD-10-CM | POA: Diagnosis not present

## 2015-12-28 DIAGNOSIS — R32 Unspecified urinary incontinence: Secondary | ICD-10-CM | POA: Diagnosis not present

## 2015-12-28 DIAGNOSIS — G4700 Insomnia, unspecified: Secondary | ICD-10-CM | POA: Diagnosis not present

## 2015-12-28 DIAGNOSIS — H9190 Unspecified hearing loss, unspecified ear: Secondary | ICD-10-CM | POA: Diagnosis not present

## 2015-12-28 DIAGNOSIS — R296 Repeated falls: Secondary | ICD-10-CM | POA: Diagnosis not present

## 2015-12-28 DIAGNOSIS — E876 Hypokalemia: Secondary | ICD-10-CM | POA: Diagnosis not present

## 2015-12-28 DIAGNOSIS — R2681 Unsteadiness on feet: Secondary | ICD-10-CM | POA: Diagnosis not present

## 2015-12-28 DIAGNOSIS — M6530 Trigger finger, unspecified finger: Secondary | ICD-10-CM | POA: Diagnosis not present

## 2015-12-28 DIAGNOSIS — I10 Essential (primary) hypertension: Secondary | ICD-10-CM | POA: Diagnosis not present

## 2015-12-28 DIAGNOSIS — G459 Transient cerebral ischemic attack, unspecified: Secondary | ICD-10-CM | POA: Diagnosis not present

## 2015-12-28 DIAGNOSIS — M6281 Muscle weakness (generalized): Secondary | ICD-10-CM | POA: Diagnosis not present

## 2015-12-28 DIAGNOSIS — R479 Unspecified speech disturbances: Secondary | ICD-10-CM | POA: Diagnosis not present

## 2015-12-28 DIAGNOSIS — F329 Major depressive disorder, single episode, unspecified: Secondary | ICD-10-CM | POA: Diagnosis not present

## 2015-12-28 DIAGNOSIS — R269 Unspecified abnormalities of gait and mobility: Secondary | ICD-10-CM | POA: Diagnosis not present

## 2015-12-29 DIAGNOSIS — R296 Repeated falls: Secondary | ICD-10-CM | POA: Diagnosis not present

## 2015-12-29 DIAGNOSIS — E876 Hypokalemia: Secondary | ICD-10-CM | POA: Diagnosis not present

## 2015-12-29 DIAGNOSIS — G459 Transient cerebral ischemic attack, unspecified: Secondary | ICD-10-CM | POA: Diagnosis not present

## 2015-12-29 DIAGNOSIS — R479 Unspecified speech disturbances: Secondary | ICD-10-CM | POA: Diagnosis not present

## 2015-12-29 DIAGNOSIS — M6281 Muscle weakness (generalized): Secondary | ICD-10-CM | POA: Diagnosis not present

## 2015-12-29 DIAGNOSIS — R2681 Unsteadiness on feet: Secondary | ICD-10-CM | POA: Diagnosis not present

## 2015-12-31 ENCOUNTER — Encounter: Payer: Self-pay | Admitting: Nurse Practitioner

## 2015-12-31 DIAGNOSIS — F0390 Unspecified dementia without behavioral disturbance: Secondary | ICD-10-CM | POA: Insufficient documentation

## 2016-01-04 ENCOUNTER — Encounter: Payer: Self-pay | Admitting: Nurse Practitioner

## 2016-01-04 ENCOUNTER — Non-Acute Institutional Stay: Payer: Medicare Other | Admitting: Nurse Practitioner

## 2016-01-04 DIAGNOSIS — E039 Hypothyroidism, unspecified: Secondary | ICD-10-CM

## 2016-01-04 DIAGNOSIS — F321 Major depressive disorder, single episode, moderate: Secondary | ICD-10-CM

## 2016-01-04 DIAGNOSIS — S8992XD Unspecified injury of left lower leg, subsequent encounter: Secondary | ICD-10-CM

## 2016-01-04 DIAGNOSIS — F0390 Unspecified dementia without behavioral disturbance: Secondary | ICD-10-CM | POA: Diagnosis not present

## 2016-01-04 DIAGNOSIS — I10 Essential (primary) hypertension: Secondary | ICD-10-CM | POA: Diagnosis not present

## 2016-01-04 DIAGNOSIS — K5900 Constipation, unspecified: Secondary | ICD-10-CM

## 2016-01-04 DIAGNOSIS — G459 Transient cerebral ischemic attack, unspecified: Secondary | ICD-10-CM | POA: Diagnosis not present

## 2016-01-04 DIAGNOSIS — R479 Unspecified speech disturbances: Secondary | ICD-10-CM | POA: Diagnosis not present

## 2016-01-04 DIAGNOSIS — R443 Hallucinations, unspecified: Secondary | ICD-10-CM | POA: Diagnosis not present

## 2016-01-04 DIAGNOSIS — R296 Repeated falls: Secondary | ICD-10-CM | POA: Diagnosis not present

## 2016-01-04 DIAGNOSIS — M6281 Muscle weakness (generalized): Secondary | ICD-10-CM | POA: Diagnosis not present

## 2016-01-04 DIAGNOSIS — E876 Hypokalemia: Secondary | ICD-10-CM | POA: Diagnosis not present

## 2016-01-04 DIAGNOSIS — R609 Edema, unspecified: Secondary | ICD-10-CM | POA: Diagnosis not present

## 2016-01-04 DIAGNOSIS — R2681 Unsteadiness on feet: Secondary | ICD-10-CM | POA: Diagnosis not present

## 2016-01-04 NOTE — Assessment & Plan Note (Signed)
persisted confusion, continue Risperdal 0.5mg  daily

## 2016-01-04 NOTE — Assessment & Plan Note (Signed)
Stable, continue MiraLax daily prn.

## 2016-01-04 NOTE — Assessment & Plan Note (Signed)
Her mood is stable, continue Risperdal 0.5mg,  Celexa 10mg.  

## 2016-01-04 NOTE — Assessment & Plan Note (Signed)
12/13/15 wbc 9.1, Hgb 12.6, plt 413, Na 135, K 3.7, Bun 18, creat 0.80, TSH 5.99, MMSE 26/30 12/14/15 Levothyroxine 86mcg daily, TSH 12 weeks.

## 2016-01-04 NOTE — Assessment & Plan Note (Signed)
Improved since Furosemide 20mg  daily. 12/13/15 wbc 9.1, Hgb 12.6, plt 413, Na 135, K 3.7, Bun 18, creat 0.80, TSH 5.99

## 2016-01-04 NOTE — Assessment & Plan Note (Signed)
Superficial healed, peri wound still mild erythematous, apply Bactroban oint daily until healed.

## 2016-01-04 NOTE — Assessment & Plan Note (Signed)
12/12/15 mmse 20/30, continue AL

## 2016-01-04 NOTE — Assessment & Plan Note (Signed)
Blood pressure is controlled, continue Amlodipine 10mg  daily, Losartan 100mg  daily, Metoprolol 25mg  daily. Furosemide 20mg  daily.  12/13/15 wbc 9.1, Hgb 12.6, plt 413, Na 135, K 3.7, Bun 18, creat 0.80, TSH 5.99, MMSE 26/30

## 2016-01-04 NOTE — Progress Notes (Signed)
Location:  [place] Room Number: 8 Place of Service:  ALF [phone]) Provider:  Neliah Cuyler, [place]  NP  Jeanmarie Hubert, MD  Patient Care Team: Estill Dooms, MD as PCP - General (Internal Medicine) [facility] Gaynelle Arabian, MD as Consulting Physician (Orthopedic Surgery) Rowdy Guerrini Otho Darner, NP as Nurse Practitioner (Internal Medicine)  Extended Emergency Contact Information Primary Emergency Contact: Peri [place] Mobile Phone: [phone] Relation: Daughter Secondary Emergency Contact: [facility] Phone: [phone] Relation: Other  Code Status:  DNR Goals of care: Advanced Directive information Advanced Directives 01/04/2016  Does Patient Have a Medical Advance Directive? Yes  Type of Paramedic of [place];Living will;Out of facility DNR (pink MOST or yellow form)  Does patient want to make changes to medical advance directive? No - Patient declined  Copy of [place] in Chart? Yes  Pre-existing out of facility DNR order (yellow form or pink MOST form) Yellow form placed in chart (order not valid for inpatient use)     Chief Complaint  Patient presents with  . Medical Management of Chronic Issues    HPI:  Pt is a 82 y.o. female seen today for medical management of chronic diseases.    Hx of hallucination, insomnia, anxiety, stable while on Risperdal 0.5mg  qd, Celexa 10mg , HTN is controlled, taking Losartan, Metoprolol, and Amlodipine. Chronic edema BLE is managed with Furosemide 20mg . TSH 5.99 12/13/15, started Levothyroxine 44mcg 12/14/15, f/u TSH scheduled.   Past Medical History:  Diagnosis Date  . Abnormality of gait 03/15/2010  . Anxiety state, unspecified 03/15/2010  . Depression, major 06/15/2015  . Depressive disorder, not elsewhere classified 03/15/2010  . Dysphagia, unspecified(787.20) 03/15/2010  . Generalized anxiety disorder 06/23/2014  . Herpes zoster without mention of  complication [id number]  . Other alteration of consciousness 03/15/2010  . Pain in hand 08/13/12  . Personal history of fall 03/15/2010  . Senile osteoporosis 03/15/2010  . Unspecified constipation 03/15/2010  . Unspecified essential hypertension 03/15/2010  . Unspecified hearing loss 08/22/2011  . Unspecified urinary incontinence 03/15/2010  . Urinary incontinence 03/15/2010  . Vaginal prolapse 06/15/2015   Past Surgical History:  Procedure Laterality Date  . INCISION / DRAINAGE HAND / FINGER Right 06/2008   hand  . SPINE SURGERY  12/2006    Allergies  Allergen Reactions  . Ace Inhibitors Other (See Comments)    Reaction unknown  . Hydrochlorothiazide Other (See Comments)    Reaction unknown  . Morphine And Related Other (See Comments)    Hallucinations      Medication List       Accurate as of 01/04/16  4:05 PM. Always use your most recent med list.          amLODipine 10 MG tablet Commonly known as:  NORVASC TAKE (1) TABLET DAILY FOR HIGH BLOOD PRESSURE.   aspirin EC 81 MG tablet Take 1 tablet (81 mg total) by mouth daily.   CALCIUM 600+D 600-200 MG-UNIT Tabs Generic drug:  Calcium Carbonate-Vitamin D Take 1 tablet by mouth daily.   citalopram 10 MG tablet Commonly known as:  CELEXA One daily to help depression and anxiety   furosemide 20 MG tablet Commonly known as:  LASIX Take 20 mg by mouth daily.   ibuprofen 200 MG tablet Commonly known as:  ADVIL,MOTRIN Take 400 mg by mouth every 6 (six) hours as needed (For headache, pain or fever.). Take one as needed   levothyroxine 25 MCG tablet Commonly known  as:  SYNTHROID, LEVOTHROID Take 25 mcg by mouth daily before breakfast.   losartan 100 MG tablet Commonly known as:  COZAAR TAKE (1) TABLET DAILY FOR HIGH BLOOD PRESSURE.   metoprolol succinate 25 MG 24 hr tablet Commonly known as:  TOPROL-XL TAKE 1 TABLET ONCE DAILY TO CONTROL BLOOD PRESSURE.   polyethylene glycol packet Commonly known as:  MIRALAX /  GLYCOLAX Take 17 g by mouth daily as needed for moderate constipation.   PROCTO-PAK 1 % Crea Generic drug:  hydrocortisone Apply topically 2 (two) times daily as needed.   risperiDONE 0.5 MG tablet Commonly known as:  RISPERDAL One at bedtime to prevent hallucinations and anxiety   silver sulfADIAZINE 1 % cream Commonly known as:  SILVADENE Apply 1 application topically 2 (two) times daily. Apply to left shin until healed.       Review of Systems  Constitutional: Negative for activity change, appetite change and fatigue.  HENT: Positive for hearing loss. Negative for ear pain.   Eyes: Negative.   Respiratory: Negative.  Negative for shortness of breath.   Cardiovascular: Positive for leg swelling. Negative for chest pain and palpitations.       HTN  Gastrointestinal: Negative.   Endocrine: Negative.   Genitourinary:       Incontinence  Musculoskeletal: Positive for arthralgias (in feet).       Pain in both hands: R>L.  Skin: Negative for rash (Erythematous rash of the lower legs nonspecific nature).  Neurological: Negative.   Hematological: Negative.   Psychiatric/Behavioral: Positive for confusion and sleep disturbance (Wakes at 3 AM. Full of energy then. Uses Motrin PM to get back to sleep. ). The patient is nervous/anxious.        Hx of hallucinations.    Immunization History  Administered Date(s) Administered  . Influenza Whole 10/24/2011, 10/24/2012  . Influenza-Unspecified 11/06/2013, 11/04/2015  . PPD Test 01/11/2015  . Pneumococcal Polysaccharide-23 01/23/2009  . Td 01/23/2009   Pertinent  Health Maintenance Due  Topic Date Due  . DEXA SCAN  07/26/1984  . PNA vac Low Risk Adult (2 of 2 - PCV13) 01/23/2010  . INFLUENZA VACCINE  Completed   Fall Risk  09/21/2015 06/15/2015 01/05/2015 12/29/2014 12/29/2014  Falls in the past year? No No No No No  Number falls in past yr: - - - - -  Injury with Fall? - - - - -   Functional Status Survey:    Vitals:    01/04/16 1237  BP: 124/68  Pulse: 70  Resp: 18  Temp: 97.4 F (36.3 C)  SpO2: 97%  Weight: 123 lb 8 oz (56 kg)  Height: 5\' 2"  (1.575 m)   Body mass index is 22.59 kg/m. Physical Exam  Constitutional: She appears well-developed and well-nourished. No distress.  HENT:  Head: Normocephalic and atraumatic.  Severe hearing loss bilaterally. Wearing hearing aids.  Eyes: Conjunctivae and EOM are normal. Pupils are equal, round, and reactive to light.  Neck: Normal range of motion. Neck supple. No JVD present. No tracheal deviation present. No thyromegaly present.  Cardiovascular: Normal rate, regular rhythm and normal heart sounds.  Exam reveals no gallop and no friction rub.   No murmur heard. Pulmonary/Chest: No respiratory distress. She has no wheezes. She has no rales. She exhibits no tenderness.  Abdominal: Bowel sounds are normal. She exhibits no distension and no mass. There is no tenderness.  Genitourinary: Rectal exam shows guaiac negative stool.  Genitourinary Comments: Examined 06/15/15: Firm area at the top of the  vagina. Prolapse of the vaginal walls. Loss of rectal sphincter tone.  Musculoskeletal: Normal range of motion. She exhibits edema. She exhibits no tenderness.  Pain in hands. Right 3rd trigger finger. Uses walker due to unstable gait. Left 2nd toe is deviated to the great toe and has elevated DIP joint that rubs on her shoe. Trace edema R+L ankles  Lymphadenopathy:    She has no cervical adenopathy.  Neurological: She is alert. No cranial nerve deficit. Coordination normal.  12/29/14 MMSE 26/30 . Failed clock drawing.  Skin: No rash (Nonspecific erythematous rash in both lower legs.) noted. No erythema. No pallor.  Left shin open wound, mild erythema and warmth peri wound.   Psychiatric: She has a normal mood and affect. Her behavior is normal.    Labs reviewed:  Recent Labs  09/09/15 10/14/15 11/10/15 1452  NA 139 139 138  K 3.4 4.4 4.0  CL  --   --  100*   CO2  --   --  30  GLUCOSE  --   --  105*  BUN 23* 25* 20  CREATININE 0.8 1.0 0.70  CALCIUM  --   --  8.8*   No results for input(s): AST, ALT, ALKPHOS, BILITOT, PROT, ALBUMIN in the last 8760 hours.  Recent Labs  11/10/15 1452  WBC 8.1  NEUTROABS 5.8  HGB 13.1  HCT 38.5  MCV 98.2  PLT 343    Lab Results  Component Value Date   HGBA1C 5.6 12/11/2014   Lab Results  Component Value Date   CHOL 178 12/11/2014   HDL 60 12/11/2014   LDLCALC 97 12/11/2014   TRIG 106 12/11/2014   CHOLHDL 3.0 12/11/2014    Significant Diagnostic Results in last 30 days:  No results found.  Assessment/Plan Essential hypertension Blood pressure is controlled, continue Amlodipine 10mg  daily, Losartan 100mg  daily, Metoprolol 25mg  daily. Furosemide 20mg  daily.  12/13/15 wbc 9.1, Hgb 12.6, plt 413, Na 135, K 3.7, Bun 18, creat 0.80, TSH 5.99, MMSE 26/30    Constipation Stable, continue MiraLax daily prn.   Hypothyroidism 12/13/15 wbc 9.1, Hgb 12.6, plt 413, Na 135, K 3.7, Bun 18, creat 0.80, TSH 5.99, MMSE 26/30 12/14/15 Levothyroxine 33mcg daily, TSH 12 weeks.    Senile dementia 12/12/15 mmse 20/30, continue AL  Hallucinations persisted confusion, continue Risperdal 0.5mg  daily    Edema Improved since Furosemide 20mg  daily. 12/13/15 wbc 9.1, Hgb 12.6, plt 413, Na 135, K 3.7, Bun 18, creat 0.80, TSH 5.99  Depression, major Her mood is stable, continue Risperdal 0.5mg ,  Celexa 10mg     Injury of left shin Superficial healed, peri wound still mild erythematous, apply Bactroban oint daily until healed.      Family/ staff Communication: AL  Labs/tests ordered:  none

## 2016-01-06 DIAGNOSIS — R2681 Unsteadiness on feet: Secondary | ICD-10-CM | POA: Diagnosis not present

## 2016-01-06 DIAGNOSIS — R479 Unspecified speech disturbances: Secondary | ICD-10-CM | POA: Diagnosis not present

## 2016-01-06 DIAGNOSIS — M6281 Muscle weakness (generalized): Secondary | ICD-10-CM | POA: Diagnosis not present

## 2016-01-06 DIAGNOSIS — G459 Transient cerebral ischemic attack, unspecified: Secondary | ICD-10-CM | POA: Diagnosis not present

## 2016-01-06 DIAGNOSIS — R296 Repeated falls: Secondary | ICD-10-CM | POA: Diagnosis not present

## 2016-01-06 DIAGNOSIS — R7309 Other abnormal glucose: Secondary | ICD-10-CM | POA: Diagnosis not present

## 2016-01-06 DIAGNOSIS — Z13220 Encounter for screening for lipoid disorders: Secondary | ICD-10-CM | POA: Diagnosis not present

## 2016-01-06 DIAGNOSIS — E876 Hypokalemia: Secondary | ICD-10-CM | POA: Diagnosis not present

## 2016-01-06 LAB — LIPID PANEL
Cholesterol: 176 (ref 0–200)
HDL: 89 — AB (ref 35–70)
LDL Cholesterol: 77
Triglycerides: 48 (ref 40–160)

## 2016-01-06 LAB — HEMOGLOBIN A1C: Hemoglobin A1C: 5.6

## 2016-01-10 DIAGNOSIS — R296 Repeated falls: Secondary | ICD-10-CM | POA: Diagnosis not present

## 2016-01-10 DIAGNOSIS — M6281 Muscle weakness (generalized): Secondary | ICD-10-CM | POA: Diagnosis not present

## 2016-01-10 DIAGNOSIS — G459 Transient cerebral ischemic attack, unspecified: Secondary | ICD-10-CM | POA: Diagnosis not present

## 2016-01-10 DIAGNOSIS — E876 Hypokalemia: Secondary | ICD-10-CM | POA: Diagnosis not present

## 2016-01-10 DIAGNOSIS — R479 Unspecified speech disturbances: Secondary | ICD-10-CM | POA: Diagnosis not present

## 2016-01-10 DIAGNOSIS — R2681 Unsteadiness on feet: Secondary | ICD-10-CM | POA: Diagnosis not present

## 2016-01-12 DIAGNOSIS — R479 Unspecified speech disturbances: Secondary | ICD-10-CM | POA: Diagnosis not present

## 2016-01-12 DIAGNOSIS — M6281 Muscle weakness (generalized): Secondary | ICD-10-CM | POA: Diagnosis not present

## 2016-01-12 DIAGNOSIS — G459 Transient cerebral ischemic attack, unspecified: Secondary | ICD-10-CM | POA: Diagnosis not present

## 2016-01-12 DIAGNOSIS — R296 Repeated falls: Secondary | ICD-10-CM | POA: Diagnosis not present

## 2016-01-12 DIAGNOSIS — R2681 Unsteadiness on feet: Secondary | ICD-10-CM | POA: Diagnosis not present

## 2016-01-12 DIAGNOSIS — E876 Hypokalemia: Secondary | ICD-10-CM | POA: Diagnosis not present

## 2016-01-18 ENCOUNTER — Encounter: Payer: Medicare Other | Admitting: Internal Medicine

## 2016-01-19 DIAGNOSIS — R296 Repeated falls: Secondary | ICD-10-CM | POA: Diagnosis not present

## 2016-01-19 DIAGNOSIS — E876 Hypokalemia: Secondary | ICD-10-CM | POA: Diagnosis not present

## 2016-01-19 DIAGNOSIS — G459 Transient cerebral ischemic attack, unspecified: Secondary | ICD-10-CM | POA: Diagnosis not present

## 2016-01-19 DIAGNOSIS — M6281 Muscle weakness (generalized): Secondary | ICD-10-CM | POA: Diagnosis not present

## 2016-01-19 DIAGNOSIS — R2681 Unsteadiness on feet: Secondary | ICD-10-CM | POA: Diagnosis not present

## 2016-01-19 DIAGNOSIS — R479 Unspecified speech disturbances: Secondary | ICD-10-CM | POA: Diagnosis not present

## 2016-01-21 ENCOUNTER — Emergency Department (HOSPITAL_COMMUNITY): Payer: Medicare Other

## 2016-01-21 ENCOUNTER — Observation Stay (HOSPITAL_COMMUNITY)
Admission: EM | Admit: 2016-01-21 | Discharge: 2016-01-23 | Disposition: A | Discharge status: Skilled Nursing Facility | Payer: Medicare Other | Attending: Family Medicine | Admitting: Family Medicine

## 2016-01-21 ENCOUNTER — Telehealth: Payer: Self-pay

## 2016-01-21 DIAGNOSIS — F329 Major depressive disorder, single episode, unspecified: Secondary | ICD-10-CM | POA: Diagnosis not present

## 2016-01-21 DIAGNOSIS — H9190 Unspecified hearing loss, unspecified ear: Secondary | ICD-10-CM | POA: Insufficient documentation

## 2016-01-21 DIAGNOSIS — I700 Atherosclerosis of aorta: Secondary | ICD-10-CM | POA: Diagnosis not present

## 2016-01-21 DIAGNOSIS — Z66 Do not resuscitate: Secondary | ICD-10-CM | POA: Diagnosis not present

## 2016-01-21 DIAGNOSIS — R1310 Dysphagia, unspecified: Secondary | ICD-10-CM | POA: Insufficient documentation

## 2016-01-21 DIAGNOSIS — R479 Unspecified speech disturbances: Secondary | ICD-10-CM | POA: Diagnosis not present

## 2016-01-21 DIAGNOSIS — R42 Dizziness and giddiness: Secondary | ICD-10-CM | POA: Diagnosis not present

## 2016-01-21 DIAGNOSIS — L03116 Cellulitis of left lower limb: Secondary | ICD-10-CM | POA: Diagnosis not present

## 2016-01-21 DIAGNOSIS — Z888 Allergy status to other drugs, medicaments and biological substances status: Secondary | ICD-10-CM | POA: Insufficient documentation

## 2016-01-21 DIAGNOSIS — R404 Transient alteration of awareness: Secondary | ICD-10-CM | POA: Diagnosis not present

## 2016-01-21 DIAGNOSIS — R2681 Unsteadiness on feet: Secondary | ICD-10-CM | POA: Diagnosis not present

## 2016-01-21 DIAGNOSIS — G9340 Encephalopathy, unspecified: Secondary | ICD-10-CM | POA: Insufficient documentation

## 2016-01-21 DIAGNOSIS — R32 Unspecified urinary incontinence: Secondary | ICD-10-CM | POA: Diagnosis not present

## 2016-01-21 DIAGNOSIS — I119 Hypertensive heart disease without heart failure: Secondary | ICD-10-CM | POA: Diagnosis not present

## 2016-01-21 DIAGNOSIS — I4891 Unspecified atrial fibrillation: Secondary | ICD-10-CM | POA: Insufficient documentation

## 2016-01-21 DIAGNOSIS — F0390 Unspecified dementia without behavioral disturbance: Secondary | ICD-10-CM | POA: Diagnosis present

## 2016-01-21 DIAGNOSIS — L03115 Cellulitis of right lower limb: Secondary | ICD-10-CM | POA: Insufficient documentation

## 2016-01-21 DIAGNOSIS — Z885 Allergy status to narcotic agent status: Secondary | ICD-10-CM | POA: Insufficient documentation

## 2016-01-21 DIAGNOSIS — R55 Syncope and collapse: Principal | ICD-10-CM

## 2016-01-21 DIAGNOSIS — Z79899 Other long term (current) drug therapy: Secondary | ICD-10-CM | POA: Insufficient documentation

## 2016-01-21 DIAGNOSIS — Z9889 Other specified postprocedural states: Secondary | ICD-10-CM | POA: Insufficient documentation

## 2016-01-21 DIAGNOSIS — E039 Hypothyroidism, unspecified: Secondary | ICD-10-CM | POA: Diagnosis not present

## 2016-01-21 DIAGNOSIS — Z791 Long term (current) use of non-steroidal anti-inflammatories (NSAID): Secondary | ICD-10-CM | POA: Diagnosis not present

## 2016-01-21 DIAGNOSIS — R296 Repeated falls: Secondary | ICD-10-CM | POA: Diagnosis not present

## 2016-01-21 DIAGNOSIS — L0390 Cellulitis, unspecified: Secondary | ICD-10-CM

## 2016-01-21 DIAGNOSIS — E876 Hypokalemia: Secondary | ICD-10-CM | POA: Diagnosis not present

## 2016-01-21 DIAGNOSIS — E860 Dehydration: Secondary | ICD-10-CM | POA: Insufficient documentation

## 2016-01-21 DIAGNOSIS — F411 Generalized anxiety disorder: Secondary | ICD-10-CM | POA: Insufficient documentation

## 2016-01-21 DIAGNOSIS — I517 Cardiomegaly: Secondary | ICD-10-CM | POA: Insufficient documentation

## 2016-01-21 DIAGNOSIS — I10 Essential (primary) hypertension: Secondary | ICD-10-CM | POA: Diagnosis present

## 2016-01-21 DIAGNOSIS — G459 Transient cerebral ischemic attack, unspecified: Secondary | ICD-10-CM | POA: Diagnosis not present

## 2016-01-21 DIAGNOSIS — M6281 Muscle weakness (generalized): Secondary | ICD-10-CM | POA: Diagnosis not present

## 2016-01-21 DIAGNOSIS — Z8249 Family history of ischemic heart disease and other diseases of the circulatory system: Secondary | ICD-10-CM | POA: Diagnosis not present

## 2016-01-21 LAB — URINALYSIS, ROUTINE W REFLEX MICROSCOPIC
BILIRUBIN URINE: NEGATIVE
Glucose, UA: NEGATIVE mg/dL
HGB URINE DIPSTICK: NEGATIVE
Ketones, ur: NEGATIVE mg/dL
Leukocytes, UA: NEGATIVE
NITRITE: NEGATIVE
PROTEIN: NEGATIVE mg/dL
Specific Gravity, Urine: 1.011 (ref 1.005–1.030)
pH: 8 (ref 5.0–8.0)

## 2016-01-21 LAB — CBC WITH DIFFERENTIAL/PLATELET
BASOS ABS: 0 10*3/uL (ref 0.0–0.1)
BASOS PCT: 0 %
EOS ABS: 0.1 10*3/uL (ref 0.0–0.7)
EOS PCT: 1 %
HCT: 35.5 % — ABNORMAL LOW (ref 36.0–46.0)
Hemoglobin: 12 g/dL (ref 12.0–15.0)
Lymphocytes Relative: 11 %
Lymphs Abs: 1.1 10*3/uL (ref 0.7–4.0)
MCH: 33.2 pg (ref 26.0–34.0)
MCHC: 33.8 g/dL (ref 30.0–36.0)
MCV: 98.3 fL (ref 78.0–100.0)
MONO ABS: 1.1 10*3/uL — AB (ref 0.1–1.0)
MONOS PCT: 10 %
Neutro Abs: 8 10*3/uL — ABNORMAL HIGH (ref 1.7–7.7)
Neutrophils Relative %: 78 %
PLATELETS: 363 10*3/uL (ref 150–400)
RBC: 3.61 MIL/uL — ABNORMAL LOW (ref 3.87–5.11)
RDW: 14.3 % (ref 11.5–15.5)
WBC: 10.4 10*3/uL (ref 4.0–10.5)

## 2016-01-21 LAB — COMPREHENSIVE METABOLIC PANEL
ALBUMIN: 3.4 g/dL — AB (ref 3.5–5.0)
ALT: 16 U/L (ref 14–54)
ANION GAP: 10 (ref 5–15)
AST: 19 U/L (ref 15–41)
Alkaline Phosphatase: 62 U/L (ref 38–126)
BILIRUBIN TOTAL: 0.8 mg/dL (ref 0.3–1.2)
BUN: 27 mg/dL — ABNORMAL HIGH (ref 6–20)
CHLORIDE: 101 mmol/L (ref 101–111)
CO2: 27 mmol/L (ref 22–32)
Calcium: 9.3 mg/dL (ref 8.9–10.3)
Creatinine, Ser: 0.85 mg/dL (ref 0.44–1.00)
GFR calc Af Amer: >60 mL/min (ref >60)
GFR calc non Af Amer: 56 mL/min — ABNORMAL LOW (ref >60)
GLUCOSE: 102 mg/dL — AB (ref 65–99)
POTASSIUM: 4.3 mmol/L (ref 3.5–5.1)
SODIUM: 138 mmol/L (ref 135–145)
TOTAL PROTEIN: 6.4 g/dL — AB (ref 6.5–8.1)

## 2016-01-21 LAB — I-STAT TROPONIN, ED: Troponin i, poc: 0.01 ng/mL (ref 0.00–0.08)

## 2016-01-21 LAB — I-STAT CG4 LACTIC ACID, ED: LACTIC ACID, VENOUS: 1.35 mmol/L (ref 0.5–1.9)

## 2016-01-21 LAB — VITAMIN B12: Vitamin B-12: 338 pg/mL (ref 180–914)

## 2016-01-21 LAB — TROPONIN I
Troponin I: <0.03 ng/mL (ref <0.03)
Troponin I: <0.03 ng/mL (ref <0.03)

## 2016-01-21 LAB — MAGNESIUM: Magnesium: 1.9 mg/dL (ref 1.7–2.4)

## 2016-01-21 MED ORDER — ONDANSETRON HCL 4 MG PO TABS: 4.0000 mg | ORAL_TABLET | Freq: Four times a day (QID) | ORAL | Status: DC | PRN

## 2016-01-21 MED ORDER — DOXYCYCLINE HYCLATE 100 MG PO TABS
100.0000 mg | ORAL_TABLET | Freq: Two times a day (BID) | ORAL | Status: DC
  Administered 2016-01-21 – 2016-01-23 (×4): 100 mg via ORAL
  Filled 2016-01-21 (×4): qty 1

## 2016-01-21 MED ORDER — METOPROLOL SUCCINATE ER 25 MG PO TB24
12.5000 mg | ORAL_TABLET | Freq: Every day | ORAL | Status: DC
  Filled 2016-01-21: qty 1

## 2016-01-21 MED ORDER — ENOXAPARIN SODIUM 40 MG/0.4ML ~~LOC~~ SOLN
40.0000 mg | SUBCUTANEOUS | Status: DC
  Administered 2016-01-21 – 2016-01-22 (×2): 40 mg via SUBCUTANEOUS
  Filled 2016-01-21 (×2): qty 0.4

## 2016-01-21 MED ORDER — SILVER SULFADIAZINE 1 % EX CREA: 1.0000 "application " | TOPICAL_CREAM | Freq: Two times a day (BID) | CUTANEOUS | Status: DC

## 2016-01-21 MED ORDER — POLYETHYLENE GLYCOL 3350 17 G PO PACK
17.0000 g | PACK | Freq: Every day | ORAL | Status: DC
  Administered 2016-01-22 – 2016-01-23 (×2): 17 g via ORAL
  Filled 2016-01-21 (×2): qty 1

## 2016-01-21 MED ORDER — SODIUM CHLORIDE 0.9 % IV SOLN: INTRAVENOUS | Status: DC

## 2016-01-21 MED ORDER — ACETAMINOPHEN 650 MG RE SUPP: 650.0000 mg | Freq: Four times a day (QID) | RECTAL | Status: DC | PRN

## 2016-01-21 MED ORDER — LEVOTHYROXINE SODIUM 25 MCG PO TABS
25.0000 ug | ORAL_TABLET | Freq: Every day | ORAL | Status: DC
  Administered 2016-01-22 – 2016-01-23 (×2): 25 ug via ORAL
  Filled 2016-01-21 (×2): qty 1

## 2016-01-21 MED ORDER — ACETAMINOPHEN 325 MG PO TABS: 650.0000 mg | ORAL_TABLET | Freq: Four times a day (QID) | ORAL | Status: DC | PRN

## 2016-01-21 MED ORDER — ASPIRIN EC 81 MG PO TBEC
81.0000 mg | DELAYED_RELEASE_TABLET | Freq: Every day | ORAL | Status: DC
  Administered 2016-01-22 – 2016-01-23 (×2): 81 mg via ORAL
  Filled 2016-01-21 (×2): qty 1

## 2016-01-21 MED ORDER — SENNA 8.6 MG PO TABS
1.0000 | ORAL_TABLET | Freq: Two times a day (BID) | ORAL | Status: DC
  Administered 2016-01-21 – 2016-01-23 (×4): 8.6 mg via ORAL
  Filled 2016-01-21 (×4): qty 1

## 2016-01-21 MED ORDER — SODIUM CHLORIDE 0.9 % IV SOLN
INTRAVENOUS | Status: DC
  Administered 2016-01-21: 20:00:00 via INTRAVENOUS

## 2016-01-21 MED ORDER — ONDANSETRON HCL 4 MG/2ML IJ SOLN: 4.0000 mg | Freq: Four times a day (QID) | INTRAMUSCULAR | Status: DC | PRN

## 2016-01-21 MED ORDER — CITALOPRAM HYDROBROMIDE 20 MG PO TABS
10.0000 mg | ORAL_TABLET | Freq: Every day | ORAL | Status: DC
  Administered 2016-01-22 – 2016-01-23 (×2): 10 mg via ORAL
  Filled 2016-01-21 (×2): qty 1

## 2016-01-21 MED ORDER — RISPERIDONE 0.5 MG PO TABS
0.5000 mg | ORAL_TABLET | Freq: Every day | ORAL | Status: DC
  Administered 2016-01-21 – 2016-01-22 (×2): 0.5 mg via ORAL
  Filled 2016-01-21 (×3): qty 1

## 2016-01-21 MED ORDER — METOPROLOL SUCCINATE ER 25 MG PO TB24
12.5000 mg | ORAL_TABLET | Freq: Every day | ORAL | Status: DC
Start: 2016-01-22 — End: 2016-01-21

## 2016-01-21 NOTE — ED Notes (Addendum)
Pt transported to CT ?

## 2016-01-21 NOTE — H&P (Signed)
History and Physical    [name] DOB: 1917/10/12 DOA: 01/21/2016  PCP: Jeanmarie Hubert, MD  Patient coming from: ALF  Chief Complaint: lost of consciousness.   HPI: [name] is a 82 y.o. female with medical history significant of HTN, dysphagia, who presents after a syncope episode. Patient was able to work with PT today, them she went to bathroom and she pass out. Patient was unresponsive for few seconds. There was not any seizure like activities noticed, no bowel or urinary incontinance. Patient report feeling dizzy this morning. She denies dyspnea or chest pain prior to the episode. Family has notice mild confusion and decrease thought process for last week. Family also relates that at the facility they have been having difficult time waking up patient daily.   ED Course: Evaluation in the ED, orthostatic vitals negative, normal electrolytes, mild elevated BUN at 27, Normal LFT, hb at 12, wbc at 10.4, troponin 0.01, lactic acid 1.35, UA normal. CT head no acute abnormalities, chest x ray no active lung diseases.   Review of Systems: As per HPI otherwise 10 point review of systems negative.    Past Medical History:  Diagnosis Date  . Abnormality of gait 03/15/2010  . Anxiety state, unspecified 03/15/2010  . Depression, major 06/15/2015  . Depressive disorder, not elsewhere classified 03/15/2010  . Dysphagia, unspecified(787.20) 03/15/2010  . Generalized anxiety disorder 06/23/2014  . Herpes zoster without mention of complication [id number]  . Other alteration of consciousness 03/15/2010  . Pain in hand 08/13/12  . Personal history of fall 03/15/2010  . Senile osteoporosis 03/15/2010  . Unspecified constipation 03/15/2010  . Unspecified essential hypertension 03/15/2010  . Unspecified hearing loss 08/22/2011  . Unspecified urinary incontinence 03/15/2010  . Urinary incontinence 03/15/2010  . Vaginal prolapse 06/15/2015    Past Surgical History:  Procedure Laterality  Date  . INCISION / DRAINAGE HAND / FINGER Right 06/2008   hand  . SPINE SURGERY  12/2006     reports that she has never smoked. She has never used smokeless tobacco. She reports that she drinks alcohol. She reports that she does not use drugs.  Allergies  Allergen Reactions  . Ace Inhibitors Other (See Comments)    Reaction unknown/ listed on MAR  . Hydrochlorothiazide Other (See Comments)    Reaction unknown/ listed on MAR  . Morphine And Related Other (See Comments)    Hallucinations    Family History  Problem Relation Age of Onset  . Heart disease Father     MI     Prior to Admission medications   Medication Sig Start Date End Date Taking? Authorizing Provider  amLODipine (NORVASC) 10 MG tablet TAKE (1) TABLET DAILY FOR HIGH BLOOD PRESSURE. 12/02/14   Estill Dooms, MD  aspirin EC 81 MG tablet Take 1 tablet (81 mg total) by mouth daily. 12/11/14   Theodis Blaze, MD  Calcium Carbonate-Vitamin D (CALCIUM 600+D) 600-200 MG-UNIT TABS Take 1 tablet by mouth daily.     Historical Provider, MD  citalopram (CELEXA) 10 MG tablet One daily to help depression and anxiety 06/15/15   Estill Dooms, MD  furosemide (LASIX) 20 MG tablet Take 20 mg by mouth daily.    Historical Provider, MD  hydrocortisone (PROCTO-PAK) 1 % CREA Apply topically 2 (two) times daily as needed.    Historical Provider, MD  ibuprofen (ADVIL,MOTRIN) 200 MG tablet Take 400 mg by mouth every 6 (six) hours as needed (For headache, pain or fever.). Take one  as needed    Historical Provider, MD  levothyroxine (SYNTHROID, LEVOTHROID) 25 MCG tablet Take 25 mcg by mouth daily before breakfast.    Historical Provider, MD  losartan (COZAAR) 100 MG tablet TAKE (1) TABLET DAILY FOR HIGH BLOOD PRESSURE. 12/02/14   Estill Dooms, MD  metoprolol succinate (TOPROL-XL) 25 MG 24 hr tablet TAKE 1 TABLET ONCE DAILY TO CONTROL BLOOD PRESSURE. 12/02/14   Estill Dooms, MD  polyethylene glycol (MIRALAX / Floria Raveling) packet Take 17 g by mouth  daily as needed for moderate constipation.     Historical Provider, MD  risperiDONE (RISPERDAL) 0.5 MG tablet One at bedtime to prevent hallucinations and anxiety 06/22/15   Estill Dooms, MD  silver sulfADIAZINE (SILVADENE) 1 % cream Apply 1 application topically 2 (two) times daily. Apply to left shin until healed.    Historical Provider, MD    Physical Exam: Vitals:   01/21/16 1445 01/21/16 1500 01/21/16 1608 01/21/16 1700  BP: (!) 128/53 (!) 111/49 129/56 133/55  Pulse: (!) 56 (!) 56 60 (!) 59  Resp: 11 18 11 15   Temp:      SpO2: 96% 93% 93% 94%      Constitutional: NAD, calm, comfortable Vitals:   01/21/16 1445 01/21/16 1500 01/21/16 1608 01/21/16 1700  BP: (!) 128/53 (!) 111/49 129/56 133/55  Pulse: (!) 56 (!) 56 60 (!) 59  Resp: 11 18 11 15   Temp:      SpO2: 96% 93% 93% 94%   Eyes: PERRL, lids and conjunctivae normal ENMT: Mucous membranes are moist. Posterior pharynx clear of any exudate or lesions.Normal dentition.  Neck: normal, supple, no masses, no thyromegaly Respiratory: clear to auscultation bilaterally, no wheezing, no crackles. Normal respiratory effort. No accessory muscle use.  Cardiovascular: Regular rate and rhythm, no murmurs / rubs / gallops. No extremity edema. 2+ pedal pulses. No carotid bruits.  Abdomen: no tenderness, no masses palpated. No hepatosplenomegaly. Bowel sounds positive.  Musculoskeletal: no clubbing / cyanosis.  Good ROM, no contractures. Normal muscle tone. Deformity of hands , fingers.  Skin: bilateral lower extremities with redness , worse on the left LE, small open wound 1 cm , round.  Neurologic: CN 2-12 grossly intact. Sensation intact, DTR normal. Strength 5/5 in all 4.  Psychiatric: Alert and oriented x 3. Normal mood.     Labs on Admission: I have personally reviewed following labs and imaging studies  CBC:  Recent Labs Lab 01/21/16 1412  WBC 10.4  NEUTROABS 8.0*  HGB 12.0  HCT 35.5*  MCV 98.3  PLT [id number]   Basic  Metabolic Panel:  Recent Labs Lab 01/21/16 1412  NA 138  K 4.3  CL 101  CO2 27  GLUCOSE 102*  BUN 27*  CREATININE 0.85  CALCIUM 9.3   GFR: CrCl cannot be calculated (Unknown ideal weight.). Liver Function Tests:  Recent Labs Lab 01/21/16 1412  AST 19  ALT 16  ALKPHOS 62  BILITOT 0.8  PROT 6.4*  ALBUMIN 3.4*   No results for input(s): LIPASE, AMYLASE in the last 168 hours. No results for input(s): AMMONIA in the last 168 hours. Coagulation Profile: No results for input(s): INR, PROTIME in the last 168 hours. Cardiac Enzymes: No results for input(s): CKTOTAL, CKMB, CKMBINDEX, TROPONINI in the last 168 hours. BNP (last 3 results) No results for input(s): PROBNP in the last 8760 hours. HbA1C: No results for input(s): HGBA1C in the last 72 hours. CBG: No results for input(s): GLUCAP in the last 168 hours. Lipid  Profile: No results for input(s): CHOL, HDL, LDLCALC, TRIG, CHOLHDL, LDLDIRECT in the last 72 hours. Thyroid Function Tests: No results for input(s): TSH, T4TOTAL, FREET4, T3FREE, THYROIDAB in the last 72 hours. Anemia Panel: No results for input(s): VITAMINB12, FOLATE, FERRITIN, TIBC, IRON, RETICCTPCT in the last 72 hours. Urine analysis:    Component Value Date/Time   COLORURINE YELLOW 01/21/2016 1620   APPEARANCEUR CLOUDY (A) 01/21/2016 1620   LABSPEC 1.011 01/21/2016 1620   PHURINE 8.0 01/21/2016 1620   GLUCOSEU NEGATIVE 01/21/2016 1620   HGBUR NEGATIVE 01/21/2016 1620   BILIRUBINUR NEGATIVE 01/21/2016 1620   BILIRUBINUR negative 12/07/2014 1706   KETONESUR NEGATIVE 01/21/2016 1620   PROTEINUR NEGATIVE 01/21/2016 1620   UROBILINOGEN 1.0 12/07/2014 1706   UROBILINOGEN 0.2 01/04/2010 2342   NITRITE NEGATIVE 01/21/2016 1620   LEUKOCYTESUR NEGATIVE 01/21/2016 1620   Sepsis Labs: !!!!!!!!!!!!!!!!!!!!!!!!!!!!!!!!!!!!!!!!!!!! @LABRCNTIP (procalcitonin:4,lacticidven:4) )No results found for this or any previous visit (from the past 240 hour(s)).    Radiological Exams on Admission: Dg Chest 2 View  Result Date: 01/21/2016 CLINICAL DATA:  Syncopal episode today EXAM: CHEST  2 VIEW COMPARISON:  Chest x-ray of 12/10/2014 FINDINGS: No active infiltrate or effusion is seen. Moderate cardiomegaly is stable. The descending thoracic aorta is ectatic. Moderate thoracic aortic atherosclerosis is noted. The bones are diffusely osteopenic. Vertebroplasty in the lower thoracic or lumbar spine appears stable. IMPRESSION: 1. Stable moderate cardiomegaly.  No active lung disease. 2. Moderate thoracic aortic atherosclerosis. Electronically Signed   By: Ivar Drape M.D.   On: 01/21/2016 14:01   Ct Head Wo Contrast  Result Date: 01/21/2016 CLINICAL DATA:  Syncope EXAM: CT HEAD WITHOUT CONTRAST TECHNIQUE: Contiguous axial images were obtained from the base of the skull through the vertex without intravenous contrast. COMPARISON:  12/10/2014 FINDINGS: Brain: There is no evidence for acute hemorrhage, hydrocephalus, mass lesion, or abnormal extra-axial fluid collection. No definite CT evidence for acute infarction. Diffuse loss of parenchymal volume is consistent with atrophy. Patchy low attenuation in the deep hemispheric and periventricular white matter is nonspecific, but likely reflects chronic microvascular ischemic demyelination. Vascular: Atherosclerotic calcification is visualized in the carotid arteries. No dense MCA sign. Major dural sinuses are unremarkable. Skull: No evidence for fracture. No worrisome lytic or sclerotic lesion. Sinuses/Orbits: Opacification posterior right ethmoid air cells noted. Remaining visualized paranasal sinuses and mastoid air cells are clear. Visualized portions of the globes and intraorbital fat are unremarkable. Other: None. IMPRESSION: 1. Stable exam.  No acute intracranial abnormality. 2. Moderate atrophy with chronic small vessel white matter ischemic disease. Electronically Signed   By: Misty Stanley M.D.   On: 01/21/2016  15:32    EKG: initial EKG A fib, subsequent EKG NSR.   Assessment/Plan Principal Problem:   Syncope Active Problems:   Essential hypertension   Hypothyroidism   Senile dementia   Cellulitis  1-Syncope;  Admit to telemetry, to monitor for arrhythmia.  Initial EKG with A fib but subsequent EKG sinus. Monitor on telemetry.  Cycle cardiac enzymes. Check ECHO. Will hold BP medications, this might be factor. Might need to resume lower dose home BP medications in am.  Will continue with lower dose metoprolol, with holder parameter for bradycardia.  Check Mg level.   2-Acute encephalopathy, progressive confusion.  Will check B 12.  TSH.  Might be related to infection, cellulitis.   3-Cellulitis, bilateral Lower extremities.; start oral doxycycline.   4-HTN; hold lasix and Norvasc. Continue with metoprolol. Depending on BP in the morning could resume medications.   5-Hypothyroidism;  continue with synthroid.   6-history of paranoid, delusion; continue with risperidone.    DVT prophylaxis: Lovenox Code Status: DNR Family Communication: Care discussed with daughter and daughter in law.  Disposition Plan: Back to ALF  Consults called: none Admission status: Observation, telemetry    Niel Hummer A MD [facility] Pager [phone]  If 7PM-7AM, please contact night-coverage [email or web address] Password [facility]  01/21/2016, 5:43 PM

## 2016-01-21 NOTE — Telephone Encounter (Signed)
Spoke with nurse at [facility] who had sent resident out who had been unresponsive, family was made aware by nursing staff.

## 2016-01-21 NOTE — ED Provider Notes (Signed)
[place] DEPT Provider Note   CSN: [id number] Arrival date & time: 01/21/16  1253     History   Chief Complaint Chief Complaint  Patient presents with  . Loss of Consciousness    HPI [name] is a 82 y.o. female.  HPI [name] is a 82 y.o. female presents to emergency department after episode of syncope. Patient apparently just finished doing PT, and was in the bathroom with her therapist when she became dizzy and fainted. Patient was unresponsive for a few seconds. According to the family, patient with increased confusion and difficulty with thought process over the last several days. According to family this is very unusual for her. She does have history of acute delirium last year, but has been doing well since then. Patient does recall the episode of syncope and states she remembers feeling dizzy. She was caught by her therapist lowered to the ground. There was no seizure-like activity. Patient denies any complaints at this time. There has not been any recent illnesses.  Past Medical History:  Diagnosis Date  . Abnormality of gait 03/15/2010  . Anxiety state, unspecified 03/15/2010  . Depression, major 06/15/2015  . Depressive disorder, not elsewhere classified 03/15/2010  . Dysphagia, unspecified(787.20) 03/15/2010  . Generalized anxiety disorder 06/23/2014  . Herpes zoster without mention of complication [id number]  . Other alteration of consciousness 03/15/2010  . Pain in hand 08/13/12  . Personal history of fall 03/15/2010  . Senile osteoporosis 03/15/2010  . Unspecified constipation 03/15/2010  . Unspecified essential hypertension 03/15/2010  . Unspecified hearing loss 08/22/2011  . Unspecified urinary incontinence 03/15/2010  . Urinary incontinence 03/15/2010  . Vaginal prolapse 06/15/2015    Patient Active Problem List   Diagnosis Date Noted  . Senile dementia 12/31/2015  . Hypothyroidism 12/14/2015  . Injury of left shin 12/10/2015  . Rash 09/21/2015    . Urinary tract infection 09/14/2015  . Constipation 06/15/2015  . Urinary frequency 06/15/2015  . Depression, major 06/15/2015  . Vaginal prolapse 06/15/2015  . Edema 04/21/2015  . Hallucinations 12/22/2014  . Speech abnormality   . TIA (transient ischemic attack) 12/10/2014  . Hypokalemia 12/10/2014  . Generalized anxiety disorder 06/23/2014  . Hearing loss 06/03/2014  . Insomnia 12/16/2013  . Trigger finger, acquired 03/04/2013  . Pain in hand   . Essential hypertension 03/15/2010  . Urinary incontinence 03/15/2010  . Abnormality of gait 03/15/2010    Past Surgical History:  Procedure Laterality Date  . INCISION / DRAINAGE HAND / FINGER Right 06/2008   hand  . SPINE SURGERY  12/2006    OB History    No data available       Home Medications    Prior to Admission medications   Medication Sig Start Date End Date Taking? Authorizing Provider  amLODipine (NORVASC) 10 MG tablet TAKE (1) TABLET DAILY FOR HIGH BLOOD PRESSURE. 12/02/14   Estill Dooms, MD  aspirin EC 81 MG tablet Take 1 tablet (81 mg total) by mouth daily. 12/11/14   Theodis Blaze, MD  Calcium Carbonate-Vitamin D (CALCIUM 600+D) 600-200 MG-UNIT TABS Take 1 tablet by mouth daily.     Historical Provider, MD  citalopram (CELEXA) 10 MG tablet One daily to help depression and anxiety 06/15/15   Estill Dooms, MD  furosemide (LASIX) 20 MG tablet Take 20 mg by mouth daily.    Historical Provider, MD  hydrocortisone (PROCTO-PAK) 1 % CREA Apply topically 2 (two) times daily as needed.    Historical  Provider, MD  ibuprofen (ADVIL,MOTRIN) 200 MG tablet Take 400 mg by mouth every 6 (six) hours as needed (For headache, pain or fever.). Take one as needed    Historical Provider, MD  levothyroxine (SYNTHROID, LEVOTHROID) 25 MCG tablet Take 25 mcg by mouth daily before breakfast.    Historical Provider, MD  losartan (COZAAR) 100 MG tablet TAKE (1) TABLET DAILY FOR HIGH BLOOD PRESSURE. 12/02/14   Estill Dooms, MD   metoprolol succinate (TOPROL-XL) 25 MG 24 hr tablet TAKE 1 TABLET ONCE DAILY TO CONTROL BLOOD PRESSURE. 12/02/14   Estill Dooms, MD  polyethylene glycol (MIRALAX / Floria Raveling) packet Take 17 g by mouth daily as needed for moderate constipation.     Historical Provider, MD  risperiDONE (RISPERDAL) 0.5 MG tablet One at bedtime to prevent hallucinations and anxiety 06/22/15   Estill Dooms, MD  silver sulfADIAZINE (SILVADENE) 1 % cream Apply 1 application topically 2 (two) times daily. Apply to left shin until healed.    Historical Provider, MD    Family History Family History  Problem Relation Age of Onset  . Heart disease Father     MI    Social History Social History  Substance Use Topics  . Smoking status: Never Smoker  . Smokeless tobacco: Never Used  . Alcohol use Yes     Comment: 2-4 oz of wine once daily at request     Allergies   Ace inhibitors; Hydrochlorothiazide; and Morphine and related   Review of Systems Review of Systems  Constitutional: Negative for chills and fever.  Respiratory: Negative for cough, chest tightness and shortness of breath.   Cardiovascular: Negative for chest pain, palpitations and leg swelling.  Gastrointestinal: Negative for abdominal pain, diarrhea, nausea and vomiting.  Genitourinary: Negative for dysuria, flank pain and pelvic pain.  Musculoskeletal: Negative for arthralgias, myalgias, neck pain and neck stiffness.  Skin: Negative for rash.  Neurological: Positive for dizziness, syncope and light-headedness. Negative for weakness and headaches.  All other systems reviewed and are negative.    Physical Exam Updated Vital Signs BP 129/56   Pulse 60   Temp 97.9 F (36.6 C)   Resp 11   SpO2 93%   Physical Exam  Constitutional: She is oriented to person, place, and time. She appears well-developed and well-nourished. No distress.  HENT:  Head: Normocephalic.  Eyes: Conjunctivae are normal.  Neck: Neck supple.  Cardiovascular:  Normal rate, regular rhythm and normal heart sounds.   Pulmonary/Chest: Effort normal and breath sounds normal. No respiratory distress. She has no wheezes. She has no rales.  Abdominal: Soft. Bowel sounds are normal. She exhibits no distension. There is no tenderness. There is no rebound.  Musculoskeletal: She exhibits edema.  Neurological: She is alert and oriented to person, place, and time. No cranial nerve deficit. Coordination normal.  5/5 and equal upper and lower extremity strength bilaterally. Equal grip strength bilaterally. Normal finger to nose and heel to shin. No pronator drift.   Skin: Skin is warm and dry. Capillary refill takes less than 2 seconds.  Psychiatric: She has a normal mood and affect. Her behavior is normal.  Nursing note and vitals reviewed.    ED Treatments / Results  Labs (all labs ordered are listed, but only abnormal results are displayed) Labs Reviewed  CBC WITH DIFFERENTIAL/PLATELET - Abnormal; Notable for the following:       Result Value   RBC 3.61 (*)    HCT 35.5 (*)    Neutro Abs 8.0 (*)  Monocytes Absolute 1.1 (*)    All other components within normal limits  COMPREHENSIVE METABOLIC PANEL - Abnormal; Notable for the following:    Glucose, Bld 102 (*)    BUN 27 (*)    Total Protein 6.4 (*)    Albumin 3.4 (*)    GFR calc non Af Amer 56 (*)    All other components within normal limits  URINALYSIS, ROUTINE W REFLEX MICROSCOPIC - Abnormal; Notable for the following:    APPearance CLOUDY (*)    All other components within normal limits  I-STAT TROPOININ, ED  I-STAT CG4 LACTIC ACID, ED  I-STAT CG4 LACTIC ACID, ED    EKG  EKG Interpretation  Date/Time:  Friday January 21 2016 16:07:30 EST Ventricular Rate:  78 PR Interval:    QRS Duration: 102 QT Interval:  501 QTC Calculation: 497 R Axis:   28 Text Interpretation:  Sinus rhythm Multiple premature complexes, vent & supraven Short PR interval Low voltage, extremity leads Borderline  prolonged QT interval No STEMI.  Confirmed by LONG MD, JOSHUA [phone]) on 01/21/2016 4:24:02 PM       Radiology Dg Chest 2 View  Result Date: 01/21/2016 CLINICAL DATA:  Syncopal episode today EXAM: CHEST  2 VIEW COMPARISON:  Chest x-ray of 12/10/2014 FINDINGS: No active infiltrate or effusion is seen. Moderate cardiomegaly is stable. The descending thoracic aorta is ectatic. Moderate thoracic aortic atherosclerosis is noted. The bones are diffusely osteopenic. Vertebroplasty in the lower thoracic or lumbar spine appears stable. IMPRESSION: 1. Stable moderate cardiomegaly.  No active lung disease. 2. Moderate thoracic aortic atherosclerosis. Electronically Signed   By: Ivar Drape M.D.   On: 01/21/2016 14:01   Ct Head Wo Contrast  Result Date: 01/21/2016 CLINICAL DATA:  Syncope EXAM: CT HEAD WITHOUT CONTRAST TECHNIQUE: Contiguous axial images were obtained from the base of the skull through the vertex without intravenous contrast. COMPARISON:  12/10/2014 FINDINGS: Brain: There is no evidence for acute hemorrhage, hydrocephalus, mass lesion, or abnormal extra-axial fluid collection. No definite CT evidence for acute infarction. Diffuse loss of parenchymal volume is consistent with atrophy. Patchy low attenuation in the deep hemispheric and periventricular white matter is nonspecific, but likely reflects chronic microvascular ischemic demyelination. Vascular: Atherosclerotic calcification is visualized in the carotid arteries. No dense MCA sign. Major dural sinuses are unremarkable. Skull: No evidence for fracture. No worrisome lytic or sclerotic lesion. Sinuses/Orbits: Opacification posterior right ethmoid air cells noted. Remaining visualized paranasal sinuses and mastoid air cells are clear. Visualized portions of the globes and intraorbital fat are unremarkable. Other: None. IMPRESSION: 1. Stable exam.  No acute intracranial abnormality. 2. Moderate atrophy with chronic small vessel white matter  ischemic disease. Electronically Signed   By: Misty Stanley M.D.   On: 01/21/2016 15:32    Procedures Procedures (including critical care time)  Medications Ordered in ED Medications - No data to display   Initial Impression / Assessment and Plan / ED Course  I have reviewed the triage vital signs and the nursing notes.  Pertinent labs & imaging results that were available during my care of the patient were reviewed by me and considered in my medical decision making (see chart for details).  Clinical Course     Patient in emergency department with increased altered mental status and syncopal episode today. Patient is currently alert, oriented, hard of hearing but able to hear with her hearing aid. Will check labs, CT head, urinalysis, will monitor.  Patient's EKG shows possible A. fib, repeat EKG  showing some irregular beats with sinus rhythm for part of it. Patient is not orthostatic. She is mildly bradycardic, she is on metoprolol. She continues to have no complaints and is asking for coffee. I discussed patient with Dr. Laverta Baltimore. Her urinalysis is negative, labs unremarkable. Given the history of increased altered mental status recently and syncopal episode, with some EKG abnormality, will bring in for observation.  Spoke with [facility], will admit.   Vitals:   01/21/16 1330 01/21/16 1445 01/21/16 1500 01/21/16 1608  BP: (!) 127/52 (!) 128/53 (!) 111/49 129/56  Pulse: (!) 51 (!) 56 (!) 56 60  Resp: 18 11 18 11   Temp:      SpO2: 93% 96% 93% 93%     Final Clinical Impressions(s) / ED Diagnoses   Final diagnoses:  Syncope, unspecified syncope type    New Prescriptions New Prescriptions   No medications on file     Jeannett Senior, PA-C 01/21/16 [place], MD 01/21/16 1845

## 2016-01-21 NOTE — Progress Notes (Signed)
Pt has arrived to 2w from [facility] ED. Telemetry box applied and CCMD notified. Pt has been oriented to room. Vitals are stable. Pt denies needs at this time. Will continue current plan of care.  Grant Fontana BSN, RN

## 2016-01-21 NOTE — Telephone Encounter (Signed)
Message left on clinical intake voicemail: Dr.Reed is covering for [facility], resident was sent to ER.  I called [facility] and informed Dorian to inform [facility] staff to contact Dr.Reed via cell phone

## 2016-01-21 NOTE — ED Notes (Signed)
Admitting at bedside 

## 2016-01-21 NOTE — ED Triage Notes (Signed)
Per EMS - pt from [facility]. Pt had PT today. After PT, pt was on toilet and started to feel dizzy. Per staff, pt had syncopal episode. Staff placed pt on floor, pt had no injuries and did not hit head. Pt became A&O x 4 shortly thereafter. No complaints at this time. 12-lead NSR. Hard of hearing, wears hearing aid

## 2016-01-22 ENCOUNTER — Observation Stay (HOSPITAL_BASED_OUTPATIENT_CLINIC_OR_DEPARTMENT_OTHER): Payer: Medicare Other

## 2016-01-22 DIAGNOSIS — R1310 Dysphagia, unspecified: Secondary | ICD-10-CM | POA: Diagnosis not present

## 2016-01-22 DIAGNOSIS — F0390 Unspecified dementia without behavioral disturbance: Secondary | ICD-10-CM | POA: Diagnosis not present

## 2016-01-22 DIAGNOSIS — F329 Major depressive disorder, single episode, unspecified: Secondary | ICD-10-CM | POA: Diagnosis not present

## 2016-01-22 DIAGNOSIS — I4891 Unspecified atrial fibrillation: Secondary | ICD-10-CM | POA: Diagnosis not present

## 2016-01-22 DIAGNOSIS — I517 Cardiomegaly: Secondary | ICD-10-CM | POA: Diagnosis not present

## 2016-01-22 DIAGNOSIS — E860 Dehydration: Secondary | ICD-10-CM | POA: Diagnosis not present

## 2016-01-22 DIAGNOSIS — R55 Syncope and collapse: Secondary | ICD-10-CM

## 2016-01-22 DIAGNOSIS — I119 Hypertensive heart disease without heart failure: Secondary | ICD-10-CM | POA: Diagnosis not present

## 2016-01-22 DIAGNOSIS — E039 Hypothyroidism, unspecified: Secondary | ICD-10-CM | POA: Diagnosis not present

## 2016-01-22 DIAGNOSIS — L03116 Cellulitis of left lower limb: Secondary | ICD-10-CM | POA: Diagnosis not present

## 2016-01-22 DIAGNOSIS — L03115 Cellulitis of right lower limb: Secondary | ICD-10-CM | POA: Diagnosis not present

## 2016-01-22 DIAGNOSIS — G9340 Encephalopathy, unspecified: Secondary | ICD-10-CM | POA: Diagnosis not present

## 2016-01-22 LAB — BASIC METABOLIC PANEL
ANION GAP: 7 (ref 5–15)
BUN: 25 mg/dL — AB (ref 6–20)
CALCIUM: 8.6 mg/dL — AB (ref 8.9–10.3)
CO2: 27 mmol/L (ref 22–32)
CREATININE: 0.96 mg/dL (ref 0.44–1.00)
Chloride: 103 mmol/L (ref 101–111)
GFR calc Af Amer: 56 mL/min — ABNORMAL LOW (ref >60)
GFR, EST NON AFRICAN AMERICAN: 48 mL/min — AB (ref >60)
GLUCOSE: 85 mg/dL (ref 65–99)
Potassium: 4.2 mmol/L (ref 3.5–5.1)
Sodium: 137 mmol/L (ref 135–145)

## 2016-01-22 LAB — CBC
HCT: 35.3 % — ABNORMAL LOW (ref 36.0–46.0)
Hemoglobin: 11.9 g/dL — ABNORMAL LOW (ref 12.0–15.0)
MCH: 33 pg (ref 26.0–34.0)
MCHC: 33.7 g/dL (ref 30.0–36.0)
MCV: 97.8 fL (ref 78.0–100.0)
PLATELETS: 327 10*3/uL (ref 150–400)
RBC: 3.61 MIL/uL — ABNORMAL LOW (ref 3.87–5.11)
RDW: 14.1 % (ref 11.5–15.5)
WBC: 7.5 10*3/uL (ref 4.0–10.5)

## 2016-01-22 LAB — TROPONIN I

## 2016-01-22 LAB — ECHOCARDIOGRAM COMPLETE

## 2016-01-22 LAB — TSH: TSH: 3.135 u[IU]/mL (ref 0.350–4.500)

## 2016-01-22 NOTE — Progress Notes (Signed)
  Echocardiogram 2D Echocardiogram has been performed.  Donata Clay 01/22/2016, 1:48 PM

## 2016-01-22 NOTE — Evaluation (Addendum)
Physical Therapy Evaluation Patient Details Name: [name] MRN: [id number] DOB: [birth year] Today's Date: 01/22/2016   History of Present Illness  [name] is a 82 y.o. female with medical history significant of HTN, dysphagia, who presents after a syncope episode. Patient was able to work with PT today, them she went to bathroom and she pass out. Patient was unresponsive for few seconds  Clinical Impression  Pt with significant confusion, HOH, impaired safety awareness, and increased falls risk. Pt with 2 episodes of urinary incontinence. Pt requires 24//7 assist at this time. Pt would benefit from a memory care unit.    Follow Up Recommendations Home health PT;Supervision/Assistance - 24 hour    Equipment Recommendations  None recommended by PT    Recommendations for Other Services       Precautions / Restrictions Precautions Precautions: Fall Precaution Comments: confusion Restrictions Weight Bearing Restrictions: No      Mobility  Bed Mobility Overal bed mobility: Needs Assistance Bed Mobility: Supine to Sit;Rolling Rolling: Supervision   Supine to sit: Min guard     General bed mobility comments: min guard for safety, increased time  Transfers Overall transfer level: Needs assistance Equipment used: Rolling walker (2 wheeled) Transfers: Sit to/from Stand Sit to Stand: Min guard         General transfer comment: min guard for safety due to impulsivity and decreased safety awarenss  Ambulation/Gait Ambulation/Gait assistance: Min guard Ambulation Distance (Feet): 150 Feet Assistive device: Rolling walker (2 wheeled) Gait Pattern/deviations: Step-through pattern;Decreased stride length;Trunk flexed Gait velocity: slow   General Gait Details: pt impulsively quick, require min guard for safety, v/c's to stay in walker and not push to far our in front, minA around obstacles  Stairs Stairs: Yes          Wheelchair Mobility    Modified  Rankin (Stroke Patients Only)       Balance Overall balance assessment: Needs assistance         Standing balance support: Bilateral upper extremity supported Standing balance-Leahy Scale: Poor Standing balance comment: needs to hold onto something for hygiene                             Pertinent Vitals/Pain Pain Assessment: No/denies pain    Home Living Family/patient expects to be discharged to:: Assisted living               Home Equipment: Walker - 2 wheels Additional Comments: pt unable to state which ALF she stays at, [facility] [place] [facility].     Prior Function Level of Independence: Needs assistance   Gait / Transfers Assistance Needed: uses RW  ADL's / Homemaking Assistance Needed: per patient she dress herself and the staff helps with showering on "bath days" unsure of accuracy        Hand Dominance        Extremity/Trunk Assessment   Upper Extremity Assessment Upper Extremity Assessment: Generalized weakness    Lower Extremity Assessment Lower Extremity Assessment: Generalized weakness    Cervical / Trunk Assessment Cervical / Trunk Assessment: Kyphotic  Communication   Communication: No difficulties  Cognition Arousal/Alertness: Awake/alert Behavior During Therapy: WFL for tasks assessed/performed Overall Cognitive Status: Impaired/Different from baseline Area of Impairment: Memory;Attention;Orientation;Safety/judgement;Problem solving Orientation Level: Disoriented to;Place;Time;Situation Current Attention Level: Sustained Memory: Decreased short-term memory   Safety/Judgement: Decreased awareness of safety;Decreased awareness of deficits   Problem Solving: Slow processing;Difficulty sequencing;Requires verbal cues;Requires tactile  cues General Comments: pt reported she lived in [place] and then corrected self, denies ever working with PT PTA, however chart reports she was before her syncopal episode, pt with  urinary incontinence and was in the middle of a transfer and said "i'm peeing on the floor"    General Comments General comments (skin integrity, edema, etc.): bilat LE edema and cellulitis    Exercises     Assessment/Plan    PT Assessment Patient needs continued PT services  PT Problem List Decreased strength;Decreased activity tolerance;Decreased balance;Decreased mobility;Decreased cognition          PT Treatment Interventions DME instruction;Gait training;Functional mobility training;Therapeutic activities;Therapeutic exercise;Balance training;Cognitive remediation    PT Goals (Current goals can be found in the Care Plan section)  Acute Rehab PT Goals Patient Stated Goal: didn't state PT Goal Formulation: With patient Time For Goal Achievement: 01/29/16 Potential to Achieve Goals: Good    Frequency Min 2X/week   Barriers to discharge Decreased caregiver support needs 24/7 assist    Co-evaluation               End of Session Equipment Utilized During Treatment: Gait belt Activity Tolerance: Patient tolerated treatment well Patient left: in chair;with call bell/phone within reach;with chair alarm set Nurse Communication: Mobility status (confusion)    Functional Assessment Tool Used: clinical judgement Functional Limitation: Mobility: Walking and moving around Mobility: Walking and Moving Around Current Status [phone]): At least 20 percent but less than 40 percent impaired, limited or restricted Mobility: Walking and Moving Around Goal Status [phone]): At least 1 percent but less than 20 percent impaired, limited or restricted    Time: 1440-1511 PT Time Calculation (min) (ACUTE ONLY): 31 min   Charges:   PT Evaluation $PT Eval Moderate Complexity: 1 Procedure PT Treatments $Gait Training: 8-22 mins   PT G Codes:   PT G-Codes **NOT FOR INPATIENT CLASS** Functional Assessment Tool Used: clinical judgement Functional Limitation: Mobility: Walking and moving  around Mobility: Walking and Moving Around Current Status [id number]): At least 20 percent but less than 40 percent impaired, limited or restricted Mobility: Walking and Moving Around Goal Status [phone]): At least 1 percent but less than 20 percent impaired, limited or restricted    [name] 01/22/2016, 4:02 PM   Kittie Plater, PT, DPT Pager #: [phone] Office #: [phone]

## 2016-01-22 NOTE — Progress Notes (Signed)
[facility] Progress Note  Patient: [name] [id number]   PCP: Jeanmarie Hubert, MD DOB: [birth year]   DOA: 01/21/2016   DOS: 01/22/2016   Date of Service: the patient was seen and examined on 01/22/2016  Brief hospital course: Pt. with PMH of HTN, hypothyroidism; admitted on 01/21/2016, with complaint of passing out episode, was found to have sinus bradycardia and dehydration. Currently further plan is close monitoring.  Assessment and Plan: 1-Syncope;  Minimal sinus bradycardia on telemetry, no other acute arrhythmias. Initial EKG with A fib but subsequent EKG sinus. Monitor on telemetry.  ECHO shows no significant wall motion abnormalities or valvular disease. She remains to have some bradycardia on telemetry and therefore I would also discontinue metoprolol. His continue rest of the antihypertensive medication.  2-Acute encephalopathy, progressive confusion.  Low normal B 12. Start replacing. TSH.   3-Cellulitis, bilateral Lower extremities.; start oral doxycycline.   4-HTN; hold lasix and Norvasc,  metoprolol.   5-Hypothyroidism; continue with synthroid.   6-history of paranoid, delusion; continue with risperidone.   Bowel regimen: last BM 01/21/2016 Diet: Cardiac diet DVT Prophylaxis: subcutaneous Heparin  Advance goals of care discussion: DNR/DNI  Family Communication: family was present at bedside, at the time of interview. The pt provided permission to discuss medical plan with the family. Opportunity was given to ask question and all questions were answered satisfactorily.   Disposition:  Discharge to back to ALF. Expected discharge date: 01/23/2016  Consultants: None Procedures: Echocardiogram  Antibiotics: Anti-infectives    Start     Dose/Rate Route Frequency Ordered Stop   01/21/16 1930  doxycycline (VIBRA-TABS) tablet 100 mg     100 mg Oral Every 12 hours 01/21/16 1738          Subjective: Feeling better, no nausea no  vomiting.  Objective: Physical Exam: Vitals:   01/21/16 2030 01/22/16 0500 01/22/16 1123 01/22/16 1406  BP: (!) 128/49 (!) 149/56  (!) 141/52  Pulse: (!) 57 (!) 54  60  Resp: 16 16  17   Temp: 97.7 F (36.5 C) 97.4 F (36.3 C)  98 F (36.7 C)  TempSrc: Oral Oral Oral Oral  SpO2: 92% 93%      Intake/Output Summary (Last 24 hours) at 01/22/16 1755 Last data filed at 01/21/16 2301  Gross per 24 hour  Intake              560 ml  Output                0 ml  Net              560 ml   There were no vitals filed for this visit.  General: Alert, Awake and Oriented to Time, Place and Person. Appear in mild distress, affect appropriate Eyes: PERRL, Conjunctiva normal ENT: Oral Mucosa clear moist. Neck: no JVD, no Abnormal Mass Or lumps Cardiovascular: S1 and S2 Present, no Murmur, Respiratory: Bilateral Air entry equal and Decreased, no use of accessory muscle, Clear to Auscultation, no Crackles, no wheezes Abdomen: Bowel Sound present, Soft and no tenderness Skin: no redness, no Rash, no induration Extremities: no Pedal edema, no calf tenderness Neurologic: Grossly no focal neuro deficit. Bilaterally Equal motor strength  Data Reviewed: CBC:  Recent Labs Lab 01/21/16 1412 01/22/16 0532  WBC 10.4 7.5  NEUTROABS 8.0*  --   HGB 12.0 11.9*  HCT 35.5* 35.3*  MCV 98.3 97.8  PLT 363 [id number]   Basic Metabolic Panel:  Recent Labs Lab 01/21/16 1412 01/21/16  1815 01/22/16 0532  NA 138  --  137  K 4.3  --  4.2  CL 101  --  103  CO2 27  --  27  GLUCOSE 102*  --  85  BUN 27*  --  25*  CREATININE 0.85  --  0.96  CALCIUM 9.3  --  8.6*  MG  --  1.9  --     Liver Function Tests:  Recent Labs Lab 01/21/16 1412  AST 19  ALT 16  ALKPHOS 62  BILITOT 0.8  PROT 6.4*  ALBUMIN 3.4*   No results for input(s): LIPASE, AMYLASE in the last 168 hours. No results for input(s): AMMONIA in the last 168 hours. Coagulation Profile: No results for input(s): INR, PROTIME in the last  168 hours. Cardiac Enzymes:  Recent Labs Lab 01/21/16 1815 01/21/16 2202 01/22/16 0532  TROPONINI <0.03 <0.03 <0.03   BNP (last 3 results) No results for input(s): PROBNP in the last 8760 hours.  CBG: No results for input(s): GLUCAP in the last 168 hours.  Studies: No results found.   Scheduled Meds: . aspirin EC  81 mg Oral Daily  . citalopram  10 mg Oral Daily  . doxycycline  100 mg Oral Q12H  . enoxaparin (LOVENOX) injection  40 mg Subcutaneous Q24H  . levothyroxine  25 mcg Oral QAC breakfast  . polyethylene glycol  17 g Oral Daily  . risperiDONE  0.5 mg Oral QHS  . senna  1 tablet Oral BID   Continuous Infusions: PRN Meds: acetaminophen **OR** acetaminophen, ondansetron **OR** ondansetron (ZOFRAN) IV  Time spent: 30 minutes  Author: Berle Mull, MD [facility] Pager: [phone] 01/22/2016 5:55 PM  If 7PM-7AM, please contact night-coverage at [email or web address], password [facility]

## 2016-01-23 DIAGNOSIS — I10 Essential (primary) hypertension: Secondary | ICD-10-CM

## 2016-01-23 DIAGNOSIS — I4891 Unspecified atrial fibrillation: Secondary | ICD-10-CM | POA: Diagnosis not present

## 2016-01-23 DIAGNOSIS — I517 Cardiomegaly: Secondary | ICD-10-CM | POA: Diagnosis not present

## 2016-01-23 DIAGNOSIS — L03116 Cellulitis of left lower limb: Secondary | ICD-10-CM | POA: Diagnosis not present

## 2016-01-23 DIAGNOSIS — E860 Dehydration: Secondary | ICD-10-CM | POA: Diagnosis not present

## 2016-01-23 DIAGNOSIS — R55 Syncope and collapse: Principal | ICD-10-CM

## 2016-01-23 DIAGNOSIS — F0390 Unspecified dementia without behavioral disturbance: Secondary | ICD-10-CM

## 2016-01-23 DIAGNOSIS — F329 Major depressive disorder, single episode, unspecified: Secondary | ICD-10-CM | POA: Diagnosis not present

## 2016-01-23 DIAGNOSIS — L03115 Cellulitis of right lower limb: Secondary | ICD-10-CM | POA: Diagnosis not present

## 2016-01-23 DIAGNOSIS — R1310 Dysphagia, unspecified: Secondary | ICD-10-CM | POA: Diagnosis not present

## 2016-01-23 DIAGNOSIS — L03119 Cellulitis of unspecified part of limb: Secondary | ICD-10-CM

## 2016-01-23 DIAGNOSIS — R531 Weakness: Secondary | ICD-10-CM | POA: Diagnosis not present

## 2016-01-23 DIAGNOSIS — I119 Hypertensive heart disease without heart failure: Secondary | ICD-10-CM | POA: Diagnosis not present

## 2016-01-23 DIAGNOSIS — E039 Hypothyroidism, unspecified: Secondary | ICD-10-CM | POA: Diagnosis not present

## 2016-01-23 DIAGNOSIS — G9340 Encephalopathy, unspecified: Secondary | ICD-10-CM | POA: Diagnosis not present

## 2016-01-23 MED ORDER — DOXYCYCLINE HYCLATE 100 MG PO TABS: 100.0000 mg | ORAL_TABLET | Freq: Two times a day (BID) | ORAL | 0 refills | Status: AC

## 2016-01-23 MED ORDER — POLYETHYLENE GLYCOL 3350 17 G PO PACK: 17.0000 g | PACK | Freq: Every day | ORAL | 0 refills | Status: AC

## 2016-01-23 NOTE — [place] FL2 (Signed)
[place] MEDICAID FL2 LEVEL OF CARE SCREENING TOOL     IDENTIFICATION  Patient Name: [name] Birthdate: 05-01-1917 Sex: female Admission Date (Current Location): 01/21/2016  [facility] and [place] Number:  Herbalist and Address:  [facility], [place] [address], [place], [place] [address]      Provider Number: [id number]  Attending Physician Name and Address:  Eber Jones, MD  Relative Name and Phone Number:  Hunt Oris Daughter   [phone] or [place], [place] Daughter [phone]     Current Level of Care: Hospital Recommended Level of Care: [place] Prior Approval Number:    Date Approved/Denied:   PASRR Number:    Discharge Plan: Domiciliary (Rest home) [facility])    Current Diagnoses: Patient Active Problem List   Diagnosis Date Noted  . Syncope 01/21/2016  . Cellulitis 01/21/2016  . Senile dementia 12/31/2015  . Hypothyroidism 12/14/2015  . Injury of left shin 12/10/2015  . Rash 09/21/2015  . Urinary tract infection 09/14/2015  . Constipation 06/15/2015  . Urinary frequency 06/15/2015  . Depression, major 06/15/2015  . Vaginal prolapse 06/15/2015  . Edema 04/21/2015  . Hallucinations 12/22/2014  . Speech abnormality   . TIA (transient ischemic attack) 12/10/2014  . Hypokalemia 12/10/2014  . Generalized anxiety disorder 06/23/2014  . Hearing loss 06/03/2014  . Insomnia 12/16/2013  . Trigger finger, acquired 03/04/2013  . Pain in hand   . Essential hypertension 03/15/2010  . Urinary incontinence 03/15/2010  . Abnormality of gait 03/15/2010    Orientation RESPIRATION BLADDER Height & Weight     Self, Situation  Normal Incontinent Weight:   Height:     BEHAVIORAL SYMPTOMS/MOOD NEUROLOGICAL BOWEL NUTRITION STATUS      Continent Diet  AMBULATORY STATUS COMMUNICATION OF NEEDS Skin   Supervision Verbally Normal                       Personal Care Assistance Level of  Assistance  Bathing, Feeding, Dressing Bathing Assistance: Limited assistance Feeding assistance: Independent Dressing Assistance: Limited assistance     Functional Limitations Info  Sight, Hearing, Speech Sight Info: Adequate Hearing Info: Adequate Speech Info: Adequate    SPECIAL CARE FACTORS FREQUENCY  PT (By licensed PT)     PT Frequency: Home Health PT 2x a week              Contractures Contractures Info: Not present    Additional Factors Info  Code Status Code Status Info: DNR             Current Medications (01/23/2016):  This is the current hospital active medication list Current Facility-Administered Medications  Medication Dose Route Frequency Provider Last Rate Last Dose  . acetaminophen (TYLENOL) tablet 650 mg  650 mg Oral Q6H PRN Belkys A Regalado, MD       Or  . acetaminophen (TYLENOL) suppository 650 mg  650 mg Rectal Q6H PRN Belkys A Regalado, MD      . aspirin EC tablet 81 mg  81 mg Oral Daily Belkys A Regalado, MD   81 mg at 01/23/16 0917  . citalopram (CELEXA) tablet 10 mg  10 mg Oral Daily Belkys A Regalado, MD   10 mg at 01/23/16 0916  . doxycycline (VIBRA-TABS) tablet 100 mg  100 mg Oral Q12H Belkys A Regalado, MD   100 mg at 01/23/16 0916  . enoxaparin (LOVENOX) injection 40 mg  40 mg Subcutaneous Q24H Elmarie Shiley, MD  40 mg at 01/22/16 2057  . levothyroxine (SYNTHROID, LEVOTHROID) tablet 25 mcg  25 mcg Oral QAC breakfast Belkys A Regalado, MD   25 mcg at 01/23/16 0606  . ondansetron (ZOFRAN) tablet 4 mg  4 mg Oral Q6H PRN Belkys A Regalado, MD       Or  . ondansetron (ZOFRAN) injection 4 mg  4 mg Intravenous Q6H PRN Belkys A Regalado, MD      . polyethylene glycol (MIRALAX / GLYCOLAX) packet 17 g  17 g Oral Daily Belkys A Regalado, MD   17 g at 01/23/16 0916  . risperiDONE (RISPERDAL) tablet 0.5 mg  0.5 mg Oral QHS Belkys A Regalado, MD   0.5 mg at 01/22/16 2100  . senna (SENOKOT) tablet 8.6 mg  1 tablet Oral BID Belkys A Regalado, MD    8.6 mg at 01/23/16 [id number]     Discharge Medications: Please see discharge summary for a list of discharge medications.  Relevant Imaging Results:  Relevant Lab Results:   Additional Information SSN [id number]  Ross Ludwig, [place]

## 2016-01-23 NOTE — Care Management Note (Signed)
Case Management Note  Patient Details  Name: JAQUEL LEISTER MRN: [id number] Date of Birth: 03-13-1917  Subjective/Objective:     Syncope, HTN                Action/Plan: Discharge Planning: AVS reviewed:  NCM spoke to dtr, [place], pt lives at [facility]. Pt is currently receiving PT at facility. She has RW also. NCM contacted Unit RN to follow with CSW for transport and dc back to ALF.   PCP Estill Dooms MD    Expected Discharge Date:   12/3/12017           Expected Discharge Plan:  Assisted Living / Rest Home  In-House Referral:  Clinical Social Work  Discharge planning Services  CM Consult  Post Acute Care Choice:  Home Health Choice offered to:  Adult Children  DME Arranged:  N/A DME Agency:  NA  HH Arranged:  NA HH Agency:  NA  Status of Service:  Completed, signed off  If discussed at [place] of Stay Meetings, dates discussed:    Additional Comments:  Erenest Rasher, RN 01/23/2016, 4:35 PM

## 2016-01-23 NOTE — Discharge Instructions (Signed)
Doxycycline tablets or capsules What is this medicine? DOXYCYCLINE (dox i SYE kleen) is a tetracycline antibiotic. It kills certain bacteria or stops their growth. It is used to treat many kinds of infections, like dental, skin, respiratory, and urinary tract infections. It also treats acne, Lyme disease, malaria, and certain sexually transmitted infections. This medicine may be used for other purposes; ask your health care provider or pharmacist if you have questions. COMMON BRAND NAME(S): Acticlate, Adoxa, Adoxa CK, Adoxa Pak, Adoxa TT, Alodox, Avidoxy, Doxal, Mondoxyne NL, Monodox, Morgidox 1x, Morgidox 1x Kit, Morgidox 2x, Morgidox 2x Kit, NutriDox, Ocudox, TARGADOX, Vibra-Tabs, Vibramycin What should I tell my health care provider before I take this medicine? They need to know if you have any of these conditions: -liver disease -long exposure to sunlight like working outdoors -stomach problems like colitis -an unusual or allergic reaction to doxycycline, tetracycline antibiotics, other medicines, foods, dyes, or preservatives -pregnant or trying to get pregnant -breast-feeding How should I use this medicine? Take this medicine by mouth with a full glass of water. Follow the directions on the prescription label. It is best to take this medicine without food, but if it upsets your stomach take it with food. Take your medicine at regular intervals. Do not take your medicine more often than directed. Take all of your medicine as directed even if you think you are better. Do not skip doses or stop your medicine early. Talk to your pediatrician regarding the use of this medicine in children. While this drug may be prescribed for selected conditions, precautions do apply. Overdosage: If you think you have taken too much of this medicine contact a poison control center or emergency room at once. NOTE: This medicine is only for you. Do not share this medicine with others. What if I miss a dose? If you  miss a dose, take it as soon as you can. If it is almost time for your next dose, take only that dose. Do not take double or extra doses. What may interact with this medicine? -antacids -barbiturates -birth control pills -bismuth subsalicylate -carbamazepine -methoxyflurane -other antibiotics -phenytoin -vitamins that contain iron -warfarin This list may not describe all possible interactions. Give your health care provider a list of all the medicines, herbs, non-prescription drugs, or dietary supplements you use. Also tell them if you smoke, drink alcohol, or use illegal drugs. Some items may interact with your medicine. What should I watch for while using this medicine? Tell your doctor or health care professional if your symptoms do not improve. Do not treat diarrhea with over the counter products. Contact your doctor if you have diarrhea that lasts more than 2 days or if it is severe and watery. Do not take this medicine just before going to bed. It may not dissolve properly when you lay down and can cause pain in your throat. Drink plenty of fluids while taking this medicine to also help reduce irritation in your throat. This medicine can make you more sensitive to the sun. Keep out of the sun. If you cannot avoid being in the sun, wear protective clothing and use sunscreen. Do not use sun lamps or tanning beds/booths. Birth control pills may not work properly while you are taking this medicine. Talk to your doctor about using an extra method of birth control. If you are being treated for a sexually transmitted infection, avoid sexual contact until you have finished your treatment. Your sexual partner may also need treatment. Avoid antacids, aluminum, calcium, magnesium, and   iron products for 4 hours before and 2 hours after taking a dose of this medicine. If you are using this medicine to prevent malaria, you should still protect yourself from contact with mosquitos. Stay in screened-in  areas, use mosquito nets, keep your body covered, and use an insect repellent. What side effects may I notice from receiving this medicine? Side effects that you should report to your doctor or health care professional as soon as possible: -allergic reactions like skin rash, itching or hives, swelling of the face, lips, or tongue -difficulty breathing -fever -itching in the rectal or genital area -pain on swallowing -redness, blistering, peeling or loosening of the skin, including inside the mouth -severe stomach pain or cramps -unusual bleeding or bruising -unusually weak or tired -yellowing of the eyes or skin Side effects that usually do not require medical attention (report to your doctor or health care professional if they continue or are bothersome): -diarrhea -loss of appetite -nausea, vomiting This list may not describe all possible side effects. Call your doctor for medical advice about side effects. You may report side effects to FDA at 1-800-FDA-1088. Where should I keep my medicine? Keep out of the reach of children. Store at room temperature, below 30 degrees C (86 degrees F). Protect from light. Keep container tightly closed. Throw away any unused medicine after the expiration date. Taking this medicine after the expiration date can make you seriously ill. NOTE: This sheet is a summary. It may not cover all possible information. If you have questions about this medicine, talk to your doctor, pharmacist, or health care provider.  2017 Elsevier/Gold Standard (2015-02-10 17:11:22) Cellulitis, Adult Introduction Cellulitis is a skin infection. The infected area is usually red and sore. This condition occurs most often in the arms and lower legs. It is very important to get treated for this condition. Follow these instructions at home:  Take over-the-counter and prescription medicines only as told by your doctor.  If you were prescribed an antibiotic medicine, take it as told  by your doctor. Do not stop taking the antibiotic even if you start to feel better.  Drink enough fluid to keep your pee (urine) clear or pale yellow.  Do not touch or rub the infected area.  Raise (elevate) the infected area above the level of your heart while you are sitting or lying down.  Place warm or cold wet cloths (warm or cold compresses) on the infected area. Do this as told by your doctor.  Keep all follow-up visits as told by your doctor. This is important. These visits let your doctor make sure your infection is not getting worse. Contact a doctor if:  You have a fever.  Your symptoms do not get better after 1-2 days of treatment.  Your bone or joint under the infected area starts to hurt after the skin has healed.  Your infection comes back. This can happen in the same area or another area.  You have a swollen bump in the infected area.  You have new symptoms.  You feel ill and also have muscle aches and pains. Get help right away if:  Your symptoms get worse.  You feel very sleepy.  You throw up (vomit) or have watery poop (diarrhea) for a long time.  There are red streaks coming from the infected area.  Your red area gets larger.  Your red area turns darker. This information is not intended to replace advice given to you by your health care provider. Make sure  you discuss any questions you have with your health care provider. Document Released: 06/28/2007 Document Revised: 06/17/2015 Document Reviewed: 11/18/2014  2017 Elsevier Syncope Introduction Syncope is when you lose temporarily pass out (faint). Signs that you may be about to pass out include:  Feeling dizzy or light-headed.  Feeling sick to your stomach (nauseous).  Seeing all white or all black.  Having cold, clammy skin. If you passed out, get help right away. Call your local emergency services (911 in the [place] Do not drive yourself to the hospital. Follow these instructions at  home: Pay attention to any changes in your symptoms. Take these actions to help with your condition:  Have someone stay with you until you feel stable.  Do not drive, use machinery, or play sports until your doctor says it is okay.  Keep all follow-up visits as told by your doctor. This is important.  If you start to feel like you might pass out, lie down right away and raise (elevate) your feet above the level of your heart. Breathe deeply and steadily. Wait until all of the symptoms are gone.  Drink enough fluid to keep your pee (urine) clear or pale yellow.  If you are taking blood pressure or heart medicine, get up slowly and spend many minutes getting ready to sit and then stand. This can help with dizziness.  Take over-the-counter and prescription medicines only as told by your doctor. Get help right away if:  You have a very bad headache.  You have unusual pain in your chest, tummy, or back.  You are bleeding from your mouth or rectum.  You have black or tarry poop (stool).  You have a very fast or uneven heartbeat (palpitations).  It hurts to breathe.  You pass out once or more than once.  You have jerky movements that you cannot control (seizure).  You are confused.  You have trouble walking.  You are very weak.  You have vision problems. These symptoms may be an emergency. Do not wait to see if the symptoms will go away. Get medical help right away. Call your local emergency services (911 in the [place] Do not drive yourself to the hospital.  This information is not intended to replace advice given to you by your health care provider. Make sure you discuss any questions you have with your health care provider. Document Released: 06/28/2007 Document Revised: 06/17/2015 Document Reviewed: 09/23/2014  2017 Elsevier

## 2016-01-23 NOTE — Discharge Summary (Signed)
Physician Discharge Summary  [name] [id number] DOB: 1917-02-27 DOA: 01/21/2016  PCP: Jeanmarie Hubert, MD  Admit date: 01/21/2016 Discharge date: 01/23/2016  Admitted From: ALF Disposition:   ALF  Recommendations for Outpatient Follow-up:  1. Follow up with PCP in 1-2 weeks 2. Check blood pressure 2x/ day 3. Can consider adding back amlodipine if blood pressure is elevated 4. Please obtain BMP/CBC in one week 5. Continue antibiotics as prescribed 6. Consider replacement of B12 outpatient  Home Health: Yes- PT/ OT/ RN/ Aide Equipment/Devices: Walker  Discharge Condition: Stable CODE STATUS: DNR Diet recommendation: Regular  Brief/Interim Summary: Pt. with PMH of HTN, hypothyroidism; admitted on 01/21/2016, with complaint of passing out episode, was found to have sinus bradycardia and dehydration. Patient was evaluated and underwent Echocardiogram (results below).  She was seen by PT who requested [facility] with PT and possible memory unit.  Discussed with patient on 12/31 and she is requesting to leave.  She does not want any interventions and would like to go back to ALF.  Discussed with patient's daughter who is agreeable with patient discharging back to previous facility.  Discharge Diagnoses:  Principal Problem:   Syncope Active Problems:   Essential hypertension   Hypothyroidism   Senile dementia   Cellulitis  Syncope;  Minimal sinus bradycardia on telemetry, no other acute arrhythmias. Initial EKG with A fib but subsequent EKG sinus. Monitored on telemetry 1 period of bradycardia with heart rate in 30s but patient states she would not want to pursue any interventions Discussed with cardiologist on call Dr. Marlou Porch and given patient's preference, age and comorbidities no further intervention needed ECHO shows no significant wall motion abnormalities or valvular disease. Monitor off all hypertensive medications   Acute encephalopathy, progressive confusion.  Low  normal B 12 Start replacing (can be continued outpatient) TSH wnl  Cellulitis, bilateral Lower extremities.;  start oral doxycycline Continue for 4 more days   HTN;  hold lasix and Norvasc and metoprolol.  If blood pressure increases can consider adding back amlodipine at small dose  Hypothyroidism; continue with synthroid.   History of paranoid, delusion; continue with risperidone.   Discharge Instructions  Discharge Instructions    Call MD for:  difficulty breathing, headache or visual disturbances    Complete by:  As directed    Call MD for:  extreme fatigue    Complete by:  As directed    Call MD for:  persistant dizziness or light-headedness    Complete by:  As directed    Call MD for:  persistant nausea and vomiting    Complete by:  As directed    Call MD for:  severe uncontrolled pain    Complete by:  As directed    Call MD for:  temperature >100.4    Complete by:  As directed    Diet general    Complete by:  As directed    Increase activity slowly    Complete by:  As directed    Walker     Complete by:  As directed      Allergies as of 01/23/2016      Reactions   Ace Inhibitors Other (See Comments)   Reaction unknown/ listed on MAR   Hydrochlorothiazide Other (See Comments)   Reaction unknown/ listed on MAR   Morphine And Related Other (See Comments)   Hallucinations      Medication List    STOP taking these medications   amLODipine 10 MG tablet Commonly known as:  NORVASC   furosemide 20 MG tablet Commonly known as:  LASIX   losartan 100 MG tablet Commonly known as:  COZAAR   metoprolol succinate 25 MG 24 hr tablet Commonly known as:  TOPROL-XL     TAKE these medications   aspirin EC 81 MG tablet Take 1 tablet (81 mg total) by mouth daily.   CALCIUM 600+D 600-200 MG-UNIT Tabs Generic drug:  Calcium Carbonate-Vitamin D Take 1 tablet by mouth daily.   citalopram 10 MG tablet Commonly known as:  CELEXA One daily to help  depression and anxiety What changed:  how much to take  how to take this  when to take this  additional instructions   doxycycline 100 MG tablet Commonly known as:  VIBRA-TABS Take 1 tablet (100 mg total) by mouth every 12 (twelve) hours.   levothyroxine 25 MCG tablet Commonly known as:  SYNTHROID, LEVOTHROID Take 25 mcg by mouth daily before breakfast.   polyethylene glycol packet Commonly known as:  MIRALAX / GLYCOLAX Take 17 g by mouth daily. Start taking on:  01/24/2016 What changed:  when to take this  reasons to take this   risperiDONE 0.5 MG tablet Commonly known as:  RISPERDAL One at bedtime to prevent hallucinations and anxiety What changed:  how much to take  how to take this  when to take this  additional instructions       Allergies  Allergen Reactions  . Ace Inhibitors Other (See Comments)    Reaction unknown/ listed on MAR  . Hydrochlorothiazide Other (See Comments)    Reaction unknown/ listed on MAR  . Morphine And Related Other (See Comments)    Hallucinations    Consultations:  PT/OT   Procedures/Studies: Dg Chest 2 View  Result Date: 01/21/2016 CLINICAL DATA:  Syncopal episode today EXAM: CHEST  2 VIEW COMPARISON:  Chest x-ray of 12/10/2014 FINDINGS: No active infiltrate or effusion is seen. Moderate cardiomegaly is stable. The descending thoracic aorta is ectatic. Moderate thoracic aortic atherosclerosis is noted. The bones are diffusely osteopenic. Vertebroplasty in the lower thoracic or lumbar spine appears stable. IMPRESSION: 1. Stable moderate cardiomegaly.  No active lung disease. 2. Moderate thoracic aortic atherosclerosis. Electronically Signed   By: Ivar Drape M.D.   On: 01/21/2016 14:01   Ct Head Wo Contrast  Result Date: 01/21/2016 CLINICAL DATA:  Syncope EXAM: CT HEAD WITHOUT CONTRAST TECHNIQUE: Contiguous axial images were obtained from the base of the skull through the vertex without intravenous contrast. COMPARISON:   12/10/2014 FINDINGS: Brain: There is no evidence for acute hemorrhage, hydrocephalus, mass lesion, or abnormal extra-axial fluid collection. No definite CT evidence for acute infarction. Diffuse loss of parenchymal volume is consistent with atrophy. Patchy low attenuation in the deep hemispheric and periventricular white matter is nonspecific, but likely reflects chronic microvascular ischemic demyelination. Vascular: Atherosclerotic calcification is visualized in the carotid arteries. No dense MCA sign. Major dural sinuses are unremarkable. Skull: No evidence for fracture. No worrisome lytic or sclerotic lesion. Sinuses/Orbits: Opacification posterior right ethmoid air cells noted. Remaining visualized paranasal sinuses and mastoid air cells are clear. Visualized portions of the globes and intraorbital fat are unremarkable. Other: None. IMPRESSION: 1. Stable exam.  No acute intracranial abnormality. 2. Moderate atrophy with chronic small vessel white matter ischemic disease. Electronically Signed   By: Misty Stanley M.D.   On: 01/21/2016 15:32   Echocardiogram: Left ventricle: The cavity size was normal. Wall thickness was normal. Systolic function was vigorous. The estimated ejection fraction was in the  range of 65% to 70%. Doppler parameters are consistent with abnormal left ventricular relaxation (grade 1 diastolic dysfunction). Left atrium: The atrium was moderately dilated.   Subjective: Patient asking repeatedly to leave. She states she does not want any aggressive interventions or any interventions at all concerning her heart.  Talked to patient's daughter who agrees that patient would like what she needs in terms of medication but does not want anything aggressive.  Patient does have round the clock nursing care at her ALF.  Instructions written to check BP twice a day.  Will continue to hold antihypertensives.  Discharge Exam: Vitals:   01/23/16 0552 01/23/16 1242  BP: (!) 150/50 (!) 135/53   Pulse: (!) 56 66  Resp: 18 17  Temp: 97.5 F (36.4 C) 98.2 F (36.8 C)   Vitals:   01/22/16 1406 01/22/16 1933 01/23/16 0552 01/23/16 1242  BP: (!) 141/52 (!) 137/48 (!) 150/50 (!) 135/53  Pulse: 60 60 (!) 56 66  Resp: 17 16 18 17   Temp: 98 F (36.7 C) 97.9 F (36.6 C) 97.5 F (36.4 C) 98.2 F (36.8 C)  TempSrc: Oral Oral Oral Oral  SpO2:  96% 98% 97%    General: Pt is alert, awake, not in acute distress Cardiovascular: bradycardic, S1/S2 +, no rubs, no gallops Respiratory: CTA bilaterally, no wheezing, no rhonchi Abdominal: Soft, NT, ND, bowel sounds + Extremities: minimal erythema of lower extremities bilaterally    The results of significant diagnostics from this hospitalization (including imaging, microbiology, ancillary and laboratory) are listed below for reference.     Microbiology: No results found for this or any previous visit (from the past 240 hour(s)).   Labs: BNP (last 3 results) No results for input(s): BNP in the last 8760 hours. Basic Metabolic Panel:  Recent Labs Lab 01/21/16 1412 01/21/16 1815 01/22/16 0532  NA 138  --  137  K 4.3  --  4.2  CL 101  --  103  CO2 27  --  27  GLUCOSE 102*  --  85  BUN 27*  --  25*  CREATININE 0.85  --  0.96  CALCIUM 9.3  --  8.6*  MG  --  1.9  --    Liver Function Tests:  Recent Labs Lab 01/21/16 1412  AST 19  ALT 16  ALKPHOS 62  BILITOT 0.8  PROT 6.4*  ALBUMIN 3.4*   No results for input(s): LIPASE, AMYLASE in the last 168 hours. No results for input(s): AMMONIA in the last 168 hours. CBC:  Recent Labs Lab 01/21/16 1412 01/22/16 0532  WBC 10.4 7.5  NEUTROABS 8.0*  --   HGB 12.0 11.9*  HCT 35.5* 35.3*  MCV 98.3 97.8  PLT 363 327   Cardiac Enzymes:  Recent Labs Lab 01/21/16 1815 01/21/16 2202 01/22/16 0532  TROPONINI <0.03 <0.03 <0.03   BNP: Invalid input(s): POCBNP CBG: No results for input(s): GLUCAP in the last 168 hours. D-Dimer No results for input(s): DDIMER in the  last 72 hours. Hgb A1c No results for input(s): HGBA1C in the last 72 hours. Lipid Profile No results for input(s): CHOL, HDL, LDLCALC, TRIG, CHOLHDL, LDLDIRECT in the last 72 hours. Thyroid function studies  Recent Labs  01/22/16 0758  TSH 3.135   Anemia work up  Recent Labs  01/21/16 1815  VITAMINB12 338   Urinalysis    Component Value Date/Time   COLORURINE YELLOW 01/21/2016 1620   APPEARANCEUR CLOUDY (A) 01/21/2016 1620   LABSPEC 1.011 01/21/2016 1620  PHURINE 8.0 01/21/2016 1620   GLUCOSEU NEGATIVE 01/21/2016 1620   HGBUR NEGATIVE 01/21/2016 1620   BILIRUBINUR NEGATIVE 01/21/2016 1620   BILIRUBINUR negative 12/07/2014 1706   KETONESUR NEGATIVE 01/21/2016 1620   PROTEINUR NEGATIVE 01/21/2016 1620   UROBILINOGEN 1.0 12/07/2014 1706   UROBILINOGEN 0.2 01/04/2010 2342   NITRITE NEGATIVE 01/21/2016 1620   LEUKOCYTESUR NEGATIVE 01/21/2016 1620   Sepsis Labs Invalid input(s): PROCALCITONIN,  WBC,  LACTICIDVEN Microbiology No results found for this or any previous visit (from the past 240 hour(s)).   Time coordinating discharge: Over 30 minutes  SIGNED:   Loretha Stapler, MD  [facility] 01/23/2016, 12:53 PM Pager [phone] If 7PM-7AM, please contact night-coverage [email or web address] Password [email or web address]

## 2016-01-25 ENCOUNTER — Non-Acute Institutional Stay: Payer: Medicare Other | Admitting: Nurse Practitioner

## 2016-01-25 ENCOUNTER — Encounter: Payer: Self-pay | Admitting: *Deleted

## 2016-01-25 DIAGNOSIS — K5900 Constipation, unspecified: Secondary | ICD-10-CM | POA: Diagnosis not present

## 2016-01-25 DIAGNOSIS — R609 Edema, unspecified: Secondary | ICD-10-CM | POA: Diagnosis not present

## 2016-01-25 DIAGNOSIS — F0390 Unspecified dementia without behavioral disturbance: Secondary | ICD-10-CM

## 2016-01-25 DIAGNOSIS — R55 Syncope and collapse: Secondary | ICD-10-CM | POA: Diagnosis not present

## 2016-01-25 DIAGNOSIS — L03119 Cellulitis of unspecified part of limb: Secondary | ICD-10-CM | POA: Diagnosis not present

## 2016-01-25 DIAGNOSIS — F321 Major depressive disorder, single episode, moderate: Secondary | ICD-10-CM

## 2016-01-25 DIAGNOSIS — E039 Hypothyroidism, unspecified: Secondary | ICD-10-CM | POA: Diagnosis not present

## 2016-01-25 DIAGNOSIS — R443 Hallucinations, unspecified: Secondary | ICD-10-CM | POA: Diagnosis not present

## 2016-01-25 DIAGNOSIS — I10 Essential (primary) hypertension: Secondary | ICD-10-CM

## 2016-01-25 NOTE — Assessment & Plan Note (Signed)
Stable, continue MiraLax daily prn.

## 2016-01-25 NOTE — Assessment & Plan Note (Signed)
12/12/15 mmse 20/30, continue AL

## 2016-01-25 NOTE — Assessment & Plan Note (Signed)
Blood pressure is controlled, off  Amlodipine 10mg  daily, Losartan 100mg  daily, Metoprolol 25mg  daily. Furosemide 20mg  daily.  Pending BMP

## 2016-01-25 NOTE — Progress Notes (Signed)
Location:  [place] Room Number: 8 Place of Service:  ALF [phone]) Provider:  Freddie Nghiem, Manxie  NP  Jeanmarie Hubert, MD  Patient Care Team: Estill Dooms, MD as PCP - General (Internal Medicine) [facility] Gaynelle Arabian, MD as Consulting Physician (Orthopedic Surgery) Keisa Blow Otho Darner, NP as Nurse Practitioner (Internal Medicine)  Extended Emergency Contact Information Primary Emergency Contact: Peri Jefferson [place] Mobile Phone: [phone] Relation: Daughter Secondary Emergency Contact: Kerby Less [place] Phone: [phone] Relation: Daughter  Code Status:  DNR Goals of care: Advanced Directive information Advanced Directives 01/25/2016  Does Patient Have a Medical Advance Directive? Yes  Type of Paramedic of [place];Out of facility DNR (pink MOST or yellow form);Living will  Does patient want to make changes to medical advance directive? No - Patient declined  Copy of [place] in Chart? Yes  Pre-existing out of facility DNR order (yellow form or pink MOST form) -     Chief Complaint  Patient presents with  . Acute Visit    ? syncope    HPI:  Pt is a 83 y.o. female seen today for an acute visit for f/u hospitalization 01/21/16 to 01/23/16 for syncope,  dehydration and bradycardia(heart rate in 30s, desires no further interventions), discharged per pt's request. Off all Bp meds. She was treated for BLE cellulitis with Doxycycline.   Hx of hallucination, insomnia, anxiety, stable while on Risperdal 0.5mg  qd, Celexa 10mg , HTN is controlled, off  Losartan, Metoprolol, and Amlodipine. Chronic edema BLE is minimal, off Furosemide 20mg . TSH 5.99 12/13/15, started Levothyroxine 73mcg 12/14/15, f/u TSH scheduled.   Past Medical History:  Diagnosis Date  . Abnormality of gait 03/15/2010  . Anxiety state, unspecified 03/15/2010  . Depression, major 06/15/2015  . Depressive  disorder, not elsewhere classified 03/15/2010  . Dysphagia, unspecified(787.20) 03/15/2010  . Generalized anxiety disorder 06/23/2014  . Herpes zoster without mention of complication [id number]  . Other alteration of consciousness 03/15/2010  . Pain in hand 08/13/12  . Personal history of fall 03/15/2010  . Senile osteoporosis 03/15/2010  . Unspecified constipation 03/15/2010  . Unspecified essential hypertension 03/15/2010  . Unspecified hearing loss 08/22/2011  . Unspecified urinary incontinence 03/15/2010  . Urinary incontinence 03/15/2010  . Vaginal prolapse 06/15/2015   Past Surgical History:  Procedure Laterality Date  . INCISION / DRAINAGE HAND / FINGER Right 06/2008   hand  . SPINE SURGERY  12/2006    Allergies  Allergen Reactions  . Ace Inhibitors Other (See Comments)    Reaction unknown/ listed on MAR  . Hydrochlorothiazide Other (See Comments)    Reaction unknown/ listed on MAR  . Morphine And Related Other (See Comments)    Hallucinations    Allergies as of 01/25/2016      Reactions   Ace Inhibitors Other (See Comments)   Reaction unknown/ listed on MAR   Hydrochlorothiazide Other (See Comments)   Reaction unknown/ listed on MAR   Morphine And Related Other (See Comments)   Hallucinations      Medication List       Accurate as of 01/25/16  3:22 PM. Always use your most recent med list.          aspirin EC 81 MG tablet Take 1 tablet (81 mg total) by mouth daily.   CALCIUM 600+D 600-200 MG-UNIT Tabs Generic drug:  Calcium Carbonate-Vitamin D Take 1 tablet by mouth daily.   citalopram 10 MG tablet  Commonly known as:  CELEXA One daily to help depression and anxiety   doxycycline 100 MG tablet Commonly known as:  VIBRA-TABS Take 1 tablet (100 mg total) by mouth every 12 (twelve) hours.   Ibuprofen 200 MG Caps Take 2 capsules by mouth every 6 (six) hours as needed.   levothyroxine 25 MCG tablet Commonly known as:  SYNTHROID, LEVOTHROID Take 25 mcg by mouth  daily before breakfast.   polyethylene glycol packet Commonly known as:  MIRALAX / GLYCOLAX Take 17 g by mouth daily.   risperiDONE 0.5 MG tablet Commonly known as:  RISPERDAL One at bedtime to prevent hallucinations and anxiety       Review of Systems  Constitutional: Negative for activity change, appetite change and fatigue.  HENT: Positive for hearing loss. Negative for ear pain.   Eyes: Negative.   Respiratory: Negative.  Negative for shortness of breath.   Cardiovascular: Positive for leg swelling. Negative for chest pain and palpitations.       HTN  Gastrointestinal: Negative.   Endocrine: Negative.   Genitourinary:       Incontinence  Musculoskeletal: Positive for arthralgias (in feet).       Pain in both hands: R>L.  Skin: Negative for rash (Erythematous rash of the lower legs nonspecific nature).  Neurological: Negative.   Hematological: Negative.   Psychiatric/Behavioral: Positive for confusion and sleep disturbance (Wakes at 3 AM. Full of energy then. Uses Motrin PM to get back to sleep. ). The patient is nervous/anxious.        Hx of hallucinations.    Immunization History  Administered Date(s) Administered  . Influenza Whole 10/24/2011, 10/24/2012  . Influenza-Unspecified 11/06/2013, 11/04/2015  . PPD Test 01/11/2015  . Pneumococcal Polysaccharide-23 01/23/2009  . Td 01/23/2009   Pertinent  Health Maintenance Due  Topic Date Due  . DEXA SCAN  07/26/1984  . PNA vac Low Risk Adult (2 of 2 - PCV13) 01/23/2010  . INFLUENZA VACCINE  Completed   Fall Risk  09/21/2015 06/15/2015 01/05/2015 12/29/2014 12/29/2014  Falls in the past year? No No No No No  Number falls in past yr: - - - - -  Injury with Fall? - - - - -   Functional Status Survey:    Vitals:   01/25/16 1217  BP: (!) 160/78  Pulse: 63  Resp: 20  Temp: 97 F (36.1 C)  SpO2: 96%  Weight: 124 lb (56.2 kg)  Height: 5\' 2"  (1.575 m)   Body mass index is 22.68 kg/m. Physical Exam    Constitutional: She appears well-developed and well-nourished. No distress.  HENT:  Head: Normocephalic and atraumatic.  Severe hearing loss bilaterally. Wearing hearing aids.  Eyes: Conjunctivae and EOM are normal. Pupils are equal, round, and reactive to light.  Neck: Normal range of motion. Neck supple. No JVD present. No tracheal deviation present. No thyromegaly present.  Cardiovascular: Normal rate, regular rhythm and normal heart sounds.  Exam reveals no gallop and no friction rub.   No murmur heard. Pulmonary/Chest: No respiratory distress. She has no wheezes. She has no rales. She exhibits no tenderness.  Abdominal: Bowel sounds are normal. She exhibits no distension and no mass. There is no tenderness.  Genitourinary: Rectal exam shows guaiac negative stool.  Genitourinary Comments: Examined 06/15/15: Firm area at the top of the vagina. Prolapse of the vaginal walls. Loss of rectal sphincter tone.  Musculoskeletal: Normal range of motion. She exhibits edema. She exhibits no tenderness.  Pain in hands. Right 3rd trigger  finger. Uses walker due to unstable gait. Left 2nd toe is deviated to the great toe and has elevated DIP joint that rubs on her shoe. Trace edema R+L ankles  Lymphadenopathy:    She has no cervical adenopathy.  Neurological: She is alert. No cranial nerve deficit. Coordination normal.  12/29/14 MMSE 26/30 . Failed clock drawing.  Skin: No rash (Nonspecific erythematous rash in both lower legs.) noted. No erythema. No pallor.  Left shin open wound, mild erythema and warmth peri wound.   Psychiatric: She has a normal mood and affect. Her behavior is normal.    Labs reviewed:  Recent Labs  11/10/15 1452 01/21/16 1412 01/21/16 1815 01/22/16 0532  NA 138 138  --  137  K 4.0 4.3  --  4.2  CL 100* 101  --  103  CO2 30 27  --  27  GLUCOSE 105* 102*  --  85  BUN 20 27*  --  25*  CREATININE 0.70 0.85  --  0.96  CALCIUM 8.8* 9.3  --  8.6*  MG  --   --  1.9  --      Recent Labs  01/21/16 1412  AST 19  ALT 16  ALKPHOS 62  BILITOT 0.8  PROT 6.4*  ALBUMIN 3.4*    Recent Labs  11/10/15 1452 01/21/16 1412 01/22/16 0532  WBC 8.1 10.4 7.5  NEUTROABS 5.8 8.0*  --   HGB 13.1 12.0 11.9*  HCT 38.5 35.5* 35.3*  MCV 98.2 98.3 97.8  PLT 343 363 327   Lab Results  Component Value Date   TSH 3.135 01/22/2016   Lab Results  Component Value Date   HGBA1C 5.6 12/11/2014   Lab Results  Component Value Date   CHOL 178 12/11/2014   HDL 60 12/11/2014   LDLCALC 97 12/11/2014   TRIG 106 12/11/2014   CHOLHDL 3.0 12/11/2014    Significant Diagnostic Results in last 30 days:  Dg Chest 2 View  Result Date: 01/21/2016 CLINICAL DATA:  Syncopal episode today EXAM: CHEST  2 VIEW COMPARISON:  Chest x-ray of 12/10/2014 FINDINGS: No active infiltrate or effusion is seen. Moderate cardiomegaly is stable. The descending thoracic aorta is ectatic. Moderate thoracic aortic atherosclerosis is noted. The bones are diffusely osteopenic. Vertebroplasty in the lower thoracic or lumbar spine appears stable. IMPRESSION: 1. Stable moderate cardiomegaly.  No active lung disease. 2. Moderate thoracic aortic atherosclerosis. Electronically Signed   By: Ivar Drape M.D.   On: 01/21/2016 14:01   Ct Head Wo Contrast  Result Date: 01/21/2016 CLINICAL DATA:  Syncope EXAM: CT HEAD WITHOUT CONTRAST TECHNIQUE: Contiguous axial images were obtained from the base of the skull through the vertex without intravenous contrast. COMPARISON:  12/10/2014 FINDINGS: Brain: There is no evidence for acute hemorrhage, hydrocephalus, mass lesion, or abnormal extra-axial fluid collection. No definite CT evidence for acute infarction. Diffuse loss of parenchymal volume is consistent with atrophy. Patchy low attenuation in the deep hemispheric and periventricular white matter is nonspecific, but likely reflects chronic microvascular ischemic demyelination. Vascular: Atherosclerotic calcification is  visualized in the carotid arteries. No dense MCA sign. Major dural sinuses are unremarkable. Skull: No evidence for fracture. No worrisome lytic or sclerotic lesion. Sinuses/Orbits: Opacification posterior right ethmoid air cells noted. Remaining visualized paranasal sinuses and mastoid air cells are clear. Visualized portions of the globes and intraorbital fat are unremarkable. Other: None. IMPRESSION: 1. Stable exam.  No acute intracranial abnormality. 2. Moderate atrophy with chronic small vessel white matter ischemic disease.  Electronically Signed   By: Misty Stanley M.D.   On: 01/21/2016 15:32    Assessment/Plan Essential hypertension Blood pressure is controlled, off  Amlodipine 10mg  daily, Losartan 100mg  daily, Metoprolol 25mg  daily. Furosemide 20mg  daily.  Pending BMP    Syncope Resolved. Dehydration, bradycardia, cellulitis BLE contributory.   Constipation Stable, continue MiraLax daily prn.   Hypothyroidism 12/13/15 wbc 9.1, Hgb 12.6, plt 413, Na 135, K 3.7, Bun 18, creat 0.80, TSH 5.99, MMSE 26/30 12/14/15 Levothyroxine 45mcg daily, TSH 12 weeks.    Senile dementia 12/12/15 mmse 20/30, continue AL  Hallucinations persisted confusion, continue Risperdal 0.5mg  daily    Edema Improved, off Furosemide 20mg  daily.   Depression, major Her mood is stable, continue Risperdal 0.5mg ,  Celexa 10mg     Cellulitis BLE, complete 4 more days Doxy at [facility] since 01/23/16     Family/ staff Communication: AL  Labs/tests ordered:  Pending CBC BMP

## 2016-01-25 NOTE — Assessment & Plan Note (Signed)
Her mood is stable, continue Risperdal 0.5mg,  Celexa 10mg.  

## 2016-01-25 NOTE — Assessment & Plan Note (Signed)
12/13/15 wbc 9.1, Hgb 12.6, plt 413, Na 135, K 3.7, Bun 18, creat 0.80, TSH 5.99, MMSE 26/30 12/14/15 Levothyroxine 25mcg daily, TSH 12 weeks.

## 2016-01-25 NOTE — Assessment & Plan Note (Signed)
Improved, off Furosemide 20mg  daily.

## 2016-01-25 NOTE — Assessment & Plan Note (Signed)
Resolved. Dehydration, bradycardia, cellulitis BLE contributory.

## 2016-01-25 NOTE — Assessment & Plan Note (Signed)
persisted confusion, continue Risperdal 0.5mg  daily

## 2016-01-25 NOTE — Assessment & Plan Note (Signed)
BLE, complete 4 more days Doxy at [facility] since 01/23/16

## 2016-01-26 DIAGNOSIS — G459 Transient cerebral ischemic attack, unspecified: Secondary | ICD-10-CM | POA: Diagnosis not present

## 2016-01-26 DIAGNOSIS — I10 Essential (primary) hypertension: Secondary | ICD-10-CM | POA: Diagnosis not present

## 2016-01-26 DIAGNOSIS — R269 Unspecified abnormalities of gait and mobility: Secondary | ICD-10-CM | POA: Diagnosis not present

## 2016-01-26 DIAGNOSIS — H9190 Unspecified hearing loss, unspecified ear: Secondary | ICD-10-CM | POA: Diagnosis not present

## 2016-01-26 DIAGNOSIS — M79643 Pain in unspecified hand: Secondary | ICD-10-CM | POA: Diagnosis not present

## 2016-01-26 DIAGNOSIS — R32 Unspecified urinary incontinence: Secondary | ICD-10-CM | POA: Diagnosis not present

## 2016-01-26 DIAGNOSIS — M20001 Unspecified deformity of right finger(s): Secondary | ICD-10-CM | POA: Diagnosis not present

## 2016-01-26 DIAGNOSIS — F411 Generalized anxiety disorder: Secondary | ICD-10-CM | POA: Diagnosis not present

## 2016-01-26 DIAGNOSIS — R479 Unspecified speech disturbances: Secondary | ICD-10-CM | POA: Diagnosis not present

## 2016-01-26 DIAGNOSIS — R278 Other lack of coordination: Secondary | ICD-10-CM | POA: Diagnosis not present

## 2016-01-26 DIAGNOSIS — F329 Major depressive disorder, single episode, unspecified: Secondary | ICD-10-CM | POA: Diagnosis not present

## 2016-01-26 DIAGNOSIS — E876 Hypokalemia: Secondary | ICD-10-CM | POA: Diagnosis not present

## 2016-01-26 DIAGNOSIS — R296 Repeated falls: Secondary | ICD-10-CM | POA: Diagnosis not present

## 2016-01-26 DIAGNOSIS — M6281 Muscle weakness (generalized): Secondary | ICD-10-CM | POA: Diagnosis not present

## 2016-01-26 DIAGNOSIS — G4700 Insomnia, unspecified: Secondary | ICD-10-CM | POA: Diagnosis not present

## 2016-01-26 DIAGNOSIS — R2681 Unsteadiness on feet: Secondary | ICD-10-CM | POA: Diagnosis not present

## 2016-01-26 DIAGNOSIS — M6530 Trigger finger, unspecified finger: Secondary | ICD-10-CM | POA: Diagnosis not present

## 2016-01-27 DIAGNOSIS — M6281 Muscle weakness (generalized): Secondary | ICD-10-CM | POA: Diagnosis not present

## 2016-01-27 DIAGNOSIS — R278 Other lack of coordination: Secondary | ICD-10-CM | POA: Diagnosis not present

## 2016-01-27 DIAGNOSIS — R296 Repeated falls: Secondary | ICD-10-CM | POA: Diagnosis not present

## 2016-01-27 DIAGNOSIS — M20001 Unspecified deformity of right finger(s): Secondary | ICD-10-CM | POA: Diagnosis not present

## 2016-01-27 DIAGNOSIS — R2681 Unsteadiness on feet: Secondary | ICD-10-CM | POA: Diagnosis not present

## 2016-01-27 DIAGNOSIS — R479 Unspecified speech disturbances: Secondary | ICD-10-CM | POA: Diagnosis not present

## 2016-01-27 DIAGNOSIS — I10 Essential (primary) hypertension: Secondary | ICD-10-CM | POA: Diagnosis not present

## 2016-01-27 LAB — CBC AND DIFFERENTIAL
HCT: 34 % — AB (ref 36–46)
Hemoglobin: 11.3 g/dL — AB (ref 12.0–16.0)
Platelets: 340 10*3/uL (ref 150–399)
WBC: 5.8 10^3/mL

## 2016-01-27 LAB — BASIC METABOLIC PANEL
BUN: 19 mg/dL (ref 4–21)
CREATININE: 0.8 mg/dL (ref <=1.1)
Glucose: 91 mg/dL
POTASSIUM: 4.1 mmol/L (ref 3.4–5.3)
Sodium: 138 mmol/L (ref 137–147)

## 2016-01-28 ENCOUNTER — Other Ambulatory Visit: Payer: Self-pay | Admitting: *Deleted

## 2016-01-31 DIAGNOSIS — R296 Repeated falls: Secondary | ICD-10-CM | POA: Diagnosis not present

## 2016-01-31 DIAGNOSIS — M20001 Unspecified deformity of right finger(s): Secondary | ICD-10-CM | POA: Diagnosis not present

## 2016-01-31 DIAGNOSIS — M6281 Muscle weakness (generalized): Secondary | ICD-10-CM | POA: Diagnosis not present

## 2016-01-31 DIAGNOSIS — R2681 Unsteadiness on feet: Secondary | ICD-10-CM | POA: Diagnosis not present

## 2016-01-31 DIAGNOSIS — R278 Other lack of coordination: Secondary | ICD-10-CM | POA: Diagnosis not present

## 2016-01-31 DIAGNOSIS — R479 Unspecified speech disturbances: Secondary | ICD-10-CM | POA: Diagnosis not present

## 2016-02-01 ENCOUNTER — Non-Acute Institutional Stay: Payer: Medicare Other | Admitting: Internal Medicine

## 2016-02-01 ENCOUNTER — Encounter: Payer: Self-pay | Admitting: Internal Medicine

## 2016-02-01 VITALS — BP 144/66 | HR 60 | Temp 97.6°F | Ht 62.0 in | Wt 127.0 lb

## 2016-02-01 DIAGNOSIS — R55 Syncope and collapse: Secondary | ICD-10-CM

## 2016-02-01 DIAGNOSIS — I10 Essential (primary) hypertension: Secondary | ICD-10-CM

## 2016-02-01 DIAGNOSIS — R479 Unspecified speech disturbances: Secondary | ICD-10-CM | POA: Diagnosis not present

## 2016-02-01 DIAGNOSIS — M6530 Trigger finger, unspecified finger: Secondary | ICD-10-CM | POA: Diagnosis not present

## 2016-02-01 DIAGNOSIS — R609 Edema, unspecified: Secondary | ICD-10-CM

## 2016-02-01 DIAGNOSIS — F0390 Unspecified dementia without behavioral disturbance: Secondary | ICD-10-CM

## 2016-02-01 DIAGNOSIS — R278 Other lack of coordination: Secondary | ICD-10-CM | POA: Diagnosis not present

## 2016-02-01 DIAGNOSIS — M6281 Muscle weakness (generalized): Secondary | ICD-10-CM | POA: Diagnosis not present

## 2016-02-01 DIAGNOSIS — R296 Repeated falls: Secondary | ICD-10-CM | POA: Diagnosis not present

## 2016-02-01 DIAGNOSIS — M20001 Unspecified deformity of right finger(s): Secondary | ICD-10-CM | POA: Diagnosis not present

## 2016-02-01 DIAGNOSIS — R2681 Unsteadiness on feet: Secondary | ICD-10-CM | POA: Diagnosis not present

## 2016-02-01 NOTE — Progress Notes (Signed)
[place] Room Number: AL08  Place of Service: Clinic (12)     Allergies  Allergen Reactions  . Ace Inhibitors Other (See Comments)    Reaction unknown/ listed on MAR  . Hydrochlorothiazide Other (See Comments)    Reaction unknown/ listed on MAR  . Morphine And Related Other (See Comments)    Hallucinations    Chief Complaint  Patient presents with  . Medical Management of Chronic Issues    3 month medication management blood pressure, depression  . fingers    on right hand locked up on her.     HPI:  Hospitalized 01/21/16 to 01/23/16 for syncope, dehydration , and bradycardia into the 30's. BP and pulse were in normal range today. BUN and Creatinine have improved since hospitalization. Was also treated for cellulitis of the legs  Essential hypertension - controlled  Edema, unspecified type - 1+ bipedal  Trigger finger, acquired - right 3rd and 4th. She would like something done about this now.  Senile dementia without behavioral disturbance - unchanged    Medications: Patient's Medications  New Prescriptions   No medications on file  Previous Medications   ASPIRIN EC 81 MG TABLET    Take 1 tablet (81 mg total) by mouth daily.   CALCIUM CARBONATE-VITAMIN D (CALCIUM 600+D) 600-200 MG-UNIT TABS    Take 1 tablet by mouth daily.    CITALOPRAM (CELEXA) 10 MG TABLET    One daily to help depression and anxiety   IBUPROFEN 200 MG CAPS    Take 2 capsules by mouth every 6 (six) hours as needed.   LEVOTHYROXINE (SYNTHROID, LEVOTHROID) 25 MCG TABLET    Take 25 mcg by mouth daily before breakfast.   POLYETHYLENE GLYCOL (MIRALAX / GLYCOLAX) PACKET    Take 17 g by mouth daily.   RISPERIDONE (RISPERDAL) 0.5 MG TABLET    One at bedtime to prevent hallucinations and anxiety  Modified Medications   No medications on file  Discontinued Medications   No medications on file     Review of Systems  Constitutional: Negative for activity change, appetite change  and fatigue.  HENT: Positive for hearing loss. Negative for ear pain.   Eyes: Negative.   Respiratory: Negative.  Negative for shortness of breath.   Cardiovascular: Positive for leg swelling. Negative for chest pain and palpitations.       HTN  Gastrointestinal: Negative.   Endocrine: Negative.   Genitourinary:       Incontinence  Musculoskeletal: Positive for arthralgias (in feet).       Pain in both hands: R>L. Trigger fingers at roght 3rd and 4th. Contracted into palm most of the time.  Skin: Negative for rash (Erythematous rash of the lower legs nonspecific nature).  Neurological: Negative.   Hematological: Negative.   Psychiatric/Behavioral: Positive for confusion and sleep disturbance (Wakes at 3 AM. Full of energy then. Uses Motrin PM to get back to sleep. ). The patient is nervous/anxious.        Hx of hallucinations.    Vitals:   02/01/16 1115  BP: (!) 144/66  Pulse: 60  Temp: 97.6 F (36.4 C)  TempSrc: Oral  SpO2: 94%  Weight: 127 lb (57.6 kg)  Height: _0  (1.575 m)   Wt Readings from Last 3 Encounters:  01/25/16 124 lb (56.2 kg)  01/04/16 123 lb 8 oz (56 kg)  12/21/15 124 lb (56.2 kg)    There is no height or weight on file to calculate  BMI.  Physical Exam  Constitutional: She appears well-developed and well-nourished. No distress.  HENT:  Head: Normocephalic and atraumatic.  Severe hearing loss bilaterally. Wearing hearing aids.  Eyes: Conjunctivae and EOM are normal. Pupils are equal, round, and reactive to light.  Neck: Normal range of motion. Neck supple. No JVD present. No tracheal deviation present. No thyromegaly present.  Cardiovascular: Normal rate, regular rhythm and normal heart sounds.  Exam reveals no gallop and no friction rub.   No murmur heard. Pulmonary/Chest: No respiratory distress. She has no wheezes. She has no rales. She exhibits no tenderness.  Abdominal: Bowel sounds are normal. She exhibits no distension and no mass. There is no  tenderness.  Genitourinary: Rectal exam shows guaiac negative stool.  Genitourinary Comments: Examined 06/15/15: Firm area at the top of the vagina. Prolapse of the vaginal walls. Loss of rectal sphincter tone.  Musculoskeletal: Normal range of motion. She exhibits edema. She exhibits no tenderness.  Pain in hands. Right 3rd and 4th trigger finges with contracturesr. Uses walker due to unstable gait. Left 2nd toe is deviated to the great toe and has elevated DIP joint that rubs on her shoe. Trace edema R+L ankles  Lymphadenopathy:    She has no cervical adenopathy.  Neurological: She is alert. No cranial nerve deficit. Coordination normal.  12/29/14 MMSE 26/30 . Failed clock drawing.  Skin: No rash (Nonspecific erythematous rash in both lower legs.) noted. No erythema. No pallor.  Left shin scabbed wound. Scaling and peeling of both lower legs.  Psychiatric: She has a normal mood and affect. Her behavior is normal.     Labs reviewed: Lab Summary Latest Ref Rng & Units 01/27/2016 01/22/2016 01/21/2016  Hemoglobin 12.0 - 16.0 g/dL 11.3(A) 11.9(L) 12.0  Hematocrit 36 - 46 % 34(A) 35.3(L) 35.5(L)  White count 10:3/mL 5.8 7.5 10.4  Platelet count 150 - 399 K/L 340 327 363  Sodium 137 - 147 mmol/L 138 137 138  Potassium 3.4 - 5.3 mmol/L 4.1 4.2 4.3  Calcium 8.9 - 10.3 mg/dL (None) 8.6(L) 9.3  Phosphorus - (None) (None) (None)  Creatinine 0.5 - 1.1 mg/dL 0.8 0.96 0.85  AST 15 - 41 U/L (None) (None) 19  Alk Phos 38 - 126 U/L (None) (None) 62  Bilirubin 0.3 - 1.2 mg/dL (None) (None) 0.8  Glucose mg/dL 91 85 102(H)  Cholesterol - (None) (None) (None)  HDL cholesterol - (None) (None) (None)  Triglycerides - (None) (None) (None)  LDL Direct - (None) (None) (None)  LDL Calc - (None) (None) (None)  Total protein 6.5 - 8.1 g/dL (None) (None) 6.4(L)  Albumin 3.5 - 5.0 g/dL (None) (None) 3.4(L)  Some recent data might be hidden   Lab Results  Component Value Date   TSH 3.135 01/22/2016    Lab Results  Component Value Date   BUN 19 01/27/2016   BUN 25 (H) 01/22/2016   BUN 27 (H) 01/21/2016   Lab Results  Component Value Date   CREATININE 0.8 01/27/2016   CREATININE 0.96 01/22/2016   CREATININE 0.85 01/21/2016   Lab Results  Component Value Date   HGBA1C 5.6 12/11/2014       Assessment/Plan 1. Syncope, unspecified syncope type Appears to have recovered. No residual problems  2. Essential hypertension controlled  3. Edema, unspecified type stable  4. Trigger finger, acquired -refer to Dr. Sudie Bailey  5. Senile dementia without behavioral disturbance unchanged

## 2016-02-03 DIAGNOSIS — R479 Unspecified speech disturbances: Secondary | ICD-10-CM | POA: Diagnosis not present

## 2016-02-03 DIAGNOSIS — R296 Repeated falls: Secondary | ICD-10-CM | POA: Diagnosis not present

## 2016-02-03 DIAGNOSIS — R278 Other lack of coordination: Secondary | ICD-10-CM | POA: Diagnosis not present

## 2016-02-03 DIAGNOSIS — M6281 Muscle weakness (generalized): Secondary | ICD-10-CM | POA: Diagnosis not present

## 2016-02-03 DIAGNOSIS — R2681 Unsteadiness on feet: Secondary | ICD-10-CM | POA: Diagnosis not present

## 2016-02-03 DIAGNOSIS — M20001 Unspecified deformity of right finger(s): Secondary | ICD-10-CM | POA: Diagnosis not present

## 2016-02-04 NOTE — Progress Notes (Signed)
PT evaluation addendum Late entry for 01/22/16 Visit diagnosis   01/22/16 1500  PT Assessment  PT Recommendation/Assessment Patient needs continued PT services  PT Problem List Decreased strength;Decreased activity tolerance;Decreased balance;Decreased mobility;Decreased cognition (Visit diagnosis:  Unsteadiness on feet)  Barriers to Discharge Decreased caregiver support  Barriers to Discharge Comments needs 24/7 assist  02/04/2016 [facility], [place]

## 2016-02-07 DIAGNOSIS — R479 Unspecified speech disturbances: Secondary | ICD-10-CM | POA: Diagnosis not present

## 2016-02-07 DIAGNOSIS — M6281 Muscle weakness (generalized): Secondary | ICD-10-CM | POA: Diagnosis not present

## 2016-02-07 DIAGNOSIS — M20001 Unspecified deformity of right finger(s): Secondary | ICD-10-CM | POA: Diagnosis not present

## 2016-02-07 DIAGNOSIS — R278 Other lack of coordination: Secondary | ICD-10-CM | POA: Diagnosis not present

## 2016-02-07 DIAGNOSIS — R2681 Unsteadiness on feet: Secondary | ICD-10-CM | POA: Diagnosis not present

## 2016-02-07 DIAGNOSIS — R296 Repeated falls: Secondary | ICD-10-CM | POA: Diagnosis not present

## 2016-02-08 DIAGNOSIS — R296 Repeated falls: Secondary | ICD-10-CM | POA: Diagnosis not present

## 2016-02-08 DIAGNOSIS — R278 Other lack of coordination: Secondary | ICD-10-CM | POA: Diagnosis not present

## 2016-02-08 DIAGNOSIS — M6281 Muscle weakness (generalized): Secondary | ICD-10-CM | POA: Diagnosis not present

## 2016-02-08 DIAGNOSIS — M20001 Unspecified deformity of right finger(s): Secondary | ICD-10-CM | POA: Diagnosis not present

## 2016-02-08 DIAGNOSIS — R479 Unspecified speech disturbances: Secondary | ICD-10-CM | POA: Diagnosis not present

## 2016-02-08 DIAGNOSIS — R2681 Unsteadiness on feet: Secondary | ICD-10-CM | POA: Diagnosis not present

## 2016-02-10 DIAGNOSIS — M6281 Muscle weakness (generalized): Secondary | ICD-10-CM | POA: Diagnosis not present

## 2016-02-10 DIAGNOSIS — R278 Other lack of coordination: Secondary | ICD-10-CM | POA: Diagnosis not present

## 2016-02-10 DIAGNOSIS — R296 Repeated falls: Secondary | ICD-10-CM | POA: Diagnosis not present

## 2016-02-10 DIAGNOSIS — M20001 Unspecified deformity of right finger(s): Secondary | ICD-10-CM | POA: Diagnosis not present

## 2016-02-10 DIAGNOSIS — R479 Unspecified speech disturbances: Secondary | ICD-10-CM | POA: Diagnosis not present

## 2016-02-10 DIAGNOSIS — R2681 Unsteadiness on feet: Secondary | ICD-10-CM | POA: Diagnosis not present

## 2016-02-15 ENCOUNTER — Non-Acute Institutional Stay: Payer: Medicare Other | Admitting: Nurse Practitioner

## 2016-02-15 ENCOUNTER — Encounter: Payer: Self-pay | Admitting: Nurse Practitioner

## 2016-02-15 DIAGNOSIS — M6281 Muscle weakness (generalized): Secondary | ICD-10-CM | POA: Diagnosis not present

## 2016-02-15 DIAGNOSIS — R278 Other lack of coordination: Secondary | ICD-10-CM | POA: Diagnosis not present

## 2016-02-15 DIAGNOSIS — E039 Hypothyroidism, unspecified: Secondary | ICD-10-CM

## 2016-02-15 DIAGNOSIS — R443 Hallucinations, unspecified: Secondary | ICD-10-CM

## 2016-02-15 DIAGNOSIS — K5900 Constipation, unspecified: Secondary | ICD-10-CM

## 2016-02-15 DIAGNOSIS — R296 Repeated falls: Secondary | ICD-10-CM | POA: Diagnosis not present

## 2016-02-15 DIAGNOSIS — F321 Major depressive disorder, single episode, moderate: Secondary | ICD-10-CM | POA: Diagnosis not present

## 2016-02-15 DIAGNOSIS — R609 Edema, unspecified: Secondary | ICD-10-CM | POA: Diagnosis not present

## 2016-02-15 DIAGNOSIS — F0390 Unspecified dementia without behavioral disturbance: Secondary | ICD-10-CM

## 2016-02-15 DIAGNOSIS — E538 Deficiency of other specified B group vitamins: Secondary | ICD-10-CM | POA: Diagnosis not present

## 2016-02-15 DIAGNOSIS — R2681 Unsteadiness on feet: Secondary | ICD-10-CM | POA: Diagnosis not present

## 2016-02-15 DIAGNOSIS — R479 Unspecified speech disturbances: Secondary | ICD-10-CM | POA: Diagnosis not present

## 2016-02-15 DIAGNOSIS — M20001 Unspecified deformity of right finger(s): Secondary | ICD-10-CM | POA: Diagnosis not present

## 2016-02-15 NOTE — Assessment & Plan Note (Signed)
12/13/15 wbc 9.1, Hgb 12.6, plt 413, Na 135, K 3.7, Bun 18, creat 0.80, TSH 5.99, MMSE 26/30 12/14/15 Levothyroxine 4mcg daily, TSH 12 weeks.

## 2016-02-15 NOTE — Assessment & Plan Note (Signed)
Minimal, off Diuretics.

## 2016-02-15 NOTE — Assessment & Plan Note (Signed)
Stable, continue Risperdal.  °

## 2016-02-15 NOTE — Assessment & Plan Note (Signed)
12/12/15 mmse 20/30, continue AL

## 2016-02-15 NOTE — Assessment & Plan Note (Signed)
Her mood is stable, continue Risperdal 0.5mg,  Celexa 10mg.  

## 2016-02-15 NOTE — Assessment & Plan Note (Signed)
Adding Vit B12 1052mcg po daily, f/u Vit B12 level.

## 2016-02-15 NOTE — Assessment & Plan Note (Signed)
Stable, continue MiraLax daily.  

## 2016-02-15 NOTE — Progress Notes (Signed)
Location:  [place] Room Number: 8 Place of Service:  ALF [phone]) Provider:  Mast, Manxie  NP  Jeanmarie Hubert, MD  Patient Care Team: Estill Dooms, MD as PCP - General (Internal Medicine) [facility] Gaynelle Arabian, MD as Consulting Physician (Orthopedic Surgery) Man Otho Darner, NP as Nurse Practitioner (Internal Medicine)  Extended Emergency Contact Information Primary Emergency Contact: Peri [place] Mobile Phone: [phone] Relation: Daughter Secondary Emergency Contact: Kerby Less [place] of [place] Phone: [phone] Relation: Daughter  Code Status:  DNR Goals of care: Advanced Directive information Advanced Directives 02/15/2016  Does Patient Have a Medical Advance Directive? Yes  Type of Paramedic of [place];Living will;Out of facility DNR (pink MOST or yellow form)  Does patient want to make changes to medical advance directive? No - Patient declined  Copy of [place] in Chart? Yes  Pre-existing out of facility DNR order (yellow form or pink MOST form) -     Chief Complaint  Patient presents with  . Acute Visit    low B-12 level    HPI:  Pt is a 83 y.o. female seen today for an acute visit for Vit B12 deficiency.     Hx of hallucination, insomnia, anxiety, stable while on Risperdal 0.5mg  qd, Celexa 10mg , HTN is controlled, off  Losartan, Metoprolol, and Amlodipine. Chronic edema BLE is minimal, off Furosemide 20mg . TSH 5.99 12/13/15, started Levothyroxine 35mcg 12/14/15, f/u TSH scheduled.    Past Medical History:  Diagnosis Date  . Abnormality of gait 03/15/2010  . Anxiety state, unspecified 03/15/2010  . Depression, major 06/15/2015  . Depressive disorder, not elsewhere classified 03/15/2010  . Dysphagia, unspecified(787.20) 03/15/2010  . Generalized anxiety disorder 06/23/2014  . Herpes zoster without mention of complication [id number]  . Other  alteration of consciousness 03/15/2010  . Pain in hand 08/13/12  . Personal history of fall 03/15/2010  . Senile osteoporosis 03/15/2010  . Unspecified constipation 03/15/2010  . Unspecified essential hypertension 03/15/2010  . Unspecified hearing loss 08/22/2011  . Unspecified urinary incontinence 03/15/2010  . Urinary incontinence 03/15/2010  . Vaginal prolapse 06/15/2015   Past Surgical History:  Procedure Laterality Date  . INCISION / DRAINAGE HAND / FINGER Right 06/2008   hand  . SPINE SURGERY  12/2006    Allergies  Allergen Reactions  . Ace Inhibitors Other (See Comments)    Reaction unknown/ listed on MAR  . Hydrochlorothiazide Other (See Comments)    Reaction unknown/ listed on MAR  . Morphine And Related Other (See Comments)    Hallucinations    Allergies as of 02/15/2016      Reactions   Ace Inhibitors Other (See Comments)   Reaction unknown/ listed on MAR   Hydrochlorothiazide Other (See Comments)   Reaction unknown/ listed on MAR   Morphine And Related Other (See Comments)   Hallucinations      Medication List       Accurate as of 02/15/16  4:08 PM. Always use your most recent med list.          aspirin EC 81 MG tablet Take 1 tablet (81 mg total) by mouth daily.   CALCIUM 600+D 600-200 MG-UNIT Tabs Generic drug:  Calcium Carbonate-Vitamin D Take 1 tablet by mouth daily.   citalopram 10 MG tablet Commonly known as:  CELEXA One daily to help depression and anxiety   Ibuprofen 200 MG Caps Take 2 capsules by mouth every 6 (six) hours  as needed.   levothyroxine 25 MCG tablet Commonly known as:  SYNTHROID, LEVOTHROID Take 25 mcg by mouth daily before breakfast.   polyethylene glycol packet Commonly known as:  MIRALAX / GLYCOLAX Take 17 g by mouth daily.   risperiDONE 0.5 MG tablet Commonly known as:  RISPERDAL One at bedtime to prevent hallucinations and anxiety       Review of Systems  Constitutional: Negative for activity change, appetite change  and fatigue.  HENT: Positive for hearing loss. Negative for ear pain.   Eyes: Negative.   Respiratory: Negative.  Negative for shortness of breath.   Cardiovascular: Positive for leg swelling. Negative for chest pain and palpitations.       HTN  Gastrointestinal: Negative.   Endocrine: Negative.   Genitourinary:       Incontinence  Musculoskeletal: Positive for arthralgias (in feet).       Pain in both hands: R>L. Trigger fingers at roght 3rd and 4th. Contracted into palm most of the time.  Skin: Negative for rash (Erythematous rash of the lower legs nonspecific nature).  Neurological: Negative.   Hematological: Negative.   Psychiatric/Behavioral: Positive for confusion and sleep disturbance (Wakes at 3 AM. Full of energy then. Uses Motrin PM to get back to sleep. ). The patient is nervous/anxious.        Hx of hallucinations.    Immunization History  Administered Date(s) Administered  . Influenza Whole 10/24/2011, 10/24/2012  . Influenza-Unspecified 11/06/2013, 11/04/2015  . PPD Test 01/11/2015  . Pneumococcal Polysaccharide-23 01/23/2009  . Td 01/23/2009   Pertinent  Health Maintenance Due  Topic Date Due  . DEXA SCAN  07/26/1984  . PNA vac Low Risk Adult (2 of 2 - PCV13) 01/23/2010  . INFLUENZA VACCINE  Completed   Fall Risk  09/21/2015 06/15/2015 01/05/2015 12/29/2014 12/29/2014  Falls in the past year? No No No No No  Number falls in past yr: - - - - -  Injury with Fall? - - - - -   Functional Status Survey:    Vitals:   02/15/16 1440  BP: (!) 149/80  Pulse: 68  Resp: 20  Temp: 98.3 F (36.8 C)  SpO2: 97%  Weight: 127 lb (57.6 kg)  Height: 5\' 2"  (1.575 m)   Body mass index is 23.23 kg/m. Physical Exam  Constitutional: She appears well-developed and well-nourished. No distress.  HENT:  Head: Normocephalic and atraumatic.  Severe hearing loss bilaterally. Wearing hearing aids.  Eyes: Conjunctivae and EOM are normal. Pupils are equal, round, and reactive to  light.  Neck: Normal range of motion. Neck supple. No JVD present. No tracheal deviation present. No thyromegaly present.  Cardiovascular: Normal rate, regular rhythm and normal heart sounds.  Exam reveals no gallop and no friction rub.   No murmur heard. Pulmonary/Chest: No respiratory distress. She has no wheezes. She has no rales. She exhibits no tenderness.  Abdominal: Bowel sounds are normal. She exhibits no distension and no mass. There is no tenderness.  Genitourinary: Rectal exam shows guaiac negative stool.  Genitourinary Comments: Examined 06/15/15: Firm area at the top of the vagina. Prolapse of the vaginal walls. Loss of rectal sphincter tone.  Musculoskeletal: Normal range of motion. She exhibits edema. She exhibits no tenderness.  Pain in hands. Right 3rd and 4th trigger finges with contracturesr. Uses walker due to unstable gait. Left 2nd toe is deviated to the great toe and has elevated DIP joint that rubs on her shoe. Trace edema R+L ankles  Lymphadenopathy:  She has no cervical adenopathy.  Neurological: She is alert. No cranial nerve deficit. Coordination normal.  12/29/14 MMSE 26/30 . Failed clock drawing.  Skin: No rash (Nonspecific erythematous rash in both lower legs.) noted. No erythema. No pallor.  Left shin scabbed wound. Scaling and peeling of both lower legs.  Psychiatric: She has a normal mood and affect. Her behavior is normal.    Labs reviewed:  Recent Labs  11/10/15 1452 01/21/16 1412 01/21/16 1815 01/22/16 0532 01/27/16  NA 138 138  --  137 138  K 4.0 4.3  --  4.2 4.1  CL 100* 101  --  103  --   CO2 30 27  --  27  --   GLUCOSE 105* 102*  --  85  --   BUN 20 27*  --  25* 19  CREATININE 0.70 0.85  --  0.96 0.8  CALCIUM 8.8* 9.3  --  8.6*  --   MG  --   --  1.9  --   --     Recent Labs  01/21/16 1412  AST 19  ALT 16  ALKPHOS 62  BILITOT 0.8  PROT 6.4*  ALBUMIN 3.4*    Recent Labs  11/10/15 1452 01/21/16 1412 01/22/16 0532 01/27/16   WBC 8.1 10.4 7.5 5.8  NEUTROABS 5.8 8.0*  --   --   HGB 13.1 12.0 11.9* 11.3*  HCT 38.5 35.5* 35.3* 34*  MCV 98.2 98.3 97.8  --   PLT 343 363 327 340   Lab Results  Component Value Date   TSH 3.135 01/22/2016   Lab Results  Component Value Date   HGBA1C 5.6 12/11/2014   Lab Results  Component Value Date   CHOL 178 12/11/2014   HDL 60 12/11/2014   LDLCALC 97 12/11/2014   TRIG 106 12/11/2014   CHOLHDL 3.0 12/11/2014    Significant Diagnostic Results in last 30 days:  Dg Chest 2 View  Result Date: 01/21/2016 CLINICAL DATA:  Syncopal episode today EXAM: CHEST  2 VIEW COMPARISON:  Chest x-ray of 12/10/2014 FINDINGS: No active infiltrate or effusion is seen. Moderate cardiomegaly is stable. The descending thoracic aorta is ectatic. Moderate thoracic aortic atherosclerosis is noted. The bones are diffusely osteopenic. Vertebroplasty in the lower thoracic or lumbar spine appears stable. IMPRESSION: 1. Stable moderate cardiomegaly.  No active lung disease. 2. Moderate thoracic aortic atherosclerosis. Electronically Signed   By: Ivar Drape M.D.   On: 01/21/2016 14:01   Ct Head Wo Contrast  Result Date: 01/21/2016 CLINICAL DATA:  Syncope EXAM: CT HEAD WITHOUT CONTRAST TECHNIQUE: Contiguous axial images were obtained from the base of the skull through the vertex without intravenous contrast. COMPARISON:  12/10/2014 FINDINGS: Brain: There is no evidence for acute hemorrhage, hydrocephalus, mass lesion, or abnormal extra-axial fluid collection. No definite CT evidence for acute infarction. Diffuse loss of parenchymal volume is consistent with atrophy. Patchy low attenuation in the deep hemispheric and periventricular white matter is nonspecific, but likely reflects chronic microvascular ischemic demyelination. Vascular: Atherosclerotic calcification is visualized in the carotid arteries. No dense MCA sign. Major dural sinuses are unremarkable. Skull: No evidence for fracture. No worrisome lytic  or sclerotic lesion. Sinuses/Orbits: Opacification posterior right ethmoid air cells noted. Remaining visualized paranasal sinuses and mastoid air cells are clear. Visualized portions of the globes and intraorbital fat are unremarkable. Other: None. IMPRESSION: 1. Stable exam.  No acute intracranial abnormality. 2. Moderate atrophy with chronic small vessel white matter ischemic disease. Electronically Signed  By: Misty Stanley M.D.   On: 01/21/2016 15:32    Assessment/Plan Vitamin B 12 deficiency Adding Vit B12 1022mcg po daily, f/u Vit B12 level.   Depression, major Her mood is stable, continue Risperdal 0.5mg ,  Celexa 10mg     Constipation Stable, continue MiraLax daily   Hypothyroidism 12/13/15 wbc 9.1, Hgb 12.6, plt 413, Na 135, K 3.7, Bun 18, creat 0.80, TSH 5.99, MMSE 26/30 12/14/15 Levothyroxine 82mcg daily, TSH 12 weeks.    Senile dementia 12/12/15 mmse 20/30, continue AL  Hallucinations Stable, continue Risperdal.   Edema Minimal, off Diuretics.      Family/ staff Communication: AL  Labs/tests ordered:  Vit B12 level

## 2016-02-21 DIAGNOSIS — M20001 Unspecified deformity of right finger(s): Secondary | ICD-10-CM | POA: Diagnosis not present

## 2016-02-21 DIAGNOSIS — R296 Repeated falls: Secondary | ICD-10-CM | POA: Diagnosis not present

## 2016-02-21 DIAGNOSIS — R2681 Unsteadiness on feet: Secondary | ICD-10-CM | POA: Diagnosis not present

## 2016-02-21 DIAGNOSIS — M6281 Muscle weakness (generalized): Secondary | ICD-10-CM | POA: Diagnosis not present

## 2016-02-21 DIAGNOSIS — R479 Unspecified speech disturbances: Secondary | ICD-10-CM | POA: Diagnosis not present

## 2016-02-21 DIAGNOSIS — R278 Other lack of coordination: Secondary | ICD-10-CM | POA: Diagnosis not present

## 2016-02-22 DIAGNOSIS — M6281 Muscle weakness (generalized): Secondary | ICD-10-CM | POA: Diagnosis not present

## 2016-02-22 DIAGNOSIS — R2681 Unsteadiness on feet: Secondary | ICD-10-CM | POA: Diagnosis not present

## 2016-02-22 DIAGNOSIS — M20001 Unspecified deformity of right finger(s): Secondary | ICD-10-CM | POA: Diagnosis not present

## 2016-02-22 DIAGNOSIS — R278 Other lack of coordination: Secondary | ICD-10-CM | POA: Diagnosis not present

## 2016-02-22 DIAGNOSIS — R296 Repeated falls: Secondary | ICD-10-CM | POA: Diagnosis not present

## 2016-02-22 DIAGNOSIS — R479 Unspecified speech disturbances: Secondary | ICD-10-CM | POA: Diagnosis not present

## 2016-02-25 DIAGNOSIS — N390 Urinary tract infection, site not specified: Secondary | ICD-10-CM | POA: Diagnosis not present

## 2016-02-29 ENCOUNTER — Non-Acute Institutional Stay: Payer: Medicare Other | Admitting: Nurse Practitioner

## 2016-02-29 ENCOUNTER — Encounter: Payer: Self-pay | Admitting: Nurse Practitioner

## 2016-02-29 DIAGNOSIS — F0390 Unspecified dementia without behavioral disturbance: Secondary | ICD-10-CM | POA: Diagnosis not present

## 2016-02-29 DIAGNOSIS — K5900 Constipation, unspecified: Secondary | ICD-10-CM

## 2016-02-29 DIAGNOSIS — I10 Essential (primary) hypertension: Secondary | ICD-10-CM | POA: Diagnosis not present

## 2016-02-29 DIAGNOSIS — F321 Major depressive disorder, single episode, moderate: Secondary | ICD-10-CM | POA: Diagnosis not present

## 2016-02-29 DIAGNOSIS — E039 Hypothyroidism, unspecified: Secondary | ICD-10-CM | POA: Diagnosis not present

## 2016-02-29 DIAGNOSIS — R609 Edema, unspecified: Secondary | ICD-10-CM | POA: Diagnosis not present

## 2016-02-29 DIAGNOSIS — E876 Hypokalemia: Secondary | ICD-10-CM

## 2016-02-29 DIAGNOSIS — R443 Hallucinations, unspecified: Secondary | ICD-10-CM

## 2016-02-29 NOTE — Assessment & Plan Note (Signed)
12/13/15 wbc 9.1, Hgb 12.6, plt 413, Na 135, K 3.7, Bun 18, creat 0.80, TSH 5.99, MMSE 26/30 12/14/15 Levothyroxine 15mcg daily, TSH 12 weeks.

## 2016-02-29 NOTE — Progress Notes (Signed)
Location:  [place] Room Number: 8 Place of Service:  ALF [phone]) Provider:  Damario Gillie, Manxie  NP   Jeanmarie Hubert, MD  Patient Care Team: Estill Dooms, MD as PCP - General (Internal Medicine) [facility] Gaynelle Arabian, MD as Consulting Physician (Orthopedic Surgery) Zaydee Aina Otho Darner, NP as Nurse Practitioner (Internal Medicine)  Extended Emergency Contact Information Primary Emergency Contact: Peri Jefferson [place] of [place] Mobile Phone: [phone] Relation: Daughter Secondary Emergency Contact: Kerby Less [place] of [place] Phone: [phone] Relation: Daughter  Code Status:  DNR Goals of care: Advanced Directive information Advanced Directives 02/29/2016  Does Patient Have a Medical Advance Directive? Yes  Type of Paramedic of [place];Living will;Out of facility DNR (pink MOST or yellow form)  Does patient want to make changes to medical advance directive? No - Patient declined  Copy of [place] in Chart? Yes  Pre-existing out of facility DNR order (yellow form or pink MOST form) Yellow form placed in chart (order not valid for inpatient use)     Chief Complaint  Patient presents with  . Acute Visit    Hit (R) side on the dresser, very confused    HPI:  Pt is a 83 y.o. female seen today for an acute visit for elevated SBP in 170-180s, denied chest pain, palpitation, HA, or dizziness.   Hx of hallucination, insomnia, anxiety, stable while on Risperdal 0.5mg  qd, Celexa 10mg , HTN, off Losartan, Metoprolol, and Amlodipine. Chronic edema BLE is minimal, off Furosemide 20mg . TSH 5.99 12/13/15, started Levothyroxine 75mcg 12/14/15, f/u TSH scheduled.    Past Medical History:  Diagnosis Date  . Abnormality of gait 03/15/2010  . Anxiety state, unspecified 03/15/2010  . Depression, major 06/15/2015  . Depressive disorder, not elsewhere classified 03/15/2010  . Dysphagia,  unspecified(787.20) 03/15/2010  . Generalized anxiety disorder 06/23/2014  . Herpes zoster without mention of complication [id number]  . Other alteration of consciousness 03/15/2010  . Pain in hand 08/13/12  . Personal history of fall 03/15/2010  . Senile osteoporosis 03/15/2010  . Unspecified constipation 03/15/2010  . Unspecified essential hypertension 03/15/2010  . Unspecified hearing loss 08/22/2011  . Unspecified urinary incontinence 03/15/2010  . Urinary incontinence 03/15/2010  . Vaginal prolapse 06/15/2015   Past Surgical History:  Procedure Laterality Date  . INCISION / DRAINAGE HAND / FINGER Right 06/2008   hand  . SPINE SURGERY  12/2006    Allergies  Allergen Reactions  . Ace Inhibitors Other (See Comments)    Reaction unknown/ listed on MAR  . Hydrochlorothiazide Other (See Comments)    Reaction unknown/ listed on MAR  . Morphine And Related Other (See Comments)    Hallucinations    Allergies as of 02/29/2016      Reactions   Ace Inhibitors Other (See Comments)   Reaction unknown/ listed on MAR   Hydrochlorothiazide Other (See Comments)   Reaction unknown/ listed on MAR   Morphine And Related Other (See Comments)   Hallucinations      Medication List       Accurate as of 02/29/16  1:29 PM. Always use your most recent med list.          aspirin EC 81 MG tablet Take 1 tablet (81 mg total) by mouth daily.   CALCIUM 600+D 600-200 MG-UNIT Tabs Generic drug:  Calcium Carbonate-Vitamin D Take 1 tablet by mouth daily.   citalopram 10 MG tablet Commonly known as:  CELEXA One daily to  help depression and anxiety   Ibuprofen 200 MG Caps Take 2 capsules by mouth every 6 (six) hours as needed.   levothyroxine 25 MCG tablet Commonly known as:  SYNTHROID, LEVOTHROID Take 25 mcg by mouth daily before breakfast.   polyethylene glycol packet Commonly known as:  MIRALAX / GLYCOLAX Take 17 g by mouth daily.   risperiDONE 0.5 MG tablet Commonly known as:  RISPERDAL One  at bedtime to prevent hallucinations and anxiety   vitamin B-12 1000 MCG tablet Commonly known as:  CYANOCOBALAMIN Take 1,000 mcg by mouth daily.       Review of Systems  Constitutional: Negative for activity change, appetite change and fatigue.  HENT: Positive for hearing loss. Negative for ear pain.   Eyes: Negative.   Respiratory: Negative.  Negative for shortness of breath.   Cardiovascular: Positive for leg swelling. Negative for chest pain and palpitations.       HTN  Gastrointestinal: Negative.   Endocrine: Negative.   Genitourinary:       Incontinence  Musculoskeletal: Positive for arthralgias (in feet).       Pain in both hands: R>L. Trigger fingers at roght 3rd and 4th. Contracted into palm most of the time.  Skin: Negative for rash (Erythematous rash of the lower legs nonspecific nature).  Neurological: Negative.   Hematological: Negative.   Psychiatric/Behavioral: Positive for confusion and sleep disturbance (Wakes at 3 AM. Full of energy then. Uses Motrin PM to get back to sleep. ). The patient is nervous/anxious.        Hx of hallucinations.    Immunization History  Administered Date(s) Administered  . Influenza Whole 10/24/2011, 10/24/2012  . Influenza-Unspecified 11/06/2013, 11/04/2015  . PPD Test 01/11/2015  . Pneumococcal Polysaccharide-23 01/23/2009  . Td 01/23/2009   Pertinent  Health Maintenance Due  Topic Date Due  . DEXA SCAN  07/26/1984  . PNA vac Low Risk Adult (2 of 2 - PCV13) 01/23/2010  . INFLUENZA VACCINE  Completed   Fall Risk  09/21/2015 06/15/2015 01/05/2015 12/29/2014 12/29/2014  Falls in the past year? No No No No No  Number falls in past yr: - - - - -  Injury with Fall? - - - - -   Functional Status Survey:    Vitals:   02/29/16 1303  BP: (!) 180/80  Pulse: 60  Resp: 20  Temp: 98.5 F (36.9 C)  SpO2: 92%  Weight: 120 lb 12.8 oz (54.8 kg)  Height: 5\' 2"  (1.575 m)   Body mass index is 22.09 kg/m. Physical Exam    Constitutional: She appears well-developed and well-nourished. No distress.  HENT:  Head: Normocephalic and atraumatic.  Severe hearing loss bilaterally. Wearing hearing aids.  Eyes: Conjunctivae and EOM are normal. Pupils are equal, round, and reactive to light.  Neck: Normal range of motion. Neck supple. No JVD present. No tracheal deviation present. No thyromegaly present.  Cardiovascular: Normal rate, regular rhythm and normal heart sounds.  Exam reveals no gallop and no friction rub.   No murmur heard. Pulmonary/Chest: No respiratory distress. She has no wheezes. She has no rales. She exhibits no tenderness.  Abdominal: Bowel sounds are normal. She exhibits no distension and no mass. There is no tenderness.  Genitourinary: Rectal exam shows guaiac negative stool.  Genitourinary Comments: Examined 06/15/15: Firm area at the top of the vagina. Prolapse of the vaginal walls. Loss of rectal sphincter tone.  Musculoskeletal: Normal range of motion. She exhibits edema. She exhibits no tenderness.  Pain in hands.  Right 3rd and 4th trigger finges with contracturesr. Uses walker due to unstable gait. Left 2nd toe is deviated to the great toe and has elevated DIP joint that rubs on her shoe. Trace edema R+L ankles  Lymphadenopathy:    She has no cervical adenopathy.  Neurological: She is alert. No cranial nerve deficit. Coordination normal.  12/29/14 MMSE 26/30 . Failed clock drawing.  Skin: No rash (Nonspecific erythematous rash in both lower legs.) noted. No erythema. No pallor.  Left shin scabbed wound. Scaling and peeling of both lower legs.  Psychiatric: She has a normal mood and affect. Her behavior is normal.    Labs reviewed:  Recent Labs  11/10/15 1452 01/21/16 1412 01/21/16 1815 01/22/16 0532 01/27/16  NA 138 138  --  137 138  K 4.0 4.3  --  4.2 4.1  CL 100* 101  --  103  --   CO2 30 27  --  27  --   GLUCOSE 105* 102*  --  85  --   BUN 20 27*  --  25* 19  CREATININE 0.70  0.85  --  0.96 0.8  CALCIUM 8.8* 9.3  --  8.6*  --   MG  --   --  1.9  --   --     Recent Labs  01/21/16 1412  AST 19  ALT 16  ALKPHOS 62  BILITOT 0.8  PROT 6.4*  ALBUMIN 3.4*    Recent Labs  11/10/15 1452 01/21/16 1412 01/22/16 0532 01/27/16  WBC 8.1 10.4 7.5 5.8  NEUTROABS 5.8 8.0*  --   --   HGB 13.1 12.0 11.9* 11.3*  HCT 38.5 35.5* 35.3* 34*  MCV 98.2 98.3 97.8  --   PLT 343 363 327 340   Lab Results  Component Value Date   TSH 3.135 01/22/2016   Lab Results  Component Value Date   HGBA1C 5.6 12/11/2014   Lab Results  Component Value Date   CHOL 178 12/11/2014   HDL 60 12/11/2014   LDLCALC 97 12/11/2014   TRIG 106 12/11/2014   CHOLHDL 3.0 12/11/2014    Significant Diagnostic Results in last 30 days:  No results found.  Assessment/Plan Essential hypertension Blood pressure 180/80 today, resume Metoprolol 12.5mg  bid. off  Amlodipine 10mg  daily, Losartan 100mg  daily, and Furosemide 20mg  daily. 01/27/16 wbc 5.8, Hgb 11.3, plt 340, Na 138, K 4.1, Bun 19, creat 0.76    Constipation Stable, continue MiraLax daily  Hypothyroidism 12/13/15 wbc 9.1, Hgb 12.6, plt 413, Na 135, K 3.7, Bun 18, creat 0.80, TSH 5.99, MMSE 26/30 12/14/15 Levothyroxine 45mcg daily, TSH 12 weeks.   Senile dementia 12/12/15 mmse 20/30, continue AL   Hallucinations Stable, continue Risperdal.   Edema Minimal, off Diuretics.   Depression, major Her mood is stable, continue Risperdal 0.5mg ,  Celexa 10mg   Hypokalemia Resolved, 01/27/16 K 4.1    Family/ staff Communication: AL  Labs/tests ordered:  none

## 2016-02-29 NOTE — Assessment & Plan Note (Signed)
Minimal, off Diuretics.

## 2016-02-29 NOTE — Assessment & Plan Note (Signed)
Stable, continue Risperdal.  °

## 2016-02-29 NOTE — Assessment & Plan Note (Signed)
12/12/15 mmse 20/30, continue AL

## 2016-02-29 NOTE — Assessment & Plan Note (Signed)
Resolved, 01/27/16 K 4.1

## 2016-02-29 NOTE — Assessment & Plan Note (Signed)
Stable, continue MiraLax daily.  

## 2016-02-29 NOTE — Assessment & Plan Note (Signed)
Her mood is stable, continue Risperdal 0.5mg,  Celexa 10mg.  

## 2016-02-29 NOTE — Assessment & Plan Note (Addendum)
Blood pressure 180/80 today, resume Metoprolol 12.5mg  bid. off  Amlodipine 10mg  daily, Losartan 100mg  daily, and Furosemide 20mg  daily. 01/27/16 wbc 5.8, Hgb 11.3, plt 340, Na 138, K 4.1, Bun 19, creat 0.76

## 2016-03-16 DIAGNOSIS — M25561 Pain in right knee: Secondary | ICD-10-CM | POA: Diagnosis not present

## 2016-03-17 ENCOUNTER — Encounter (HOSPITAL_COMMUNITY): Payer: Self-pay | Admitting: Nurse Practitioner

## 2016-03-17 ENCOUNTER — Emergency Department (HOSPITAL_COMMUNITY): Payer: Medicare Other

## 2016-03-17 ENCOUNTER — Encounter: Payer: Self-pay | Admitting: Nurse Practitioner

## 2016-03-17 ENCOUNTER — Observation Stay (HOSPITAL_COMMUNITY)
Admission: EM | Admit: 2016-03-17 | Discharge: 2016-03-19 | Disposition: A | Discharge status: Skilled Nursing Facility | Payer: Medicare Other | Attending: Family Medicine | Admitting: Family Medicine

## 2016-03-17 DIAGNOSIS — S79911A Unspecified injury of right hip, initial encounter: Secondary | ICD-10-CM | POA: Diagnosis not present

## 2016-03-17 DIAGNOSIS — Z66 Do not resuscitate: Secondary | ICD-10-CM | POA: Diagnosis not present

## 2016-03-17 DIAGNOSIS — I700 Atherosclerosis of aorta: Secondary | ICD-10-CM | POA: Diagnosis not present

## 2016-03-17 DIAGNOSIS — W19XXXA Unspecified fall, initial encounter: Secondary | ICD-10-CM | POA: Diagnosis present

## 2016-03-17 DIAGNOSIS — S82091A Other fracture of right patella, initial encounter for closed fracture: Secondary | ICD-10-CM | POA: Diagnosis present

## 2016-03-17 DIAGNOSIS — S82092A Other fracture of left patella, initial encounter for closed fracture: Secondary | ICD-10-CM | POA: Diagnosis not present

## 2016-03-17 DIAGNOSIS — M542 Cervicalgia: Secondary | ICD-10-CM | POA: Diagnosis not present

## 2016-03-17 DIAGNOSIS — M4854XA Collapsed vertebra, not elsewhere classified, thoracic region, initial encounter for fracture: Secondary | ICD-10-CM | POA: Diagnosis not present

## 2016-03-17 DIAGNOSIS — W1830XA Fall on same level, unspecified, initial encounter: Secondary | ICD-10-CM | POA: Diagnosis not present

## 2016-03-17 DIAGNOSIS — R296 Repeated falls: Secondary | ICD-10-CM | POA: Insufficient documentation

## 2016-03-17 DIAGNOSIS — R51 Headache: Secondary | ICD-10-CM | POA: Diagnosis not present

## 2016-03-17 DIAGNOSIS — Z885 Allergy status to narcotic agent status: Secondary | ICD-10-CM | POA: Diagnosis not present

## 2016-03-17 DIAGNOSIS — S82009A Unspecified fracture of unspecified patella, initial encounter for closed fracture: Secondary | ICD-10-CM | POA: Insufficient documentation

## 2016-03-17 DIAGNOSIS — F329 Major depressive disorder, single episode, unspecified: Secondary | ICD-10-CM | POA: Insufficient documentation

## 2016-03-17 DIAGNOSIS — S82032A Displaced transverse fracture of left patella, initial encounter for closed fracture: Secondary | ICD-10-CM | POA: Diagnosis not present

## 2016-03-17 DIAGNOSIS — E039 Hypothyroidism, unspecified: Secondary | ICD-10-CM | POA: Diagnosis present

## 2016-03-17 DIAGNOSIS — Z9119 Patient's noncompliance with other medical treatment and regimen: Secondary | ICD-10-CM | POA: Insufficient documentation

## 2016-03-17 DIAGNOSIS — E042 Nontoxic multinodular goiter: Secondary | ICD-10-CM | POA: Diagnosis not present

## 2016-03-17 DIAGNOSIS — F411 Generalized anxiety disorder: Secondary | ICD-10-CM | POA: Diagnosis not present

## 2016-03-17 DIAGNOSIS — S2243XA Multiple fractures of ribs, bilateral, initial encounter for closed fracture: Principal | ICD-10-CM | POA: Insufficient documentation

## 2016-03-17 DIAGNOSIS — S2241XA Multiple fractures of ribs, right side, initial encounter for closed fracture: Secondary | ICD-10-CM | POA: Diagnosis not present

## 2016-03-17 DIAGNOSIS — S2242XA Multiple fractures of ribs, left side, initial encounter for closed fracture: Secondary | ICD-10-CM | POA: Diagnosis not present

## 2016-03-17 DIAGNOSIS — I10 Essential (primary) hypertension: Secondary | ICD-10-CM | POA: Diagnosis not present

## 2016-03-17 DIAGNOSIS — I2510 Atherosclerotic heart disease of native coronary artery without angina pectoris: Secondary | ICD-10-CM | POA: Insufficient documentation

## 2016-03-17 DIAGNOSIS — I719 Aortic aneurysm of unspecified site, without rupture: Secondary | ICD-10-CM | POA: Insufficient documentation

## 2016-03-17 DIAGNOSIS — H9190 Unspecified hearing loss, unspecified ear: Secondary | ICD-10-CM

## 2016-03-17 DIAGNOSIS — N2889 Other specified disorders of kidney and ureter: Secondary | ICD-10-CM | POA: Diagnosis not present

## 2016-03-17 DIAGNOSIS — S8292XA Unspecified fracture of left lower leg, initial encounter for closed fracture: Secondary | ICD-10-CM | POA: Diagnosis not present

## 2016-03-17 DIAGNOSIS — S32592A Other specified fracture of left pubis, initial encounter for closed fracture: Secondary | ICD-10-CM | POA: Insufficient documentation

## 2016-03-17 DIAGNOSIS — S3993XA Unspecified injury of pelvis, initial encounter: Secondary | ICD-10-CM | POA: Diagnosis not present

## 2016-03-17 DIAGNOSIS — G3189 Other specified degenerative diseases of nervous system: Secondary | ICD-10-CM | POA: Insufficient documentation

## 2016-03-17 DIAGNOSIS — Z7982 Long term (current) use of aspirin: Secondary | ICD-10-CM | POA: Insufficient documentation

## 2016-03-17 DIAGNOSIS — Z888 Allergy status to other drugs, medicaments and biological substances status: Secondary | ICD-10-CM | POA: Insufficient documentation

## 2016-03-17 DIAGNOSIS — K5900 Constipation, unspecified: Secondary | ICD-10-CM | POA: Diagnosis present

## 2016-03-17 DIAGNOSIS — S3991XA Unspecified injury of abdomen, initial encounter: Secondary | ICD-10-CM | POA: Diagnosis not present

## 2016-03-17 DIAGNOSIS — S299XXA Unspecified injury of thorax, initial encounter: Secondary | ICD-10-CM | POA: Diagnosis not present

## 2016-03-17 DIAGNOSIS — M4312 Spondylolisthesis, cervical region: Secondary | ICD-10-CM | POA: Insufficient documentation

## 2016-03-17 DIAGNOSIS — I119 Hypertensive heart disease without heart failure: Secondary | ICD-10-CM | POA: Insufficient documentation

## 2016-03-17 DIAGNOSIS — M549 Dorsalgia, unspecified: Secondary | ICD-10-CM | POA: Diagnosis not present

## 2016-03-17 DIAGNOSIS — S2249XA Multiple fractures of ribs, unspecified side, initial encounter for closed fracture: Secondary | ICD-10-CM | POA: Diagnosis present

## 2016-03-17 DIAGNOSIS — I739 Peripheral vascular disease, unspecified: Secondary | ICD-10-CM | POA: Insufficient documentation

## 2016-03-17 DIAGNOSIS — K5730 Diverticulosis of large intestine without perforation or abscess without bleeding: Secondary | ICD-10-CM | POA: Diagnosis not present

## 2016-03-17 DIAGNOSIS — E876 Hypokalemia: Secondary | ICD-10-CM | POA: Insufficient documentation

## 2016-03-17 DIAGNOSIS — F0390 Unspecified dementia without behavioral disturbance: Secondary | ICD-10-CM | POA: Diagnosis not present

## 2016-03-17 DIAGNOSIS — M810 Age-related osteoporosis without current pathological fracture: Secondary | ICD-10-CM | POA: Insufficient documentation

## 2016-03-17 DIAGNOSIS — M25551 Pain in right hip: Secondary | ICD-10-CM | POA: Diagnosis not present

## 2016-03-17 DIAGNOSIS — Z8673 Personal history of transient ischemic attack (TIA), and cerebral infarction without residual deficits: Secondary | ICD-10-CM | POA: Insufficient documentation

## 2016-03-17 DIAGNOSIS — T148XXA Other injury of unspecified body region, initial encounter: Secondary | ICD-10-CM | POA: Diagnosis not present

## 2016-03-17 DIAGNOSIS — S82031A Displaced transverse fracture of right patella, initial encounter for closed fracture: Secondary | ICD-10-CM | POA: Diagnosis not present

## 2016-03-17 DIAGNOSIS — M47816 Spondylosis without myelopathy or radiculopathy, lumbar region: Secondary | ICD-10-CM | POA: Diagnosis not present

## 2016-03-17 LAB — TYPE AND SCREEN
ABO/RH(D): B POS
ANTIBODY SCREEN: NEGATIVE

## 2016-03-17 LAB — COMPREHENSIVE METABOLIC PANEL
ALBUMIN: 4.2 g/dL (ref 3.5–5.0)
ALK PHOS: 59 U/L (ref 38–126)
ALT: 13 U/L — ABNORMAL LOW (ref 14–54)
ANION GAP: 8 (ref 5–15)
AST: 18 U/L (ref 15–41)
BILIRUBIN TOTAL: 1.1 mg/dL (ref 0.3–1.2)
BUN: 17 mg/dL (ref 6–20)
CALCIUM: 9.1 mg/dL (ref 8.9–10.3)
CO2: 29 mmol/L (ref 22–32)
Chloride: 102 mmol/L (ref 101–111)
Creatinine, Ser: 0.62 mg/dL (ref 0.44–1.00)
GLUCOSE: 97 mg/dL (ref 65–99)
POTASSIUM: 3.3 mmol/L — AB (ref 3.5–5.1)
Sodium: 139 mmol/L (ref 135–145)
TOTAL PROTEIN: 6.9 g/dL (ref 6.5–8.1)

## 2016-03-17 LAB — CBC WITH DIFFERENTIAL/PLATELET
BASOS PCT: 0 %
Basophils Absolute: 0 10*3/uL (ref 0.0–0.1)
Eosinophils Absolute: 0.1 10*3/uL (ref 0.0–0.7)
Eosinophils Relative: 1 %
HEMATOCRIT: 40.5 % (ref 36.0–46.0)
HEMOGLOBIN: 13.8 g/dL (ref 12.0–15.0)
LYMPHS ABS: 1.9 10*3/uL (ref 0.7–4.0)
Lymphocytes Relative: 19 %
MCH: 32.9 pg (ref 26.0–34.0)
MCHC: 34.1 g/dL (ref 30.0–36.0)
MCV: 96.4 fL (ref 78.0–100.0)
MONOS PCT: 12 %
Monocytes Absolute: 1.2 10*3/uL — ABNORMAL HIGH (ref 0.1–1.0)
NEUTROS ABS: 6.8 10*3/uL (ref 1.7–7.7)
NEUTROS PCT: 68 %
Platelets: 337 10*3/uL (ref 150–400)
RBC: 4.2 MIL/uL (ref 3.87–5.11)
RDW: 13.7 % (ref 11.5–15.5)
WBC: 10 10*3/uL (ref 4.0–10.5)

## 2016-03-17 LAB — ABO/RH: ABO/RH(D): B POS

## 2016-03-17 LAB — PROTIME-INR
INR: 0.94
PROTHROMBIN TIME: 12.5 s (ref 11.4–15.2)

## 2016-03-17 MED ORDER — ACETAMINOPHEN 325 MG PO TABS
650.0000 mg | ORAL_TABLET | Freq: Four times a day (QID) | ORAL | Status: DC | PRN
  Administered 2016-03-17 – 2016-03-18 (×2): 650 mg via ORAL
  Administered 2016-03-19: 12:00:00 325 mg via ORAL
  Filled 2016-03-17 (×2): qty 2

## 2016-03-17 MED ORDER — IOPAMIDOL (ISOVUE-300) INJECTION 61%
INTRAVENOUS | Status: AC
  Administered 2016-03-17: 75 mL
  Filled 2016-03-17: qty 75

## 2016-03-17 MED ORDER — CITALOPRAM HYDROBROMIDE 20 MG PO TABS
10.0000 mg | ORAL_TABLET | Freq: Every day | ORAL | Status: DC
  Administered 2016-03-17 – 2016-03-19 (×3): 10 mg via ORAL
  Filled 2016-03-17 (×3): qty 1

## 2016-03-17 MED ORDER — LEVOTHYROXINE SODIUM 25 MCG PO TABS
25.0000 ug | ORAL_TABLET | Freq: Every day | ORAL | Status: DC
  Administered 2016-03-18 – 2016-03-19 (×2): 25 ug via ORAL
  Filled 2016-03-17 (×2): qty 1

## 2016-03-17 MED ORDER — METOPROLOL TARTRATE 12.5 MG HALF TABLET
12.5000 mg | ORAL_TABLET | Freq: Two times a day (BID) | ORAL | Status: DC
  Administered 2016-03-18 – 2016-03-19 (×3): 12.5 mg via ORAL
  Filled 2016-03-17 (×3): qty 1

## 2016-03-17 MED ORDER — ACETAMINOPHEN 650 MG RE SUPP: 650.0000 mg | Freq: Four times a day (QID) | RECTAL | Status: DC | PRN

## 2016-03-17 MED ORDER — LORAZEPAM 0.5 MG PO TABS
0.5000 mg | ORAL_TABLET | Freq: Three times a day (TID) | ORAL | Status: DC | PRN
  Administered 2016-03-17 – 2016-03-18 (×2): 0.5 mg via ORAL
  Filled 2016-03-17 (×2): qty 1

## 2016-03-17 MED ORDER — POLYETHYLENE GLYCOL 3350 17 G PO PACK
17.0000 g | PACK | Freq: Every day | ORAL | Status: DC
  Administered 2016-03-18 – 2016-03-19 (×2): 17 g via ORAL
  Filled 2016-03-17 (×2): qty 1

## 2016-03-17 MED ORDER — RISPERIDONE 0.25 MG PO TABS
0.5000 mg | ORAL_TABLET | Freq: Every day | ORAL | Status: DC
  Administered 2016-03-17 – 2016-03-18 (×2): 0.5 mg via ORAL
  Filled 2016-03-17 (×2): qty 2

## 2016-03-17 MED ORDER — ONDANSETRON HCL 4 MG/2ML IJ SOLN: 4.0000 mg | Freq: Four times a day (QID) | INTRAMUSCULAR | Status: DC | PRN

## 2016-03-17 MED ORDER — ONDANSETRON HCL 4 MG PO TABS: 4.0000 mg | ORAL_TABLET | Freq: Four times a day (QID) | ORAL | Status: DC | PRN

## 2016-03-17 NOTE — ED Notes (Signed)
RN contacted Pts daughterJane Zimmer (daughter)  [phone] to update her on Pt status and that we are keeping her tonight.

## 2016-03-17 NOTE — ED Notes (Signed)
RN WILL COLLECT LABS AT TIME OF IV START

## 2016-03-17 NOTE — Consult Note (Signed)
[facility] Consult Note  [name] 06/26/1917  [id number].    Requesting MD: Darl Householder Chief Complaint/Reason for Consult: Fall  HPI:  [name]  is a 83 yo female who presented to [facility] earlier today from SNF. Patient is very drowsy, has a history of dementia, and is therefore not a very good historian; the majority of her history was taken from her chart.  Per report she had a ground level fall 03/16/16. There was no known syncope or LOC. Xrays performed at SNF showed a right patella fracture; patient was brought to the ED for evaluation. On arrival she was found to have a right patella fracture and multiple bilateral rib fractures. Head/spine CT's negative for acute injury. General surgery was consulted for rib fractures.  PMH significant for dementia, HTN, hypothyroidism, anxiety/depression Abdominal surgical history unknown, but patient does have a well healed vertical incision distal to her umbilicus Blood thinners: daily 108m ASA [facility]: [facility]  ROS: Review of Systems  Constitutional: Negative.   HENT: Negative.   Eyes: Negative.   Respiratory: Negative.   Cardiovascular: Negative.   Gastrointestinal: Negative.   Genitourinary: Negative.   Musculoskeletal: Positive for joint pain.       Right knee pain  Skin: Negative.   All systems reviewed and otherwise negative except for as above  Family History  Problem Relation Age of Onset  . Heart disease Father     MI    Past Medical History:  Diagnosis Date  . Abnormality of gait 03/15/2010  . Anxiety state, unspecified 03/15/2010  . Depression, major 06/15/2015  . Depressive disorder, not elsewhere classified 03/15/2010  . Dysphagia, unspecified(787.20) 03/15/2010  . Generalized anxiety disorder 06/23/2014  . Herpes zoster without mention of complication 75/46/5035 . Other alteration of consciousness 03/15/2010  . Pain in hand 08/13/12  . Personal history of fall 03/15/2010   . Senile osteoporosis 03/15/2010  . Unspecified constipation 03/15/2010  . Unspecified essential hypertension 03/15/2010  . Unspecified hearing loss 08/22/2011  . Unspecified urinary incontinence 03/15/2010  . Urinary incontinence 03/15/2010  . Vaginal prolapse 06/15/2015    Past Surgical History:  Procedure Laterality Date  . INCISION / DRAINAGE HAND / FINGER Right 06/2008   hand  . SPINE SURGERY  12/2006    Social History:  reports that she has never smoked. She has never used smokeless tobacco. She reports that she drinks alcohol. She reports that she does not use drugs.  Allergies:  Allergies  Allergen Reactions  . Ace Inhibitors Other (See Comments)    Reaction unknown/ listed on MAR  . Hydrochlorothiazide Other (See Comments)    Reaction unknown/ listed on MAR  . Morphine And Related Other (See Comments)    Hallucinations    Medications Prior to Admission  Medication Sig Dispense Refill  . aspirin EC 81 MG tablet Take 1 tablet (81 mg total) by mouth daily.    . Calcium Carbonate-Vitamin D (CALCIUM 600+D) 600-200 MG-UNIT TABS Take 1 tablet by mouth daily.     . citalopram (CELEXA) 10 MG tablet One daily to help depression and anxiety (Patient taking differently: Take 10 mg by mouth daily. to help depression and anxiety) 30 tablet 5  . Cobalamine Combinations (VITAMIN B12-FOLIC ACID) 5465-681MCG TABS Take 1 tablet by mouth daily with breakfast.    . hydrocortisone (PROCTO-MED HC) 2.5 % rectal cream Place 1 application rectally every 12 (twelve) hours as needed for hemorrhoids or itching.    .Marland Kitchen  ibuprofen (ADVIL,MOTRIN) 200 MG tablet Take 400 mg by mouth every 6 (six) hours as needed (for pain).    Marland Kitchen levothyroxine (SYNTHROID, LEVOTHROID) 25 MCG tablet Take 25 mcg by mouth daily before breakfast.    . LORazepam (ATIVAN) 0.5 MG tablet Take 0.5 mg by mouth every 8 (eight) hours as needed for anxiety.    . metoprolol tartrate (LOPRESSOR) 25 MG tablet Take 12.5 mg by mouth 2 (two) times  daily.    . mineral oil-hydrophilic petrolatum (AQUAPHOR) ointment Apply 1 application topically daily as needed for dry skin.    . polyethylene glycol (MIRALAX / GLYCOLAX) packet Take 17 g by mouth daily. 14 each 0  . risperiDONE (RISPERDAL) 0.5 MG tablet One at bedtime to prevent hallucinations and anxiety (Patient taking differently: Take 0.5 mg by mouth at bedtime. to prevent hallucinations and anxiety) 30 tablet 4  . vitamin B-12 (CYANOCOBALAMIN) 1000 MCG tablet Take 1,000 mcg by mouth daily.      Prior to Admission medications   Medication Sig Start Date End Date Taking? Authorizing Provider  aspirin EC 81 MG tablet Take 1 tablet (81 mg total) by mouth daily. 12/11/14  Yes Theodis Blaze, MD  Calcium Carbonate-Vitamin D (CALCIUM 600+D) 600-200 MG-UNIT TABS Take 1 tablet by mouth daily.    Yes Historical Provider, MD  citalopram (CELEXA) 10 MG tablet One daily to help depression and anxiety Patient taking differently: Take 10 mg by mouth daily. to help depression and anxiety 06/15/15  Yes Estill Dooms, MD  Cobalamine Combinations (VITAMIN B12-FOLIC ACID) 287-681 MCG TABS Take 1 tablet by mouth daily with breakfast.   Yes Historical Provider, MD  hydrocortisone (PROCTO-MED HC) 2.5 % rectal cream Place 1 application rectally every 12 (twelve) hours as needed for hemorrhoids or itching.   Yes Historical Provider, MD  ibuprofen (ADVIL,MOTRIN) 200 MG tablet Take 400 mg by mouth every 6 (six) hours as needed (for pain).   Yes Historical Provider, MD  levothyroxine (SYNTHROID, LEVOTHROID) 25 MCG tablet Take 25 mcg by mouth daily before breakfast.   Yes Historical Provider, MD  LORazepam (ATIVAN) 0.5 MG tablet Take 0.5 mg by mouth every 8 (eight) hours as needed for anxiety.   Yes Historical Provider, MD  metoprolol tartrate (LOPRESSOR) 25 MG tablet Take 12.5 mg by mouth 2 (two) times daily.   Yes Historical Provider, MD  mineral oil-hydrophilic petrolatum (AQUAPHOR) ointment Apply 1 application  topically daily as needed for dry skin.   Yes Historical Provider, MD  polyethylene glycol (MIRALAX / GLYCOLAX) packet Take 17 g by mouth daily. 01/24/16  Yes Eber Jones, MD  risperiDONE (RISPERDAL) 0.5 MG tablet One at bedtime to prevent hallucinations and anxiety Patient taking differently: Take 0.5 mg by mouth at bedtime. to prevent hallucinations and anxiety 06/22/15  Yes Estill Dooms, MD  vitamin B-12 (CYANOCOBALAMIN) 1000 MCG tablet Take 1,000 mcg by mouth daily.   Yes Historical Provider, MD    Blood pressure 150/80, pulse (!) 50, temperature 98.5 F (36.9 C), temperature source Oral, resp. rate 19, height 5' 2"  (1.575 m), weight 120 lb 12.8 oz (54.8 kg), SpO2 97 %.   Physical Exam: General: drowsy, frail appearing white female who is laying in bed in NAD HEENT: head is normocephalic, atraumatic.  Sclera are noninjected.   Mouth is dry Heart: regular, rate, and rhythm.  No obvious murmurs, gallops, or rubs noted.  Palpable pedal pulses bilaterally Lungs: CTAB, no wheezes, rhonchi, or rales noted.  Respiratory effort nonlabored. Ecchymosis  noted to right chest. Abd: well healed vertical incision distal to umbilicus; soft, NT/ND, +BS, no masses, hernias, or organomegaly MS: swelling and tenderness noted to right knee, patient unable to perform SLR at this time; steris cover superficial abrasion on distal/lateral lower leg; ACE bandage and KI in place Skin: warm and dry with no masses, lesions, or rashes Psych: demented, unable to assess at this time Neuro: unable to assess at this time  Results for orders placed or performed during the hospital encounter of 03/17/16 (from the past 48 hour(s))  CBC with Differential/Platelet     Status: Abnormal   Collection Time: 03/17/16  9:07 AM  Result Value Ref Range   WBC 10.0 4.0 - 10.5 K/uL   RBC 4.20 3.87 - 5.11 MIL/uL   Hemoglobin 13.8 12.0 - 15.0 g/dL   HCT 40.5 36.0 - 46.0 %   MCV 96.4 78.0 - 100.0 fL   MCH 32.9 26.0 - 34.0 pg    MCHC 34.1 30.0 - 36.0 g/dL   RDW 13.7 11.5 - 15.5 %   Platelets 337 150 - 400 K/uL   Neutrophils Relative % 68 %   Neutro Abs 6.8 1.7 - 7.7 K/uL   Lymphocytes Relative 19 %   Lymphs Abs 1.9 0.7 - 4.0 K/uL   Monocytes Relative 12 %   Monocytes Absolute 1.2 (H) 0.1 - 1.0 K/uL   Eosinophils Relative 1 %   Eosinophils Absolute 0.1 0.0 - 0.7 K/uL   Basophils Relative 0 %   Basophils Absolute 0.0 0.0 - 0.1 K/uL  Comprehensive metabolic panel     Status: Abnormal   Collection Time: 03/17/16  9:07 AM  Result Value Ref Range   Sodium 139 135 - 145 mmol/L   Potassium 3.3 (L) 3.5 - 5.1 mmol/L   Chloride 102 101 - 111 mmol/L   CO2 29 22 - 32 mmol/L   Glucose, Bld 97 65 - 99 mg/dL   BUN 17 6 - 20 mg/dL   Creatinine, Ser 0.62 0.44 - 1.00 mg/dL   Calcium 9.1 8.9 - 10.3 mg/dL   Total Protein 6.9 6.5 - 8.1 g/dL   Albumin 4.2 3.5 - 5.0 g/dL   AST 18 15 - 41 U/L   ALT 13 (L) 14 - 54 U/L   Alkaline Phosphatase 59 38 - 126 U/L   Total Bilirubin 1.1 0.3 - 1.2 mg/dL   GFR calc non Af Amer >60 >60 mL/min   GFR calc Af Amer >60 >60 mL/min    Comment: (NOTE) The eGFR has been calculated using the CKD EPI equation. This calculation has not been validated in all clinical situations. eGFR's persistently <60 mL/min signify possible Chronic Kidney Disease.    Anion gap 8 5 - 15  Protime-INR     Status: None   Collection Time: 03/17/16  9:07 AM  Result Value Ref Range   Prothrombin Time 12.5 11.4 - 15.2 seconds   INR 0.94   Type and screen     Status: None   Collection Time: 03/17/16  9:40 AM  Result Value Ref Range   ABO/RH(D) B POS    Antibody Screen NEG    Sample Expiration 03/20/2016    Dg Chest 2 View  Result Date: 03/17/2016 CLINICAL DATA:  Witnessed fall last night. EXAM: CHEST  2 VIEW COMPARISON:  01/21/2016 FINDINGS: Frontal view degraded by motion. There is left ventricular prominence. There is chronic aortic atherosclerosis. Right lung is clear. There is abnormal density in the  left lower  lobe that could be atelectasis and/or pneumonia. Compared to the previous study, there is slight further loss of height of a lower thoracic vertebral body. Old augmented compression fracture down lower appears unchanged. IMPRESSION: Left lower lobe atelectasis and/or pneumonia. Slight compression fracture in the mid to lower thoracic spine. Electronically Signed   By: Nelson Chimes M.D.   On: 03/17/2016 09:34   Ct Head Wo Contrast  Result Date: 03/17/2016 CLINICAL DATA:  Pain following fall.  Underlying dementia EXAM: CT HEAD WITHOUT CONTRAST CT CERVICAL SPINE WITHOUT CONTRAST TECHNIQUE: Multidetector CT imaging of the head and cervical spine was performed following the standard protocol without intravenous contrast. Multiplanar CT image reconstructions of the cervical spine were also generated. COMPARISON:  CT head January 21, 2016; CT cervical spine November 10, 2015 FINDINGS: CT HEAD FINDINGS Brain: Moderate diffuse atrophy is stable. There is no evident intracranial mass, hemorrhage, extra-axial fluid collection, or midline shift. There is small vessel disease throughout the centra semiovale bilaterally, stable. No new gray-white compartment lesion is evident. No acute infarct appreciable. Calcification along the tentorium bilaterally is stable. Vascular: No hyper dense vessels. There is extensive carotid siphon calcification bilaterally as well as calcification in both distal vertebral arteries. Skull: The bony calvarium is intact. Sinuses/Orbits: There is opacification in a posterior right ethmoid air cell. There is slight mucosal thickening in several ethmoid air cells elsewhere. Other visualized paranasal sinuses are clear. Visualized orbits appear symmetric bilaterally. Note that the patient has had cataract removal on the right, stable. Other: Mastoid air cells are clear CT CERVICAL SPINE FINDINGS Alignment: There is cervical dextroscoliosis. There is just over 3 mm of anterolisthesis of C3  on C4, slightly increased from prior study. There is just under 2 mm of anterolisthesis of C4 on C5, stable. No spondylolisthesis is evident elsewhere. Skull base and vertebrae: Skull base and craniocervical junction regions appear normal. There is moderate pannus posterior to the odontoid which is not causing appreciable impression on the craniocervical junction. There is no evident fracture. There are no blastic or lytic bone lesions. Soft tissues and spinal canal: The prevertebral soft tissues and predental space regions are normal. No paraspinous lesions are evident. There is no cord or canal hematoma evident. No spinal stenosis appreciable. Disc levels: There is marked disc space narrowing at C4-5. There is moderately severe disc space narrowing at C5-6 and C6-7. There is moderate disc space narrowing at C3-4 and C7-T1. There is facet hypertrophy at most levels bilaterally. There is no frank disc extrusion. Upper chest: There is aortic atherosclerosis. There is mild scarring in the lung apices superiorly. Other: There is calcification in both carotid arteries. There are multiple small nodular lesions in the thyroid without dominant thyroid mass evident. IMPRESSION: CT head: Stable atrophy with extensive supratentorial small vessel disease. No acute infarct evident. No mass, hemorrhage, or extra-axial fluid collection. Multiple foci of arteriovascular calcification present. There is ethmoid sinus disease, most notably in a posterior right ethmoid air cell, where there is opacification of much of this air cell. CT cervical spine: No fracture. Spondylolisthesis at C3-4 and C4-5. Note that there is slightly more anterolisthesis at C3-4 than on prior study. Spondylolisthesis at C4-5 stable. The spondylolisthesis at these levels is felt to be due to spondylosis underlying. There is multifocal arthropathy. No frank disc extrusion or high-grade stenosis. Aortic atherosclerosis. Bilateral carotid artery calcification.  Multinodular goiter without dominant thyroid mass. Electronically Signed   By: Lowella Grip III M.D.   On: 03/17/2016 09:44  Ct Chest W Contrast  Result Date: 03/17/2016 CLINICAL DATA:  Patient status post fall. Evaluate for traumatic injury. EXAM: CT CHEST, ABDOMEN, AND PELVIS WITH CONTRAST TECHNIQUE: Multidetector CT imaging of the chest, abdomen and pelvis was performed following the standard protocol during bolus administration of intravenous contrast. CONTRAST:  40m ISOVUE-300 IOPAMIDOL (ISOVUE-300) INJECTION 61% COMPARISON:  MR pelvis 01/04/2010 FINDINGS: CT CHEST FINDINGS Cardiovascular: Heart is enlarged. No pericardial effusion. Peripheral atherosclerotic plaque involving the thoracic aorta. 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 The aorta measures up to 3.4 cm at this location (image 27; series 2). Mediastinum/Nodes: No enlarged axillary, mediastinal or hilar lymphadenopathy. Lungs/Pleura: Central airways are patent. Dependent atelectasis within the bilateral lower lobes. Calcified granuloma within the left upper lobe. 4 mm right middle lobe nodule (image 74; series 4). No pleural effusion or pneumothorax. Musculoskeletal: Mildly displaced fracture through the anterior left second and third ribs (image 47; series 4) (image 70; series 4). Nondisplaced posterior left ninth, twelfth and eleventh rib fractures. Chronic appearing T12 compression deformity with kyphoplasty material. Narrowing of the spinal canal at this level. Nondisplaced right tenth rib fracture. CT ABDOMEN PELVIS FINDINGS Hepatobiliary: Liver is normal in size and contour. Gallbladder is unremarkable. No intrahepatic or extrahepatic biliary ductal dilatation. Pancreas: Unremarkable Spleen: Unremarkable Adrenals/Urinary Tract: The adrenal glands are normal. Kidneys enhance symmetrically with contrast. There is a 1.7 x 1.8 cm exophytic lesion off the interpolar region of the left kidney within internal  density of 49 Hounsfield units (image 58; series 2). Urinary bladder is dilated. Stomach/Bowel: Pancolonic diverticulosis. No CT evidence for acute diverticulitis. No free fluid or free intraperitoneal air. No evidence for bowel obstruction. Vascular/Lymphatic: Peripheral calcified atherosclerotic plaque involving the abdominal aorta. No retroperitoneal lymphadenopathy. Reproductive: There is a large partially calcified right adnexal mass and associated cyst. Cyst measures up to 4 cm stone in dimension. Sizes this mass appears similar to prior MRI 01/04/2010. Other: None. Musculoskeletal: Old left inferior pubic ramus fracture. Osseous demineralization. Lumbar spine degenerative changes. No aggressive or acute appearing osseous lesions. IMPRESSION: Left anterior second and third mildly displaced rib fractures. Nondisplaced posterior left ninth, eleventh and twelfth rib fractures. Nondisplaced right tenth rib fracture. Chronic appearing T12 compression deformity with kyphoplasty material. There is narrowing of the spinal canal at this level. Otherwise no acute process within the chest, abdomen or pelvis. Indeterminate dense exophytic mass off the interpolar region of the left kidney. This may represent a complicated cyst or solid renal mass. Recommend dedicated evaluation with pre and post contrast-enhanced MRI or CT in the non acute setting. 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 Given the patient's age, this is likely of doubtful clinical significance however vascular consultation can be performed as clinically indicated. Indeterminate calcified and cystic mass within the right adnexa. Grossly this appears similar in size when compared to prior MRI exams. This is nonspecific in etiology however may be benign given stability over time. Pancolonic diverticulosis. Electronically Signed   By: DLovey NewcomerM.D.   On: 03/17/2016 11:34   Ct Cervical  Spine Wo Contrast  Result Date: 03/17/2016 CLINICAL DATA:  Pain following fall.  Underlying dementia EXAM: CT HEAD WITHOUT CONTRAST CT CERVICAL SPINE WITHOUT CONTRAST TECHNIQUE: Multidetector CT imaging of the head and cervical spine was performed following the standard protocol without intravenous contrast. Multiplanar CT image reconstructions of the cervical spine were also generated. COMPARISON:  CT head January 21, 2016; CT cervical spine November 10, 2015 FINDINGS: CT HEAD FINDINGS Brain: Moderate diffuse atrophy is stable. There is no evident intracranial mass, hemorrhage, extra-axial fluid collection, or midline shift. There is small vessel disease throughout the centra semiovale bilaterally, stable. No new gray-white compartment lesion is evident. No acute infarct appreciable. Calcification along the tentorium bilaterally is stable. Vascular: No hyper dense vessels. There is extensive carotid siphon calcification bilaterally as well as calcification in both distal vertebral arteries. Skull: The bony calvarium is intact. Sinuses/Orbits: There is opacification in a posterior right ethmoid air cell. There is slight mucosal thickening in several ethmoid air cells elsewhere. Other visualized paranasal sinuses are clear. Visualized orbits appear symmetric bilaterally. Note that the patient has had cataract removal on the right, stable. Other: Mastoid air cells are clear CT CERVICAL SPINE FINDINGS Alignment: There is cervical dextroscoliosis. There is just over 3 mm of anterolisthesis of C3 on C4, slightly increased from prior study. There is just under 2 mm of anterolisthesis of C4 on C5, stable. No spondylolisthesis is evident elsewhere. Skull base and vertebrae: Skull base and craniocervical junction regions appear normal. There is moderate pannus posterior to the odontoid which is not causing appreciable impression on the craniocervical junction. There is no evident fracture. There are no blastic or lytic  bone lesions. Soft tissues and spinal canal: The prevertebral soft tissues and predental space regions are normal. No paraspinous lesions are evident. There is no cord or canal hematoma evident. No spinal stenosis appreciable. Disc levels: There is marked disc space narrowing at C4-5. There is moderately severe disc space narrowing at C5-6 and C6-7. There is moderate disc space narrowing at C3-4 and C7-T1. There is facet hypertrophy at most levels bilaterally. There is no frank disc extrusion. Upper chest: There is aortic atherosclerosis. There is mild scarring in the lung apices superiorly. Other: There is calcification in both carotid arteries. There are multiple small nodular lesions in the thyroid without dominant thyroid mass evident. IMPRESSION: CT head: Stable atrophy with extensive supratentorial small vessel disease. No acute infarct evident. No mass, hemorrhage, or extra-axial fluid collection. Multiple foci of arteriovascular calcification present. There is ethmoid sinus disease, most notably in a posterior right ethmoid air cell, where there is opacification of much of this air cell. CT cervical spine: No fracture. Spondylolisthesis at C3-4 and C4-5. Note that there is slightly more anterolisthesis at C3-4 than on prior study. Spondylolisthesis at C4-5 stable. The spondylolisthesis at these levels is felt to be due to spondylosis underlying. There is multifocal arthropathy. No frank disc extrusion or high-grade stenosis. Aortic atherosclerosis. Bilateral carotid artery calcification. Multinodular goiter without dominant thyroid mass. Electronically Signed   By: Lowella Grip III M.D.   On: 03/17/2016 09:44   Ct Thoracic Spine Wo Contrast  Result Date: 03/17/2016 CLINICAL DATA:  Back pain, fell last night, dementia EXAM: CT THORACIC AND LUMBAR SPINE WITHOUT CONTRAST TECHNIQUE: Multidetector CT imaging of the thoracic and lumbar spine was performed without contrast. Multiplanar CT image  reconstructions were also generated. COMPARISON:  Chest radiograph 01/21/2016, MR pelvis 01/04/2010 FINDINGS: CT THORACIC SPINE FINDINGS Alignment: 12 rib pairs. Mild dextroconvex thoracic scoliosis. Otherwise normal. Vertebrae: Marked osseous demineralization. Chronic marked compression fracture of T12 vertebral body with retropulsion of a posterior fragment, significant AP narrowing of the spinal canal, and evidence of spinal augmentation procedure. Remaining vertebral body heights maintained. Few scattered endplate spurs and mild disc space narrowing at mid thoracic levels. No acute thoracic spine fracture or subluxation Paraspinal and other soft tissues: Extensive atherosclerotic  calcification aorta with aneurysmal dilatation of the descending thoracic aorta to 3.2 x 3.5 cm image 68. Coronary arterial calcifications. Heart appears enlarged. No definite thoracic adenopathy. Dependent atelectasis in both lungs. Calcified granuloma LEFT upper lobe. Disc levels: Grossly unremarkable. CT LUMBAR SPINE FINDINGS Segmentation: 5 non-rib-bearing lumbar type vertebra. Alignment: Normal Vertebrae: Marked osseous demineralization. Vertebral body heights maintained without acute fracture or subluxation. Multilevel facet degenerative changes. SI joints appear symmetric and preserved. No obvious sacral fracture. Paraspinal and other soft tissues: Extensive atherosclerotic calcifications abdominal aorta and iliac arteries. Partially calcified mass in the RIGHT pelvis, 6.9 x 5.3 x 8.2 cm, uncertain if arising from uterus or adnexa diet present since and grossly unchanged since an MRI of the pelvis from 2011. Disc levels: Diffusely narrowed.  No gross disc herniation. IMPRESSION: CT THORACIC SPINE IMPRESSION Marked osseous demineralization. Old T12 compression fracture and vertebroplasty with stable retropulsion of a dominant fragment and AP narrowing of the spinal canal. No acute thoracic spine abnormalities. CT LUMBAR SPINE  IMPRESSION Osseous demineralization with degenerative disc and facet disease changes lumbar spine. No acute lumbar spine abnormalities. Partially calcified 6.9 x 5.3 x 8.2 cm diameter mass within the RIGHT pelvis, present since 2011, either of RIGHT ovarian or less likely uterine origin, favor benign in light of time this has been present and similar in appearance. Extensive aortic atherosclerosis with mild aneurysmal dilatation of the descending thoracic aorta up to 3.2 x 3.5 cm. Coronary arterial calcifications. Electronically Signed   By: Lavonia Dana M.D.   On: 03/17/2016 11:51   Ct Lumbar Spine Wo Contrast  Result Date: 03/17/2016 CLINICAL DATA:  Back pain, fell last night, dementia EXAM: CT THORACIC AND LUMBAR SPINE WITHOUT CONTRAST TECHNIQUE: Multidetector CT imaging of the thoracic and lumbar spine was performed without contrast. Multiplanar CT image reconstructions were also generated. COMPARISON:  Chest radiograph 01/21/2016, MR pelvis 01/04/2010 FINDINGS: CT THORACIC SPINE FINDINGS Alignment: 12 rib pairs. Mild dextroconvex thoracic scoliosis. Otherwise normal. Vertebrae: Marked osseous demineralization. Chronic marked compression fracture of T12 vertebral body with retropulsion of a posterior fragment, significant AP narrowing of the spinal canal, and evidence of spinal augmentation procedure. Remaining vertebral body heights maintained. Few scattered endplate spurs and mild disc space narrowing at mid thoracic levels. No acute thoracic spine fracture or subluxation Paraspinal and other soft tissues: Extensive atherosclerotic calcification aorta with aneurysmal dilatation of the descending thoracic aorta to 3.2 x 3.5 cm image 68. Coronary arterial calcifications. Heart appears enlarged. No definite thoracic adenopathy. Dependent atelectasis in both lungs. Calcified granuloma LEFT upper lobe. Disc levels: Grossly unremarkable. CT LUMBAR SPINE FINDINGS Segmentation: 5 non-rib-bearing lumbar type  vertebra. Alignment: Normal Vertebrae: Marked osseous demineralization. Vertebral body heights maintained without acute fracture or subluxation. Multilevel facet degenerative changes. SI joints appear symmetric and preserved. No obvious sacral fracture. Paraspinal and other soft tissues: Extensive atherosclerotic calcifications abdominal aorta and iliac arteries. Partially calcified mass in the RIGHT pelvis, 6.9 x 5.3 x 8.2 cm, uncertain if arising from uterus or adnexa diet present since and grossly unchanged since an MRI of the pelvis from 2011. Disc levels: Diffusely narrowed.  No gross disc herniation. IMPRESSION: CT THORACIC SPINE IMPRESSION Marked osseous demineralization. Old T12 compression fracture and vertebroplasty with stable retropulsion of a dominant fragment and AP narrowing of the spinal canal. No acute thoracic spine abnormalities. CT LUMBAR SPINE IMPRESSION Osseous demineralization with degenerative disc and facet disease changes lumbar spine. No acute lumbar spine abnormalities. Partially calcified 6.9 x 5.3 x 8.2 cm diameter mass  within the RIGHT pelvis, present since 2011, either of RIGHT ovarian or less likely uterine origin, favor benign in light of time this has been present and similar in appearance. Extensive aortic atherosclerosis with mild aneurysmal dilatation of the descending thoracic aorta up to 3.2 x 3.5 cm. Coronary arterial calcifications. Electronically Signed   By: Lavonia Dana M.D.   On: 03/17/2016 11:51   Ct Abdomen Pelvis W Contrast  Result Date: 03/17/2016 CLINICAL DATA:  Patient status post fall. Evaluate for traumatic injury. EXAM: CT CHEST, ABDOMEN, AND PELVIS WITH CONTRAST TECHNIQUE: Multidetector CT imaging of the chest, abdomen and pelvis was performed following the standard protocol during bolus administration of intravenous contrast. CONTRAST:  73m ISOVUE-300 IOPAMIDOL (ISOVUE-300) INJECTION 61% COMPARISON:  MR pelvis 01/04/2010 FINDINGS: CT CHEST FINDINGS  Cardiovascular: Heart is enlarged. No pericardial effusion. Peripheral atherosclerotic plaque involving the thoracic aorta. 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 The aorta measures up to 3.4 cm at this location (image 27; series 2). Mediastinum/Nodes: No enlarged axillary, mediastinal or hilar lymphadenopathy. Lungs/Pleura: Central airways are patent. Dependent atelectasis within the bilateral lower lobes. Calcified granuloma within the left upper lobe. 4 mm right middle lobe nodule (image 74; series 4). No pleural effusion or pneumothorax. Musculoskeletal: Mildly displaced fracture through the anterior left second and third ribs (image 47; series 4) (image 70; series 4). Nondisplaced posterior left ninth, twelfth and eleventh rib fractures. Chronic appearing T12 compression deformity with kyphoplasty material. Narrowing of the spinal canal at this level. Nondisplaced right tenth rib fracture. CT ABDOMEN PELVIS FINDINGS Hepatobiliary: Liver is normal in size and contour. Gallbladder is unremarkable. No intrahepatic or extrahepatic biliary ductal dilatation. Pancreas: Unremarkable Spleen: Unremarkable Adrenals/Urinary Tract: The adrenal glands are normal. Kidneys enhance symmetrically with contrast. There is a 1.7 x 1.8 cm exophytic lesion off the interpolar region of the left kidney within internal density of 49 Hounsfield units (image 58; series 2). Urinary bladder is dilated. Stomach/Bowel: Pancolonic diverticulosis. No CT evidence for acute diverticulitis. No free fluid or free intraperitoneal air. No evidence for bowel obstruction. Vascular/Lymphatic: Peripheral calcified atherosclerotic plaque involving the abdominal aorta. No retroperitoneal lymphadenopathy. Reproductive: There is a large partially calcified right adnexal mass and associated cyst. Cyst measures up to 4 cm stone in dimension. Sizes this mass appears similar to prior MRI 01/04/2010. Other: None. Musculoskeletal:  Old left inferior pubic ramus fracture. Osseous demineralization. Lumbar spine degenerative changes. No aggressive or acute appearing osseous lesions. IMPRESSION: Left anterior second and third mildly displaced rib fractures. Nondisplaced posterior left ninth, eleventh and twelfth rib fractures. Nondisplaced right tenth rib fracture. Chronic appearing T12 compression deformity with kyphoplasty material. There is narrowing of the spinal canal at this level. Otherwise no acute process within the chest, abdomen or pelvis. Indeterminate dense exophytic mass off the interpolar region of the left kidney. This may represent a complicated cyst or solid renal mass. Recommend dedicated evaluation with pre and post contrast-enhanced MRI or CT in the non acute setting. 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 Given the patient's age, this is likely of doubtful clinical significance however vascular consultation can be performed as clinically indicated. Indeterminate calcified and cystic mass within the right adnexa. Grossly this appears similar in size when compared to prior MRI exams. This is nonspecific in etiology however may be benign given stability over time. Pancolonic diverticulosis. Electronically Signed   By: DLovey NewcomerM.D.   On: 03/17/2016 11:34   Dg  Knee Complete 4 Views Right  Result Date: 03/17/2016 CLINICAL DATA:  Witnessed fall last night.  Pain. EXAM: RIGHT KNEE - COMPLETE 4+ VIEW COMPARISON:  None. FINDINGS: Complete transverse fracture of the patella, displaced a few mm with slight offset. No fracture of the femur, tibia or fibula. Small lipoma hemarthrosis. IMPRESSION: Complete transverse fracture of the mid patella. Electronically Signed   By: Nelson Chimes M.D.   On: 03/17/2016 09:37   Dg Hip Unilat With Pelvis 2-3 Views Right  Result Date: 03/17/2016 CLINICAL DATA:  Witnessed fall last night. EXAM: DG HIP (WITH OR WITHOUT PELVIS)  2-3V RIGHT COMPARISON:  None. FINDINGS: No evidence of pelvic or hip fracture. Extensive pelvic calcifications presumed represent chronic benign uterine disease. Old healed rami fractures on the left. IMPRESSION: No acute finding. Electronically Signed   By: Nelson Chimes M.D.   On: 03/17/2016 09:35   Dg Femur, Min 2 Views Right  Result Date: 03/17/2016 CLINICAL DATA:  Witnessed fall last night.  Pain. EXAM: RIGHT FEMUR 2 VIEWS COMPARISON:  None. FINDINGS: There is no evidence of fracture or other focal bone lesions. Soft tissues are unremarkable. IMPRESSION: Negative. Electronically Signed   By: Nelson Chimes M.D.   On: 03/17/2016 09:36      Assessment/Plan Ground level fall Multiple bilateral rib fractures, 2/03/31/09/12 on the left and 10th on the right Right patella fracture - ortho to consult, in [place] for now Subacute T12 compression fracture Mass in right pelvis, stable since 2011 Hypokalemia - mild, 3.3. monitor  Dementia HTN - metoprolol Hypothyroid - syndthroid Anxiety/depression - celexa SNF resident  ID - none VTE - SCDs FEN - IVF, dysphagia 3 diet  Plan - Discussed with Dr. Grandville Silos trauma MD at [facility]. Since she is stable, no PNX, and no SOB we will wait on transfer to [facility] at this time.   Recommend admit to medicine for multiple medical problems. Pulmonary toilet/IS and pain control for rib fractures.   Will consult PT/OT to maximize mobility. Ortho to see for patella fracture, WB status per ortho. CBC/BMP in AM.  Jerrye Beavers, [facility] 03/17/2016, 2:43 PM Pager: [phone] Consults: [phone]  Agree with above. Normally a patient with this diagnosis would be at [facility] with a Trauma Service consult. We can follow the patient from here for now.  Alphonsa Overall, MD, [facility] Surgery Pager: [phone] Office phone:  [phone]

## 2016-03-17 NOTE — ED Notes (Signed)
RN updated Pts daughter and Charissa Bash with Pts room number

## 2016-03-17 NOTE — ED Notes (Signed)
Patient transported to X-ray 

## 2016-03-17 NOTE — ED Notes (Signed)
Patient transported to CT 

## 2016-03-17 NOTE — Progress Notes (Signed)
Pt admitted on unit and has a skin tear as it looks to be cleaned with betadine and has steri strips on it from ED. approxately measure is 3.5cm length by 2.5cm wide. No dressing is covering it.

## 2016-03-17 NOTE — ED Notes (Signed)
Glenford Peers (daughter)  [phone]

## 2016-03-17 NOTE — ED Triage Notes (Addendum)
Pt brought to ED from [facility]. She has a history of dementia and is nonverbal. Per ems family and SNF staff reports patient will maintain eye contact but does not respond. Will follow commands. Pt was ambulating last night and trip and fell. X-ray done at facility last night confirmed right patellar fracture. Patient was going to be seen today at provider's office however facility was advised to seek ED attention instead. Pt is HOH. VSS 180/76, 56, 16, 94% RA. Pt is alone but family is on the way. Pt is DNR and out of facility form is at bedside.

## 2016-03-17 NOTE — H&P (Signed)
History and Physical  [name] [id number] DOB: 01-12-1918 DOA: 03/17/2016  Referring physician: Shirlyn Goltz, ER physician  PCP: Jeanmarie Hubert, MD  Outpatient Specialists: None Patient coming from: Nursing home & is able to ambulate using a walker, however she is noncompliant with this and frequently falls reportedly  Chief Complaint: Falls   HPI: Please note, patient with advanced chronic dementia. History is obtained from ER physician, medical records.   [name] is a 83 y.o. female with medical history significant of dementia and hypertension who resides in a skilled nursing facility and reportedly sustained a mechanical fall 1 day prior on 2/22. This was not witnessed. There was no reported head injury. Patient had x-rays done at the facility which then came back and noted a right patellar fracture. Patient was then sent up to the emergency room for further evaluation.  ED Course: Today, in the emergency room, x-ray of the right knee noted a complete transverse fracture of the mid patella. Patient also underwent a full set of CT films for evaluation of her trauma. She is found to have a chronic calcified mass in her pelvis which was unchanged. She was noted to have an incidental left renal lesion. She was noted to have a saccular aneurysm or descending drastically aorta which was approximately 3.4 cm. Patient also noted to have a chronic T12 compression fracture. We'll concerning for multiple displaced fractures of the left second and third as well as posterior left ninth and 11th and 12th and right 10th. With the findings for her knee and multiple rib fractures, orthopedic and trauma surgery were consulted. With the surgery recommended a knee immobilizer, or unclear given her advanced age and advanced dementia surgery would be appropriate. Trauma surgery had some recommendations, but felt that most she would need overnight observation. Given her advanced age, dementia and multiple  surgical issues spanning multiple services, hospitalists were consulted for admission.  Review of Systems: Patient seen after arrival to floor. With her chronic dementia, she is only oriented to her name. She does not answer any questions for me area I'm unable to get any kind of review of systems from her.   Past Medical History:  Diagnosis Date  . Abnormality of gait 03/15/2010  . Anxiety state, unspecified 03/15/2010  . Depression, major 06/15/2015  . Depressive disorder, not elsewhere classified 03/15/2010  . Dysphagia, unspecified(787.20) 03/15/2010  . Generalized anxiety disorder 06/23/2014  . Herpes zoster without mention of complication [id number]  . Other alteration of consciousness 03/15/2010  . Pain in hand 08/13/12  . Personal history of fall 03/15/2010  . Senile osteoporosis 03/15/2010  . Unspecified constipation 03/15/2010  . Unspecified essential hypertension 03/15/2010  . Unspecified hearing loss 08/22/2011  . Unspecified urinary incontinence 03/15/2010  . Urinary incontinence 03/15/2010  . Vaginal prolapse 06/15/2015   Past Surgical History:  Procedure Laterality Date  . INCISION / DRAINAGE HAND / FINGER Right 06/2008   hand  . SPINE SURGERY  12/2006    Social History:  reports that she has never smoked. She has never used smokeless tobacco. She reports that she drinks alcohol. She reports that she does not use drugs. Patient is from a nursing facility. She reportedly uses a walker, however she is not always compliant with this and frequently falls. Her social history is obtained from records  Allergies  Allergen Reactions  . Ace Inhibitors Other (See Comments)    Reaction unknown/ listed on MAR  . Hydrochlorothiazide Other (See Comments)  Reaction unknown/ listed on MAR  . Morphine And Related Other (See Comments)    Hallucinations    Family History  Problem Relation Age of Onset  . Heart disease Father     MI      Prior to Admission medications   Medication  Sig Start Date End Date Taking? Authorizing Provider  aspirin EC 81 MG tablet Take 1 tablet (81 mg total) by mouth daily. 12/11/14  Yes Theodis Blaze, MD  Calcium Carbonate-Vitamin D (CALCIUM 600+D) 600-200 MG-UNIT TABS Take 1 tablet by mouth daily.    Yes Historical Provider, MD  citalopram (CELEXA) 10 MG tablet One daily to help depression and anxiety Patient taking differently: Take 10 mg by mouth daily. to help depression and anxiety 06/15/15  Yes Estill Dooms, MD  Cobalamine Combinations (VITAMIN B12-FOLIC ACID) XX123456 MCG TABS Take 1 tablet by mouth daily with breakfast.   Yes Historical Provider, MD  hydrocortisone (PROCTO-MED HC) 2.5 % rectal cream Place 1 application rectally every 12 (twelve) hours as needed for hemorrhoids or itching.   Yes Historical Provider, MD  ibuprofen (ADVIL,MOTRIN) 200 MG tablet Take 400 mg by mouth every 6 (six) hours as needed (for pain).   Yes Historical Provider, MD  levothyroxine (SYNTHROID, LEVOTHROID) 25 MCG tablet Take 25 mcg by mouth daily before breakfast.   Yes Historical Provider, MD  LORazepam (ATIVAN) 0.5 MG tablet Take 0.5 mg by mouth every 8 (eight) hours as needed for anxiety.   Yes Historical Provider, MD  metoprolol tartrate (LOPRESSOR) 25 MG tablet Take 12.5 mg by mouth 2 (two) times daily.   Yes Historical Provider, MD  mineral oil-hydrophilic petrolatum (AQUAPHOR) ointment Apply 1 application topically daily as needed for dry skin.   Yes Historical Provider, MD  polyethylene glycol (MIRALAX / GLYCOLAX) packet Take 17 g by mouth daily. 01/24/16  Yes Eber Jones, MD  risperiDONE (RISPERDAL) 0.5 MG tablet One at bedtime to prevent hallucinations and anxiety Patient taking differently: Take 0.5 mg by mouth at bedtime. to prevent hallucinations and anxiety 06/22/15  Yes Estill Dooms, MD  vitamin B-12 (CYANOCOBALAMIN) 1000 MCG tablet Take 1,000 mcg by mouth daily.   Yes Historical Provider, MD    Physical Exam: BP 150/80   Pulse (!) 50    Temp 98.5 F (36.9 C) (Oral)   Resp 19   Ht 5\' 2"  (1.575 m)   Wt 54.8 kg (120 lb 12.8 oz)   SpO2 97%   BMI 22.09 kg/m   Gen.: Awake, oriented 1, appears in no acute distress HEENT: Sclera nonicteric, extraocular movements appear intact, mucous membranes are dry, normocephalic Neck: Supple, no JVD Cardiovascular: Regular rate and rhythm, S1-S2, borderline bradycardia Lungs: Patient not able to follow commands for deep inspiratory effort, otherwise clear abdomen: Soft, nondistended, hypoactive bowel sounds Musculoskeletal: No clubbing or cyanosis or edema  skin: Left-sided torso notes multiple bruises consistent with her recent fall, no skin breaks Psychiatry: Chronic dementia, no evidence however of acute psychosis Neuro: Appears nonfocal, however patient is not able to follow commands         Labs on Admission:  Basic Metabolic Panel:  Recent Labs Lab 03/17/16 0907  NA 139  K 3.3*  CL 102  CO2 29  GLUCOSE 97  BUN 17  CREATININE 0.62  CALCIUM 9.1   Liver Function Tests:  Recent Labs Lab 03/17/16 0907  AST 18  ALT 13*  ALKPHOS 59  BILITOT 1.1  PROT 6.9  ALBUMIN 4.2  No results for input(s): LIPASE, AMYLASE in the last 168 hours. No results for input(s): AMMONIA in the last 168 hours. CBC:  Recent Labs Lab 03/17/16 0907  WBC 10.0  NEUTROABS 6.8  HGB 13.8  HCT 40.5  MCV 96.4  PLT 337   Cardiac Enzymes: No results for input(s): CKTOTAL, CKMB, CKMBINDEX, TROPONINI in the last 168 hours.  BNP (last 3 results) No results for input(s): BNP in the last 8760 hours.  ProBNP (last 3 results) No results for input(s): PROBNP in the last 8760 hours.  CBG: No results for input(s): GLUCAP in the last 168 hours.  Radiological Exams on Admission: Dg Chest 2 View  Result Date: 03/17/2016 CLINICAL DATA:  Witnessed fall last night. EXAM: CHEST  2 VIEW COMPARISON:  01/21/2016 FINDINGS: Frontal view degraded by motion. There is left ventricular prominence.  There is chronic aortic atherosclerosis. Right lung is clear. There is abnormal density in the left lower lobe that could be atelectasis and/or pneumonia. Compared to the previous study, there is slight further loss of height of a lower thoracic vertebral body. Old augmented compression fracture down lower appears unchanged. IMPRESSION: Left lower lobe atelectasis and/or pneumonia. Slight compression fracture in the mid to lower thoracic spine. Electronically Signed   By: Nelson Chimes M.D.   On: 03/17/2016 09:34   Ct Head Wo Contrast  Result Date: 03/17/2016 CLINICAL DATA:  Pain following fall.  Underlying dementia EXAM: CT HEAD WITHOUT CONTRAST CT CERVICAL SPINE WITHOUT CONTRAST TECHNIQUE: Multidetector CT imaging of the head and cervical spine was performed following the standard protocol without intravenous contrast. Multiplanar CT image reconstructions of the cervical spine were also generated. COMPARISON:  CT head January 21, 2016; CT cervical spine November 10, 2015 FINDINGS: CT HEAD FINDINGS Brain: Moderate diffuse atrophy is stable. There is no evident intracranial mass, hemorrhage, extra-axial fluid collection, or midline shift. There is small vessel disease throughout the centra semiovale bilaterally, stable. No new gray-white compartment lesion is evident. No acute infarct appreciable. Calcification along the tentorium bilaterally is stable. Vascular: No hyper dense vessels. There is extensive carotid siphon calcification bilaterally as well as calcification in both distal vertebral arteries. Skull: The bony calvarium is intact. Sinuses/Orbits: There is opacification in a posterior right ethmoid air cell. There is slight mucosal thickening in several ethmoid air cells elsewhere. Other visualized paranasal sinuses are clear. Visualized orbits appear symmetric bilaterally. Note that the patient has had cataract removal on the right, stable. Other: Mastoid air cells are clear CT CERVICAL SPINE FINDINGS  Alignment: There is cervical dextroscoliosis. There is just over 3 mm of anterolisthesis of C3 on C4, slightly increased from prior study. There is just under 2 mm of anterolisthesis of C4 on C5, stable. No spondylolisthesis is evident elsewhere. Skull base and vertebrae: Skull base and craniocervical junction regions appear normal. There is moderate pannus posterior to the odontoid which is not causing appreciable impression on the craniocervical junction. There is no evident fracture. There are no blastic or lytic bone lesions. Soft tissues and spinal canal: The prevertebral soft tissues and predental space regions are normal. No paraspinous lesions are evident. There is no cord or canal hematoma evident. No spinal stenosis appreciable. Disc levels: There is marked disc space narrowing at C4-5. There is moderately severe disc space narrowing at C5-6 and C6-7. There is moderate disc space narrowing at C3-4 and C7-T1. There is facet hypertrophy at most levels bilaterally. There is no frank disc extrusion. Upper chest: There is aortic atherosclerosis. There  is mild scarring in the lung apices superiorly. Other: There is calcification in both carotid arteries. There are multiple small nodular lesions in the thyroid without dominant thyroid mass evident. IMPRESSION: CT head: Stable atrophy with extensive supratentorial small vessel disease. No acute infarct evident. No mass, hemorrhage, or extra-axial fluid collection. Multiple foci of arteriovascular calcification present. There is ethmoid sinus disease, most notably in a posterior right ethmoid air cell, where there is opacification of much of this air cell. CT cervical spine: No fracture. Spondylolisthesis at C3-4 and C4-5. Note that there is slightly more anterolisthesis at C3-4 than on prior study. Spondylolisthesis at C4-5 stable. The spondylolisthesis at these levels is felt to be due to spondylosis underlying. There is multifocal arthropathy. No frank disc  extrusion or high-grade stenosis. Aortic atherosclerosis. Bilateral carotid artery calcification. Multinodular goiter without dominant thyroid mass. Electronically Signed   By: Lowella Grip III M.D.   On: 03/17/2016 09:44   Ct Chest W Contrast  Result Date: 03/17/2016 CLINICAL DATA:  Patient status post fall. Evaluate for traumatic injury. EXAM: CT CHEST, ABDOMEN, AND PELVIS WITH CONTRAST TECHNIQUE: Multidetector CT imaging of the chest, abdomen and pelvis was performed following the standard protocol during bolus administration of intravenous contrast. CONTRAST:  58mL ISOVUE-300 IOPAMIDOL (ISOVUE-300) INJECTION 61% COMPARISON:  MR pelvis 01/04/2010 FINDINGS: CT CHEST FINDINGS Cardiovascular: Heart is enlarged. No pericardial effusion. Peripheral atherosclerotic plaque involving the thoracic aorta. 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 The aorta measures up to 3.4 cm at this location (image 27; series 2). Mediastinum/Nodes: No enlarged axillary, mediastinal or hilar lymphadenopathy. Lungs/Pleura: Central airways are patent. Dependent atelectasis within the bilateral lower lobes. Calcified granuloma within the left upper lobe. 4 mm right middle lobe nodule (image 74; series 4). No pleural effusion or pneumothorax. Musculoskeletal: Mildly displaced fracture through the anterior left second and third ribs (image 47; series 4) (image 70; series 4). Nondisplaced posterior left ninth, twelfth and eleventh rib fractures. Chronic appearing T12 compression deformity with kyphoplasty material. Narrowing of the spinal canal at this level. Nondisplaced right tenth rib fracture. CT ABDOMEN PELVIS FINDINGS Hepatobiliary: Liver is normal in size and contour. Gallbladder is unremarkable. No intrahepatic or extrahepatic biliary ductal dilatation. Pancreas: Unremarkable Spleen: Unremarkable Adrenals/Urinary Tract: The adrenal glands are normal. Kidneys enhance symmetrically with contrast. There  is a 1.7 x 1.8 cm exophytic lesion off the interpolar region of the left kidney within internal density of 49 Hounsfield units (image 58; series 2). Urinary bladder is dilated. Stomach/Bowel: Pancolonic diverticulosis. No CT evidence for acute diverticulitis. No free fluid or free intraperitoneal air. No evidence for bowel obstruction. Vascular/Lymphatic: Peripheral calcified atherosclerotic plaque involving the abdominal aorta. No retroperitoneal lymphadenopathy. Reproductive: There is a large partially calcified right adnexal mass and associated cyst. Cyst measures up to 4 cm stone in dimension. Sizes this mass appears similar to prior MRI 01/04/2010. Other: None. Musculoskeletal: Old left inferior pubic ramus fracture. Osseous demineralization. Lumbar spine degenerative changes. No aggressive or acute appearing osseous lesions. IMPRESSION: Left anterior second and third mildly displaced rib fractures. Nondisplaced posterior left ninth, eleventh and twelfth rib fractures. Nondisplaced right tenth rib fracture. Chronic appearing T12 compression deformity with kyphoplasty material. There is narrowing of the spinal canal at this level. Otherwise no acute process within the chest, abdomen or pelvis. Indeterminate dense exophytic mass off the interpolar region of the left kidney. This may represent a complicated cyst or solid renal mass. Recommend dedicated evaluation with pre and post contrast-enhanced MRI  or CT in the non acute setting. 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 Given the patient's age, this is likely of doubtful clinical significance however vascular consultation can be performed as clinically indicated. Indeterminate calcified and cystic mass within the right adnexa. Grossly this appears similar in size when compared to prior MRI exams. This is nonspecific in etiology however may be benign given stability over time. Pancolonic  diverticulosis. Electronically Signed   By: Lovey Newcomer M.D.   On: 03/17/2016 11:34   Ct Cervical Spine Wo Contrast  Result Date: 03/17/2016 CLINICAL DATA:  Pain following fall.  Underlying dementia EXAM: CT HEAD WITHOUT CONTRAST CT CERVICAL SPINE WITHOUT CONTRAST TECHNIQUE: Multidetector CT imaging of the head and cervical spine was performed following the standard protocol without intravenous contrast. Multiplanar CT image reconstructions of the cervical spine were also generated. COMPARISON:  CT head January 21, 2016; CT cervical spine November 10, 2015 FINDINGS: CT HEAD FINDINGS Brain: Moderate diffuse atrophy is stable. There is no evident intracranial mass, hemorrhage, extra-axial fluid collection, or midline shift. There is small vessel disease throughout the centra semiovale bilaterally, stable. No new gray-white compartment lesion is evident. No acute infarct appreciable. Calcification along the tentorium bilaterally is stable. Vascular: No hyper dense vessels. There is extensive carotid siphon calcification bilaterally as well as calcification in both distal vertebral arteries. Skull: The bony calvarium is intact. Sinuses/Orbits: There is opacification in a posterior right ethmoid air cell. There is slight mucosal thickening in several ethmoid air cells elsewhere. Other visualized paranasal sinuses are clear. Visualized orbits appear symmetric bilaterally. Note that the patient has had cataract removal on the right, stable. Other: Mastoid air cells are clear CT CERVICAL SPINE FINDINGS Alignment: There is cervical dextroscoliosis. There is just over 3 mm of anterolisthesis of C3 on C4, slightly increased from prior study. There is just under 2 mm of anterolisthesis of C4 on C5, stable. No spondylolisthesis is evident elsewhere. Skull base and vertebrae: Skull base and craniocervical junction regions appear normal. There is moderate pannus posterior to the odontoid which is not causing appreciable  impression on the craniocervical junction. There is no evident fracture. There are no blastic or lytic bone lesions. Soft tissues and spinal canal: The prevertebral soft tissues and predental space regions are normal. No paraspinous lesions are evident. There is no cord or canal hematoma evident. No spinal stenosis appreciable. Disc levels: There is marked disc space narrowing at C4-5. There is moderately severe disc space narrowing at C5-6 and C6-7. There is moderate disc space narrowing at C3-4 and C7-T1. There is facet hypertrophy at most levels bilaterally. There is no frank disc extrusion. Upper chest: There is aortic atherosclerosis. There is mild scarring in the lung apices superiorly. Other: There is calcification in both carotid arteries. There are multiple small nodular lesions in the thyroid without dominant thyroid mass evident. IMPRESSION: CT head: Stable atrophy with extensive supratentorial small vessel disease. No acute infarct evident. No mass, hemorrhage, or extra-axial fluid collection. Multiple foci of arteriovascular calcification present. There is ethmoid sinus disease, most notably in a posterior right ethmoid air cell, where there is opacification of much of this air cell. CT cervical spine: No fracture. Spondylolisthesis at C3-4 and C4-5. Note that there is slightly more anterolisthesis at C3-4 than on prior study. Spondylolisthesis at C4-5 stable. The spondylolisthesis at these levels is felt to be due to spondylosis underlying. There is multifocal arthropathy. No frank disc extrusion or high-grade stenosis.  Aortic atherosclerosis. Bilateral carotid artery calcification. Multinodular goiter without dominant thyroid mass. Electronically Signed   By: Lowella Grip III M.D.   On: 03/17/2016 09:44   Ct Thoracic Spine Wo Contrast  Result Date: 03/17/2016 CLINICAL DATA:  Back pain, fell last night, dementia EXAM: CT THORACIC AND LUMBAR SPINE WITHOUT CONTRAST TECHNIQUE: Multidetector CT  imaging of the thoracic and lumbar spine was performed without contrast. Multiplanar CT image reconstructions were also generated. COMPARISON:  Chest radiograph 01/21/2016, MR pelvis 01/04/2010 FINDINGS: CT THORACIC SPINE FINDINGS Alignment: 12 rib pairs. Mild dextroconvex thoracic scoliosis. Otherwise normal. Vertebrae: Marked osseous demineralization. Chronic marked compression fracture of T12 vertebral body with retropulsion of a posterior fragment, significant AP narrowing of the spinal canal, and evidence of spinal augmentation procedure. Remaining vertebral body heights maintained. Few scattered endplate spurs and mild disc space narrowing at mid thoracic levels. No acute thoracic spine fracture or subluxation Paraspinal and other soft tissues: Extensive atherosclerotic calcification aorta with aneurysmal dilatation of the descending thoracic aorta to 3.2 x 3.5 cm image 68. Coronary arterial calcifications. Heart appears enlarged. No definite thoracic adenopathy. Dependent atelectasis in both lungs. Calcified granuloma LEFT upper lobe. Disc levels: Grossly unremarkable. CT LUMBAR SPINE FINDINGS Segmentation: 5 non-rib-bearing lumbar type vertebra. Alignment: Normal Vertebrae: Marked osseous demineralization. Vertebral body heights maintained without acute fracture or subluxation. Multilevel facet degenerative changes. SI joints appear symmetric and preserved. No obvious sacral fracture. Paraspinal and other soft tissues: Extensive atherosclerotic calcifications abdominal aorta and iliac arteries. Partially calcified mass in the RIGHT pelvis, 6.9 x 5.3 x 8.2 cm, uncertain if arising from uterus or adnexa diet present since and grossly unchanged since an MRI of the pelvis from 2011. Disc levels: Diffusely narrowed.  No gross disc herniation. IMPRESSION: CT THORACIC SPINE IMPRESSION Marked osseous demineralization. Old T12 compression fracture and vertebroplasty with stable retropulsion of a dominant fragment and  AP narrowing of the spinal canal. No acute thoracic spine abnormalities. CT LUMBAR SPINE IMPRESSION Osseous demineralization with degenerative disc and facet disease changes lumbar spine. No acute lumbar spine abnormalities. Partially calcified 6.9 x 5.3 x 8.2 cm diameter mass within the RIGHT pelvis, present since 2011, either of RIGHT ovarian or less likely uterine origin, favor benign in light of time this has been present and similar in appearance. Extensive aortic atherosclerosis with mild aneurysmal dilatation of the descending thoracic aorta up to 3.2 x 3.5 cm. Coronary arterial calcifications. Electronically Signed   By: Lavonia Dana M.D.   On: 03/17/2016 11:51   Ct Lumbar Spine Wo Contrast  Result Date: 03/17/2016 CLINICAL DATA:  Back pain, fell last night, dementia EXAM: CT THORACIC AND LUMBAR SPINE WITHOUT CONTRAST TECHNIQUE: Multidetector CT imaging of the thoracic and lumbar spine was performed without contrast. Multiplanar CT image reconstructions were also generated. COMPARISON:  Chest radiograph 01/21/2016, MR pelvis 01/04/2010 FINDINGS: CT THORACIC SPINE FINDINGS Alignment: 12 rib pairs. Mild dextroconvex thoracic scoliosis. Otherwise normal. Vertebrae: Marked osseous demineralization. Chronic marked compression fracture of T12 vertebral body with retropulsion of a posterior fragment, significant AP narrowing of the spinal canal, and evidence of spinal augmentation procedure. Remaining vertebral body heights maintained. Few scattered endplate spurs and mild disc space narrowing at mid thoracic levels. No acute thoracic spine fracture or subluxation Paraspinal and other soft tissues: Extensive atherosclerotic calcification aorta with aneurysmal dilatation of the descending thoracic aorta to 3.2 x 3.5 cm image 68. Coronary arterial calcifications. Heart appears enlarged. No definite thoracic adenopathy. Dependent atelectasis in both lungs. Calcified granuloma LEFT upper lobe.  Disc levels: Grossly  unremarkable. CT LUMBAR SPINE FINDINGS Segmentation: 5 non-rib-bearing lumbar type vertebra. Alignment: Normal Vertebrae: Marked osseous demineralization. Vertebral body heights maintained without acute fracture or subluxation. Multilevel facet degenerative changes. SI joints appear symmetric and preserved. No obvious sacral fracture. Paraspinal and other soft tissues: Extensive atherosclerotic calcifications abdominal aorta and iliac arteries. Partially calcified mass in the RIGHT pelvis, 6.9 x 5.3 x 8.2 cm, uncertain if arising from uterus or adnexa diet present since and grossly unchanged since an MRI of the pelvis from 2011. Disc levels: Diffusely narrowed.  No gross disc herniation. IMPRESSION: CT THORACIC SPINE IMPRESSION Marked osseous demineralization. Old T12 compression fracture and vertebroplasty with stable retropulsion of a dominant fragment and AP narrowing of the spinal canal. No acute thoracic spine abnormalities. CT LUMBAR SPINE IMPRESSION Osseous demineralization with degenerative disc and facet disease changes lumbar spine. No acute lumbar spine abnormalities. Partially calcified 6.9 x 5.3 x 8.2 cm diameter mass within the RIGHT pelvis, present since 2011, either of RIGHT ovarian or less likely uterine origin, favor benign in light of time this has been present and similar in appearance. Extensive aortic atherosclerosis with mild aneurysmal dilatation of the descending thoracic aorta up to 3.2 x 3.5 cm. Coronary arterial calcifications. Electronically Signed   By: Lavonia Dana M.D.   On: 03/17/2016 11:51   Ct Abdomen Pelvis W Contrast  Result Date: 03/17/2016 CLINICAL DATA:  Patient status post fall. Evaluate for traumatic injury. EXAM: CT CHEST, ABDOMEN, AND PELVIS WITH CONTRAST TECHNIQUE: Multidetector CT imaging of the chest, abdomen and pelvis was performed following the standard protocol during bolus administration of intravenous contrast. CONTRAST:  22mL ISOVUE-300 IOPAMIDOL (ISOVUE-300)  INJECTION 61% COMPARISON:  MR pelvis 01/04/2010 FINDINGS: CT CHEST FINDINGS Cardiovascular: Heart is enlarged. No pericardial effusion. Peripheral atherosclerotic plaque involving the thoracic aorta. 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 The aorta measures up to 3.4 cm at this location (image 27; series 2). Mediastinum/Nodes: No enlarged axillary, mediastinal or hilar lymphadenopathy. Lungs/Pleura: Central airways are patent. Dependent atelectasis within the bilateral lower lobes. Calcified granuloma within the left upper lobe. 4 mm right middle lobe nodule (image 74; series 4). No pleural effusion or pneumothorax. Musculoskeletal: Mildly displaced fracture through the anterior left second and third ribs (image 47; series 4) (image 70; series 4). Nondisplaced posterior left ninth, twelfth and eleventh rib fractures. Chronic appearing T12 compression deformity with kyphoplasty material. Narrowing of the spinal canal at this level. Nondisplaced right tenth rib fracture. CT ABDOMEN PELVIS FINDINGS Hepatobiliary: Liver is normal in size and contour. Gallbladder is unremarkable. No intrahepatic or extrahepatic biliary ductal dilatation. Pancreas: Unremarkable Spleen: Unremarkable Adrenals/Urinary Tract: The adrenal glands are normal. Kidneys enhance symmetrically with contrast. There is a 1.7 x 1.8 cm exophytic lesion off the interpolar region of the left kidney within internal density of 49 Hounsfield units (image 58; series 2). Urinary bladder is dilated. Stomach/Bowel: Pancolonic diverticulosis. No CT evidence for acute diverticulitis. No free fluid or free intraperitoneal air. No evidence for bowel obstruction. Vascular/Lymphatic: Peripheral calcified atherosclerotic plaque involving the abdominal aorta. No retroperitoneal lymphadenopathy. Reproductive: There is a large partially calcified right adnexal mass and associated cyst. Cyst measures up to 4 cm stone in dimension. Sizes this  mass appears similar to prior MRI 01/04/2010. Other: None. Musculoskeletal: Old left inferior pubic ramus fracture. Osseous demineralization. Lumbar spine degenerative changes. No aggressive or acute appearing osseous lesions. IMPRESSION: Left anterior second and third mildly displaced rib fractures. Nondisplaced posterior left ninth,  eleventh and twelfth rib fractures. Nondisplaced right tenth rib fracture. Chronic appearing T12 compression deformity with kyphoplasty material. There is narrowing of the spinal canal at this level. Otherwise no acute process within the chest, abdomen or pelvis. Indeterminate dense exophytic mass off the interpolar region of the left kidney. This may represent a complicated cyst or solid renal mass. Recommend dedicated evaluation with pre and post contrast-enhanced MRI or CT in the non acute setting. 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 Given the patient's age, this is likely of doubtful clinical significance however vascular consultation can be performed as clinically indicated. Indeterminate calcified and cystic mass within the right adnexa. Grossly this appears similar in size when compared to prior MRI exams. This is nonspecific in etiology however may be benign given stability over time. Pancolonic diverticulosis. Electronically Signed   By: Lovey Newcomer M.D.   On: 03/17/2016 11:34   Dg Knee Complete 4 Views Right  Result Date: 03/17/2016 CLINICAL DATA:  Witnessed fall last night.  Pain. EXAM: RIGHT KNEE - COMPLETE 4+ VIEW COMPARISON:  None. FINDINGS: Complete transverse fracture of the patella, displaced a few mm with slight offset. No fracture of the femur, tibia or fibula. Small lipoma hemarthrosis. IMPRESSION: Complete transverse fracture of the mid patella. Electronically Signed   By: Nelson Chimes M.D.   On: 03/17/2016 09:37   Dg Hip Unilat With Pelvis 2-3 Views Right  Result Date:  03/17/2016 CLINICAL DATA:  Witnessed fall last night. EXAM: DG HIP (WITH OR WITHOUT PELVIS) 2-3V RIGHT COMPARISON:  None. FINDINGS: No evidence of pelvic or hip fracture. Extensive pelvic calcifications presumed represent chronic benign uterine disease. Old healed rami fractures on the left. IMPRESSION: No acute finding. Electronically Signed   By: Nelson Chimes M.D.   On: 03/17/2016 09:35   Dg Femur, Min 2 Views Right  Result Date: 03/17/2016 CLINICAL DATA:  Witnessed fall last night.  Pain. EXAM: RIGHT FEMUR 2 VIEWS COMPARISON:  None. FINDINGS: There is no evidence of fracture or other focal bone lesions. Soft tissues are unremarkable. IMPRESSION: Negative. Electronically Signed   By: Nelson Chimes M.D.   On: 03/17/2016 09:36    EKG: Independently reviewed. Normal sinus rhythm   Assessment/Plan Present on Admission: . Closed patellar sleeve fracture of left knee: Currently in immobilizer as per orthopedic recommendations. They will formally follow-up and consult.  If surgery is indicated, patient will require a longer hospital stay. . Essential hypertension: Watch for elevated blood pressures given pain. Continued on home medications. . Constipation: When necessary MiraLAX . Senile dementia without behavioral disturbance: Patient at her baseline. She is a high fall risk. Monitor closely. When necessary home medications for agitation . Hypothyroidism: Continue Synthroid . Fall . Fracture of multiple ribs: To be seen by trauma service. Incentive spirometry. Left renal mass: Incidentally noted. Could better evaluate with a dedicated MRI, however this would not necessarily change anything as aggressive intervention such as a nephrectomy she did indeed have a renal cell carcinoma would not be done on this patient given her advanced age, dementia and DO NOT RESUSCITATE status. 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 Awaiting follow-up from trauma surgery and orthopedics. If they opt to do nothing, she can go back to her nursing home tomorrow. If surgery is to be done for her knee, she will require a longer hospital stay   Consults called:  Trauma surgery Orthopedic surgery -[place]  Admission status: For now, patient placed in observation as she has a potential for discharge home tomorrow.     however, if orthopedics opts to surgically repair her knee, she will require change to inpatient status   Annita Brod MD [facility] Pager [phone]  If 7PM-7AM, please contact night-coverage [email or web address] Password [email or web address]  03/17/2016, 3:06 PM

## 2016-03-17 NOTE — ED Provider Notes (Signed)
[facility] DEPT Provider Note   CSN: [id number] Arrival date & time: 03/17/16  [id number]     History   Chief Complaint Chief Complaint  Patient presents with  . Fall  . Fractured R Patellar    HPI [name] is a 83 y.o. female hx of dementia, HTN, Here presenting with fall. Patient is from a nursing home and apparently had a mechanical fall while walking yesterday. Patient did not remember how she fell. No witnessed syncope or loss of consciousness. No reported head injury. Patient had x-rays done at the facility yesterday that showed a patella fracture and was sent to see ortho but was sent to the ED for evaluation. Patient unable to give history. Patient is on baby ASA, no blood thinners.      The history is provided by the patient.   Level V caveat- dementia    Past Medical History:  Diagnosis Date  . Abnormality of gait 03/15/2010  . Anxiety state, unspecified 03/15/2010  . Depression, major 06/15/2015  . Depressive disorder, not elsewhere classified 03/15/2010  . Dysphagia, unspecified(787.20) 03/15/2010  . Generalized anxiety disorder 06/23/2014  . Herpes zoster without mention of complication [id number]  . Other alteration of consciousness 03/15/2010  . Pain in hand 08/13/12  . Personal history of fall 03/15/2010  . Senile osteoporosis 03/15/2010  . Unspecified constipation 03/15/2010  . Unspecified essential hypertension 03/15/2010  . Unspecified hearing loss 08/22/2011  . Unspecified urinary incontinence 03/15/2010  . Urinary incontinence 03/15/2010  . Vaginal prolapse 06/15/2015    Patient Active Problem List   Diagnosis Date Noted  . Patella fracture 03/17/2016  . Vitamin B 12 deficiency 02/15/2016  . Syncope 01/21/2016  . Cellulitis 01/21/2016  . Senile dementia 12/31/2015  . Hypothyroidism 12/14/2015  . Injury of left shin 12/10/2015  . Rash 09/21/2015  . Urinary tract infection 09/14/2015  . Constipation 06/15/2015  . Urinary frequency 06/15/2015  .  Depression, major 06/15/2015  . Vaginal prolapse 06/15/2015  . Edema 04/21/2015  . Hallucinations 12/22/2014  . Speech abnormality   . TIA (transient ischemic attack) 12/10/2014  . Hypokalemia 12/10/2014  . Generalized anxiety disorder 06/23/2014  . Hearing loss 06/03/2014  . Insomnia 12/16/2013  . Trigger finger, acquired 03/04/2013  . Pain in hand   . Essential hypertension 03/15/2010  . Urinary incontinence 03/15/2010  . Abnormality of gait 03/15/2010    Past Surgical History:  Procedure Laterality Date  . INCISION / DRAINAGE HAND / FINGER Right 06/2008   hand  . SPINE SURGERY  12/2006    OB History    No data available       Home Medications    Prior to Admission medications   Medication Sig Start Date End Date Taking? Authorizing Provider  aspirin EC 81 MG tablet Take 1 tablet (81 mg total) by mouth daily. 12/11/14  Yes Theodis Blaze, MD  Calcium Carbonate-Vitamin D (CALCIUM 600+D) 600-200 MG-UNIT TABS Take 1 tablet by mouth daily.    Yes Historical Provider, MD  citalopram (CELEXA) 10 MG tablet One daily to help depression and anxiety Patient taking differently: Take 10 mg by mouth daily. to help depression and anxiety 06/15/15  Yes Estill Dooms, MD  Cobalamine Combinations (VITAMIN B12-FOLIC ACID) XX123456 MCG TABS Take 1 tablet by mouth daily with breakfast.   Yes Historical Provider, MD  hydrocortisone (PROCTO-MED HC) 2.5 % rectal cream Place 1 application rectally every 12 (twelve) hours as needed for hemorrhoids or itching.   Yes Historical Provider,  MD  ibuprofen (ADVIL,MOTRIN) 200 MG tablet Take 400 mg by mouth every 6 (six) hours as needed (for pain).   Yes Historical Provider, MD  levothyroxine (SYNTHROID, LEVOTHROID) 25 MCG tablet Take 25 mcg by mouth daily before breakfast.   Yes Historical Provider, MD  LORazepam (ATIVAN) 0.5 MG tablet Take 0.5 mg by mouth every 8 (eight) hours as needed for anxiety.   Yes Historical Provider, MD  metoprolol tartrate  (LOPRESSOR) 25 MG tablet Take 12.5 mg by mouth 2 (two) times daily.   Yes Historical Provider, MD  mineral oil-hydrophilic petrolatum (AQUAPHOR) ointment Apply 1 application topically daily as needed for dry skin.   Yes Historical Provider, MD  polyethylene glycol (MIRALAX / GLYCOLAX) packet Take 17 g by mouth daily. 01/24/16  Yes Eber Jones, MD  risperiDONE (RISPERDAL) 0.5 MG tablet One at bedtime to prevent hallucinations and anxiety Patient taking differently: Take 0.5 mg by mouth at bedtime. to prevent hallucinations and anxiety 06/22/15  Yes Estill Dooms, MD  vitamin B-12 (CYANOCOBALAMIN) 1000 MCG tablet Take 1,000 mcg by mouth daily.   Yes Historical Provider, MD    Family History Family History  Problem Relation Age of Onset  . Heart disease Father     MI    Social History Social History  Substance Use Topics  . Smoking status: Never Smoker  . Smokeless tobacco: Never Used  . Alcohol use Yes     Comment: 2-4 oz of wine once daily at request     Allergies   Ace inhibitors; Hydrochlorothiazide; and Morphine and related   Review of Systems Review of Systems  Musculoskeletal:       R knee and hip pain   All other systems reviewed and are negative.    Physical Exam Updated Vital Signs BP 139/75 (BP Location: Left Arm)   Pulse (!) 54   Temp 98.5 F (36.9 C) (Oral)   Resp 11   Ht 5\' 2"  (1.575 m)   Wt 120 lb 12.8 oz (54.8 kg)   SpO2 95%   BMI 22.09 kg/m   Physical Exam  Constitutional:  Chronically ill   HENT:  Head: Normocephalic and atraumatic.  Mouth/Throat: Oropharynx is clear and moist.  Eyes: EOM are normal. Pupils are equal, round, and reactive to light.  Neck: Normal range of motion. Neck supple.  Cardiovascular: Normal rate, regular rhythm and normal heart sounds.   Pulmonary/Chest: Effort normal and breath sounds normal.  Bruising R chest, + bilateral breath sounds   Abdominal: Soft. Bowel sounds are normal. She exhibits no distension.  There is no tenderness. There is no guarding.  Musculoskeletal:  Bruising R hip and proximal femur. Mild R knee swelling and tenderness. No obvious tib/fib tenderness   Neurological:  Demented, moving all extremities except R hip   Skin: Skin is warm.  Psychiatric: She has a normal mood and affect.  Nursing note and vitals reviewed.    ED Treatments / Results  Labs (all labs ordered are listed, but only abnormal results are displayed) Labs Reviewed  CBC WITH DIFFERENTIAL/PLATELET - Abnormal; Notable for the following:       Result Value   Monocytes Absolute 1.2 (*)    All other components within normal limits  COMPREHENSIVE METABOLIC PANEL - Abnormal; Notable for the following:    Potassium 3.3 (*)    ALT 13 (*)    All other components within normal limits  PROTIME-INR  TYPE AND SCREEN  ABO/RH    EKG  EKG  Interpretation  Date/Time:  Friday March 17 2016 08:48:12 EST Ventricular Rate:  56 PR Interval:    QRS Duration: 92 QT Interval:  486 QTC Calculation: 470 R Axis:   -33 Text Interpretation:  Sinus rhythm Short PR interval Left axis deviation Consider anterior infarct Borderline T abnormalities, inferior leads No significant change since last tracing Confirmed by YAO  MD, DAVID ([id number]) on 03/17/2016 9:06:05 AM       Radiology Dg Chest 2 View  Result Date: 03/17/2016 CLINICAL DATA:  Witnessed fall last night. EXAM: CHEST  2 VIEW COMPARISON:  01/21/2016 FINDINGS: Frontal view degraded by motion. There is left ventricular prominence. There is chronic aortic atherosclerosis. Right lung is clear. There is abnormal density in the left lower lobe that could be atelectasis and/or pneumonia. Compared to the previous study, there is slight further loss of height of a lower thoracic vertebral body. Old augmented compression fracture down lower appears unchanged. IMPRESSION: Left lower lobe atelectasis and/or pneumonia. Slight compression fracture in the mid to lower thoracic  spine. Electronically Signed   By: Nelson Chimes M.D.   On: 03/17/2016 09:34   Ct Head Wo Contrast  Result Date: 03/17/2016 CLINICAL DATA:  Pain following fall.  Underlying dementia EXAM: CT HEAD WITHOUT CONTRAST CT CERVICAL SPINE WITHOUT CONTRAST TECHNIQUE: Multidetector CT imaging of the head and cervical spine was performed following the standard protocol without intravenous contrast. Multiplanar CT image reconstructions of the cervical spine were also generated. COMPARISON:  CT head January 21, 2016; CT cervical spine November 10, 2015 FINDINGS: CT HEAD FINDINGS Brain: Moderate diffuse atrophy is stable. There is no evident intracranial mass, hemorrhage, extra-axial fluid collection, or midline shift. There is small vessel disease throughout the centra semiovale bilaterally, stable. No new gray-white compartment lesion is evident. No acute infarct appreciable. Calcification along the tentorium bilaterally is stable. Vascular: No hyper dense vessels. There is extensive carotid siphon calcification bilaterally as well as calcification in both distal vertebral arteries. Skull: The bony calvarium is intact. Sinuses/Orbits: There is opacification in a posterior right ethmoid air cell. There is slight mucosal thickening in several ethmoid air cells elsewhere. Other visualized paranasal sinuses are clear. Visualized orbits appear symmetric bilaterally. Note that the patient has had cataract removal on the right, stable. Other: Mastoid air cells are clear CT CERVICAL SPINE FINDINGS Alignment: There is cervical dextroscoliosis. There is just over 3 mm of anterolisthesis of C3 on C4, slightly increased from prior study. There is just under 2 mm of anterolisthesis of C4 on C5, stable. No spondylolisthesis is evident elsewhere. Skull base and vertebrae: Skull base and craniocervical junction regions appear normal. There is moderate pannus posterior to the odontoid which is not causing appreciable impression on the  craniocervical junction. There is no evident fracture. There are no blastic or lytic bone lesions. Soft tissues and spinal canal: The prevertebral soft tissues and predental space regions are normal. No paraspinous lesions are evident. There is no cord or canal hematoma evident. No spinal stenosis appreciable. Disc levels: There is marked disc space narrowing at C4-5. There is moderately severe disc space narrowing at C5-6 and C6-7. There is moderate disc space narrowing at C3-4 and C7-T1. There is facet hypertrophy at most levels bilaterally. There is no frank disc extrusion. Upper chest: There is aortic atherosclerosis. There is mild scarring in the lung apices superiorly. Other: There is calcification in both carotid arteries. There are multiple small nodular lesions in the thyroid without dominant thyroid mass evident. IMPRESSION: CT head:  Stable atrophy with extensive supratentorial small vessel disease. No acute infarct evident. No mass, hemorrhage, or extra-axial fluid collection. Multiple foci of arteriovascular calcification present. There is ethmoid sinus disease, most notably in a posterior right ethmoid air cell, where there is opacification of much of this air cell. CT cervical spine: No fracture. Spondylolisthesis at C3-4 and C4-5. Note that there is slightly more anterolisthesis at C3-4 than on prior study. Spondylolisthesis at C4-5 stable. The spondylolisthesis at these levels is felt to be due to spondylosis underlying. There is multifocal arthropathy. No frank disc extrusion or high-grade stenosis. Aortic atherosclerosis. Bilateral carotid artery calcification. Multinodular goiter without dominant thyroid mass. Electronically Signed   By: Lowella Grip III M.D.   On: 03/17/2016 09:44   Ct Chest W Contrast  Result Date: 03/17/2016 CLINICAL DATA:  Patient status post fall. Evaluate for traumatic injury. EXAM: CT CHEST, ABDOMEN, AND PELVIS WITH CONTRAST TECHNIQUE: Multidetector CT imaging of  the chest, abdomen and pelvis was performed following the standard protocol during bolus administration of intravenous contrast. CONTRAST:  108mL ISOVUE-300 IOPAMIDOL (ISOVUE-300) INJECTION 61% COMPARISON:  MR pelvis 01/04/2010 FINDINGS: CT CHEST FINDINGS Cardiovascular: Heart is enlarged. No pericardial effusion. Peripheral atherosclerotic plaque involving the thoracic aorta. 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 The aorta measures up to 3.4 cm at this location (image 27; series 2). Mediastinum/Nodes: No enlarged axillary, mediastinal or hilar lymphadenopathy. Lungs/Pleura: Central airways are patent. Dependent atelectasis within the bilateral lower lobes. Calcified granuloma within the left upper lobe. 4 mm right middle lobe nodule (image 74; series 4). No pleural effusion or pneumothorax. Musculoskeletal: Mildly displaced fracture through the anterior left second and third ribs (image 47; series 4) (image 70; series 4). Nondisplaced posterior left ninth, twelfth and eleventh rib fractures. Chronic appearing T12 compression deformity with kyphoplasty material. Narrowing of the spinal canal at this level. Nondisplaced right tenth rib fracture. CT ABDOMEN PELVIS FINDINGS Hepatobiliary: Liver is normal in size and contour. Gallbladder is unremarkable. No intrahepatic or extrahepatic biliary ductal dilatation. Pancreas: Unremarkable Spleen: Unremarkable Adrenals/Urinary Tract: The adrenal glands are normal. Kidneys enhance symmetrically with contrast. There is a 1.7 x 1.8 cm exophytic lesion off the interpolar region of the left kidney within internal density of 49 Hounsfield units (image 58; series 2). Urinary bladder is dilated. Stomach/Bowel: Pancolonic diverticulosis. No CT evidence for acute diverticulitis. No free fluid or free intraperitoneal air. No evidence for bowel obstruction. Vascular/Lymphatic: Peripheral calcified atherosclerotic plaque involving the abdominal aorta. No  retroperitoneal lymphadenopathy. Reproductive: There is a large partially calcified right adnexal mass and associated cyst. Cyst measures up to 4 cm stone in dimension. Sizes this mass appears similar to prior MRI 01/04/2010. Other: None. Musculoskeletal: Old left inferior pubic ramus fracture. Osseous demineralization. Lumbar spine degenerative changes. No aggressive or acute appearing osseous lesions. IMPRESSION: Left anterior second and third mildly displaced rib fractures. Nondisplaced posterior left ninth, eleventh and twelfth rib fractures. Nondisplaced right tenth rib fracture. Chronic appearing T12 compression deformity with kyphoplasty material. There is narrowing of the spinal canal at this level. Otherwise no acute process within the chest, abdomen or pelvis. Indeterminate dense exophytic mass off the interpolar region of the left kidney. This may represent a complicated cyst or solid renal mass. Recommend dedicated evaluation with pre and post contrast-enhanced MRI or CT in the non acute setting. 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 Given the patient's age, this is likely of doubtful clinical significance however  vascular consultation can be performed as clinically indicated. Indeterminate calcified and cystic mass within the right adnexa. Grossly this appears similar in size when compared to prior MRI exams. This is nonspecific in etiology however may be benign given stability over time. Pancolonic diverticulosis. Electronically Signed   By: Lovey Newcomer M.D.   On: 03/17/2016 11:34   Ct Cervical Spine Wo Contrast  Result Date: 03/17/2016 CLINICAL DATA:  Pain following fall.  Underlying dementia EXAM: CT HEAD WITHOUT CONTRAST CT CERVICAL SPINE WITHOUT CONTRAST TECHNIQUE: Multidetector CT imaging of the head and cervical spine was performed following the standard protocol without intravenous contrast. Multiplanar CT image  reconstructions of the cervical spine were also generated. COMPARISON:  CT head January 21, 2016; CT cervical spine November 10, 2015 FINDINGS: CT HEAD FINDINGS Brain: Moderate diffuse atrophy is stable. There is no evident intracranial mass, hemorrhage, extra-axial fluid collection, or midline shift. There is small vessel disease throughout the centra semiovale bilaterally, stable. No new gray-white compartment lesion is evident. No acute infarct appreciable. Calcification along the tentorium bilaterally is stable. Vascular: No hyper dense vessels. There is extensive carotid siphon calcification bilaterally as well as calcification in both distal vertebral arteries. Skull: The bony calvarium is intact. Sinuses/Orbits: There is opacification in a posterior right ethmoid air cell. There is slight mucosal thickening in several ethmoid air cells elsewhere. Other visualized paranasal sinuses are clear. Visualized orbits appear symmetric bilaterally. Note that the patient has had cataract removal on the right, stable. Other: Mastoid air cells are clear CT CERVICAL SPINE FINDINGS Alignment: There is cervical dextroscoliosis. There is just over 3 mm of anterolisthesis of C3 on C4, slightly increased from prior study. There is just under 2 mm of anterolisthesis of C4 on C5, stable. No spondylolisthesis is evident elsewhere. Skull base and vertebrae: Skull base and craniocervical junction regions appear normal. There is moderate pannus posterior to the odontoid which is not causing appreciable impression on the craniocervical junction. There is no evident fracture. There are no blastic or lytic bone lesions. Soft tissues and spinal canal: The prevertebral soft tissues and predental space regions are normal. No paraspinous lesions are evident. There is no cord or canal hematoma evident. No spinal stenosis appreciable. Disc levels: There is marked disc space narrowing at C4-5. There is moderately severe disc space narrowing at  C5-6 and C6-7. There is moderate disc space narrowing at C3-4 and C7-T1. There is facet hypertrophy at most levels bilaterally. There is no frank disc extrusion. Upper chest: There is aortic atherosclerosis. There is mild scarring in the lung apices superiorly. Other: There is calcification in both carotid arteries. There are multiple small nodular lesions in the thyroid without dominant thyroid mass evident. IMPRESSION: CT head: Stable atrophy with extensive supratentorial small vessel disease. No acute infarct evident. No mass, hemorrhage, or extra-axial fluid collection. Multiple foci of arteriovascular calcification present. There is ethmoid sinus disease, most notably in a posterior right ethmoid air cell, where there is opacification of much of this air cell. CT cervical spine: No fracture. Spondylolisthesis at C3-4 and C4-5. Note that there is slightly more anterolisthesis at C3-4 than on prior study. Spondylolisthesis at C4-5 stable. The spondylolisthesis at these levels is felt to be due to spondylosis underlying. There is multifocal arthropathy. No frank disc extrusion or high-grade stenosis. Aortic atherosclerosis. Bilateral carotid artery calcification. Multinodular goiter without dominant thyroid mass. Electronically Signed   By: Lowella Grip III M.D.   On: 03/17/2016 09:44   Ct Thoracic Spine Wo  Contrast  Result Date: 03/17/2016 CLINICAL DATA:  Back pain, fell last night, dementia EXAM: CT THORACIC AND LUMBAR SPINE WITHOUT CONTRAST TECHNIQUE: Multidetector CT imaging of the thoracic and lumbar spine was performed without contrast. Multiplanar CT image reconstructions were also generated. COMPARISON:  Chest radiograph 01/21/2016, MR pelvis 01/04/2010 FINDINGS: CT THORACIC SPINE FINDINGS Alignment: 12 rib pairs. Mild dextroconvex thoracic scoliosis. Otherwise normal. Vertebrae: Marked osseous demineralization. Chronic marked compression fracture of T12 vertebral body with retropulsion of a  posterior fragment, significant AP narrowing of the spinal canal, and evidence of spinal augmentation procedure. Remaining vertebral body heights maintained. Few scattered endplate spurs and mild disc space narrowing at mid thoracic levels. No acute thoracic spine fracture or subluxation Paraspinal and other soft tissues: Extensive atherosclerotic calcification aorta with aneurysmal dilatation of the descending thoracic aorta to 3.2 x 3.5 cm image 68. Coronary arterial calcifications. Heart appears enlarged. No definite thoracic adenopathy. Dependent atelectasis in both lungs. Calcified granuloma LEFT upper lobe. Disc levels: Grossly unremarkable. CT LUMBAR SPINE FINDINGS Segmentation: 5 non-rib-bearing lumbar type vertebra. Alignment: Normal Vertebrae: Marked osseous demineralization. Vertebral body heights maintained without acute fracture or subluxation. Multilevel facet degenerative changes. SI joints appear symmetric and preserved. No obvious sacral fracture. Paraspinal and other soft tissues: Extensive atherosclerotic calcifications abdominal aorta and iliac arteries. Partially calcified mass in the RIGHT pelvis, 6.9 x 5.3 x 8.2 cm, uncertain if arising from uterus or adnexa diet present since and grossly unchanged since an MRI of the pelvis from 2011. Disc levels: Diffusely narrowed.  No gross disc herniation. IMPRESSION: CT THORACIC SPINE IMPRESSION Marked osseous demineralization. Old T12 compression fracture and vertebroplasty with stable retropulsion of a dominant fragment and AP narrowing of the spinal canal. No acute thoracic spine abnormalities. CT LUMBAR SPINE IMPRESSION Osseous demineralization with degenerative disc and facet disease changes lumbar spine. No acute lumbar spine abnormalities. Partially calcified 6.9 x 5.3 x 8.2 cm diameter mass within the RIGHT pelvis, present since 2011, either of RIGHT ovarian or less likely uterine origin, favor benign in light of time this has been present and  similar in appearance. Extensive aortic atherosclerosis with mild aneurysmal dilatation of the descending thoracic aorta up to 3.2 x 3.5 cm. Coronary arterial calcifications. Electronically Signed   By: Lavonia Dana M.D.   On: 03/17/2016 11:51   Ct Lumbar Spine Wo Contrast  Result Date: 03/17/2016 CLINICAL DATA:  Back pain, fell last night, dementia EXAM: CT THORACIC AND LUMBAR SPINE WITHOUT CONTRAST TECHNIQUE: Multidetector CT imaging of the thoracic and lumbar spine was performed without contrast. Multiplanar CT image reconstructions were also generated. COMPARISON:  Chest radiograph 01/21/2016, MR pelvis 01/04/2010 FINDINGS: CT THORACIC SPINE FINDINGS Alignment: 12 rib pairs. Mild dextroconvex thoracic scoliosis. Otherwise normal. Vertebrae: Marked osseous demineralization. Chronic marked compression fracture of T12 vertebral body with retropulsion of a posterior fragment, significant AP narrowing of the spinal canal, and evidence of spinal augmentation procedure. Remaining vertebral body heights maintained. Few scattered endplate spurs and mild disc space narrowing at mid thoracic levels. No acute thoracic spine fracture or subluxation Paraspinal and other soft tissues: Extensive atherosclerotic calcification aorta with aneurysmal dilatation of the descending thoracic aorta to 3.2 x 3.5 cm image 68. Coronary arterial calcifications. Heart appears enlarged. No definite thoracic adenopathy. Dependent atelectasis in both lungs. Calcified granuloma LEFT upper lobe. Disc levels: Grossly unremarkable. CT LUMBAR SPINE FINDINGS Segmentation: 5 non-rib-bearing lumbar type vertebra. Alignment: Normal Vertebrae: Marked osseous demineralization. Vertebral body heights maintained without acute fracture or subluxation. Multilevel facet degenerative changes.  SI joints appear symmetric and preserved. No obvious sacral fracture. Paraspinal and other soft tissues: Extensive atherosclerotic calcifications abdominal aorta and  iliac arteries. Partially calcified mass in the RIGHT pelvis, 6.9 x 5.3 x 8.2 cm, uncertain if arising from uterus or adnexa diet present since and grossly unchanged since an MRI of the pelvis from 2011. Disc levels: Diffusely narrowed.  No gross disc herniation. IMPRESSION: CT THORACIC SPINE IMPRESSION Marked osseous demineralization. Old T12 compression fracture and vertebroplasty with stable retropulsion of a dominant fragment and AP narrowing of the spinal canal. No acute thoracic spine abnormalities. CT LUMBAR SPINE IMPRESSION Osseous demineralization with degenerative disc and facet disease changes lumbar spine. No acute lumbar spine abnormalities. Partially calcified 6.9 x 5.3 x 8.2 cm diameter mass within the RIGHT pelvis, present since 2011, either of RIGHT ovarian or less likely uterine origin, favor benign in light of time this has been present and similar in appearance. Extensive aortic atherosclerosis with mild aneurysmal dilatation of the descending thoracic aorta up to 3.2 x 3.5 cm. Coronary arterial calcifications. Electronically Signed   By: Lavonia Dana M.D.   On: 03/17/2016 11:51   Ct Abdomen Pelvis W Contrast  Result Date: 03/17/2016 CLINICAL DATA:  Patient status post fall. Evaluate for traumatic injury. EXAM: CT CHEST, ABDOMEN, AND PELVIS WITH CONTRAST TECHNIQUE: Multidetector CT imaging of the chest, abdomen and pelvis was performed following the standard protocol during bolus administration of intravenous contrast. CONTRAST:  82mL ISOVUE-300 IOPAMIDOL (ISOVUE-300) INJECTION 61% COMPARISON:  MR pelvis 01/04/2010 FINDINGS: CT CHEST FINDINGS Cardiovascular: Heart is enlarged. No pericardial effusion. Peripheral atherosclerotic plaque involving the thoracic aorta. 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 The aorta measures up to 3.4 cm at this location (image 27; series 2). Mediastinum/Nodes: No enlarged axillary, mediastinal or hilar lymphadenopathy. Lungs/Pleura:  Central airways are patent. Dependent atelectasis within the bilateral lower lobes. Calcified granuloma within the left upper lobe. 4 mm right middle lobe nodule (image 74; series 4). No pleural effusion or pneumothorax. Musculoskeletal: Mildly displaced fracture through the anterior left second and third ribs (image 47; series 4) (image 70; series 4). Nondisplaced posterior left ninth, twelfth and eleventh rib fractures. Chronic appearing T12 compression deformity with kyphoplasty material. Narrowing of the spinal canal at this level. Nondisplaced right tenth rib fracture. CT ABDOMEN PELVIS FINDINGS Hepatobiliary: Liver is normal in size and contour. Gallbladder is unremarkable. No intrahepatic or extrahepatic biliary ductal dilatation. Pancreas: Unremarkable Spleen: Unremarkable Adrenals/Urinary Tract: The adrenal glands are normal. Kidneys enhance symmetrically with contrast. There is a 1.7 x 1.8 cm exophytic lesion off the interpolar region of the left kidney within internal density of 49 Hounsfield units (image 58; series 2). Urinary bladder is dilated. Stomach/Bowel: Pancolonic diverticulosis. No CT evidence for acute diverticulitis. No free fluid or free intraperitoneal air. No evidence for bowel obstruction. Vascular/Lymphatic: Peripheral calcified atherosclerotic plaque involving the abdominal aorta. No retroperitoneal lymphadenopathy. Reproductive: There is a large partially calcified right adnexal mass and associated cyst. Cyst measures up to 4 cm stone in dimension. Sizes this mass appears similar to prior MRI 01/04/2010. Other: None. Musculoskeletal: Old left inferior pubic ramus fracture. Osseous demineralization. Lumbar spine degenerative changes. No aggressive or acute appearing osseous lesions. IMPRESSION: Left anterior second and third mildly displaced rib fractures. Nondisplaced posterior left ninth, eleventh and twelfth rib fractures. Nondisplaced right tenth rib fracture. Chronic appearing T12  compression deformity with kyphoplasty material. There is narrowing of the spinal canal at this level. Otherwise no acute process within  the chest, abdomen or pelvis. Indeterminate dense exophytic mass off the interpolar region of the left kidney. This may represent a complicated cyst or solid renal mass. Recommend dedicated evaluation with pre and post contrast-enhanced MRI or CT in the non acute setting. 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 Given the patient's age, this is likely of doubtful clinical significance however vascular consultation can be performed as clinically indicated. Indeterminate calcified and cystic mass within the right adnexa. Grossly this appears similar in size when compared to prior MRI exams. This is nonspecific in etiology however may be benign given stability over time. Pancolonic diverticulosis. Electronically Signed   By: Lovey Newcomer M.D.   On: 03/17/2016 11:34   Dg Knee Complete 4 Views Right  Result Date: 03/17/2016 CLINICAL DATA:  Witnessed fall last night.  Pain. EXAM: RIGHT KNEE - COMPLETE 4+ VIEW COMPARISON:  None. FINDINGS: Complete transverse fracture of the patella, displaced a few mm with slight offset. No fracture of the femur, tibia or fibula. Small lipoma hemarthrosis. IMPRESSION: Complete transverse fracture of the mid patella. Electronically Signed   By: Nelson Chimes M.D.   On: 03/17/2016 09:37   Dg Hip Unilat With Pelvis 2-3 Views Right  Result Date: 03/17/2016 CLINICAL DATA:  Witnessed fall last night. EXAM: DG HIP (WITH OR WITHOUT PELVIS) 2-3V RIGHT COMPARISON:  None. FINDINGS: No evidence of pelvic or hip fracture. Extensive pelvic calcifications presumed represent chronic benign uterine disease. Old healed rami fractures on the left. IMPRESSION: No acute finding. Electronically Signed   By: Nelson Chimes M.D.   On: 03/17/2016 09:35   Dg Femur, Min 2 Views Right  Result Date:  03/17/2016 CLINICAL DATA:  Witnessed fall last night.  Pain. EXAM: RIGHT FEMUR 2 VIEWS COMPARISON:  None. FINDINGS: There is no evidence of fracture or other focal bone lesions. Soft tissues are unremarkable. IMPRESSION: Negative. Electronically Signed   By: Nelson Chimes M.D.   On: 03/17/2016 09:36    Procedures Procedures (including critical care time)  Medications Ordered in ED Medications  iopamidol (ISOVUE-300) 61 % injection (75 mLs  Contrast Given 03/17/16 1037)     Initial Impression / Assessment and Plan / ED Course  I have reviewed the triage vital signs and the nursing notes.  Pertinent labs & imaging results that were available during my care of the patient were reviewed by me and considered in my medical decision making (see chart for details).     [name] is a 83 y.o. female here with fall. Unclear how she fell. Will get labs, preop clearance, xrays, CT head/neck.   12:40 PM  Labs unremarkable. Initial CXR showed possible thoracic fracture with pleural effusion. Trauma scan performed. CT head/neck unremarkable. CT chest with multiple rib fractures with no pneumothorax. Has old thoracic and lumbar fractures and old pelvic fractures. She has R knee patellar fracture. I consulted Dr. Stann Mainland from ortho, who recommend knee immobilizer and he will see patient. I called surgery, who will see patient. Given that patient is DNR and has no shortness of breath, they request medicine admission and they will consult. Plan to keep at [facility] for now per surgery.    Final Clinical Impressions(s) / ED Diagnoses   Final diagnoses:  Fall  Fall    New Prescriptions New Prescriptions   No medications on file     Drenda Freeze, MD 03/17/16 1242

## 2016-03-17 NOTE — ED Notes (Signed)
Bed: WA03 Expected date:  Expected time:  Means of arrival:  Comments: EMS- 83yo F, knee injury/patella fracture

## 2016-03-18 DIAGNOSIS — K5900 Constipation, unspecified: Secondary | ICD-10-CM

## 2016-03-18 DIAGNOSIS — S2242XD Multiple fractures of ribs, left side, subsequent encounter for fracture with routine healing: Secondary | ICD-10-CM

## 2016-03-18 DIAGNOSIS — W19XXXA Unspecified fall, initial encounter: Secondary | ICD-10-CM | POA: Diagnosis not present

## 2016-03-18 DIAGNOSIS — S2243XA Multiple fractures of ribs, bilateral, initial encounter for closed fracture: Secondary | ICD-10-CM | POA: Diagnosis not present

## 2016-03-18 DIAGNOSIS — I10 Essential (primary) hypertension: Secondary | ICD-10-CM | POA: Diagnosis not present

## 2016-03-18 DIAGNOSIS — E039 Hypothyroidism, unspecified: Secondary | ICD-10-CM

## 2016-03-18 DIAGNOSIS — S82031A Displaced transverse fracture of right patella, initial encounter for closed fracture: Secondary | ICD-10-CM | POA: Diagnosis not present

## 2016-03-18 LAB — BASIC METABOLIC PANEL
ANION GAP: 9 (ref 5–15)
BUN: 24 mg/dL — ABNORMAL HIGH (ref 6–20)
CHLORIDE: 102 mmol/L (ref 101–111)
CO2: 26 mmol/L (ref 22–32)
Calcium: 9 mg/dL (ref 8.9–10.3)
Creatinine, Ser: 1.06 mg/dL — ABNORMAL HIGH (ref 0.44–1.00)
GFR calc non Af Amer: 43 mL/min — ABNORMAL LOW (ref >60)
GFR, EST AFRICAN AMERICAN: 50 mL/min — AB (ref >60)
Glucose, Bld: 110 mg/dL — ABNORMAL HIGH (ref 65–99)
POTASSIUM: 4 mmol/L (ref 3.5–5.1)
SODIUM: 137 mmol/L (ref 135–145)

## 2016-03-18 LAB — CBC
HEMATOCRIT: 38.5 % (ref 36.0–46.0)
HEMOGLOBIN: 13.1 g/dL (ref 12.0–15.0)
MCH: 32.8 pg (ref 26.0–34.0)
MCHC: 34 g/dL (ref 30.0–36.0)
MCV: 96.3 fL (ref 78.0–100.0)
Platelets: 355 10*3/uL (ref 150–400)
RBC: 4 MIL/uL (ref 3.87–5.11)
RDW: 13.7 % (ref 11.5–15.5)
WBC: 11.5 10*3/uL — AB (ref 4.0–10.5)

## 2016-03-18 LAB — MRSA PCR SCREENING: MRSA by PCR: NEGATIVE

## 2016-03-18 NOTE — Progress Notes (Signed)
[facility]  PROGRESS NOTE  [name] [id number] DOB: September 30, 1917 DOA: 03/17/2016 PCP: Jeanmarie Hubert, MD   Brief HPI:    83 y.o. female with medical history significant of dementia and hypertension who resides in a skilled nursing facility and reportedly sustained a mechanical fall 1 day prior on 2/22. This was not witnessed. There was no reported head injury. Patient had x-rays done at the facility which then came back and noted a right patellar fracture. Patient was then sent up to the emergency room for further evaluation. ED Course: Today, in the emergency room, x-ray of the right knee noted a complete transverse fracture of the mid patella. Patient also underwent a full set of CT films for evaluation of her trauma. She is found to have a chronic calcified mass in her pelvis which was unchanged. She was noted to have an incidental left renal lesion. She was noted to have a saccular aneurysm or descending drastically aorta which was approximately 3.4 cm. Patient also noted to have a chronic T12 compression fracture. We'll concerning for multiple displaced fractures of the left second and third as well as posterior left ninth and 11th and 12th and right 10th. With the findings for her knee and multiple rib fractures, orthopedic and trauma surgery were consulted. With the surgery recommended a knee immobilizer, or unclear given her advanced age and advanced dementia surgery would be appropriate. Trauma surgery had some recommendations, but felt that most she would need overnight observation. Given her advanced age, dementia and multiple surgical issues spanning multiple services, hospitalists were consulted for admission  Subjective   Patient  Seen and examined, nonverbal. Does not appear to be in pain.   Assessment/Plan:     1. Closed patella sleeve fracture of left knee-  surgery was initially planned for day but family was hesitant to go for surgery at this age and would like  to  wait for few weeks before Deciding for surgery. I called and discussed with Dr. Stann Mainland, who agrees with holding off surgery at this time, and observe with conservative management. Patient is weight bearing as tolerated with knee brace. 2. Fracture of  multiple ribs- Seen by trauma  surgery, continue incentive spirometry every hour while awake.Surprisingly patient is not in severe pain, continue Tylenol when necessary for pain. 3. Left renal mass: Incidentally noted. Could better evaluate with a dedicated MRI, however this would not necessarily change anything as aggressive intervention such as a nephrectomy she did indeed have a renal cell carcinoma would not be done on this patient given her advanced age, dementia and DO NOT RESUSCITATE status 4. Hypothyroidism- stable, continue Synthroid 5. 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 Hypertension- blood pressure is stable,Continue metoprolol 12.5 mg twice a day 7. Constipation- Continue MiraLAX When necessary    DVT prophylaxis: SCDs  Code Status: DO NOT RESSCITATE  Family Communication: Discussed with patient's daughter and son-in-law at bedside   Disposition Plan: Likely back to skilled Facility In next 24-48 hs   Consultants:  Orthopedic surgery  Trauma surgery  Procedures:  None     Antibiotics:   Anti-infectives    None       Objective   Vitals:   03/17/16 1619 03/17/16 2236 03/18/16 0613 03/18/16 1340  BP: (!) 147/93 (!) 148/90 (!) 159/57 (!) 146/62  Pulse: (!) 54 (!) 59 67 62  Resp:  16 18 18   Temp:  98 F (  36.7 C) 98.6 F (37 C) 98.2 F (36.8 C)  TempSrc:  Oral Oral Axillary  SpO2:  98% 94% 96%  Weight:      Height:        Intake/Output Summary (Last 24 hours) at 03/18/16 1535 Last data filed at 03/18/16 1413  Gross per 24 hour  Intake              480 ml  Output                0 ml  Net              480 ml   Filed  Weights   03/17/16 0843  Weight: 54.8 kg (120 lb 12.8 oz)     Physical Examination:  General exam: Appears calm and comfortable. Respiratory system: Clear to auscultation. Respiratory effort normal. Cardiovascular system:  RRR. No  murmurs, rubs, gallops. No pedal edema. GI system: Abdomen is nondistended, soft and nontender. No organomegaly.  Central nervous system. No focal neurological deficits. 5 x 5 power in all extremities. Skin: No rashes, lesions or ulcers. Psychiatry: Alert, Pleasantly confused    Data Reviewed: I have personally reviewed following labs and imaging studies  CBG: No results for input(s): GLUCAP in the last 168 hours.  CBC:  Recent Labs Lab 03/17/16 0907 03/18/16 0431  WBC 10.0 11.5*  NEUTROABS 6.8  --   HGB 13.8 13.1  HCT 40.5 38.5  MCV 96.4 96.3  PLT 337 [id number]    Basic Metabolic Panel:  Recent Labs Lab 03/17/16 0907 03/18/16 0847  NA 139 137  K 3.3* 4.0  CL 102 102  CO2 29 26  GLUCOSE 97 110*  BUN 17 24*  CREATININE 0.62 1.06*  CALCIUM 9.1 9.0    Recent Results (from the past 240 hour(s))  MRSA PCR Screening     Status: None   Collection Time: 03/18/16  2:00 AM  Result Value Ref Range Status   MRSA by PCR NEGATIVE NEGATIVE Final    Comment:        The GeneXpert MRSA Assay (FDA approved for NASAL specimens only), is one component of a comprehensive MRSA colonization surveillance program. It is not intended to diagnose MRSA infection nor to guide or monitor treatment for MRSA infections.      Liver Function Tests:  Recent Labs Lab 03/17/16 0907  AST 18  ALT 13*  ALKPHOS 59  BILITOT 1.1  PROT 6.9  ALBUMIN 4.2      Studies: Dg Chest 2 View  Result Date: 03/17/2016 CLINICAL DATA:  Witnessed fall last night. EXAM: CHEST  2 VIEW COMPARISON:  01/21/2016 FINDINGS: Frontal view degraded by motion. There is left ventricular prominence. There is chronic aortic atherosclerosis. Right lung is clear. There is abnormal  density in the left lower lobe that could be atelectasis and/or pneumonia. Compared to the previous study, there is slight further loss of height of a lower thoracic vertebral body. Old augmented compression fracture down lower appears unchanged. IMPRESSION: Left lower lobe atelectasis and/or pneumonia. Slight compression fracture in the mid to lower thoracic spine. Electronically Signed   By: Nelson Chimes M.D.   On: 03/17/2016 09:34   Ct Head Wo Contrast  Result Date: 03/17/2016 CLINICAL DATA:  Pain following fall.  Underlying dementia EXAM: CT HEAD WITHOUT CONTRAST CT CERVICAL SPINE WITHOUT CONTRAST TECHNIQUE: Multidetector CT imaging of the head and cervical spine was performed following the standard protocol without intravenous contrast. Multiplanar CT image reconstructions of the  cervical spine were also generated. COMPARISON:  CT head January 21, 2016; CT cervical spine November 10, 2015 FINDINGS: CT HEAD FINDINGS Brain: Moderate diffuse atrophy is stable. There is no evident intracranial mass, hemorrhage, extra-axial fluid collection, or midline shift. There is small vessel disease throughout the centra semiovale bilaterally, stable. No new gray-white compartment lesion is evident. No acute infarct appreciable. Calcification along the tentorium bilaterally is stable. Vascular: No hyper dense vessels. There is extensive carotid siphon calcification bilaterally as well as calcification in both distal vertebral arteries. Skull: The bony calvarium is intact. Sinuses/Orbits: There is opacification in a posterior right ethmoid air cell. There is slight mucosal thickening in several ethmoid air cells elsewhere. Other visualized paranasal sinuses are clear. Visualized orbits appear symmetric bilaterally. Note that the patient has had cataract removal on the right, stable. Other: Mastoid air cells are clear CT CERVICAL SPINE FINDINGS Alignment: There is cervical dextroscoliosis. There is just over 3 mm of  anterolisthesis of C3 on C4, slightly increased from prior study. There is just under 2 mm of anterolisthesis of C4 on C5, stable. No spondylolisthesis is evident elsewhere. Skull base and vertebrae: Skull base and craniocervical junction regions appear normal. There is moderate pannus posterior to the odontoid which is not causing appreciable impression on the craniocervical junction. There is no evident fracture. There are no blastic or lytic bone lesions. Soft tissues and spinal canal: The prevertebral soft tissues and predental space regions are normal. No paraspinous lesions are evident. There is no cord or canal hematoma evident. No spinal stenosis appreciable. Disc levels: There is marked disc space narrowing at C4-5. There is moderately severe disc space narrowing at C5-6 and C6-7. There is moderate disc space narrowing at C3-4 and C7-T1. There is facet hypertrophy at most levels bilaterally. There is no frank disc extrusion. Upper chest: There is aortic atherosclerosis. There is mild scarring in the lung apices superiorly. Other: There is calcification in both carotid arteries. There are multiple small nodular lesions in the thyroid without dominant thyroid mass evident. IMPRESSION: CT head: Stable atrophy with extensive supratentorial small vessel disease. No acute infarct evident. No mass, hemorrhage, or extra-axial fluid collection. Multiple foci of arteriovascular calcification present. There is ethmoid sinus disease, most notably in a posterior right ethmoid air cell, where there is opacification of much of this air cell. CT cervical spine: No fracture. Spondylolisthesis at C3-4 and C4-5. Note that there is slightly more anterolisthesis at C3-4 than on prior study. Spondylolisthesis at C4-5 stable. The spondylolisthesis at these levels is felt to be due to spondylosis underlying. There is multifocal arthropathy. No frank disc extrusion or high-grade stenosis. Aortic atherosclerosis. Bilateral carotid  artery calcification. Multinodular goiter without dominant thyroid mass. Electronically Signed   By: Lowella Grip III M.D.   On: 03/17/2016 09:44   Ct Chest W Contrast  Result Date: 03/17/2016 CLINICAL DATA:  Patient status post fall. Evaluate for traumatic injury. EXAM: CT CHEST, ABDOMEN, AND PELVIS WITH CONTRAST TECHNIQUE: Multidetector CT imaging of the chest, abdomen and pelvis was performed following the standard protocol during bolus administration of intravenous contrast. CONTRAST:  61mL ISOVUE-300 IOPAMIDOL (ISOVUE-300) INJECTION 61% COMPARISON:  MR pelvis 01/04/2010 FINDINGS: CT CHEST FINDINGS Cardiovascular: Heart is enlarged. No pericardial effusion. Peripheral atherosclerotic plaque involving the thoracic aorta. 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 The aorta measures up to 3.4 cm at this location (image 27; series 2). Mediastinum/Nodes: No enlarged axillary, mediastinal or hilar lymphadenopathy. Lungs/Pleura: Central airways are  patent. Dependent atelectasis within the bilateral lower lobes. Calcified granuloma within the left upper lobe. 4 mm right middle lobe nodule (image 74; series 4). No pleural effusion or pneumothorax. Musculoskeletal: Mildly displaced fracture through the anterior left second and third ribs (image 47; series 4) (image 70; series 4). Nondisplaced posterior left ninth, twelfth and eleventh rib fractures. Chronic appearing T12 compression deformity with kyphoplasty material. Narrowing of the spinal canal at this level. Nondisplaced right tenth rib fracture. CT ABDOMEN PELVIS FINDINGS Hepatobiliary: Liver is normal in size and contour. Gallbladder is unremarkable. No intrahepatic or extrahepatic biliary ductal dilatation. Pancreas: Unremarkable Spleen: Unremarkable Adrenals/Urinary Tract: The adrenal glands are normal. Kidneys enhance symmetrically with contrast. There is a 1.7 x 1.8 cm exophytic lesion off the interpolar region of the left  kidney within internal density of 49 Hounsfield units (image 58; series 2). Urinary bladder is dilated. Stomach/Bowel: Pancolonic diverticulosis. No CT evidence for acute diverticulitis. No free fluid or free intraperitoneal air. No evidence for bowel obstruction. Vascular/Lymphatic: Peripheral calcified atherosclerotic plaque involving the abdominal aorta. No retroperitoneal lymphadenopathy. Reproductive: There is a large partially calcified right adnexal mass and associated cyst. Cyst measures up to 4 cm stone in dimension. Sizes this mass appears similar to prior MRI 01/04/2010. Other: None. Musculoskeletal: Old left inferior pubic ramus fracture. Osseous demineralization. Lumbar spine degenerative changes. No aggressive or acute appearing osseous lesions. IMPRESSION: Left anterior second and third mildly displaced rib fractures. Nondisplaced posterior left ninth, eleventh and twelfth rib fractures. Nondisplaced right tenth rib fracture. Chronic appearing T12 compression deformity with kyphoplasty material. There is narrowing of the spinal canal at this level. Otherwise no acute process within the chest, abdomen or pelvis. Indeterminate dense exophytic mass off the interpolar region of the left kidney. This may represent a complicated cyst or solid renal mass. Recommend dedicated evaluation with pre and post contrast-enhanced MRI or CT in the non acute setting. 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 Given the patient's age, this is likely of doubtful clinical significance however vascular consultation can be performed as clinically indicated. Indeterminate calcified and cystic mass within the right adnexa. Grossly this appears similar in size when compared to prior MRI exams. This is nonspecific in etiology however may be benign given stability over time. Pancolonic diverticulosis. Electronically Signed   By: Lovey Newcomer M.D.   On: 03/17/2016  11:34   Ct Cervical Spine Wo Contrast  Result Date: 03/17/2016 CLINICAL DATA:  Pain following fall.  Underlying dementia EXAM: CT HEAD WITHOUT CONTRAST CT CERVICAL SPINE WITHOUT CONTRAST TECHNIQUE: Multidetector CT imaging of the head and cervical spine was performed following the standard protocol without intravenous contrast. Multiplanar CT image reconstructions of the cervical spine were also generated. COMPARISON:  CT head January 21, 2016; CT cervical spine November 10, 2015 FINDINGS: CT HEAD FINDINGS Brain: Moderate diffuse atrophy is stable. There is no evident intracranial mass, hemorrhage, extra-axial fluid collection, or midline shift. There is small vessel disease throughout the centra semiovale bilaterally, stable. No new gray-white compartment lesion is evident. No acute infarct appreciable. Calcification along the tentorium bilaterally is stable. Vascular: No hyper dense vessels. There is extensive carotid siphon calcification bilaterally as well as calcification in both distal vertebral arteries. Skull: The bony calvarium is intact. Sinuses/Orbits: There is opacification in a posterior right ethmoid air cell. There is slight mucosal thickening in several ethmoid air cells elsewhere. Other visualized paranasal sinuses are clear. Visualized orbits appear symmetric bilaterally. Note that the  patient has had cataract removal on the right, stable. Other: Mastoid air cells are clear CT CERVICAL SPINE FINDINGS Alignment: There is cervical dextroscoliosis. There is just over 3 mm of anterolisthesis of C3 on C4, slightly increased from prior study. There is just under 2 mm of anterolisthesis of C4 on C5, stable. No spondylolisthesis is evident elsewhere. Skull base and vertebrae: Skull base and craniocervical junction regions appear normal. There is moderate pannus posterior to the odontoid which is not causing appreciable impression on the craniocervical junction. There is no evident fracture. There are  no blastic or lytic bone lesions. Soft tissues and spinal canal: The prevertebral soft tissues and predental space regions are normal. No paraspinous lesions are evident. There is no cord or canal hematoma evident. No spinal stenosis appreciable. Disc levels: There is marked disc space narrowing at C4-5. There is moderately severe disc space narrowing at C5-6 and C6-7. There is moderate disc space narrowing at C3-4 and C7-T1. There is facet hypertrophy at most levels bilaterally. There is no frank disc extrusion. Upper chest: There is aortic atherosclerosis. There is mild scarring in the lung apices superiorly. Other: There is calcification in both carotid arteries. There are multiple small nodular lesions in the thyroid without dominant thyroid mass evident. IMPRESSION: CT head: Stable atrophy with extensive supratentorial small vessel disease. No acute infarct evident. No mass, hemorrhage, or extra-axial fluid collection. Multiple foci of arteriovascular calcification present. There is ethmoid sinus disease, most notably in a posterior right ethmoid air cell, where there is opacification of much of this air cell. CT cervical spine: No fracture. Spondylolisthesis at C3-4 and C4-5. Note that there is slightly more anterolisthesis at C3-4 than on prior study. Spondylolisthesis at C4-5 stable. The spondylolisthesis at these levels is felt to be due to spondylosis underlying. There is multifocal arthropathy. No frank disc extrusion or high-grade stenosis. Aortic atherosclerosis. Bilateral carotid artery calcification. Multinodular goiter without dominant thyroid mass. Electronically Signed   By: Lowella Grip III M.D.   On: 03/17/2016 09:44   Ct Thoracic Spine Wo Contrast  Result Date: 03/17/2016 CLINICAL DATA:  Back pain, fell last night, dementia EXAM: CT THORACIC AND LUMBAR SPINE WITHOUT CONTRAST TECHNIQUE: Multidetector CT imaging of the thoracic and lumbar spine was performed without contrast. Multiplanar  CT image reconstructions were also generated. COMPARISON:  Chest radiograph 01/21/2016, MR pelvis 01/04/2010 FINDINGS: CT THORACIC SPINE FINDINGS Alignment: 12 rib pairs. Mild dextroconvex thoracic scoliosis. Otherwise normal. Vertebrae: Marked osseous demineralization. Chronic marked compression fracture of T12 vertebral body with retropulsion of a posterior fragment, significant AP narrowing of the spinal canal, and evidence of spinal augmentation procedure. Remaining vertebral body heights maintained. Few scattered endplate spurs and mild disc space narrowing at mid thoracic levels. No acute thoracic spine fracture or subluxation Paraspinal and other soft tissues: Extensive atherosclerotic calcification aorta with aneurysmal dilatation of the descending thoracic aorta to 3.2 x 3.5 cm image 68. Coronary arterial calcifications. Heart appears enlarged. No definite thoracic adenopathy. Dependent atelectasis in both lungs. Calcified granuloma LEFT upper lobe. Disc levels: Grossly unremarkable. CT LUMBAR SPINE FINDINGS Segmentation: 5 non-rib-bearing lumbar type vertebra. Alignment: Normal Vertebrae: Marked osseous demineralization. Vertebral body heights maintained without acute fracture or subluxation. Multilevel facet degenerative changes. SI joints appear symmetric and preserved. No obvious sacral fracture. Paraspinal and other soft tissues: Extensive atherosclerotic calcifications abdominal aorta and iliac arteries. Partially calcified mass in the RIGHT pelvis, 6.9 x 5.3 x 8.2 cm, uncertain if arising from uterus or adnexa diet present since and  grossly unchanged since an MRI of the pelvis from 2011. Disc levels: Diffusely narrowed.  No gross disc herniation. IMPRESSION: CT THORACIC SPINE IMPRESSION Marked osseous demineralization. Old T12 compression fracture and vertebroplasty with stable retropulsion of a dominant fragment and AP narrowing of the spinal canal. No acute thoracic spine abnormalities. CT LUMBAR  SPINE IMPRESSION Osseous demineralization with degenerative disc and facet disease changes lumbar spine. No acute lumbar spine abnormalities. Partially calcified 6.9 x 5.3 x 8.2 cm diameter mass within the RIGHT pelvis, present since 2011, either of RIGHT ovarian or less likely uterine origin, favor benign in light of time this has been present and similar in appearance. Extensive aortic atherosclerosis with mild aneurysmal dilatation of the descending thoracic aorta up to 3.2 x 3.5 cm. Coronary arterial calcifications. Electronically Signed   By: Lavonia Dana M.D.   On: 03/17/2016 11:51   Ct Lumbar Spine Wo Contrast  Result Date: 03/17/2016 CLINICAL DATA:  Back pain, fell last night, dementia EXAM: CT THORACIC AND LUMBAR SPINE WITHOUT CONTRAST TECHNIQUE: Multidetector CT imaging of the thoracic and lumbar spine was performed without contrast. Multiplanar CT image reconstructions were also generated. COMPARISON:  Chest radiograph 01/21/2016, MR pelvis 01/04/2010 FINDINGS: CT THORACIC SPINE FINDINGS Alignment: 12 rib pairs. Mild dextroconvex thoracic scoliosis. Otherwise normal. Vertebrae: Marked osseous demineralization. Chronic marked compression fracture of T12 vertebral body with retropulsion of a posterior fragment, significant AP narrowing of the spinal canal, and evidence of spinal augmentation procedure. Remaining vertebral body heights maintained. Few scattered endplate spurs and mild disc space narrowing at mid thoracic levels. No acute thoracic spine fracture or subluxation Paraspinal and other soft tissues: Extensive atherosclerotic calcification aorta with aneurysmal dilatation of the descending thoracic aorta to 3.2 x 3.5 cm image 68. Coronary arterial calcifications. Heart appears enlarged. No definite thoracic adenopathy. Dependent atelectasis in both lungs. Calcified granuloma LEFT upper lobe. Disc levels: Grossly unremarkable. CT LUMBAR SPINE FINDINGS Segmentation: 5 non-rib-bearing lumbar type  vertebra. Alignment: Normal Vertebrae: Marked osseous demineralization. Vertebral body heights maintained without acute fracture or subluxation. Multilevel facet degenerative changes. SI joints appear symmetric and preserved. No obvious sacral fracture. Paraspinal and other soft tissues: Extensive atherosclerotic calcifications abdominal aorta and iliac arteries. Partially calcified mass in the RIGHT pelvis, 6.9 x 5.3 x 8.2 cm, uncertain if arising from uterus or adnexa diet present since and grossly unchanged since an MRI of the pelvis from 2011. Disc levels: Diffusely narrowed.  No gross disc herniation. IMPRESSION: CT THORACIC SPINE IMPRESSION Marked osseous demineralization. Old T12 compression fracture and vertebroplasty with stable retropulsion of a dominant fragment and AP narrowing of the spinal canal. No acute thoracic spine abnormalities. CT LUMBAR SPINE IMPRESSION Osseous demineralization with degenerative disc and facet disease changes lumbar spine. No acute lumbar spine abnormalities. Partially calcified 6.9 x 5.3 x 8.2 cm diameter mass within the RIGHT pelvis, present since 2011, either of RIGHT ovarian or less likely uterine origin, favor benign in light of time this has been present and similar in appearance. Extensive aortic atherosclerosis with mild aneurysmal dilatation of the descending thoracic aorta up to 3.2 x 3.5 cm. Coronary arterial calcifications. Electronically Signed   By: Lavonia Dana M.D.   On: 03/17/2016 11:51   Ct Abdomen Pelvis W Contrast  Result Date: 03/17/2016 CLINICAL DATA:  Patient status post fall. Evaluate for traumatic injury. EXAM: CT CHEST, ABDOMEN, AND PELVIS WITH CONTRAST TECHNIQUE: Multidetector CT imaging of the chest, abdomen and pelvis was performed following the standard protocol during bolus administration of intravenous contrast.  CONTRAST:  53mL ISOVUE-300 IOPAMIDOL (ISOVUE-300) INJECTION 61% COMPARISON:  MR pelvis 01/04/2010 FINDINGS: CT CHEST FINDINGS  Cardiovascular: Heart is enlarged. No pericardial effusion. Peripheral atherosclerotic plaque involving the thoracic aorta. 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 The aorta measures up to 3.4 cm at this location (image 27; series 2). Mediastinum/Nodes: No enlarged axillary, mediastinal or hilar lymphadenopathy. Lungs/Pleura: Central airways are patent. Dependent atelectasis within the bilateral lower lobes. Calcified granuloma within the left upper lobe. 4 mm right middle lobe nodule (image 74; series 4). No pleural effusion or pneumothorax. Musculoskeletal: Mildly displaced fracture through the anterior left second and third ribs (image 47; series 4) (image 70; series 4). Nondisplaced posterior left ninth, twelfth and eleventh rib fractures. Chronic appearing T12 compression deformity with kyphoplasty material. Narrowing of the spinal canal at this level. Nondisplaced right tenth rib fracture.   CT ABDOMEN PELVIS FINDINGS  IMPRESSION: Left anterior second and third mildly displaced rib fractures. Nondisplaced posterior left ninth, eleventh and twelfth rib fractures. Nondisplaced right tenth rib fracture. Chronic appearing T12 compression deformity with kyphoplasty material. There is narrowing of the spinal canal at this level. Otherwise no acute process within the chest, abdomen or pelvis. Indeterminate dense exophytic mass off the interpolar region of the left kidney. This may represent a complicated cyst or solid renal mass. Recommend dedicated evaluation with pre and post contrast-enhanced MRI or CT in the non acute setting. 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 Given the patient's age, this is likely of doubtful clinical significance however vascular consultation can be performed as clinically indicated. Indeterminate calcified and cystic mass within the right adnexa. Grossly this appears similar in size when  compared to prior MRI exams. This is nonspecific in etiology however may be benign given stability over time. Pancolonic diverticulosis. Electronically Signed   By: Lovey Newcomer M.D.   On: 03/17/2016 11:34   Dg Knee Complete 4 Views Right  Result Date: 03/17/2016 CLINICAL DATA:  Witnessed fall last night.  Pain. EXAM: RIGHT KNEE - COMPLETE 4+ VIEW COMPARISON:  None. FINDINGS: Complete transverse fracture of the patella, displaced a few mm with slight offset. No fracture of the femur, tibia or fibula. Small lipoma hemarthrosis. IMPRESSION: Complete transverse fracture of the mid patella. Electronically Signed   By: Nelson Chimes M.D.   On: 03/17/2016 09:37   Dg Hip Unilat With Pelvis 2-3 Views Right  Result Date: 03/17/2016 CLINICAL DATA:  Witnessed fall last night. EXAM: DG HIP (WITH OR WITHOUT PELVIS) 2-3V RIGHT COMPARISON:  None. FINDINGS: No evidence of pelvic or hip fracture. Extensive pelvic calcifications presumed represent chronic benign uterine disease. Old healed rami fractures on the left. IMPRESSION: No acute finding. Electronically Signed   By: Nelson Chimes M.D.   On: 03/17/2016 09:35   Dg Femur, Min 2 Views Right  Result Date: 03/17/2016 CLINICAL DATA:  Witnessed fall last night.  Pain. EXAM: RIGHT FEMUR 2 VIEWS COMPARISON:  None. FINDINGS: There is no evidence of fracture or other focal bone lesions. Soft tissues are unremarkable. IMPRESSION: Negative. Electronically Signed   By: Nelson Chimes M.D.   On: 03/17/2016 09:36    Scheduled Meds: . citalopram  10 mg Oral Daily  . levothyroxine  25 mcg Oral QAC breakfast  . metoprolol tartrate  12.5 mg Oral BID  . polyethylene glycol  17 g Oral Daily  . risperiDONE  0.5 mg Oral QHS      Time spent: 25  min  [facility] Pager [phone]. If 7PM-7AM, please contact night-coverage at [email or web address], Office  [phone]  password [email or web address] 03/18/2016, 3:35 PM  LOS: 0 days

## 2016-03-18 NOTE — Progress Notes (Signed)
PT Cancellation Note  Patient Details Name: [name] MRN: [id number] DOB: 1917-05-11    Cancelled Treatment:    Reason Eval/Treat Not Completed: Fatigue/lethargy limiting ability to participate Pt sleeping this afternoon, per RN, had been attempting to get OOB last night and this morning.  Will check back as schedule permits.  Noted pt able to perform WBAT with KI in place per ortho note, and possible patellar surgery pending for Monday.   Thyra Yinger,KATHrine E 03/18/2016, 3:03 PM Carmelia Bake, PT, DPT 03/18/2016 Pager: [phone]

## 2016-03-18 NOTE — Progress Notes (Signed)
Fracture of multiple ribs  Subjective: Pain appears to be controlled, pt is a poor historian  Objective: Vital signs in last 24 hours: Temp:  [97.6 F (36.4 C)-98.6 F (37 C)] 98.6 F (37 C) (02/24 [id number]) Pulse Rate:  [50-67] 67 (02/24 0613) Resp:  [11-19] 18 (02/24 0613) BP: (139-169)/(53-93) 159/57 (02/24 0613) SpO2:  [94 %-98 %] 94 % (02/24 [id number]) Last BM Date:  (unknown)  Intake/Output from previous day: 02/23 0701 - 02/24 0700 In: 360 [P.O.:360] Out: -  Intake/Output this shift: Total I/O In: 120 [P.O.:120] Out: -   General appearance: distracted and no distress Resp: clear to auscultation bilaterally Chest wall: no tenderness, left sided chest wall tenderness GI: normal findings: soft, non-tender  Lab Results:  Results for orders placed or performed during the hospital encounter of 03/17/16 (from the past 24 hour(s))  MRSA PCR Screening     Status: None   Collection Time: 03/18/16  2:00 AM  Result Value Ref Range   MRSA by PCR NEGATIVE NEGATIVE  CBC     Status: Abnormal   Collection Time: 03/18/16  4:31 AM  Result Value Ref Range   WBC 11.5 (H) 4.0 - 10.5 K/uL   RBC 4.00 3.87 - 5.11 MIL/uL   Hemoglobin 13.1 12.0 - 15.0 g/dL   HCT 38.5 36.0 - 46.0 %   MCV 96.3 78.0 - 100.0 fL   MCH 32.8 26.0 - 34.0 pg   MCHC 34.0 30.0 - 36.0 g/dL   RDW 13.7 11.5 - 15.5 %   Platelets 355 150 - 400 K/uL  Basic metabolic panel     Status: Abnormal   Collection Time: 03/18/16  8:47 AM  Result Value Ref Range   Sodium 137 135 - 145 mmol/L   Potassium 4.0 3.5 - 5.1 mmol/L   Chloride 102 101 - 111 mmol/L   CO2 26 22 - 32 mmol/L   Glucose, Bld 110 (H) 65 - 99 mg/dL   BUN 24 (H) 6 - 20 mg/dL   Creatinine, Ser 1.06 (H) 0.44 - 1.00 mg/dL   Calcium 9.0 8.9 - 10.3 mg/dL   GFR calc non Af Amer 43 (L) >60 mL/min   GFR calc Af Amer 50 (L) >60 mL/min   Anion gap 9 5 - 15     Studies/Results Radiology     MEDS, Scheduled . citalopram  10 mg Oral Daily  . levothyroxine  25  mcg Oral QAC breakfast  . metoprolol tartrate  12.5 mg Oral BID  . polyethylene glycol  17 g Oral Daily  . risperiDONE  0.5 mg Oral QHS     Assessment: Fracture of multiple ribs Patellar fracture  Plan: Incentive spirometry q1h while awake Maintain adequate pain control for deep breathing Patellar fracture per Ortho recs   LOS: 0 days    Rosario Adie, MD [facility], [place] [phone]   03/18/2016 10:18 AM

## 2016-03-18 NOTE — Consult Note (Signed)
ORTHOPAEDIC CONSULTATION  REQUESTING PHYSICIAN: Oswald Hillock, MD  PCP:  Jeanmarie Hubert, MD  Chief Complaint: Right patella fracture  HPI: [name] is a 83 y.o. female who complains of fall at her nursing facility.  The patient has fairly advanced dementia and the history and subjective component of this note was obtained via conversation with her daughter on the phone.  She lives at an assisted living facility here in [place] and does ambulate independently throughout the day. She does have a history of recent falls and is under the care of the facility with physical therapy as well as nursing aides. From a medical comorbidities standpoint she does not have many issues that her daughter is aware of in terms of heart lung or kidney.  Past Medical History:  Diagnosis Date  . Abnormality of gait 03/15/2010  . Anxiety state, unspecified 03/15/2010  . Depression, major 06/15/2015  . Depressive disorder, not elsewhere classified 03/15/2010  . Dysphagia, unspecified(787.20) 03/15/2010  . Generalized anxiety disorder 06/23/2014  . Herpes zoster without mention of complication [id number]  . Other alteration of consciousness 03/15/2010  . Pain in hand 08/13/12  . Personal history of fall 03/15/2010  . Senile osteoporosis 03/15/2010  . Unspecified constipation 03/15/2010  . Unspecified essential hypertension 03/15/2010  . Unspecified hearing loss 08/22/2011  . Unspecified urinary incontinence 03/15/2010  . Urinary incontinence 03/15/2010  . Vaginal prolapse 06/15/2015   Past Surgical History:  Procedure Laterality Date  . INCISION / DRAINAGE HAND / FINGER Right 06/2008   hand  . SPINE SURGERY  12/2006   Social History   Social History  . Marital status: Divorced    Spouse name: N/A  . Number of children: N/A  . Years of education: N/A   Occupational History  . [employer]    Social History Main Topics  . Smoking status: Never Smoker  . Smokeless tobacco: Never Used  . Alcohol  use Yes     Comment: 2-4 oz of wine once daily at request  . Drug use: No  . Sexual activity: No   Other Topics Concern  . None   Social History Narrative   Lives at [facility], in [place] since 12/28/2014   Widowed   [place]   Never smoked   Alcohol wine occasionally    Exercise walks a lot, with walker   POA, Living Will   Family History  Problem Relation Age of Onset  . Heart disease Father     MI   Allergies  Allergen Reactions  . Ace Inhibitors Other (See Comments)    Reaction unknown/ listed on MAR  . Hydrochlorothiazide Other (See Comments)    Reaction unknown/ listed on MAR  . Morphine And Related Other (See Comments)    Hallucinations   Prior to Admission medications   Medication Sig Start Date End Date Taking? Authorizing Provider  aspirin EC 81 MG tablet Take 1 tablet (81 mg total) by mouth daily. 12/11/14  Yes Theodis Blaze, MD  Calcium Carbonate-Vitamin D (CALCIUM 600+D) 600-200 MG-UNIT TABS Take 1 tablet by mouth daily.    Yes Historical Provider, MD  citalopram (CELEXA) 10 MG tablet One daily to help depression and anxiety Patient taking differently: Take 10 mg by mouth daily. to help depression and anxiety 06/15/15  Yes Estill Dooms, MD  Cobalamine Combinations (VITAMIN B12-FOLIC ACID) XX123456 MCG TABS Take 1 tablet by mouth daily with breakfast.   Yes Historical Provider, MD  hydrocortisone (PROCTO-MED HC) 2.5 %  rectal cream Place 1 application rectally every 12 (twelve) hours as needed for hemorrhoids or itching.   Yes Historical Provider, MD  ibuprofen (ADVIL,MOTRIN) 200 MG tablet Take 400 mg by mouth every 6 (six) hours as needed (for pain).   Yes Historical Provider, MD  levothyroxine (SYNTHROID, LEVOTHROID) 25 MCG tablet Take 25 mcg by mouth daily before breakfast.   Yes Historical Provider, MD  LORazepam (ATIVAN) 0.5 MG tablet Take 0.5 mg by mouth every 8 (eight) hours as needed for anxiety.   Yes Historical Provider, MD  metoprolol tartrate  (LOPRESSOR) 25 MG tablet Take 12.5 mg by mouth 2 (two) times daily.   Yes Historical Provider, MD  mineral oil-hydrophilic petrolatum (AQUAPHOR) ointment Apply 1 application topically daily as needed for dry skin.   Yes Historical Provider, MD  polyethylene glycol (MIRALAX / GLYCOLAX) packet Take 17 g by mouth daily. 01/24/16  Yes Eber Jones, MD  risperiDONE (RISPERDAL) 0.5 MG tablet One at bedtime to prevent hallucinations and anxiety Patient taking differently: Take 0.5 mg by mouth at bedtime. to prevent hallucinations and anxiety 06/22/15  Yes Estill Dooms, MD  vitamin B-12 (CYANOCOBALAMIN) 1000 MCG tablet Take 1,000 mcg by mouth daily.   Yes Historical Provider, MD   Dg Chest 2 View  Result Date: 03/17/2016 CLINICAL DATA:  Witnessed fall last night. EXAM: CHEST  2 VIEW COMPARISON:  01/21/2016 FINDINGS: Frontal view degraded by motion. There is left ventricular prominence. There is chronic aortic atherosclerosis. Right lung is clear. There is abnormal density in the left lower lobe that could be atelectasis and/or pneumonia. Compared to the previous study, there is slight further loss of height of a lower thoracic vertebral body. Old augmented compression fracture down lower appears unchanged. IMPRESSION: Left lower lobe atelectasis and/or pneumonia. Slight compression fracture in the mid to lower thoracic spine. Electronically Signed   By: Nelson Chimes M.D.   On: 03/17/2016 09:34   Ct Head Wo Contrast  Result Date: 03/17/2016 CLINICAL DATA:  Pain following fall.  Underlying dementia EXAM: CT HEAD WITHOUT CONTRAST CT CERVICAL SPINE WITHOUT CONTRAST TECHNIQUE: Multidetector CT imaging of the head and cervical spine was performed following the standard protocol without intravenous contrast. Multiplanar CT image reconstructions of the cervical spine were also generated. COMPARISON:  CT head January 21, 2016; CT cervical spine November 10, 2015 FINDINGS: CT HEAD FINDINGS Brain: Moderate diffuse  atrophy is stable. There is no evident intracranial mass, hemorrhage, extra-axial fluid collection, or midline shift. There is small vessel disease throughout the centra semiovale bilaterally, stable. No new gray-white compartment lesion is evident. No acute infarct appreciable. Calcification along the tentorium bilaterally is stable. Vascular: No hyper dense vessels. There is extensive carotid siphon calcification bilaterally as well as calcification in both distal vertebral arteries. Skull: The bony calvarium is intact. Sinuses/Orbits: There is opacification in a posterior right ethmoid air cell. There is slight mucosal thickening in several ethmoid air cells elsewhere. Other visualized paranasal sinuses are clear. Visualized orbits appear symmetric bilaterally. Note that the patient has had cataract removal on the right, stable. Other: Mastoid air cells are clear CT CERVICAL SPINE FINDINGS Alignment: There is cervical dextroscoliosis. There is just over 3 mm of anterolisthesis of C3 on C4, slightly increased from prior study. There is just under 2 mm of anterolisthesis of C4 on C5, stable. No spondylolisthesis is evident elsewhere. Skull base and vertebrae: Skull base and craniocervical junction regions appear normal. There is moderate pannus posterior to the odontoid which is not  causing appreciable impression on the craniocervical junction. There is no evident fracture. There are no blastic or lytic bone lesions. Soft tissues and spinal canal: The prevertebral soft tissues and predental space regions are normal. No paraspinous lesions are evident. There is no cord or canal hematoma evident. No spinal stenosis appreciable. Disc levels: There is marked disc space narrowing at C4-5. There is moderately severe disc space narrowing at C5-6 and C6-7. There is moderate disc space narrowing at C3-4 and C7-T1. There is facet hypertrophy at most levels bilaterally. There is no frank disc extrusion. Upper chest: There is  aortic atherosclerosis. There is mild scarring in the lung apices superiorly. Other: There is calcification in both carotid arteries. There are multiple small nodular lesions in the thyroid without dominant thyroid mass evident. IMPRESSION: CT head: Stable atrophy with extensive supratentorial small vessel disease. No acute infarct evident. No mass, hemorrhage, or extra-axial fluid collection. Multiple foci of arteriovascular calcification present. There is ethmoid sinus disease, most notably in a posterior right ethmoid air cell, where there is opacification of much of this air cell. CT cervical spine: No fracture. Spondylolisthesis at C3-4 and C4-5. Note that there is slightly more anterolisthesis at C3-4 than on prior study. Spondylolisthesis at C4-5 stable. The spondylolisthesis at these levels is felt to be due to spondylosis underlying. There is multifocal arthropathy. No frank disc extrusion or high-grade stenosis. Aortic atherosclerosis. Bilateral carotid artery calcification. Multinodular goiter without dominant thyroid mass. Electronically Signed   By: Lowella Grip III M.D.   On: 03/17/2016 09:44   Ct Chest W Contrast  Result Date: 03/17/2016 CLINICAL DATA:  Patient status post fall. Evaluate for traumatic injury. EXAM: CT CHEST, ABDOMEN, AND PELVIS WITH CONTRAST TECHNIQUE: Multidetector CT imaging of the chest, abdomen and pelvis was performed following the standard protocol during bolus administration of intravenous contrast. CONTRAST:  19mL ISOVUE-300 IOPAMIDOL (ISOVUE-300) INJECTION 61% COMPARISON:  MR pelvis 01/04/2010 FINDINGS: CT CHEST FINDINGS Cardiovascular: Heart is enlarged. No pericardial effusion. Peripheral atherosclerotic plaque involving the thoracic aorta. 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 The aorta measures up to 3.4 cm at this location (image 27; series 2). Mediastinum/Nodes: No enlarged axillary, mediastinal or hilar lymphadenopathy.  Lungs/Pleura: Central airways are patent. Dependent atelectasis within the bilateral lower lobes. Calcified granuloma within the left upper lobe. 4 mm right middle lobe nodule (image 74; series 4). No pleural effusion or pneumothorax. Musculoskeletal: Mildly displaced fracture through the anterior left second and third ribs (image 47; series 4) (image 70; series 4). Nondisplaced posterior left ninth, twelfth and eleventh rib fractures. Chronic appearing T12 compression deformity with kyphoplasty material. Narrowing of the spinal canal at this level. Nondisplaced right tenth rib fracture. CT ABDOMEN PELVIS FINDINGS Hepatobiliary: Liver is normal in size and contour. Gallbladder is unremarkable. No intrahepatic or extrahepatic biliary ductal dilatation. Pancreas: Unremarkable Spleen: Unremarkable Adrenals/Urinary Tract: The adrenal glands are normal. Kidneys enhance symmetrically with contrast. There is a 1.7 x 1.8 cm exophytic lesion off the interpolar region of the left kidney within internal density of 49 Hounsfield units (image 58; series 2). Urinary bladder is dilated. Stomach/Bowel: Pancolonic diverticulosis. No CT evidence for acute diverticulitis. No free fluid or free intraperitoneal air. No evidence for bowel obstruction. Vascular/Lymphatic: Peripheral calcified atherosclerotic plaque involving the abdominal aorta. No retroperitoneal lymphadenopathy. Reproductive: There is a large partially calcified right adnexal mass and associated cyst. Cyst measures up to 4 cm stone in dimension. Sizes this mass appears similar to prior MRI 01/04/2010. Other:  None. Musculoskeletal: Old left inferior pubic ramus fracture. Osseous demineralization. Lumbar spine degenerative changes. No aggressive or acute appearing osseous lesions. IMPRESSION: Left anterior second and third mildly displaced rib fractures. Nondisplaced posterior left ninth, eleventh and twelfth rib fractures. Nondisplaced right tenth rib fracture. Chronic  appearing T12 compression deformity with kyphoplasty material. There is narrowing of the spinal canal at this level. Otherwise no acute process within the chest, abdomen or pelvis. Indeterminate dense exophytic mass off the interpolar region of the left kidney. This may represent a complicated cyst or solid renal mass. Recommend dedicated evaluation with pre and post contrast-enhanced MRI or CT in the non acute setting. 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 Given the patient's age, this is likely of doubtful clinical significance however vascular consultation can be performed as clinically indicated. Indeterminate calcified and cystic mass within the right adnexa. Grossly this appears similar in size when compared to prior MRI exams. This is nonspecific in etiology however may be benign given stability over time. Pancolonic diverticulosis. Electronically Signed   By: Lovey Newcomer M.D.   On: 03/17/2016 11:34   Ct Cervical Spine Wo Contrast  Result Date: 03/17/2016 CLINICAL DATA:  Pain following fall.  Underlying dementia EXAM: CT HEAD WITHOUT CONTRAST CT CERVICAL SPINE WITHOUT CONTRAST TECHNIQUE: Multidetector CT imaging of the head and cervical spine was performed following the standard protocol without intravenous contrast. Multiplanar CT image reconstructions of the cervical spine were also generated. COMPARISON:  CT head January 21, 2016; CT cervical spine November 10, 2015 FINDINGS: CT HEAD FINDINGS Brain: Moderate diffuse atrophy is stable. There is no evident intracranial mass, hemorrhage, extra-axial fluid collection, or midline shift. There is small vessel disease throughout the centra semiovale bilaterally, stable. No new gray-white compartment lesion is evident. No acute infarct appreciable. Calcification along the tentorium bilaterally is stable. Vascular: No hyper dense vessels. There is extensive carotid siphon calcification  bilaterally as well as calcification in both distal vertebral arteries. Skull: The bony calvarium is intact. Sinuses/Orbits: There is opacification in a posterior right ethmoid air cell. There is slight mucosal thickening in several ethmoid air cells elsewhere. Other visualized paranasal sinuses are clear. Visualized orbits appear symmetric bilaterally. Note that the patient has had cataract removal on the right, stable. Other: Mastoid air cells are clear CT CERVICAL SPINE FINDINGS Alignment: There is cervical dextroscoliosis. There is just over 3 mm of anterolisthesis of C3 on C4, slightly increased from prior study. There is just under 2 mm of anterolisthesis of C4 on C5, stable. No spondylolisthesis is evident elsewhere. Skull base and vertebrae: Skull base and craniocervical junction regions appear normal. There is moderate pannus posterior to the odontoid which is not causing appreciable impression on the craniocervical junction. There is no evident fracture. There are no blastic or lytic bone lesions. Soft tissues and spinal canal: The prevertebral soft tissues and predental space regions are normal. No paraspinous lesions are evident. There is no cord or canal hematoma evident. No spinal stenosis appreciable. Disc levels: There is marked disc space narrowing at C4-5. There is moderately severe disc space narrowing at C5-6 and C6-7. There is moderate disc space narrowing at C3-4 and C7-T1. There is facet hypertrophy at most levels bilaterally. There is no frank disc extrusion. Upper chest: There is aortic atherosclerosis. There is mild scarring in the lung apices superiorly. Other: There is calcification in both carotid arteries. There are multiple small nodular lesions in the thyroid without dominant thyroid mass  evident. IMPRESSION: CT head: Stable atrophy with extensive supratentorial small vessel disease. No acute infarct evident. No mass, hemorrhage, or extra-axial fluid collection. Multiple foci of  arteriovascular calcification present. There is ethmoid sinus disease, most notably in a posterior right ethmoid air cell, where there is opacification of much of this air cell. CT cervical spine: No fracture. Spondylolisthesis at C3-4 and C4-5. Note that there is slightly more anterolisthesis at C3-4 than on prior study. Spondylolisthesis at C4-5 stable. The spondylolisthesis at these levels is felt to be due to spondylosis underlying. There is multifocal arthropathy. No frank disc extrusion or high-grade stenosis. Aortic atherosclerosis. Bilateral carotid artery calcification. Multinodular goiter without dominant thyroid mass. Electronically Signed   By: Lowella Grip III M.D.   On: 03/17/2016 09:44   Ct Thoracic Spine Wo Contrast  Result Date: 03/17/2016 CLINICAL DATA:  Back pain, fell last night, dementia EXAM: CT THORACIC AND LUMBAR SPINE WITHOUT CONTRAST TECHNIQUE: Multidetector CT imaging of the thoracic and lumbar spine was performed without contrast. Multiplanar CT image reconstructions were also generated. COMPARISON:  Chest radiograph 01/21/2016, MR pelvis 01/04/2010 FINDINGS: CT THORACIC SPINE FINDINGS Alignment: 12 rib pairs. Mild dextroconvex thoracic scoliosis. Otherwise normal. Vertebrae: Marked osseous demineralization. Chronic marked compression fracture of T12 vertebral body with retropulsion of a posterior fragment, significant AP narrowing of the spinal canal, and evidence of spinal augmentation procedure. Remaining vertebral body heights maintained. Few scattered endplate spurs and mild disc space narrowing at mid thoracic levels. No acute thoracic spine fracture or subluxation Paraspinal and other soft tissues: Extensive atherosclerotic calcification aorta with aneurysmal dilatation of the descending thoracic aorta to 3.2 x 3.5 cm image 68. Coronary arterial calcifications. Heart appears enlarged. No definite thoracic adenopathy. Dependent atelectasis in both lungs. Calcified granuloma  LEFT upper lobe. Disc levels: Grossly unremarkable. CT LUMBAR SPINE FINDINGS Segmentation: 5 non-rib-bearing lumbar type vertebra. Alignment: Normal Vertebrae: Marked osseous demineralization. Vertebral body heights maintained without acute fracture or subluxation. Multilevel facet degenerative changes. SI joints appear symmetric and preserved. No obvious sacral fracture. Paraspinal and other soft tissues: Extensive atherosclerotic calcifications abdominal aorta and iliac arteries. Partially calcified mass in the RIGHT pelvis, 6.9 x 5.3 x 8.2 cm, uncertain if arising from uterus or adnexa diet present since and grossly unchanged since an MRI of the pelvis from 2011. Disc levels: Diffusely narrowed.  No gross disc herniation. IMPRESSION: CT THORACIC SPINE IMPRESSION Marked osseous demineralization. Old T12 compression fracture and vertebroplasty with stable retropulsion of a dominant fragment and AP narrowing of the spinal canal. No acute thoracic spine abnormalities. CT LUMBAR SPINE IMPRESSION Osseous demineralization with degenerative disc and facet disease changes lumbar spine. No acute lumbar spine abnormalities. Partially calcified 6.9 x 5.3 x 8.2 cm diameter mass within the RIGHT pelvis, present since 2011, either of RIGHT ovarian or less likely uterine origin, favor benign in light of time this has been present and similar in appearance. Extensive aortic atherosclerosis with mild aneurysmal dilatation of the descending thoracic aorta up to 3.2 x 3.5 cm. Coronary arterial calcifications. Electronically Signed   By: Lavonia Dana M.D.   On: 03/17/2016 11:51   Ct Lumbar Spine Wo Contrast  Result Date: 03/17/2016 CLINICAL DATA:  Back pain, fell last night, dementia EXAM: CT THORACIC AND LUMBAR SPINE WITHOUT CONTRAST TECHNIQUE: Multidetector CT imaging of the thoracic and lumbar spine was performed without contrast. Multiplanar CT image reconstructions were also generated. COMPARISON:  Chest radiograph  01/21/2016, MR pelvis 01/04/2010 FINDINGS: CT THORACIC SPINE FINDINGS Alignment: 12 rib pairs. Mild  dextroconvex thoracic scoliosis. Otherwise normal. Vertebrae: Marked osseous demineralization. Chronic marked compression fracture of T12 vertebral body with retropulsion of a posterior fragment, significant AP narrowing of the spinal canal, and evidence of spinal augmentation procedure. Remaining vertebral body heights maintained. Few scattered endplate spurs and mild disc space narrowing at mid thoracic levels. No acute thoracic spine fracture or subluxation Paraspinal and other soft tissues: Extensive atherosclerotic calcification aorta with aneurysmal dilatation of the descending thoracic aorta to 3.2 x 3.5 cm image 68. Coronary arterial calcifications. Heart appears enlarged. No definite thoracic adenopathy. Dependent atelectasis in both lungs. Calcified granuloma LEFT upper lobe. Disc levels: Grossly unremarkable. CT LUMBAR SPINE FINDINGS Segmentation: 5 non-rib-bearing lumbar type vertebra. Alignment: Normal Vertebrae: Marked osseous demineralization. Vertebral body heights maintained without acute fracture or subluxation. Multilevel facet degenerative changes. SI joints appear symmetric and preserved. No obvious sacral fracture. Paraspinal and other soft tissues: Extensive atherosclerotic calcifications abdominal aorta and iliac arteries. Partially calcified mass in the RIGHT pelvis, 6.9 x 5.3 x 8.2 cm, uncertain if arising from uterus or adnexa diet present since and grossly unchanged since an MRI of the pelvis from 2011. Disc levels: Diffusely narrowed.  No gross disc herniation. IMPRESSION: CT THORACIC SPINE IMPRESSION Marked osseous demineralization. Old T12 compression fracture and vertebroplasty with stable retropulsion of a dominant fragment and AP narrowing of the spinal canal. No acute thoracic spine abnormalities. CT LUMBAR SPINE IMPRESSION Osseous demineralization with degenerative disc and facet  disease changes lumbar spine. No acute lumbar spine abnormalities. Partially calcified 6.9 x 5.3 x 8.2 cm diameter mass within the RIGHT pelvis, present since 2011, either of RIGHT ovarian or less likely uterine origin, favor benign in light of time this has been present and similar in appearance. Extensive aortic atherosclerosis with mild aneurysmal dilatation of the descending thoracic aorta up to 3.2 x 3.5 cm. Coronary arterial calcifications. Electronically Signed   By: Lavonia Dana M.D.   On: 03/17/2016 11:51   Ct Abdomen Pelvis W Contrast  Result Date: 03/17/2016 CLINICAL DATA:  Patient status post fall. Evaluate for traumatic injury. EXAM: CT CHEST, ABDOMEN, AND PELVIS WITH CONTRAST TECHNIQUE: Multidetector CT imaging of the chest, abdomen and pelvis was performed following the standard protocol during bolus administration of intravenous contrast. CONTRAST:  50mL ISOVUE-300 IOPAMIDOL (ISOVUE-300) INJECTION 61% COMPARISON:  MR pelvis 01/04/2010 FINDINGS: CT CHEST FINDINGS Cardiovascular: Heart is enlarged. No pericardial effusion. Peripheral atherosclerotic plaque involving the thoracic aorta. 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 The aorta measures up to 3.4 cm at this location (image 27; series 2). Mediastinum/Nodes: No enlarged axillary, mediastinal or hilar lymphadenopathy. Lungs/Pleura: Central airways are patent. Dependent atelectasis within the bilateral lower lobes. Calcified granuloma within the left upper lobe. 4 mm right middle lobe nodule (image 74; series 4). No pleural effusion or pneumothorax. Musculoskeletal: Mildly displaced fracture through the anterior left second and third ribs (image 47; series 4) (image 70; series 4). Nondisplaced posterior left ninth, twelfth and eleventh rib fractures. Chronic appearing T12 compression deformity with kyphoplasty material. Narrowing of the spinal canal at this level. Nondisplaced right tenth rib fracture. CT ABDOMEN  PELVIS FINDINGS Hepatobiliary: Liver is normal in size and contour. Gallbladder is unremarkable. No intrahepatic or extrahepatic biliary ductal dilatation. Pancreas: Unremarkable Spleen: Unremarkable Adrenals/Urinary Tract: The adrenal glands are normal. Kidneys enhance symmetrically with contrast. There is a 1.7 x 1.8 cm exophytic lesion off the interpolar region of the left kidney within internal density of 49 Hounsfield units (image 58; series 2).  Urinary bladder is dilated. Stomach/Bowel: Pancolonic diverticulosis. No CT evidence for acute diverticulitis. No free fluid or free intraperitoneal air. No evidence for bowel obstruction. Vascular/Lymphatic: Peripheral calcified atherosclerotic plaque involving the abdominal aorta. No retroperitoneal lymphadenopathy. Reproductive: There is a large partially calcified right adnexal mass and associated cyst. Cyst measures up to 4 cm stone in dimension. Sizes this mass appears similar to prior MRI 01/04/2010. Other: None. Musculoskeletal: Old left inferior pubic ramus fracture. Osseous demineralization. Lumbar spine degenerative changes. No aggressive or acute appearing osseous lesions. IMPRESSION: Left anterior second and third mildly displaced rib fractures. Nondisplaced posterior left ninth, eleventh and twelfth rib fractures. Nondisplaced right tenth rib fracture. Chronic appearing T12 compression deformity with kyphoplasty material. There is narrowing of the spinal canal at this level. Otherwise no acute process within the chest, abdomen or pelvis. Indeterminate dense exophytic mass off the interpolar region of the left kidney. This may represent a complicated cyst or solid renal mass. Recommend dedicated evaluation with pre and post contrast-enhanced MRI or CT in the non acute setting. 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 Given the patient's age, this is likely of doubtful clinical significance  however vascular consultation can be performed as clinically indicated. Indeterminate calcified and cystic mass within the right adnexa. Grossly this appears similar in size when compared to prior MRI exams. This is nonspecific in etiology however may be benign given stability over time. Pancolonic diverticulosis. Electronically Signed   By: Lovey Newcomer M.D.   On: 03/17/2016 11:34   Dg Knee Complete 4 Views Right  Result Date: 03/17/2016 CLINICAL DATA:  Witnessed fall last night.  Pain. EXAM: RIGHT KNEE - COMPLETE 4+ VIEW COMPARISON:  None. FINDINGS: Complete transverse fracture of the patella, displaced a few mm with slight offset. No fracture of the femur, tibia or fibula. Small lipoma hemarthrosis. IMPRESSION: Complete transverse fracture of the mid patella. Electronically Signed   By: Nelson Chimes M.D.   On: 03/17/2016 09:37   Dg Hip Unilat With Pelvis 2-3 Views Right  Result Date: 03/17/2016 CLINICAL DATA:  Witnessed fall last night. EXAM: DG HIP (WITH OR WITHOUT PELVIS) 2-3V RIGHT COMPARISON:  None. FINDINGS: No evidence of pelvic or hip fracture. Extensive pelvic calcifications presumed represent chronic benign uterine disease. Old healed rami fractures on the left. IMPRESSION: No acute finding. Electronically Signed   By: Nelson Chimes M.D.   On: 03/17/2016 09:35   Dg Femur, Min 2 Views Right  Result Date: 03/17/2016 CLINICAL DATA:  Witnessed fall last night.  Pain. EXAM: RIGHT FEMUR 2 VIEWS COMPARISON:  None. FINDINGS: There is no evidence of fracture or other focal bone lesions. Soft tissues are unremarkable. IMPRESSION: Negative. Electronically Signed   By: Nelson Chimes M.D.   On: 03/17/2016 09:36    Positive ROS: All other systems have been reviewed and were otherwise negative with the exception of those mentioned in the HPI and as above.  Physical Exam: General: Confused and not oriented. Cardiovascular: No pedal edema Respiratory: No cyanosis, no use of accessory musculature GI: No  organomegaly, abdomen is soft and non-tender Skin: No lesions in the area of chief complaint Neurologic: Sensation intact distally Psychiatric: Patient is not competent for consent  Lymphatic: No axillary or cervical lymphadenopathy  MUSCULOSKELETAL:  right lower extremity she has tenderness with withdrawal to palpation about the patella. She appears to be unable to hold a straight leg raise on the right side. Skin is warm and well-perfused distally with  good 2+ dorsalis pedis pulse.  Assessment: Right transverse patella fracture. Closed.  Plan: I discussed this injury at length with her daughter over the phone. Our concern was that without operative intervention she would not be able to weight-bear safely on that right leg and would certainly be at increased risk for fall if we attempt bracing. In my experience dementia patients are not very compliant with bracing at any rate. After long discussion of the risks benefits and indications of the procedure the daughter would like to proceed with operative intervention of the right patella fracture. We will plan for that surgery to take place on Monday afternoon. The daughter will provide consent for the surgery as a healthcare power of attorney and she will be in town some time today. -For now she may be weightbearing as tolerated with the knee immobilizer in place on the right leg.  Nicholes Stairs, MD Cell [phone]    03/18/2016 8:11 AM

## 2016-03-19 DIAGNOSIS — S82001A Unspecified fracture of right patella, initial encounter for closed fracture: Secondary | ICD-10-CM | POA: Diagnosis not present

## 2016-03-19 DIAGNOSIS — F0390 Unspecified dementia without behavioral disturbance: Secondary | ICD-10-CM

## 2016-03-19 DIAGNOSIS — K5900 Constipation, unspecified: Secondary | ICD-10-CM | POA: Diagnosis not present

## 2016-03-19 DIAGNOSIS — S2243XA Multiple fractures of ribs, bilateral, initial encounter for closed fracture: Secondary | ICD-10-CM | POA: Diagnosis not present

## 2016-03-19 DIAGNOSIS — S82092A Other fracture of left patella, initial encounter for closed fracture: Secondary | ICD-10-CM

## 2016-03-19 DIAGNOSIS — R531 Weakness: Secondary | ICD-10-CM | POA: Diagnosis not present

## 2016-03-19 DIAGNOSIS — I10 Essential (primary) hypertension: Secondary | ICD-10-CM | POA: Diagnosis not present

## 2016-03-19 DIAGNOSIS — S2242XD Multiple fractures of ribs, left side, subsequent encounter for fracture with routine healing: Secondary | ICD-10-CM | POA: Diagnosis not present

## 2016-03-19 MED ORDER — LORAZEPAM 0.5 MG PO TABS: 0.5000 mg | ORAL_TABLET | Freq: Three times a day (TID) | ORAL | 0 refills | Status: DC | PRN

## 2016-03-19 MED ORDER — ACETAMINOPHEN 325 MG PO TABS: 650.0000 mg | ORAL_TABLET | Freq: Four times a day (QID) | ORAL | Status: AC | PRN

## 2016-03-19 NOTE — Discharge Summary (Signed)
Physician Discharge Summary  [name] [id number] DOB: 10/06/1917 DOA: 03/17/2016  PCP: Jeanmarie Hubert, MD  Admit date: 03/17/2016 Discharge date: 03/19/2016  Time spent: 25* minutes  Recommendations for Outpatient Follow-up:  [facility] in [place] Follow up Dr Stann Mainland in one week   Discharge Diagnoses:  Principal Problem:   Fracture of multiple ribs Active Problems:   Essential hypertension   Hearing loss   Constipation   Hypothyroidism   Senile dementia   Closed patellar sleeve fracture of left knee   Fall   Discharge Condition: Stable  Diet recommendation: Dysphagia 3 diet  Filed Weights   03/17/16 0843  Weight: 54.8 kg (120 lb 12.8 oz)    History of present illness:  83 y.o.femalewith medical history significant of dementia and hypertension who resides in a skilled nursing facility and reportedly sustained a mechanical fall 1 day prior on 2/22. This was not witnessed. There was no reported head injury. Patient had x-rays done at the facility which then came back and noted a right patellar fracture. Patient was then sent up to the emergency room for further evaluation. ED Course:Today, in the emergency room, x-ray of the right knee noted a complete transverse fracture of the mid patella. Patient also underwent a full set of CT films for evaluation of her trauma. She is found to have a chronic calcified mass in her pelvis which was unchanged. She was noted to have an incidental left renal lesion. She was noted to have a saccular aneurysm or descending drastically aorta which was approximately 3.4 cm. Patient also noted to have a chronic T12 compression fracture. We'll concerning for multiple displaced fractures of the left second and third as well as posterior left ninth and 11th and 12th and right 10th. With the findings for her knee and multiple rib fractures, orthopedic and trauma surgery were consulted. With the surgery recommended a knee immobilizer, or unclear  given her advanced age and advanced dementia surgery would be appropriate. Trauma surgery had some recommendations, but felt that most she would need overnight observation. Given her advanced age, dementia and multiple surgical issues spanning multiple services, hospitalists were consulted for admission  Hospital Course:  1. Closed patella sleeve fracture of left knee-  surgery was initially planned, but family was hesitant to go for surgery at this age and would like  to wait for few weeks before deciding for surgery. I called and discussed with Dr. Stann Mainland, who agrees with holding off surgery at this time, and observe with conservative management. Patient is weight bearing as tolerated with knee brace.Continue tylenol, Ibuprofen prn. 2. Fracture of  multiple ribs- Seen by trauma  surgery, continue incentive spirometry every hour while awake.Surprisingly patient is not in severe pain, continue Tylenol when necessary for pain. 3. Left renal mass: Incidentally noted. Could better evaluate with a dedicated MRI, however this would not necessarily change anything as aggressive intervention such as a nephrectomy she did indeed have a renal cell carcinoma would not be done on this patient given her advanced age, dementia and DO NOT RESUSCITATE status 4. Hypothyroidism- stable, continue Synthroid 5. 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 Hypertension- blood pressure is stable,Continue metoprolol 12.5 mg twice a day 7. Constipation- Continue MiraLAX When necessary  Procedures:  None   Consultations:  Orthopedics  Discharge Exam: Vitals:   03/18/16 2218 03/19/16 1357  BP: (!) 148/54 (!) 144/58  Pulse: 61 66  Resp:  15 14  Temp: 98.1 F (36.7 C) 98.3 F (36.8 C)    General: Appears in no acute distress Cardiovascular: RRR, S1S2 Respiratory: Clear bilaterally Ext - Right lower extremity in brace  Discharge  Instructions   Discharge Instructions    Discharge instructions    Complete by:  As directed    [facility] in [facility]     Current Discharge Medication List    START taking these medications   Details  acetaminophen (TYLENOL) 325 MG tablet Take 2 tablets (650 mg total) by mouth every 6 (six) hours as needed for mild pain (or Fever >/= 101).      CONTINUE these medications which have CHANGED   Details  LORazepam (ATIVAN) 0.5 MG tablet Take 1 tablet (0.5 mg total) by mouth every 8 (eight) hours as needed for anxiety. Qty: 5 tablet, Refills: 0      CONTINUE these medications which have NOT CHANGED   Details  aspirin EC 81 MG tablet Take 1 tablet (81 mg total) by mouth daily.    Calcium Carbonate-Vitamin D (CALCIUM 600+D) 600-200 MG-UNIT TABS Take 1 tablet by mouth daily.     citalopram (CELEXA) 10 MG tablet One daily to help depression and anxiety Qty: 30 tablet, Refills: 5   Associated Diagnoses: Severe episode of recurrent major depressive disorder, with psychotic features (HCC)    Cobalamine Combinations (VITAMIN B12-FOLIC ACID) XX123456 MCG TABS Take 1 tablet by mouth daily with breakfast.    hydrocortisone (PROCTO-MED HC) 2.5 % rectal cream Place 1 application rectally every 12 (twelve) hours as needed for hemorrhoids or itching.    ibuprofen (ADVIL,MOTRIN) 200 MG tablet Take 400 mg by mouth every 6 (six) hours as needed (for pain).    levothyroxine (SYNTHROID, LEVOTHROID) 25 MCG tablet Take 25 mcg by mouth daily before breakfast.    metoprolol tartrate (LOPRESSOR) 25 MG tablet Take 12.5 mg by mouth 2 (two) times daily.    mineral oil-hydrophilic petrolatum (AQUAPHOR) ointment Apply 1 application topically daily as needed for dry skin.    polyethylene glycol (MIRALAX / GLYCOLAX) packet Take 17 g by mouth daily. Qty: 14 each, Refills: 0    risperiDONE (RISPERDAL) 0.5 MG tablet One at bedtime to prevent hallucinations and anxiety Qty: 30 tablet, Refills: 4    Associated Diagnoses: Hallucinations    vitamin B-12 (CYANOCOBALAMIN) 1000 MCG tablet Take 1,000 mcg by mouth daily.       Allergies  Allergen Reactions  . Ace Inhibitors Other (See Comments)    Reaction unknown/ listed on MAR  . Hydrochlorothiazide Other (See Comments)    Reaction unknown/ listed on MAR  . Morphine And Related Other (See Comments)    Hallucinations   Follow-up Information    Nicholes Stairs, MD. Schedule an appointment as soon as possible for a visit in 1 week(s).   Specialty:  Orthopedic Surgery Contact information: [address] [place] [address] [place] [address] [id number]            The results of significant diagnostics from this hospitalization (including imaging, microbiology, ancillary and laboratory) are listed below for reference.    Significant Diagnostic Studies: Dg Chest 2 View  Result Date: 03/17/2016 CLINICAL DATA:  Witnessed fall last night. EXAM: CHEST  2 VIEW COMPARISON:  01/21/2016 FINDINGS: Frontal view degraded by motion. There is left ventricular prominence. There is chronic aortic atherosclerosis. Right lung is clear. There is abnormal density in the left lower lobe that could be atelectasis and/or pneumonia. Compared to the  previous study, there is slight further loss of height of a lower thoracic vertebral body. Old augmented compression fracture down lower appears unchanged. IMPRESSION: Left lower lobe atelectasis and/or pneumonia. Slight compression fracture in the mid to lower thoracic spine. Electronically Signed   By: Nelson Chimes M.D.   On: 03/17/2016 09:34   Ct Head Wo Contrast  Result Date: 03/17/2016 CLINICAL DATA:  Pain following fall.  Underlying dementia EXAM: CT HEAD WITHOUT CONTRAST CT CERVICAL SPINE WITHOUT CONTRAST TECHNIQUE: Multidetector CT imaging of the head and cervical spine was performed following the standard protocol without intravenous contrast. Multiplanar CT image reconstructions of the cervical spine  were also generated. COMPARISON:  CT head January 21, 2016; CT cervical spine November 10, 2015 FINDINGS: CT HEAD FINDINGS Brain: Moderate diffuse atrophy is stable. There is no evident intracranial mass, hemorrhage, extra-axial fluid collection, or midline shift. There is small vessel disease throughout the centra semiovale bilaterally, stable. No new gray-white compartment lesion is evident. No acute infarct appreciable. Calcification along the tentorium bilaterally is stable. Vascular: No hyper dense vessels. There is extensive carotid siphon calcification bilaterally as well as calcification in both distal vertebral arteries. Skull: The bony calvarium is intact. Sinuses/Orbits: There is opacification in a posterior right ethmoid air cell. There is slight mucosal thickening in several ethmoid air cells elsewhere. Other visualized paranasal sinuses are clear. Visualized orbits appear symmetric bilaterally. Note that the patient has had cataract removal on the right, stable. Other: Mastoid air cells are clear CT CERVICAL SPINE FINDINGS Alignment: There is cervical dextroscoliosis. There is just over 3 mm of anterolisthesis of C3 on C4, slightly increased from prior study. There is just under 2 mm of anterolisthesis of C4 on C5, stable. No spondylolisthesis is evident elsewhere. Skull base and vertebrae: Skull base and craniocervical junction regions appear normal. There is moderate pannus posterior to the odontoid which is not causing appreciable impression on the craniocervical junction. There is no evident fracture. There are no blastic or lytic bone lesions. Soft tissues and spinal canal: The prevertebral soft tissues and predental space regions are normal. No paraspinous lesions are evident. There is no cord or canal hematoma evident. No spinal stenosis appreciable. Disc levels: There is marked disc space narrowing at C4-5. There is moderately severe disc space narrowing at C5-6 and C6-7. There is moderate disc  space narrowing at C3-4 and C7-T1. There is facet hypertrophy at most levels bilaterally. There is no frank disc extrusion. Upper chest: There is aortic atherosclerosis. There is mild scarring in the lung apices superiorly. Other: There is calcification in both carotid arteries. There are multiple small nodular lesions in the thyroid without dominant thyroid mass evident. IMPRESSION: CT head: Stable atrophy with extensive supratentorial small vessel disease. No acute infarct evident. No mass, hemorrhage, or extra-axial fluid collection. Multiple foci of arteriovascular calcification present. There is ethmoid sinus disease, most notably in a posterior right ethmoid air cell, where there is opacification of much of this air cell. CT cervical spine: No fracture. Spondylolisthesis at C3-4 and C4-5. Note that there is slightly more anterolisthesis at C3-4 than on prior study. Spondylolisthesis at C4-5 stable. The spondylolisthesis at these levels is felt to be due to spondylosis underlying. There is multifocal arthropathy. No frank disc extrusion or high-grade stenosis. Aortic atherosclerosis. Bilateral carotid artery calcification. Multinodular goiter without dominant thyroid mass. Electronically Signed   By: Lowella Grip III M.D.   On: 03/17/2016 09:44   Ct Chest W Contrast  Result Date: 03/17/2016 CLINICAL  DATA:  Patient status post fall. Evaluate for traumatic injury. EXAM: CT CHEST, ABDOMEN, AND PELVIS WITH CONTRAST TECHNIQUE: Multidetector CT imaging of the chest, abdomen and pelvis was performed following the standard protocol during bolus administration of intravenous contrast. CONTRAST:  33mL ISOVUE-300 IOPAMIDOL (ISOVUE-300) INJECTION 61% COMPARISON:  MR pelvis 01/04/2010 FINDINGS: CT CHEST FINDINGS Cardiovascular: Heart is enlarged. No pericardial effusion. Peripheral atherosclerotic plaque involving the thoracic aorta. 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 The aorta  measures up to 3.4 cm at this location (image 27; series 2). Mediastinum/Nodes: No enlarged axillary, mediastinal or hilar lymphadenopathy. Lungs/Pleura: Central airways are patent. Dependent atelectasis within the bilateral lower lobes. Calcified granuloma within the left upper lobe. 4 mm right middle lobe nodule (image 74; series 4). No pleural effusion or pneumothorax. Musculoskeletal: Mildly displaced fracture through the anterior left second and third ribs (image 47; series 4) (image 70; series 4). Nondisplaced posterior left ninth, twelfth and eleventh rib fractures. Chronic appearing T12 compression deformity with kyphoplasty material. Narrowing of the spinal canal at this level. Nondisplaced right tenth rib fracture. CT ABDOMEN PELVIS FINDINGS Hepatobiliary: Liver is normal in size and contour. Gallbladder is unremarkable. No intrahepatic or extrahepatic biliary ductal dilatation. Pancreas: Unremarkable Spleen: Unremarkable Adrenals/Urinary Tract: The adrenal glands are normal. Kidneys enhance symmetrically with contrast. There is a 1.7 x 1.8 cm exophytic lesion off the interpolar region of the left kidney within internal density of 49 Hounsfield units (image 58; series 2). Urinary bladder is dilated. Stomach/Bowel: Pancolonic diverticulosis. No CT evidence for acute diverticulitis. No free fluid or free intraperitoneal air. No evidence for bowel obstruction. Vascular/Lymphatic: Peripheral calcified atherosclerotic plaque involving the abdominal aorta. No retroperitoneal lymphadenopathy. Reproductive: There is a large partially calcified right adnexal mass and associated cyst. Cyst measures up to 4 cm stone in dimension. Sizes this mass appears similar to prior MRI 01/04/2010. Other: None. Musculoskeletal: Old left inferior pubic ramus fracture. Osseous demineralization. Lumbar spine degenerative changes. No aggressive or acute appearing osseous lesions. IMPRESSION: Left anterior second and third mildly  displaced rib fractures. Nondisplaced posterior left ninth, eleventh and twelfth rib fractures. Nondisplaced right tenth rib fracture. Chronic appearing T12 compression deformity with kyphoplasty material. There is narrowing of the spinal canal at this level. Otherwise no acute process within the chest, abdomen or pelvis. Indeterminate dense exophytic mass off the interpolar region of the left kidney. This may represent a complicated cyst or solid renal mass. Recommend dedicated evaluation with pre and post contrast-enhanced MRI or CT in the non acute setting. 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 Given the patient's age, this is likely of doubtful clinical significance however vascular consultation can be performed as clinically indicated. Indeterminate calcified and cystic mass within the right adnexa. Grossly this appears similar in size when compared to prior MRI exams. This is nonspecific in etiology however may be benign given stability over time. Pancolonic diverticulosis. Electronically Signed   By: Lovey Newcomer M.D.   On: 03/17/2016 11:34   Ct Cervical Spine Wo Contrast  Result Date: 03/17/2016 CLINICAL DATA:  Pain following fall.  Underlying dementia EXAM: CT HEAD WITHOUT CONTRAST CT CERVICAL SPINE WITHOUT CONTRAST TECHNIQUE: Multidetector CT imaging of the head and cervical spine was performed following the standard protocol without intravenous contrast. Multiplanar CT image reconstructions of the cervical spine were also generated. COMPARISON:  CT head January 21, 2016; CT cervical spine November 10, 2015 FINDINGS: CT HEAD FINDINGS Brain: Moderate diffuse  atrophy is stable. There is no evident intracranial mass, hemorrhage, extra-axial fluid collection, or midline shift. There is small vessel disease throughout the centra semiovale bilaterally, stable. No new gray-white compartment lesion is evident. No acute infarct appreciable.  Calcification along the tentorium bilaterally is stable. Vascular: No hyper dense vessels. There is extensive carotid siphon calcification bilaterally as well as calcification in both distal vertebral arteries. Skull: The bony calvarium is intact. Sinuses/Orbits: There is opacification in a posterior right ethmoid air cell. There is slight mucosal thickening in several ethmoid air cells elsewhere. Other visualized paranasal sinuses are clear. Visualized orbits appear symmetric bilaterally. Note that the patient has had cataract removal on the right, stable. Other: Mastoid air cells are clear CT CERVICAL SPINE FINDINGS Alignment: There is cervical dextroscoliosis. There is just over 3 mm of anterolisthesis of C3 on C4, slightly increased from prior study. There is just under 2 mm of anterolisthesis of C4 on C5, stable. No spondylolisthesis is evident elsewhere. Skull base and vertebrae: Skull base and craniocervical junction regions appear normal. There is moderate pannus posterior to the odontoid which is not causing appreciable impression on the craniocervical junction. There is no evident fracture. There are no blastic or lytic bone lesions. Soft tissues and spinal canal: The prevertebral soft tissues and predental space regions are normal. No paraspinous lesions are evident. There is no cord or canal hematoma evident. No spinal stenosis appreciable. Disc levels: There is marked disc space narrowing at C4-5. There is moderately severe disc space narrowing at C5-6 and C6-7. There is moderate disc space narrowing at C3-4 and C7-T1. There is facet hypertrophy at most levels bilaterally. There is no frank disc extrusion. Upper chest: There is aortic atherosclerosis. There is mild scarring in the lung apices superiorly. Other: There is calcification in both carotid arteries. There are multiple small nodular lesions in the thyroid without dominant thyroid mass evident. IMPRESSION: CT head: Stable atrophy with extensive  supratentorial small vessel disease. No acute infarct evident. No mass, hemorrhage, or extra-axial fluid collection. Multiple foci of arteriovascular calcification present. There is ethmoid sinus disease, most notably in a posterior right ethmoid air cell, where there is opacification of much of this air cell. CT cervical spine: No fracture. Spondylolisthesis at C3-4 and C4-5. Note that there is slightly more anterolisthesis at C3-4 than on prior study. Spondylolisthesis at C4-5 stable. The spondylolisthesis at these levels is felt to be due to spondylosis underlying. There is multifocal arthropathy. No frank disc extrusion or high-grade stenosis. Aortic atherosclerosis. Bilateral carotid artery calcification. Multinodular goiter without dominant thyroid mass. Electronically Signed   By: Lowella Grip III M.D.   On: 03/17/2016 09:44   Ct Thoracic Spine Wo Contrast  Result Date: 03/17/2016 CLINICAL DATA:  Back pain, fell last night, dementia EXAM: CT THORACIC AND LUMBAR SPINE WITHOUT CONTRAST TECHNIQUE: Multidetector CT imaging of the thoracic and lumbar spine was performed without contrast. Multiplanar CT image reconstructions were also generated. COMPARISON:  Chest radiograph 01/21/2016, MR pelvis 01/04/2010 FINDINGS: CT THORACIC SPINE FINDINGS Alignment: 12 rib pairs. Mild dextroconvex thoracic scoliosis. Otherwise normal. Vertebrae: Marked osseous demineralization. Chronic marked compression fracture of T12 vertebral body with retropulsion of a posterior fragment, significant AP narrowing of the spinal canal, and evidence of spinal augmentation procedure. Remaining vertebral body heights maintained. Few scattered endplate spurs and mild disc space narrowing at mid thoracic levels. No acute thoracic spine fracture or subluxation Paraspinal and other soft tissues: Extensive atherosclerotic calcification aorta with aneurysmal dilatation of the descending thoracic  aorta to 3.2 x 3.5 cm image 68. Coronary  arterial calcifications. Heart appears enlarged. No definite thoracic adenopathy. Dependent atelectasis in both lungs. Calcified granuloma LEFT upper lobe. Disc levels: Grossly unremarkable. CT LUMBAR SPINE FINDINGS Segmentation: 5 non-rib-bearing lumbar type vertebra. Alignment: Normal Vertebrae: Marked osseous demineralization. Vertebral body heights maintained without acute fracture or subluxation. Multilevel facet degenerative changes. SI joints appear symmetric and preserved. No obvious sacral fracture. Paraspinal and other soft tissues: Extensive atherosclerotic calcifications abdominal aorta and iliac arteries. Partially calcified mass in the RIGHT pelvis, 6.9 x 5.3 x 8.2 cm, uncertain if arising from uterus or adnexa diet present since and grossly unchanged since an MRI of the pelvis from 2011. Disc levels: Diffusely narrowed.  No gross disc herniation. IMPRESSION: CT THORACIC SPINE IMPRESSION Marked osseous demineralization. Old T12 compression fracture and vertebroplasty with stable retropulsion of a dominant fragment and AP narrowing of the spinal canal. No acute thoracic spine abnormalities. CT LUMBAR SPINE IMPRESSION Osseous demineralization with degenerative disc and facet disease changes lumbar spine. No acute lumbar spine abnormalities. Partially calcified 6.9 x 5.3 x 8.2 cm diameter mass within the RIGHT pelvis, present since 2011, either of RIGHT ovarian or less likely uterine origin, favor benign in light of time this has been present and similar in appearance. Extensive aortic atherosclerosis with mild aneurysmal dilatation of the descending thoracic aorta up to 3.2 x 3.5 cm. Coronary arterial calcifications. Electronically Signed   By: Lavonia Dana M.D.   On: 03/17/2016 11:51   Ct Lumbar Spine Wo Contrast  Result Date: 03/17/2016 CLINICAL DATA:  Back pain, fell last night, dementia EXAM: CT THORACIC AND LUMBAR SPINE WITHOUT CONTRAST TECHNIQUE: Multidetector CT imaging of the thoracic and  lumbar spine was performed without contrast. Multiplanar CT image reconstructions were also generated. COMPARISON:  Chest radiograph 01/21/2016, MR pelvis 01/04/2010 FINDINGS: CT THORACIC SPINE FINDINGS Alignment: 12 rib pairs. Mild dextroconvex thoracic scoliosis. Otherwise normal. Vertebrae: Marked osseous demineralization. Chronic marked compression fracture of T12 vertebral body with retropulsion of a posterior fragment, significant AP narrowing of the spinal canal, and evidence of spinal augmentation procedure. Remaining vertebral body heights maintained. Few scattered endplate spurs and mild disc space narrowing at mid thoracic levels. No acute thoracic spine fracture or subluxation Paraspinal and other soft tissues: Extensive atherosclerotic calcification aorta with aneurysmal dilatation of the descending thoracic aorta to 3.2 x 3.5 cm image 68. Coronary arterial calcifications. Heart appears enlarged. No definite thoracic adenopathy. Dependent atelectasis in both lungs. Calcified granuloma LEFT upper lobe. Disc levels: Grossly unremarkable. CT LUMBAR SPINE FINDINGS Segmentation: 5 non-rib-bearing lumbar type vertebra. Alignment: Normal Vertebrae: Marked osseous demineralization. Vertebral body heights maintained without acute fracture or subluxation. Multilevel facet degenerative changes. SI joints appear symmetric and preserved. No obvious sacral fracture. Paraspinal and other soft tissues: Extensive atherosclerotic calcifications abdominal aorta and iliac arteries. Partially calcified mass in the RIGHT pelvis, 6.9 x 5.3 x 8.2 cm, uncertain if arising from uterus or adnexa diet present since and grossly unchanged since an MRI of the pelvis from 2011. Disc levels: Diffusely narrowed.  No gross disc herniation. IMPRESSION: CT THORACIC SPINE IMPRESSION Marked osseous demineralization. Old T12 compression fracture and vertebroplasty with stable retropulsion of a dominant fragment and AP narrowing of the spinal  canal. No acute thoracic spine abnormalities. CT LUMBAR SPINE IMPRESSION Osseous demineralization with degenerative disc and facet disease changes lumbar spine. No acute lumbar spine abnormalities. Partially calcified 6.9 x 5.3 x 8.2 cm diameter mass within the RIGHT pelvis, present since 2011, either  of RIGHT ovarian or less likely uterine origin, favor benign in light of time this has been present and similar in appearance. Extensive aortic atherosclerosis with mild aneurysmal dilatation of the descending thoracic aorta up to 3.2 x 3.5 cm. Coronary arterial calcifications. Electronically Signed   By: Lavonia Dana M.D.   On: 03/17/2016 11:51   Ct Abdomen Pelvis W Contrast  Result Date: 03/17/2016 CLINICAL DATA:  Patient status post fall. Evaluate for traumatic injury. EXAM: CT CHEST, ABDOMEN, AND PELVIS WITH CONTRAST TECHNIQUE: Multidetector CT imaging of the chest, abdomen and pelvis was performed following the standard protocol during bolus administration of intravenous contrast. CONTRAST:  72mL ISOVUE-300 IOPAMIDOL (ISOVUE-300) INJECTION 61% COMPARISON:  MR pelvis 01/04/2010 FINDINGS: CT CHEST FINDINGS Cardiovascular: Heart is enlarged. No pericardial effusion. Peripheral atherosclerotic plaque involving the thoracic aorta. 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 The aorta measures up to 3.4 cm at this location (image 27; series 2). Mediastinum/Nodes: No enlarged axillary, mediastinal or hilar lymphadenopathy. Lungs/Pleura: Central airways are patent. Dependent atelectasis within the bilateral lower lobes. Calcified granuloma within the left upper lobe. 4 mm right middle lobe nodule (image 74; series 4). No pleural effusion or pneumothorax. Musculoskeletal: Mildly displaced fracture through the anterior left second and third ribs (image 47; series 4) (image 70; series 4). Nondisplaced posterior left ninth, twelfth and eleventh rib fractures. Chronic appearing T12 compression  deformity with kyphoplasty material. Narrowing of the spinal canal at this level. Nondisplaced right tenth rib fracture. CT ABDOMEN PELVIS FINDINGS Hepatobiliary: Liver is normal in size and contour. Gallbladder is unremarkable. No intrahepatic or extrahepatic biliary ductal dilatation. Pancreas: Unremarkable Spleen: Unremarkable Adrenals/Urinary Tract: The adrenal glands are normal. Kidneys enhance symmetrically with contrast. There is a 1.7 x 1.8 cm exophytic lesion off the interpolar region of the left kidney within internal density of 49 Hounsfield units (image 58; series 2). Urinary bladder is dilated. Stomach/Bowel: Pancolonic diverticulosis. No CT evidence for acute diverticulitis. No free fluid or free intraperitoneal air. No evidence for bowel obstruction. Vascular/Lymphatic: Peripheral calcified atherosclerotic plaque involving the abdominal aorta. No retroperitoneal lymphadenopathy. Reproductive: There is a large partially calcified right adnexal mass and associated cyst. Cyst measures up to 4 cm stone in dimension. Sizes this mass appears similar to prior MRI 01/04/2010. Other: None. Musculoskeletal: Old left inferior pubic ramus fracture. Osseous demineralization. Lumbar spine degenerative changes. No aggressive or acute appearing osseous lesions. IMPRESSION: Left anterior second and third mildly displaced rib fractures. Nondisplaced posterior left ninth, eleventh and twelfth rib fractures. Nondisplaced right tenth rib fracture. Chronic appearing T12 compression deformity with kyphoplasty material. There is narrowing of the spinal canal at this level. Otherwise no acute process within the chest, abdomen or pelvis. Indeterminate dense exophytic mass off the interpolar region of the left kidney. This may represent a complicated cyst or solid renal mass. Recommend dedicated evaluation with pre and post contrast-enhanced MRI or CT in the non acute setting. Extensive atherosclerotic plaque involving the  descending thoracic aorta with note of a saccular aneurysm. Given the patient's age, this is likely of doubtful clinical significance however vascular consultation can be performed as clinically indicated. Indeterminate calcified and cystic mass within the right adnexa. Grossly this appears similar in size when compared to prior MRI exams. This is nonspecific in etiology however may be benign given stability over time. Pancolonic diverticulosis. Electronically Signed   By: Lovey Newcomer M.D.   On: 03/17/2016 11:34   Dg Knee Complete 4 Views Right  Result Date:  03/17/2016 CLINICAL DATA:  Witnessed fall last night.  Pain. EXAM: RIGHT KNEE - COMPLETE 4+ VIEW COMPARISON:  None. FINDINGS: Complete transverse fracture of the patella, displaced a few mm with slight offset. No fracture of the femur, tibia or fibula. Small lipoma hemarthrosis. IMPRESSION: Complete transverse fracture of the mid patella. Electronically Signed   By: Nelson Chimes M.D.   On: 03/17/2016 09:37   Dg Hip Unilat With Pelvis 2-3 Views Right  Result Date: 03/17/2016 CLINICAL DATA:  Witnessed fall last night. EXAM: DG HIP (WITH OR WITHOUT PELVIS) 2-3V RIGHT COMPARISON:  None. FINDINGS: No evidence of pelvic or hip fracture. Extensive pelvic calcifications presumed represent chronic benign uterine disease. Old healed rami fractures on the left. IMPRESSION: No acute finding. Electronically Signed   By: Nelson Chimes M.D.   On: 03/17/2016 09:35   Dg Femur, Min 2 Views Right  Result Date: 03/17/2016 CLINICAL DATA:  Witnessed fall last night.  Pain. EXAM: RIGHT FEMUR 2 VIEWS COMPARISON:  None. FINDINGS: There is no evidence of fracture or other focal bone lesions. Soft tissues are unremarkable. IMPRESSION: Negative. Electronically Signed   By: Nelson Chimes M.D.   On: 03/17/2016 09:36    Microbiology: Recent Results (from the past 240 hour(s))  MRSA PCR Screening     Status: None   Collection Time: 03/18/16  2:00 AM  Result Value Ref Range  Status   MRSA by PCR NEGATIVE NEGATIVE Final    Comment:        The GeneXpert MRSA Assay (FDA approved for NASAL specimens only), is one component of a comprehensive MRSA colonization surveillance program. It is not intended to diagnose MRSA infection nor to guide or monitor treatment for MRSA infections.      Labs: Basic Metabolic Panel:  Recent Labs Lab 03/17/16 0907 03/18/16 0847  NA 139 137  K 3.3* 4.0  CL 102 102  CO2 29 26  GLUCOSE 97 110*  BUN 17 24*  CREATININE 0.62 1.06*  CALCIUM 9.1 9.0   Liver Function Tests:  Recent Labs Lab 03/17/16 0907  AST 18  ALT 13*  ALKPHOS 59  BILITOT 1.1  PROT 6.9  ALBUMIN 4.2   No results for input(s): LIPASE, AMYLASE in the last 168 hours. No results for input(s): AMMONIA in the last 168 hours. CBC:  Recent Labs Lab 03/17/16 0907 03/18/16 0431  WBC 10.0 11.5*  NEUTROABS 6.8  --   HGB 13.8 13.1  HCT 40.5 38.5  MCV 96.4 96.3  PLT 337 355   Cardiac Enzymes: No results for input(s): CKTOTAL, CKMB, CKMBINDEX, TROPONINI in the last 168 hours. BNP: BNP (last 3 results) No results for input(s): BNP in the last 8760 hours.  ProBNP (last 3 results) No results for input(s): PROBNP in the last 8760 hours.  CBG: No results for input(s): GLUCAP in the last 168 hours.     SignedOswald Hillock MD.  [facility] 03/19/2016, 3:08 PM

## 2016-03-19 NOTE — Discharge Instructions (Addendum)
Wylene Simmer, MD [place]  Please read the following information regarding your care after surgery.  Medications  You only need a prescription for the narcotic pain medicine (ex. oxycodone, Percocet, Norco).  All of the other medicines listed below are available over the counter. X acetominophen (Tylenol) 650 mg every 4-6 hours as you need for minor pain X may supplement with aleve 220 mg - 2 tablets twice daily with food - as needed for moderate pain   Weight Bearing X Bear weight when you are able in knee immobilzer on your injured leg or foot. X Up with assistance.  After your dressing, cast or splint is removed; you may shower, but do not soak or scrub the wound.  Allow the water to run over it, and then gently pat it dry.  Swelling It is normal for you to have swelling where you had surgery.  To reduce swelling and pain, keep your toes above your nose for at least 3 days after surgery.  It may be necessary to keep your foot or leg elevated for several weeks.  If it hurts, it should be elevated.  Follow Up Call my office at [phone] when you are discharged from the hospital or surgery center to schedule an appointment to be seen two weeks after surgery.  Call my office at [phone] if you develop a fever >101.5 F, nausea, vomiting, bleeding from the surgical site or severe pain.

## 2016-03-19 NOTE — Care Management Obs Status (Signed)
[place] NOTIFICATION   Patient Details  Name: KATHALIA GILKES MRN: [id number] Date of Birth: April 04, 1917   Medicare Observation Status Notification Given:  Yes    Erenest Rasher, RN 03/19/2016, 3:48 PM

## 2016-03-19 NOTE — Care Management Note (Signed)
Case Management Note  Patient Details  Name: [name] MRN: [id number] Date of Birth: Aug 31, 1917  Subjective/Objective:  Closed patellar sleeve fracture of left knee                 Action/Plan: Discharge Planning: AVS reviewed: NCM spoke to pt dtr, Hunt Oris [phone] at bedside. States pt is at [facility]. Pt will transitioned to there Healthcare Unit and eventually moved to SNF. CSW following for placement.    PCP GREEN, Viviann Spare   Expected Discharge Date:  03/19/16               Expected Discharge Plan:  Skilled Nursing Facility  In-House Referral:  Clinical Social Work  Discharge planning Services  CM Consult  Post Acute Care Choice:  NA Choice offered to:  NA  DME Arranged:  N/A DME Agency:  NA  HH Arranged:  NA HH Agency:  NA  Status of Service:  Completed, signed off  If discussed at [employer] of Stay Meetings, dates discussed:    Additional Comments:  Erenest Rasher, RN 03/19/2016, 3:51 PM

## 2016-03-19 NOTE — Progress Notes (Signed)
Discharged from floor via stretcher for transport to [facility] by ambulance. EMT, belongings & family with pt. No changes in assessment. Jourden Gilson, [employer]

## 2016-03-19 NOTE — NC FL2 (Signed)
[place] MEDICAID FL2 LEVEL OF CARE SCREENING TOOL     IDENTIFICATION  Patient Name: [name] Birthdate: 07/14/1917 Sex: female Admission Date (Current Location): 03/17/2016  [facility] and [place] Number:  Herbalist and Address:  [facility],  [place] [address], [place]      Provider Number: [id number]  Attending Physician Name and Address:  Oswald Hillock, MD  Relative Name and Phone Number:       Current Level of Care: Hospital Recommended Level of Care: [place] Prior Approval Number:    Date Approved/Denied:   PASRR Number:    Discharge Plan: SNF    Current Diagnoses: Patient Active Problem List   Diagnosis Date Noted  . Patella fracture 03/17/2016  . Closed patellar sleeve fracture of left knee 03/17/2016  . Fall 03/17/2016  . Fracture of multiple ribs 03/17/2016  . Vitamin B 12 deficiency 02/15/2016  . Syncope 01/21/2016  . Cellulitis 01/21/2016  . Senile dementia 12/31/2015  . Hypothyroidism 12/14/2015  . Injury of left shin 12/10/2015  . Rash 09/21/2015  . Urinary tract infection 09/14/2015  . Constipation 06/15/2015  . Urinary frequency 06/15/2015  . Depression, major 06/15/2015  . Vaginal prolapse 06/15/2015  . Edema 04/21/2015  . Hallucinations 12/22/2014  . Speech abnormality   . TIA (transient ischemic attack) 12/10/2014  . Hypokalemia 12/10/2014  . Generalized anxiety disorder 06/23/2014  . Hearing loss 06/03/2014  . Insomnia 12/16/2013  . Trigger finger, acquired 03/04/2013  . Pain in hand   . Essential hypertension 03/15/2010  . Urinary incontinence 03/15/2010  . Abnormality of gait 03/15/2010    Orientation RESPIRATION BLADDER Height & Weight     Self  Normal Incontinent Weight: 120 lb 12.8 oz (54.8 kg) Height:  5\' 2"  (157.5 cm)  BEHAVIORAL SYMPTOMS/MOOD NEUROLOGICAL BOWEL NUTRITION STATUS      Incontinent Diet  AMBULATORY STATUS COMMUNICATION OF NEEDS Skin   Extensive Assist  Verbally  (Skin tear- R leg, STeristrips to area)                       Personal Care Assistance Level of Assistance  Total care       Total Care Assistance: Maximum assistance   Functional Limitations Info             SPECIAL CARE FACTORS FREQUENCY   (WBAT L leg- brace)                    Contractures Contractures Info:  (Non known)    Additional Factors Info  Code Status, Allergies Code Status Info: DNR Allergies Info: Ace inhibitor, hydrochlorothiazide, Morphine and related           Current Medications (03/19/2016):  This is the current hospital active medication list Current Facility-Administered Medications  Medication Dose Route Frequency Provider Last Rate Last Dose  . acetaminophen (TYLENOL) tablet 650 mg  650 mg Oral Q6H PRN Annita Brod, MD   325 mg at 03/19/16 1155   Or  . acetaminophen (TYLENOL) suppository 650 mg  650 mg Rectal Q6H PRN Annita Brod, MD      . citalopram (CELEXA) tablet 10 mg  10 mg Oral Daily Annita Brod, MD   10 mg at 03/19/16 1011  . levothyroxine (SYNTHROID, LEVOTHROID) tablet 25 mcg  25 mcg Oral QAC breakfast Annita Brod, MD   25 mcg at 03/19/16 0805  . LORazepam (ATIVAN) tablet 0.5 mg  0.5 mg Oral Q8H PRN Annita Brod, MD   0.5 mg at 03/18/16 0554  . metoprolol tartrate (LOPRESSOR) tablet 12.5 mg  12.5 mg Oral BID Annita Brod, MD   12.5 mg at 03/19/16 1011  . ondansetron (ZOFRAN) tablet 4 mg  4 mg Oral Q6H PRN Annita Brod, MD       Or  . ondansetron (ZOFRAN) injection 4 mg  4 mg Intravenous Q6H PRN Annita Brod, MD      . polyethylene glycol (MIRALAX / GLYCOLAX) packet 17 g  17 g Oral Daily Annita Brod, MD   17 g at 03/19/16 1011  . risperiDONE (RISPERDAL) tablet 0.5 mg  0.5 mg Oral QHS Annita Brod, MD   0.5 mg at 03/18/16 2250     Discharge Medications: Please see discharge summary for a list of discharge medications.  Relevant Imaging Results:  Relevant Lab  Results:   Additional Information SSN [id number]  Williemae Area, LCSW

## 2016-03-19 NOTE — Progress Notes (Signed)
Subjective:   Procedure(s) (LRB): OPEN REDUCTION INTERNAL (ORIF) FIXATION PATELLA (Right)  According to nurse patient is more talkative this morning.  Unable to hold coherent conversation.  No complaints of pain according to nurse.  Resting comfortably in bed.  Family at bedside.  Objective:   VITALS:  Temp:  [98.1 F (36.7 C)-98.2 F (36.8 C)] 98.1 F (36.7 C) (02/24 2218) Pulse Rate:  [61-62] 61 (02/24 2218) Resp:  [15-18] 15 (02/24 2218) BP: (146-148)/(54-62) 148/54 (02/24 2218) SpO2:  [96 %] 96 % (02/24 2218)  General: WDWN patient in NAD. Psych:  Appropriate mood and affect. Neuro:  A&O x 3, Moving all extremities, sensation intact to light touch HEENT:  EOMs intact Chest:  Even non-labored respirations Skin:  Skin C/D/I, no rashes or lesions.  Knee immobilizer in place. Extremities: warm/dry, mild edema, no erythema or echymosis.  No lymphadenopathy. Pulses: Popliteus 2+ MSK:  ROM: Full ankle ROM, MMT: patient is able to perform quad set, (-) Homan's    LABS  Recent Labs  03/17/16 0907 03/18/16 0431  HGB 13.8 13.1  WBC 10.0 11.5*  PLT 337 355    Recent Labs  03/17/16 0907 03/18/16 0847  NA 139 137  K 3.3* 4.0  CL 102 102  CO2 29 26  BUN 17 24*  CREATININE 0.62 1.06*  GLUCOSE 97 110*    Recent Labs  03/17/16 0907  INR 0.94     Assessment/Plan:   Procedure(s) (LRB): OPEN REDUCTION INTERNAL (ORIF) FIXATION PATELLA (Right)  WBAT in immoblizer R LE Up with assistance. Plan for conservative outpatient management with Dr. Stann Mainland. Plan for D/C back to SNF today Tylenol and IBU for pain control as needed.  Mechele Claude, PA-C, [employer] Office:  [phone]

## 2016-03-19 NOTE — Clinical Social Work Note (Signed)
Patient was admitted from [facility] on 03/17/16.  Per MD- patient is stable for d/c back to facility today. CSW spoke with Financial risk analyst at [facility] who indicated that patient was already a resident of the SNF unit there and is ok for return to facility. Nursing notified to call report.  CSW spoke with daughter [name] who was very pleased with d/c plan today back to SNF.  Pt is alert, oriented only to self due to advance dementia.  EMS notified to transport patient; Fl2 completed. DC summary and transfer report along with FL2 sent to facility.  No further CSW needs identified;  CSW signing off.  Lorie Phenix. Pauline Good, [place]  (weekend coverage)

## 2016-03-19 NOTE — Evaluation (Addendum)
Physical Therapy Evaluation Patient Details Name: [name] MRN: [id number] DOB: [birth year] Today's Date: 03/19/2016   History of Present Illness  [name] is a 83 y.o. female with medical history significant of dementia and hypertension who resides in a skilled nursing facility and reportedly sustained a mechanical fall 1 day prior on 2/22 resulting in R patella fx and rib fx's  Clinical Impression  Pt admitted with above diagnosis. Pt currently with functional limitations due to the deficits listed below (see PT Problem List).   Pt will benefit from skilled PT to increase their independence and safety with mobility to allow discharge to the venue listed below.   Recommend return to SNF, will follow in acute setting     Follow Up Recommendations SNF    Equipment Recommendations  None recommended by PT    Recommendations for Other Services       Precautions / Restrictions Precautions Precautions: Fall Precaution Comments: No ROM righ tknee Required Braces or Orthoses: Knee Immobilizer - Right Knee Immobilizer - Right: On at all times Restrictions Weight Bearing Restrictions: No Other Position/Activity Restrictions: WBAT      Mobility  Bed Mobility Overal bed mobility: Needs Assistance Bed Mobility: Supine to Sit     Supine to sit: Max assist;Total assist;+2 for safety/equipment;+2 for physical assistance     General bed mobility comments: assist for trunk and LEs, bed pad utilized to scoot pt  Transfers Overall transfer level: Needs assistance Equipment used: Rolling walker (2 wheeled) Transfers: Sit to/from [facility] Sit to Stand: Min assist;+2 safety/equipment Stand pivot transfers: Min assist;+2 safety/equipment       General transfer comment: multi-modal cues for sequence  Ambulation/Gait             General Gait Details: pivotal steps to chair, limited by pain  Stairs            Wheelchair Mobility     Modified Rankin (Stroke Patients Only)       Balance Overall balance assessment: Needs assistance;History of Falls   Sitting balance-Leahy Scale: Fair       Standing balance-Leahy Scale: Poor Standing balance comment: light UE assist                             Pertinent Vitals/Pain Pain Assessment: Faces Faces Pain Scale: Hurts even more Pain Location: right knee Pain Descriptors / Indicators: Grimacing;Guarding Pain Intervention(s): Limited activity within patient's tolerance;Monitored during session;Repositioned    Home Living Family/patient expects to be discharged to:: Skilled nursing facility                 Additional Comments: pt resides in "Healthcare" at [facility] per pt dtr    Prior Function Level of Independence: Needs assistance   Gait / Transfers Assistance Needed: uses RW, amb to DR prior to admission per pt dtr  ADL's / Homemaking Assistance Needed: assist        Hand Dominance        Extremity/Trunk Assessment   Upper Extremity Assessment Upper Extremity Assessment: Generalized weakness    Lower Extremity Assessment Lower Extremity Assessment: Generalized weakness;RLE deficits/detail RLE: Unable to fully assess due to immobilization       Communication   Communication: HOH  Cognition Arousal/Alertness: Awake/alert Behavior During Therapy: WFL for tasks assessed/performed Overall Cognitive Status: History of cognitive impairments - at baseline  General Comments: pt is without her hearind aids and is extremely HOH--difficulty to communicate, used majority visual and tactile cues for pt participation    General Comments      Exercises     Assessment/Plan    PT Assessment Patient needs continued PT services  PT Problem List Decreased strength;Decreased activity tolerance;Decreased balance;Decreased cognition;Decreased knowledge of use of DME;Decreased mobility;Decreased safety  awareness;Decreased knowledge of precautions;Pain       PT Treatment Interventions DME instruction;Gait training;Functional mobility training;Therapeutic activities;Therapeutic exercise;Patient/family education    PT Goals (Current goals can be found in the Care Plan section)  Acute Rehab PT Goals Patient Stated Goal: back to SNF at [facility] PT Goal Formulation: With family Time For Goal Achievement: 03/26/16 Potential to Achieve Goals: Good    Frequency Min 3X/week   Barriers to discharge        Co-evaluation               End of Session Equipment Utilized During Treatment: Gait belt;Right knee immobilizer Activity Tolerance: Patient limited by pain;Other (comment) (ltd by HOH/cognition) Patient left: in chair;with call bell/phone within reach;with family/visitor present Nurse Communication: Mobility status PT Visit Diagnosis: History of falling (Z91.81)    Functional Assessment Tool Used: AM-PAC 6 Clicks Basic Mobility;Clinical judgement Functional Limitation: Mobility: Walking and moving around Mobility: Walking and Moving Around Current Status [id number]): At least 60 percent but less than 80 percent impaired, limited or restricted Mobility: Walking and Moving Around Goal Status [phone]): At least 40 percent but less than 60 percent impaired, limited or restricted    Time: 0949-1005 PT Time Calculation (min) (ACUTE ONLY): 16 min   Charges:   PT Evaluation $PT Eval Moderate Complexity: 1 Procedure     PT G Codes:   PT G-Codes **NOT FOR INPATIENT CLASS** Functional Assessment Tool Used: AM-PAC 6 Clicks Basic Mobility;Clinical judgement Functional Limitation: Mobility: Walking and moving around Mobility: Walking and Moving Around Current Status [id number]): At least 60 percent but less than 80 percent impaired, limited or restricted Mobility: Walking and Moving Around Goal Status [phone]): At least 40 percent but less than 60 percent impaired, limited or restricted      [facility] 03/19/2016, 11:55 AM

## 2016-03-19 NOTE — Progress Notes (Signed)
Fracture of multiple ribs  Subjective: Pain appears to be controlled, pt is non-verbal  Objective: Vital signs in last 24 hours: Temp:  [98.1 F (36.7 C)-98.2 F (36.8 C)] 98.1 F (36.7 C) (02/24 2218) Pulse Rate:  [61-62] 61 (02/24 2218) Resp:  [15-18] 15 (02/24 2218) BP: (146-148)/(54-62) 148/54 (02/24 2218) SpO2:  [96 %] 96 % (02/24 2218) Last BM Date:  (unknown)  Intake/Output from previous day: 02/24 0701 - 02/25 0700 In: 600 [P.O.:600] Out: -  Intake/Output this shift: No intake/output data recorded.  General appearance: distracted and no distress Resp: clear to auscultation bilaterally Chest wall: no tenderness, mild left sided chest wall tenderness GI: normal findings: soft, non-tender  Lab Results:  Results for orders placed or performed during the hospital encounter of 03/17/16 (from the past 24 hour(s))  Basic metabolic panel     Status: Abnormal   Collection Time: 03/18/16  8:47 AM  Result Value Ref Range   Sodium 137 135 - 145 mmol/L   Potassium 4.0 3.5 - 5.1 mmol/L   Chloride 102 101 - 111 mmol/L   CO2 26 22 - 32 mmol/L   Glucose, Bld 110 (H) 65 - 99 mg/dL   BUN 24 (H) 6 - 20 mg/dL   Creatinine, Ser 1.06 (H) 0.44 - 1.00 mg/dL   Calcium 9.0 8.9 - 10.3 mg/dL   GFR calc non Af Amer 43 (L) >60 mL/min   GFR calc Af Amer 50 (L) >60 mL/min   Anion gap 9 5 - 15     Studies/Results Radiology     MEDS, Scheduled . citalopram  10 mg Oral Daily  . levothyroxine  25 mcg Oral QAC breakfast  . metoprolol tartrate  12.5 mg Oral BID  . polyethylene glycol  17 g Oral Daily  . risperiDONE  0.5 mg Oral QHS     Assessment: Fracture of multiple ribs Patellar fracture  Plan: Incentive spirometry q1h while awake Maintain adequate pain control for deep breathing Patellar fracture per Ortho recs   LOS: 0 days    Rosario Adie, MD [facility], [place] [phone]   03/19/2016 7:52 AM

## 2016-03-19 NOTE — Progress Notes (Signed)
OT Cancellation Note  Patient Details Name: [name] MRN: [id number] DOB: 08-31-1917   Cancelled Treatment:    Reason Eval/Treat Not Completed: Other (comment) Will defer OT eval to SNF.   Pauline Aus [place] 03/19/2016, 11:48 AM

## 2016-03-19 NOTE — Progress Notes (Signed)
[facility]  PROGRESS NOTE  [name] [id number] DOB: 01-27-1917 DOA: 03/17/2016 PCP: Jeanmarie Hubert, MD   Brief HPI:    83 y.o. female with medical history significant of dementia and hypertension who resides in a skilled nursing facility and reportedly sustained a mechanical fall 1 day prior on 2/22. This was not witnessed. There was no reported head injury. Patient had x-rays done at the facility which then came back and noted a right patellar fracture. Patient was then sent up to the emergency room for further evaluation. ED Course: Today, in the emergency room, x-ray of the right knee noted a complete transverse fracture of the mid patella. Patient also underwent a full set of CT films for evaluation of her trauma. She is found to have a chronic calcified mass in her pelvis which was unchanged. She was noted to have an incidental left renal lesion. She was noted to have a saccular aneurysm or descending drastically aorta which was approximately 3.4 cm. Patient also noted to have a chronic T12 compression fracture. We'll concerning for multiple displaced fractures of the left second and third as well as posterior left ninth and 11th and 12th and right 10th. With the findings for her knee and multiple rib fractures, orthopedic and trauma surgery were consulted. With the surgery recommended a knee immobilizer, or unclear given her advanced age and advanced dementia surgery would be appropriate. Trauma surgery had some recommendations, but felt that most she would need overnight observation. Given her advanced age, dementia and multiple surgical issues spanning multiple services, hospitalists were consulted for admission  Subjective   Patient  Seen and examined, nonverbal. Does not appear to be in pain.   Assessment/Plan:     1. Closed patella sleeve fracture of left knee-  surgery was initially planned for day but family was hesitant to go for surgery at this age and would like  to  wait for few weeks before Deciding for surgery. I called and discussed with Dr. Stann Mainland, who agrees with holding off surgery at this time, and observe with conservative management. Patient is weight bearing as tolerated with knee brace. 2. Fracture of  multiple ribs- Seen by trauma  surgery, continue incentive spirometry every hour while awake.Surprisingly patient is not in severe pain, continue Tylenol when necessary for pain. 3. Left renal mass: Incidentally noted. Could better evaluate with a dedicated MRI, however this would not necessarily change anything as aggressive intervention such as a nephrectomy she did indeed have a renal cell carcinoma would not be done on this patient given her advanced age, dementia and DO NOT RESUSCITATE status 4. Hypothyroidism- stable, continue Synthroid 5. 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 Hypertension- blood pressure is stable,Continue metoprolol 12.5 mg twice a day 7. Constipation- Continue MiraLAX When necessary    DVT prophylaxis: SCDs  Code Status: DO NOT RESSCITATE  Family Communication: Discussed with patient's daughter and son-in-law at bedside   Disposition Plan: Likely back to skilled Facility In next 24-48 hs   Consultants:  Orthopedic surgery  Trauma surgery  Procedures:  None     Antibiotics:   Anti-infectives    None       Objective   Vitals:   03/17/16 2236 03/18/16 0613 03/18/16 1340 03/18/16 2218  BP: (!) 148/90 (!) 159/57 (!) 146/62 (!) 148/54  Pulse: (!) 59 67 62 61  Resp: 16 18 18 15   Temp: 98 F (36.7 C)  98.6 F (37 C) 98.2 F (36.8 C) 98.1 F (36.7 C)  TempSrc: Oral Oral Axillary Axillary  SpO2: 98% 94% 96% 96%  Weight:      Height:        Intake/Output Summary (Last 24 hours) at 03/19/16 1110 Last data filed at 03/18/16 2219  Gross per 24 hour  Intake              480 ml  Output                0 ml  Net               480 ml   Filed Weights   03/17/16 0843  Weight: 54.8 kg (120 lb 12.8 oz)     Physical Examination:  General exam: Appears calm and comfortable. Respiratory system: Clear to auscultation. Respiratory effort normal. Cardiovascular system:  RRR. No  murmurs, rubs, gallops. No pedal edema. GI system: Abdomen is nondistended, soft and nontender. No organomegaly.  Central nervous system. No focal neurological deficits. 5 x 5 power in all extremities. Skin: No rashes, lesions or ulcers. Psychiatry: Alert, Pleasantly confused    Data Reviewed: I have personally reviewed following labs and imaging studies  CBG: No results for input(s): GLUCAP in the last 168 hours.  CBC:  Recent Labs Lab 03/17/16 0907 03/18/16 0431  WBC 10.0 11.5*  NEUTROABS 6.8  --   HGB 13.8 13.1  HCT 40.5 38.5  MCV 96.4 96.3  PLT 337 [id number]    Basic Metabolic Panel:  Recent Labs Lab 03/17/16 0907 03/18/16 0847  NA 139 137  K 3.3* 4.0  CL 102 102  CO2 29 26  GLUCOSE 97 110*  BUN 17 24*  CREATININE 0.62 1.06*  CALCIUM 9.1 9.0    Recent Results (from the past 240 hour(s))  MRSA PCR Screening     Status: None   Collection Time: 03/18/16  2:00 AM  Result Value Ref Range Status   MRSA by PCR NEGATIVE NEGATIVE Final    Comment:        The GeneXpert MRSA Assay (FDA approved for NASAL specimens only), is one component of a comprehensive MRSA colonization surveillance program. It is not intended to diagnose MRSA infection nor to guide or monitor treatment for MRSA infections.      Liver Function Tests:  Recent Labs Lab 03/17/16 0907  AST 18  ALT 13*  ALKPHOS 59  BILITOT 1.1  PROT 6.9  ALBUMIN 4.2      Studies: Dg Chest 2 View  Result Date: 03/17/2016 CLINICAL DATA:  Witnessed fall last night. EXAM: CHEST  2 VIEW COMPARISON:  01/21/2016 FINDINGS: Frontal view degraded by motion. There is left ventricular prominence. There is chronic aortic atherosclerosis. Right lung is  clear. There is abnormal density in the left lower lobe that could be atelectasis and/or pneumonia. Compared to the previous study, there is slight further loss of height of a lower thoracic vertebral body. Old augmented compression fracture down lower appears unchanged. IMPRESSION: Left lower lobe atelectasis and/or pneumonia. Slight compression fracture in the mid to lower thoracic spine. Electronically Signed   By: Nelson Chimes M.D.   On: 03/17/2016 09:34   Ct Head Wo Contrast  Result Date: 03/17/2016 CLINICAL DATA:  Pain following fall.  Underlying dementia EXAM: CT HEAD WITHOUT CONTRAST CT CERVICAL SPINE WITHOUT CONTRAST TECHNIQUE: Multidetector CT imaging of the head and cervical spine was performed following the standard protocol without intravenous contrast. Multiplanar CT image reconstructions  of the cervical spine were also generated. COMPARISON:  CT head January 21, 2016; CT cervical spine November 10, 2015 FINDINGS: CT HEAD FINDINGS Brain: Moderate diffuse atrophy is stable. There is no evident intracranial mass, hemorrhage, extra-axial fluid collection, or midline shift. There is small vessel disease throughout the centra semiovale bilaterally, stable. No new gray-white compartment lesion is evident. No acute infarct appreciable. Calcification along the tentorium bilaterally is stable. Vascular: No hyper dense vessels. There is extensive carotid siphon calcification bilaterally as well as calcification in both distal vertebral arteries. Skull: The bony calvarium is intact. Sinuses/Orbits: There is opacification in a posterior right ethmoid air cell. There is slight mucosal thickening in several ethmoid air cells elsewhere. Other visualized paranasal sinuses are clear. Visualized orbits appear symmetric bilaterally. Note that the patient has had cataract removal on the right, stable. Other: Mastoid air cells are clear CT CERVICAL SPINE FINDINGS Alignment: There is cervical dextroscoliosis. There is  just over 3 mm of anterolisthesis of C3 on C4, slightly increased from prior study. There is just under 2 mm of anterolisthesis of C4 on C5, stable. No spondylolisthesis is evident elsewhere. Skull base and vertebrae: Skull base and craniocervical junction regions appear normal. There is moderate pannus posterior to the odontoid which is not causing appreciable impression on the craniocervical junction. There is no evident fracture. There are no blastic or lytic bone lesions. Soft tissues and spinal canal: The prevertebral soft tissues and predental space regions are normal. No paraspinous lesions are evident. There is no cord or canal hematoma evident. No spinal stenosis appreciable. Disc levels: There is marked disc space narrowing at C4-5. There is moderately severe disc space narrowing at C5-6 and C6-7. There is moderate disc space narrowing at C3-4 and C7-T1. There is facet hypertrophy at most levels bilaterally. There is no frank disc extrusion. Upper chest: There is aortic atherosclerosis. There is mild scarring in the lung apices superiorly. Other: There is calcification in both carotid arteries. There are multiple small nodular lesions in the thyroid without dominant thyroid mass evident. IMPRESSION: CT head: Stable atrophy with extensive supratentorial small vessel disease. No acute infarct evident. No mass, hemorrhage, or extra-axial fluid collection. Multiple foci of arteriovascular calcification present. There is ethmoid sinus disease, most notably in a posterior right ethmoid air cell, where there is opacification of much of this air cell. CT cervical spine: No fracture. Spondylolisthesis at C3-4 and C4-5. Note that there is slightly more anterolisthesis at C3-4 than on prior study. Spondylolisthesis at C4-5 stable. The spondylolisthesis at these levels is felt to be due to spondylosis underlying. There is multifocal arthropathy. No frank disc extrusion or high-grade stenosis. Aortic atherosclerosis.  Bilateral carotid artery calcification. Multinodular goiter without dominant thyroid mass. Electronically Signed   By: Lowella Grip III M.D.   On: 03/17/2016 09:44   Ct Chest W Contrast  Result Date: 03/17/2016 CLINICAL DATA:  Patient status post fall. Evaluate for traumatic injury. EXAM: CT CHEST, ABDOMEN, AND PELVIS WITH CONTRAST TECHNIQUE: Multidetector CT imaging of the chest, abdomen and pelvis was performed following the standard protocol during bolus administration of intravenous contrast. CONTRAST:  57mL ISOVUE-300 IOPAMIDOL (ISOVUE-300) INJECTION 61% COMPARISON:  MR pelvis 01/04/2010 FINDINGS: CT CHEST FINDINGS Cardiovascular: Heart is enlarged. No pericardial effusion. Peripheral atherosclerotic plaque involving the thoracic aorta. 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 The aorta measures up to 3.4 cm at this location (image 27; series 2). Mediastinum/Nodes: No enlarged axillary, mediastinal or hilar lymphadenopathy. Lungs/Pleura: Central  airways are patent. Dependent atelectasis within the bilateral lower lobes. Calcified granuloma within the left upper lobe. 4 mm right middle lobe nodule (image 74; series 4). No pleural effusion or pneumothorax. Musculoskeletal: Mildly displaced fracture through the anterior left second and third ribs (image 47; series 4) (image 70; series 4). Nondisplaced posterior left ninth, twelfth and eleventh rib fractures. Chronic appearing T12 compression deformity with kyphoplasty material. Narrowing of the spinal canal at this level. Nondisplaced right tenth rib fracture. CT ABDOMEN PELVIS FINDINGS Hepatobiliary: Liver is normal in size and contour. Gallbladder is unremarkable. No intrahepatic or extrahepatic biliary ductal dilatation. Pancreas: Unremarkable Spleen: Unremarkable Adrenals/Urinary Tract: The adrenal glands are normal. Kidneys enhance symmetrically with contrast. There is a 1.7 x 1.8 cm exophytic lesion off the interpolar  region of the left kidney within internal density of 49 Hounsfield units (image 58; series 2). Urinary bladder is dilated. Stomach/Bowel: Pancolonic diverticulosis. No CT evidence for acute diverticulitis. No free fluid or free intraperitoneal air. No evidence for bowel obstruction. Vascular/Lymphatic: Peripheral calcified atherosclerotic plaque involving the abdominal aorta. No retroperitoneal lymphadenopathy. Reproductive: There is a large partially calcified right adnexal mass and associated cyst. Cyst measures up to 4 cm stone in dimension. Sizes this mass appears similar to prior MRI 01/04/2010. Other: None. Musculoskeletal: Old left inferior pubic ramus fracture. Osseous demineralization. Lumbar spine degenerative changes. No aggressive or acute appearing osseous lesions. IMPRESSION: Left anterior second and third mildly displaced rib fractures. Nondisplaced posterior left ninth, eleventh and twelfth rib fractures. Nondisplaced right tenth rib fracture. Chronic appearing T12 compression deformity with kyphoplasty material. There is narrowing of the spinal canal at this level. Otherwise no acute process within the chest, abdomen or pelvis. Indeterminate dense exophytic mass off the interpolar region of the left kidney. This may represent a complicated cyst or solid renal mass. Recommend dedicated evaluation with pre and post contrast-enhanced MRI or CT in the non acute setting. 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 Given the patient's age, this is likely of doubtful clinical significance however vascular consultation can be performed as clinically indicated. Indeterminate calcified and cystic mass within the right adnexa. Grossly this appears similar in size when compared to prior MRI exams. This is nonspecific in etiology however may be benign given stability over time. Pancolonic diverticulosis. Electronically Signed   By: Lovey Newcomer M.D.    On: 03/17/2016 11:34   Ct Cervical Spine Wo Contrast  Result Date: 03/17/2016 CLINICAL DATA:  Pain following fall.  Underlying dementia EXAM: CT HEAD WITHOUT CONTRAST CT CERVICAL SPINE WITHOUT CONTRAST TECHNIQUE: Multidetector CT imaging of the head and cervical spine was performed following the standard protocol without intravenous contrast. Multiplanar CT image reconstructions of the cervical spine were also generated. COMPARISON:  CT head January 21, 2016; CT cervical spine November 10, 2015 FINDINGS: CT HEAD FINDINGS Brain: Moderate diffuse atrophy is stable. There is no evident intracranial mass, hemorrhage, extra-axial fluid collection, or midline shift. There is small vessel disease throughout the centra semiovale bilaterally, stable. No new gray-white compartment lesion is evident. No acute infarct appreciable. Calcification along the tentorium bilaterally is stable. Vascular: No hyper dense vessels. There is extensive carotid siphon calcification bilaterally as well as calcification in both distal vertebral arteries. Skull: The bony calvarium is intact. Sinuses/Orbits: There is opacification in a posterior right ethmoid air cell. There is slight mucosal thickening in several ethmoid air cells elsewhere. Other visualized paranasal sinuses are clear. Visualized orbits appear symmetric bilaterally. Note  that the patient has had cataract removal on the right, stable. Other: Mastoid air cells are clear CT CERVICAL SPINE FINDINGS Alignment: There is cervical dextroscoliosis. There is just over 3 mm of anterolisthesis of C3 on C4, slightly increased from prior study. There is just under 2 mm of anterolisthesis of C4 on C5, stable. No spondylolisthesis is evident elsewhere. Skull base and vertebrae: Skull base and craniocervical junction regions appear normal. There is moderate pannus posterior to the odontoid which is not causing appreciable impression on the craniocervical junction. There is no evident  fracture. There are no blastic or lytic bone lesions. Soft tissues and spinal canal: The prevertebral soft tissues and predental space regions are normal. No paraspinous lesions are evident. There is no cord or canal hematoma evident. No spinal stenosis appreciable. Disc levels: There is marked disc space narrowing at C4-5. There is moderately severe disc space narrowing at C5-6 and C6-7. There is moderate disc space narrowing at C3-4 and C7-T1. There is facet hypertrophy at most levels bilaterally. There is no frank disc extrusion. Upper chest: There is aortic atherosclerosis. There is mild scarring in the lung apices superiorly. Other: There is calcification in both carotid arteries. There are multiple small nodular lesions in the thyroid without dominant thyroid mass evident. IMPRESSION: CT head: Stable atrophy with extensive supratentorial small vessel disease. No acute infarct evident. No mass, hemorrhage, or extra-axial fluid collection. Multiple foci of arteriovascular calcification present. There is ethmoid sinus disease, most notably in a posterior right ethmoid air cell, where there is opacification of much of this air cell. CT cervical spine: No fracture. Spondylolisthesis at C3-4 and C4-5. Note that there is slightly more anterolisthesis at C3-4 than on prior study. Spondylolisthesis at C4-5 stable. The spondylolisthesis at these levels is felt to be due to spondylosis underlying. There is multifocal arthropathy. No frank disc extrusion or high-grade stenosis. Aortic atherosclerosis. Bilateral carotid artery calcification. Multinodular goiter without dominant thyroid mass. Electronically Signed   By: Lowella Grip III M.D.   On: 03/17/2016 09:44   Ct Thoracic Spine Wo Contrast  Result Date: 03/17/2016 CLINICAL DATA:  Back pain, fell last night, dementia EXAM: CT THORACIC AND LUMBAR SPINE WITHOUT CONTRAST TECHNIQUE: Multidetector CT imaging of the thoracic and lumbar spine was performed without  contrast. Multiplanar CT image reconstructions were also generated. COMPARISON:  Chest radiograph 01/21/2016, MR pelvis 01/04/2010 FINDINGS: CT THORACIC SPINE FINDINGS Alignment: 12 rib pairs. Mild dextroconvex thoracic scoliosis. Otherwise normal. Vertebrae: Marked osseous demineralization. Chronic marked compression fracture of T12 vertebral body with retropulsion of a posterior fragment, significant AP narrowing of the spinal canal, and evidence of spinal augmentation procedure. Remaining vertebral body heights maintained. Few scattered endplate spurs and mild disc space narrowing at mid thoracic levels. No acute thoracic spine fracture or subluxation Paraspinal and other soft tissues: Extensive atherosclerotic calcification aorta with aneurysmal dilatation of the descending thoracic aorta to 3.2 x 3.5 cm image 68. Coronary arterial calcifications. Heart appears enlarged. No definite thoracic adenopathy. Dependent atelectasis in both lungs. Calcified granuloma LEFT upper lobe. Disc levels: Grossly unremarkable. CT LUMBAR SPINE FINDINGS Segmentation: 5 non-rib-bearing lumbar type vertebra. Alignment: Normal Vertebrae: Marked osseous demineralization. Vertebral body heights maintained without acute fracture or subluxation. Multilevel facet degenerative changes. SI joints appear symmetric and preserved. No obvious sacral fracture. Paraspinal and other soft tissues: Extensive atherosclerotic calcifications abdominal aorta and iliac arteries. Partially calcified mass in the RIGHT pelvis, 6.9 x 5.3 x 8.2 cm, uncertain if arising from uterus or adnexa diet present  since and grossly unchanged since an MRI of the pelvis from 2011. Disc levels: Diffusely narrowed.  No gross disc herniation. IMPRESSION: CT THORACIC SPINE IMPRESSION Marked osseous demineralization. Old T12 compression fracture and vertebroplasty with stable retropulsion of a dominant fragment and AP narrowing of the spinal canal. No acute thoracic spine  abnormalities. CT LUMBAR SPINE IMPRESSION Osseous demineralization with degenerative disc and facet disease changes lumbar spine. No acute lumbar spine abnormalities. Partially calcified 6.9 x 5.3 x 8.2 cm diameter mass within the RIGHT pelvis, present since 2011, either of RIGHT ovarian or less likely uterine origin, favor benign in light of time this has been present and similar in appearance. Extensive aortic atherosclerosis with mild aneurysmal dilatation of the descending thoracic aorta up to 3.2 x 3.5 cm. Coronary arterial calcifications. Electronically Signed   By: Lavonia Dana M.D.   On: 03/17/2016 11:51   Ct Lumbar Spine Wo Contrast  Result Date: 03/17/2016 CLINICAL DATA:  Back pain, fell last night, dementia EXAM: CT THORACIC AND LUMBAR SPINE WITHOUT CONTRAST TECHNIQUE: Multidetector CT imaging of the thoracic and lumbar spine was performed without contrast. Multiplanar CT image reconstructions were also generated. COMPARISON:  Chest radiograph 01/21/2016, MR pelvis 01/04/2010 FINDINGS: CT THORACIC SPINE FINDINGS Alignment: 12 rib pairs. Mild dextroconvex thoracic scoliosis. Otherwise normal. Vertebrae: Marked osseous demineralization. Chronic marked compression fracture of T12 vertebral body with retropulsion of a posterior fragment, significant AP narrowing of the spinal canal, and evidence of spinal augmentation procedure. Remaining vertebral body heights maintained. Few scattered endplate spurs and mild disc space narrowing at mid thoracic levels. No acute thoracic spine fracture or subluxation Paraspinal and other soft tissues: Extensive atherosclerotic calcification aorta with aneurysmal dilatation of the descending thoracic aorta to 3.2 x 3.5 cm image 68. Coronary arterial calcifications. Heart appears enlarged. No definite thoracic adenopathy. Dependent atelectasis in both lungs. Calcified granuloma LEFT upper lobe. Disc levels: Grossly unremarkable. CT LUMBAR SPINE FINDINGS Segmentation: 5  non-rib-bearing lumbar type vertebra. Alignment: Normal Vertebrae: Marked osseous demineralization. Vertebral body heights maintained without acute fracture or subluxation. Multilevel facet degenerative changes. SI joints appear symmetric and preserved. No obvious sacral fracture. Paraspinal and other soft tissues: Extensive atherosclerotic calcifications abdominal aorta and iliac arteries. Partially calcified mass in the RIGHT pelvis, 6.9 x 5.3 x 8.2 cm, uncertain if arising from uterus or adnexa diet present since and grossly unchanged since an MRI of the pelvis from 2011. Disc levels: Diffusely narrowed.  No gross disc herniation. IMPRESSION: CT THORACIC SPINE IMPRESSION Marked osseous demineralization. Old T12 compression fracture and vertebroplasty with stable retropulsion of a dominant fragment and AP narrowing of the spinal canal. No acute thoracic spine abnormalities. CT LUMBAR SPINE IMPRESSION Osseous demineralization with degenerative disc and facet disease changes lumbar spine. No acute lumbar spine abnormalities. Partially calcified 6.9 x 5.3 x 8.2 cm diameter mass within the RIGHT pelvis, present since 2011, either of RIGHT ovarian or less likely uterine origin, favor benign in light of time this has been present and similar in appearance. Extensive aortic atherosclerosis with mild aneurysmal dilatation of the descending thoracic aorta up to 3.2 x 3.5 cm. Coronary arterial calcifications. Electronically Signed   By: Lavonia Dana M.D.   On: 03/17/2016 11:51   Ct Abdomen Pelvis W Contrast  Result Date: 03/17/2016 CLINICAL DATA:  Patient status post fall. Evaluate for traumatic injury. EXAM: CT CHEST, ABDOMEN, AND PELVIS WITH CONTRAST TECHNIQUE: Multidetector CT imaging of the chest, abdomen and pelvis was performed following the standard protocol during bolus administration of  intravenous contrast. CONTRAST:  48mL ISOVUE-300 IOPAMIDOL (ISOVUE-300) INJECTION 61% COMPARISON:  MR pelvis 01/04/2010  FINDINGS: CT CHEST FINDINGS Cardiovascular: Heart is enlarged. No pericardial effusion. Peripheral atherosclerotic plaque involving the thoracic aorta. 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 The aorta measures up to 3.4 cm at this location (image 27; series 2). Mediastinum/Nodes: No enlarged axillary, mediastinal or hilar lymphadenopathy. Lungs/Pleura: Central airways are patent. Dependent atelectasis within the bilateral lower lobes. Calcified granuloma within the left upper lobe. 4 mm right middle lobe nodule (image 74; series 4). No pleural effusion or pneumothorax. Musculoskeletal: Mildly displaced fracture through the anterior left second and third ribs (image 47; series 4) (image 70; series 4). Nondisplaced posterior left ninth, twelfth and eleventh rib fractures. Chronic appearing T12 compression deformity with kyphoplasty material. Narrowing of the spinal canal at this level. Nondisplaced right tenth rib fracture.   CT ABDOMEN PELVIS FINDINGS  IMPRESSION: Left anterior second and third mildly displaced rib fractures. Nondisplaced posterior left ninth, eleventh and twelfth rib fractures. Nondisplaced right tenth rib fracture. Chronic appearing T12 compression deformity with kyphoplasty material. There is narrowing of the spinal canal at this level. Otherwise no acute process within the chest, abdomen or pelvis. Indeterminate dense exophytic mass off the interpolar region of the left kidney. This may represent a complicated cyst or solid renal mass. Recommend dedicated evaluation with pre and post contrast-enhanced MRI or CT in the non acute setting. 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 Given the patient's age, this is likely of doubtful clinical significance however vascular consultation can be performed as clinically indicated. Indeterminate calcified and cystic mass within the right adnexa. Grossly this  appears similar in size when compared to prior MRI exams. This is nonspecific in etiology however may be benign given stability over time. Pancolonic diverticulosis. Electronically Signed   By: Lovey Newcomer M.D.   On: 03/17/2016 11:34   Dg Knee Complete 4 Views Right  Result Date: 03/17/2016 CLINICAL DATA:  Witnessed fall last night.  Pain. EXAM: RIGHT KNEE - COMPLETE 4+ VIEW COMPARISON:  None. FINDINGS: Complete transverse fracture of the patella, displaced a few mm with slight offset. No fracture of the femur, tibia or fibula. Small lipoma hemarthrosis. IMPRESSION: Complete transverse fracture of the mid patella. Electronically Signed   By: Nelson Chimes M.D.   On: 03/17/2016 09:37   Dg Hip Unilat With Pelvis 2-3 Views Right  Result Date: 03/17/2016 CLINICAL DATA:  Witnessed fall last night. EXAM: DG HIP (WITH OR WITHOUT PELVIS) 2-3V RIGHT COMPARISON:  None. FINDINGS: No evidence of pelvic or hip fracture. Extensive pelvic calcifications presumed represent chronic benign uterine disease. Old healed rami fractures on the left. IMPRESSION: No acute finding. Electronically Signed   By: Nelson Chimes M.D.   On: 03/17/2016 09:35   Dg Femur, Min 2 Views Right  Result Date: 03/17/2016 CLINICAL DATA:  Witnessed fall last night.  Pain. EXAM: RIGHT FEMUR 2 VIEWS COMPARISON:  None. FINDINGS: There is no evidence of fracture or other focal bone lesions. Soft tissues are unremarkable. IMPRESSION: Negative. Electronically Signed   By: Nelson Chimes M.D.   On: 03/17/2016 09:36    Scheduled Meds: . citalopram  10 mg Oral Daily  . levothyroxine  25 mcg Oral QAC breakfast  . metoprolol tartrate  12.5 mg Oral BID  . polyethylene glycol  17 g Oral Daily  . risperiDONE  0.5 mg Oral QHS      Time  spent: 25 min  [facility] Pager [phone]. If 7PM-7AM, please contact night-coverage at [email or web address], Office  [phone]  password [email or web address] 03/19/2016, 11:10 AM  LOS: 0 days

## 2016-03-20 ENCOUNTER — Encounter: Payer: Self-pay | Admitting: Internal Medicine

## 2016-03-20 ENCOUNTER — Non-Acute Institutional Stay (SKILLED_NURSING_FACILITY): Payer: Medicare Other | Admitting: Internal Medicine

## 2016-03-20 DIAGNOSIS — F0390 Unspecified dementia without behavioral disturbance: Secondary | ICD-10-CM | POA: Diagnosis not present

## 2016-03-20 DIAGNOSIS — S82092A Other fracture of left patella, initial encounter for closed fracture: Secondary | ICD-10-CM | POA: Diagnosis not present

## 2016-03-20 DIAGNOSIS — R443 Hallucinations, unspecified: Secondary | ICD-10-CM

## 2016-03-20 DIAGNOSIS — I10 Essential (primary) hypertension: Secondary | ICD-10-CM | POA: Diagnosis not present

## 2016-03-20 DIAGNOSIS — F321 Major depressive disorder, single episode, moderate: Secondary | ICD-10-CM | POA: Diagnosis not present

## 2016-03-20 DIAGNOSIS — S2242XD Multiple fractures of ribs, left side, subsequent encounter for fracture with routine healing: Secondary | ICD-10-CM | POA: Diagnosis not present

## 2016-03-20 DIAGNOSIS — M6530 Trigger finger, unspecified finger: Secondary | ICD-10-CM | POA: Diagnosis not present

## 2016-03-20 DIAGNOSIS — W19XXXD Unspecified fall, subsequent encounter: Secondary | ICD-10-CM

## 2016-03-20 SURGERY — OPEN REDUCTION INTERNAL FIXATION (ORIF) PATELLA
Anesthesia: Choice | Laterality: Right

## 2016-03-20 NOTE — Progress Notes (Signed)
History and Physical      Location:  [place] Room Number: [place] of Service:  SNF (31)  PCP: Jeanmarie Hubert, MD Patient Care Team: Estill Dooms, MD as PCP - General (Internal Medicine) [facility] Gaynelle Arabian, MD as Consulting Physician (Orthopedic Surgery) Man Otho Darner, NP as Nurse Practitioner (Internal Medicine)  Extended Emergency Contact Information Primary Emergency Contact: Peri [place] of [place] Mobile Phone: [phone] Relation: Daughter Secondary Emergency Contact: Kerby Less [place] of [place] Phone: [phone] Relation: Daughter  Code Status: DNR Goals of Care: Advanced Directive information Advanced Directives 03/20/2016  Does Patient Have a Medical Advance Directive? Yes  Type of Paramedic of [place];Living will;Out of facility DNR (pink MOST or yellow form)  Does patient want to make changes to medical advance directive? -  Copy of [place] in Chart? Yes  Pre-existing out of facility DNR order (yellow form or pink MOST form) Yellow form placed in chart (order not valid for inpatient use)      Chief Complaint  Patient presents with  . New Admit To SNF    following hospitalization 03/17/26 to 03/19/16 closed transverse patella fracture of right knee secondary to fall    HPI: Patient is a 83 y.o. female seen today for admission to [facility] SN[facility] following hospitalization 03/17/16 to 03/19/16. She had a fall and sustained patellar fx of the right knee and rib fractures (left 2 and 3; left 9,11and 12; right 10).  There is an incidental finding of a left renal mass.  Other problems include dementia with behavioral disturbance, hypothyrodism, constipation. They are stable on current medications.  Past Medical History:  Diagnosis Date  . Abnormality of gait 03/15/2010  . Anxiety state, unspecified 03/15/2010  . Depression, major 06/15/2015  . Depressive  disorder, not elsewhere classified 03/15/2010  . Dysphagia, unspecified(787.20) 03/15/2010  . Generalized anxiety disorder 06/23/2014  . Herpes zoster without mention of complication [id number]  . Other alteration of consciousness 03/15/2010  . Pain in hand 08/13/12  . Personal history of fall 03/15/2010  . Senile osteoporosis 03/15/2010  . Unspecified constipation 03/15/2010  . Unspecified essential hypertension 03/15/2010  . Unspecified hearing loss 08/22/2011  . Unspecified urinary incontinence 03/15/2010  . Urinary incontinence 03/15/2010  . Vaginal prolapse 06/15/2015   Past Surgical History:  Procedure Laterality Date  . INCISION / DRAINAGE HAND / FINGER Right 06/2008   hand  . SPINE SURGERY  12/2006    reports that she has never smoked. She has never used smokeless tobacco. She reports that she drinks alcohol. She reports that she does not use drugs. Social History   Social History  . Marital status: Divorced    Spouse name: N/A  . Number of children: N/A  . Years of education: N/A   Occupational History  . [employer]    Social History Main Topics  . Smoking status: Never Smoker  . Smokeless tobacco: Never Used  . Alcohol use Yes     Comment: 2-4 oz of wine once daily at request  . Drug use: No  . Sexual activity: No   Other Topics Concern  . Not on file   Social History Narrative   Lives at [facility], in [place] since 12/28/2014   Widowed   Walker   Never smoked   Alcohol wine occasionally    Exercise walks a lot, with walker   POA, Living Will  Functional Status Survey:    Family History  Problem Relation Age of Onset  . Heart disease Father     MI    Health Maintenance  Topic Date Due  . DEXA SCAN  07/26/1984  . PNA vac Low Risk Adult (2 of 2 - PCV13) 01/23/2010  . TETANUS/TDAP  01/24/2019  . INFLUENZA VACCINE  Completed    Allergies  Allergen Reactions  . Ace Inhibitors Other (See Comments)    Reaction unknown/ listed on MAR  .  Hydrochlorothiazide Other (See Comments)    Reaction unknown/ listed on MAR  . Morphine And Related Other (See Comments)    Hallucinations    Allergies as of 03/20/2016      Reactions   Ace Inhibitors Other (See Comments)   Reaction unknown/ listed on MAR   Hydrochlorothiazide Other (See Comments)   Reaction unknown/ listed on MAR   Morphine And Related Other (See Comments)   Hallucinations      Medication List       Accurate as of 03/20/16 11:10 AM. Always use your most recent med list.          acetaminophen 325 MG tablet Commonly known as:  TYLENOL Take 2 tablets (650 mg total) by mouth every 6 (six) hours as needed for mild pain (or Fever >/= 101).   aspirin EC 81 MG tablet Take 1 tablet (81 mg total) by mouth daily.   CALCIUM 600+D 600-200 MG-UNIT Tabs Generic drug:  Calcium Carbonate-Vitamin D Take 1 tablet by mouth daily.   citalopram 10 MG tablet Commonly known as:  CELEXA Take one table once daily   cyanocobalamin 1000 MCG tablet Take 1,000 mcg by mouth. Take one tablet once daily for Vitamin B-12   ibuprofen 200 MG tablet Commonly known as:  ADVIL,MOTRIN Take 400 mg by mouth every 6 (six) hours as needed (for pain).   levothyroxine 25 MCG tablet Commonly known as:  SYNTHROID, LEVOTHROID Take 25 mcg by mouth daily before breakfast.   LORazepam 0.5 MG tablet Commonly known as:  ATIVAN Take 1 tablet (0.5 mg total) by mouth every 8 (eight) hours as needed for anxiety.   metoprolol tartrate 25 MG tablet Commonly known as:  LOPRESSOR Take 25 mg by mouth. Take 1/2 tablet (12.5 mg) twice a day   mineral oil-hydrophilic petrolatum ointment Apply 1 application topically daily as needed for dry skin.   polyethylene glycol packet Commonly known as:  MIRALAX / GLYCOLAX Take 17 g by mouth daily.   PROCTO-MED HC 2.5 % rectal cream Generic drug:  hydrocortisone Place 1 application rectally every 12 (twelve) hours as needed for hemorrhoids or itching.     risperiDONE 0.5 MG tablet Commonly known as:  RISPERDAL Take 0.5 mg by mouth. Take one tablet at bedtime       Review of Systems  Constitutional: Negative for activity change, appetite change and fatigue.  HENT: Positive for hearing loss. Negative for ear pain.   Eyes: Negative.   Respiratory: Negative.  Negative for shortness of breath.   Cardiovascular: Positive for leg swelling. Negative for chest pain and palpitations.       HTN  Gastrointestinal: Negative.   Endocrine: Negative.   Genitourinary:       Incontinence  Musculoskeletal: Positive for arthralgias (in feet).       Pain in both hands: R>L. Trigger fingers at roght 3rd and 4th. Contracted into palm most of the time. Right patellar fracture. Right leg in long leg Velcro closure splint.  Skin: Negative for rash (Erythematous rash of the lower legs nonspecific nature).  Neurological: Negative.   Hematological: Negative.   Psychiatric/Behavioral: Positive for confusion and sleep disturbance (Wakes at 3 AM. Full of energy then. Uses Motrin PM to get back to sleep. ). The patient is nervous/anxious.        Hx of hallucinations.    Vitals:   03/20/16 1057  BP: 138/70  Pulse: 71  Resp: 16  Temp: (!) 96.9 F (36.1 C)  SpO2: 95%  Weight: 118 lb (53.5 kg)  Height: 5\' 2"  (1.575 m)   Body mass index is 21.58 kg/m. Physical Exam  Constitutional: She appears well-developed and well-nourished. No distress.  HENT:  Head: Normocephalic and atraumatic.  Severe hearing loss bilaterally. Wearing hearing aids.  Eyes: Conjunctivae and EOM are normal. Pupils are equal, round, and reactive to light.  Neck: Normal range of motion. Neck supple. No JVD present. No tracheal deviation present. No thyromegaly present.  Cardiovascular: Normal rate, regular rhythm and normal heart sounds.  Exam reveals no gallop and no friction rub.   No murmur heard. Pulmonary/Chest: No respiratory distress. She has no wheezes. She has no rales. She  exhibits no tenderness.  Abdominal: Bowel sounds are normal. She exhibits no distension and no mass. There is no tenderness.  Genitourinary: Rectal exam shows guaiac negative stool.  Genitourinary Comments: Examined 06/15/15: Firm area at the top of the vagina. Prolapse of the vaginal walls. Loss of rectal sphincter tone.  Musculoskeletal: Normal range of motion. She exhibits edema. She exhibits no tenderness.  Pain in hands. Right 3rd and 4th trigger finges with contracturesr. Uses walker due to unstable gait. Left 2nd toe is deviated to the great toe and has elevated DIP joint that rubs on her shoe. Trace edema R+L ankles Right leg in long leg splint with Velcro closure.  Lymphadenopathy:    She has no cervical adenopathy.  Neurological: She is alert. No cranial nerve deficit. Coordination normal.  12/29/14 MMSE 26/30 . Failed clock drawing.  Skin: No rash (Nonspecific erythematous rash in both lower legs.) noted. No erythema. No pallor.  Left shin scabbed wound. Scaling and peeling of both lower legs.  Psychiatric:  Reticent. Stares, but does not always respond.    Labs reviewed: Basic Metabolic Panel:  Recent Labs  01/21/16 1815 01/22/16 0532 01/27/16 03/17/16 0907 03/18/16 0847  NA  --  137 138 139 137  K  --  4.2 4.1 3.3* 4.0  CL  --  103  --  102 102  CO2  --  27  --  29 26  GLUCOSE  --  85  --  97 110*  BUN  --  25* 19 17 24*  CREATININE  --  0.96 0.8 0.62 1.06*  CALCIUM  --  8.6*  --  9.1 9.0  MG 1.9  --   --   --   --    Liver Function Tests:  Recent Labs  01/21/16 1412 03/17/16 0907  AST 19 18  ALT 16 13*  ALKPHOS 62 59  BILITOT 0.8 1.1  PROT 6.4* 6.9  ALBUMIN 3.4* 4.2   No results for input(s): LIPASE, AMYLASE in the last 8760 hours. No results for input(s): AMMONIA in the last 8760 hours. CBC:  Recent Labs  11/10/15 1452 01/21/16 1412 01/22/16 0532 01/27/16 03/17/16 0907 03/18/16 0431  WBC 8.1 10.4 7.5 5.8 10.0 11.5*  NEUTROABS 5.8 8.0*  --    --  6.8  --  HGB 13.1 12.0 11.9* 11.3* 13.8 13.1  HCT 38.5 35.5* 35.3* 34* 40.5 38.5  MCV 98.2 98.3 97.8  --  96.4 96.3  PLT 343 363 327 340 337 355   Cardiac Enzymes:  Recent Labs  01/21/16 1815 01/21/16 2202 01/22/16 0532  TROPONINI <0.03 <0.03 <0.03   BNP: Invalid input(s): POCBNP Lab Results  Component Value Date   HGBA1C 5.6 12/11/2014   Lab Results  Component Value Date   TSH 3.135 01/22/2016   Lab Results  Component Value Date   [id number] 01/21/2016   No results found for: FOLATE No results found for: IRON, TIBC, FERRITIN  Imaging and Procedures obtained prior to SNF admission: Dg Chest 2 View  Result Date: 03/17/2016 CLINICAL DATA:  Witnessed fall last night. EXAM: CHEST  2 VIEW COMPARISON:  01/21/2016 FINDINGS: Frontal view degraded by motion. There is left ventricular prominence. There is chronic aortic atherosclerosis. Right lung is clear. There is abnormal density in the left lower lobe that could be atelectasis and/or pneumonia. Compared to the previous study, there is slight further loss of height of a lower thoracic vertebral body. Old augmented compression fracture down lower appears unchanged. IMPRESSION: Left lower lobe atelectasis and/or pneumonia. Slight compression fracture in the mid to lower thoracic spine. Electronically Signed   By: Nelson Chimes M.D.   On: 03/17/2016 09:34   Ct Head Wo Contrast  Result Date: 03/17/2016 CLINICAL DATA:  Pain following fall.  Underlying dementia EXAM: CT HEAD WITHOUT CONTRAST CT CERVICAL SPINE WITHOUT CONTRAST TECHNIQUE: Multidetector CT imaging of the head and cervical spine was performed following the standard protocol without intravenous contrast. Multiplanar CT image reconstructions of the cervical spine were also generated. COMPARISON:  CT head January 21, 2016; CT cervical spine November 10, 2015 FINDINGS: CT HEAD FINDINGS Brain: Moderate diffuse atrophy is stable. There is no evident intracranial mass,  hemorrhage, extra-axial fluid collection, or midline shift. There is small vessel disease throughout the centra semiovale bilaterally, stable. No new gray-white compartment lesion is evident. No acute infarct appreciable. Calcification along the tentorium bilaterally is stable. Vascular: No hyper dense vessels. There is extensive carotid siphon calcification bilaterally as well as calcification in both distal vertebral arteries. Skull: The bony calvarium is intact. Sinuses/Orbits: There is opacification in a posterior right ethmoid air cell. There is slight mucosal thickening in several ethmoid air cells elsewhere. Other visualized paranasal sinuses are clear. Visualized orbits appear symmetric bilaterally. Note that the patient has had cataract removal on the right, stable. Other: Mastoid air cells are clear CT CERVICAL SPINE FINDINGS Alignment: There is cervical dextroscoliosis. There is just over 3 mm of anterolisthesis of C3 on C4, slightly increased from prior study. There is just under 2 mm of anterolisthesis of C4 on C5, stable. No spondylolisthesis is evident elsewhere. Skull base and vertebrae: Skull base and craniocervical junction regions appear normal. There is moderate pannus posterior to the odontoid which is not causing appreciable impression on the craniocervical junction. There is no evident fracture. There are no blastic or lytic bone lesions. Soft tissues and spinal canal: The prevertebral soft tissues and predental space regions are normal. No paraspinous lesions are evident. There is no cord or canal hematoma evident. No spinal stenosis appreciable. Disc levels: There is marked disc space narrowing at C4-5. There is moderately severe disc space narrowing at C5-6 and C6-7. There is moderate disc space narrowing at C3-4 and C7-T1. There is facet hypertrophy at most levels bilaterally. There is no frank disc extrusion.  Upper chest: There is aortic atherosclerosis. There is mild scarring in the lung  apices superiorly. Other: There is calcification in both carotid arteries. There are multiple small nodular lesions in the thyroid without dominant thyroid mass evident. IMPRESSION: CT head: Stable atrophy with extensive supratentorial small vessel disease. No acute infarct evident. No mass, hemorrhage, or extra-axial fluid collection. Multiple foci of arteriovascular calcification present. There is ethmoid sinus disease, most notably in a posterior right ethmoid air cell, where there is opacification of much of this air cell. CT cervical spine: No fracture. Spondylolisthesis at C3-4 and C4-5. Note that there is slightly more anterolisthesis at C3-4 than on prior study. Spondylolisthesis at C4-5 stable. The spondylolisthesis at these levels is felt to be due to spondylosis underlying. There is multifocal arthropathy. No frank disc extrusion or high-grade stenosis. Aortic atherosclerosis. Bilateral carotid artery calcification. Multinodular goiter without dominant thyroid mass. Electronically Signed   By: Lowella Grip III M.D.   On: 03/17/2016 09:44   Ct Chest W Contrast  Result Date: 03/17/2016 CLINICAL DATA:  Patient status post fall. Evaluate for traumatic injury. EXAM: CT CHEST, ABDOMEN, AND PELVIS WITH CONTRAST TECHNIQUE: Multidetector CT imaging of the chest, abdomen and pelvis was performed following the standard protocol during bolus administration of intravenous contrast. CONTRAST:  63mL ISOVUE-300 IOPAMIDOL (ISOVUE-300) INJECTION 61% COMPARISON:  MR pelvis 01/04/2010 FINDINGS: CT CHEST FINDINGS Cardiovascular: Heart is enlarged. No pericardial effusion. Peripheral atherosclerotic plaque involving the thoracic aorta. 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 The aorta measures up to 3.4 cm at this location (image 27; series 2). Mediastinum/Nodes: No enlarged axillary, mediastinal or hilar lymphadenopathy. Lungs/Pleura: Central airways are patent. Dependent atelectasis  within the bilateral lower lobes. Calcified granuloma within the left upper lobe. 4 mm right middle lobe nodule (image 74; series 4). No pleural effusion or pneumothorax. Musculoskeletal: Mildly displaced fracture through the anterior left second and third ribs (image 47; series 4) (image 70; series 4). Nondisplaced posterior left ninth, twelfth and eleventh rib fractures. Chronic appearing T12 compression deformity with kyphoplasty material. Narrowing of the spinal canal at this level. Nondisplaced right tenth rib fracture. CT ABDOMEN PELVIS FINDINGS Hepatobiliary: Liver is normal in size and contour. Gallbladder is unremarkable. No intrahepatic or extrahepatic biliary ductal dilatation. Pancreas: Unremarkable Spleen: Unremarkable Adrenals/Urinary Tract: The adrenal glands are normal. Kidneys enhance symmetrically with contrast. There is a 1.7 x 1.8 cm exophytic lesion off the interpolar region of the left kidney within internal density of 49 Hounsfield units (image 58; series 2). Urinary bladder is dilated. Stomach/Bowel: Pancolonic diverticulosis. No CT evidence for acute diverticulitis. No free fluid or free intraperitoneal air. No evidence for bowel obstruction. Vascular/Lymphatic: Peripheral calcified atherosclerotic plaque involving the abdominal aorta. No retroperitoneal lymphadenopathy. Reproductive: There is a large partially calcified right adnexal mass and associated cyst. Cyst measures up to 4 cm stone in dimension. Sizes this mass appears similar to prior MRI 01/04/2010. Other: None. Musculoskeletal: Old left inferior pubic ramus fracture. Osseous demineralization. Lumbar spine degenerative changes. No aggressive or acute appearing osseous lesions. IMPRESSION: Left anterior second and third mildly displaced rib fractures. Nondisplaced posterior left ninth, eleventh and twelfth rib fractures. Nondisplaced right tenth rib fracture. Chronic appearing T12 compression deformity with kyphoplasty material.  There is narrowing of the spinal canal at this level. Otherwise no acute process within the chest, abdomen or pelvis. Indeterminate dense exophytic mass off the interpolar region of the left kidney. This may represent a complicated cyst or solid renal mass. Recommend  dedicated evaluation with pre and post contrast-enhanced MRI or CT in the non acute setting. 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 Given the patient's age, this is likely of doubtful clinical significance however vascular consultation can be performed as clinically indicated. Indeterminate calcified and cystic mass within the right adnexa. Grossly this appears similar in size when compared to prior MRI exams. This is nonspecific in etiology however may be benign given stability over time. Pancolonic diverticulosis. Electronically Signed   By: Lovey Newcomer M.D.   On: 03/17/2016 11:34   Ct Cervical Spine Wo Contrast  Result Date: 03/17/2016 CLINICAL DATA:  Pain following fall.  Underlying dementia EXAM: CT HEAD WITHOUT CONTRAST CT CERVICAL SPINE WITHOUT CONTRAST TECHNIQUE: Multidetector CT imaging of the head and cervical spine was performed following the standard protocol without intravenous contrast. Multiplanar CT image reconstructions of the cervical spine were also generated. COMPARISON:  CT head January 21, 2016; CT cervical spine November 10, 2015 FINDINGS: CT HEAD FINDINGS Brain: Moderate diffuse atrophy is stable. There is no evident intracranial mass, hemorrhage, extra-axial fluid collection, or midline shift. There is small vessel disease throughout the centra semiovale bilaterally, stable. No new gray-white compartment lesion is evident. No acute infarct appreciable. Calcification along the tentorium bilaterally is stable. Vascular: No hyper dense vessels. There is extensive carotid siphon calcification bilaterally as well as calcification in both distal vertebral  arteries. Skull: The bony calvarium is intact. Sinuses/Orbits: There is opacification in a posterior right ethmoid air cell. There is slight mucosal thickening in several ethmoid air cells elsewhere. Other visualized paranasal sinuses are clear. Visualized orbits appear symmetric bilaterally. Note that the patient has had cataract removal on the right, stable. Other: Mastoid air cells are clear CT CERVICAL SPINE FINDINGS Alignment: There is cervical dextroscoliosis. There is just over 3 mm of anterolisthesis of C3 on C4, slightly increased from prior study. There is just under 2 mm of anterolisthesis of C4 on C5, stable. No spondylolisthesis is evident elsewhere. Skull base and vertebrae: Skull base and craniocervical junction regions appear normal. There is moderate pannus posterior to the odontoid which is not causing appreciable impression on the craniocervical junction. There is no evident fracture. There are no blastic or lytic bone lesions. Soft tissues and spinal canal: The prevertebral soft tissues and predental space regions are normal. No paraspinous lesions are evident. There is no cord or canal hematoma evident. No spinal stenosis appreciable. Disc levels: There is marked disc space narrowing at C4-5. There is moderately severe disc space narrowing at C5-6 and C6-7. There is moderate disc space narrowing at C3-4 and C7-T1. There is facet hypertrophy at most levels bilaterally. There is no frank disc extrusion. Upper chest: There is aortic atherosclerosis. There is mild scarring in the lung apices superiorly. Other: There is calcification in both carotid arteries. There are multiple small nodular lesions in the thyroid without dominant thyroid mass evident. IMPRESSION: CT head: Stable atrophy with extensive supratentorial small vessel disease. No acute infarct evident. No mass, hemorrhage, or extra-axial fluid collection. Multiple foci of arteriovascular calcification present. There is ethmoid sinus  disease, most notably in a posterior right ethmoid air cell, where there is opacification of much of this air cell. CT cervical spine: No fracture. Spondylolisthesis at C3-4 and C4-5. Note that there is slightly more anterolisthesis at C3-4 than on prior study. Spondylolisthesis at C4-5 stable. The spondylolisthesis at these levels is felt to be due to spondylosis underlying. There is multifocal  arthropathy. No frank disc extrusion or high-grade stenosis. Aortic atherosclerosis. Bilateral carotid artery calcification. Multinodular goiter without dominant thyroid mass. Electronically Signed   By: Lowella Grip III M.D.   On: 03/17/2016 09:44   Ct Thoracic Spine Wo Contrast  Result Date: 03/17/2016 CLINICAL DATA:  Back pain, fell last night, dementia EXAM: CT THORACIC AND LUMBAR SPINE WITHOUT CONTRAST TECHNIQUE: Multidetector CT imaging of the thoracic and lumbar spine was performed without contrast. Multiplanar CT image reconstructions were also generated. COMPARISON:  Chest radiograph 01/21/2016, MR pelvis 01/04/2010 FINDINGS: CT THORACIC SPINE FINDINGS Alignment: 12 rib pairs. Mild dextroconvex thoracic scoliosis. Otherwise normal. Vertebrae: Marked osseous demineralization. Chronic marked compression fracture of T12 vertebral body with retropulsion of a posterior fragment, significant AP narrowing of the spinal canal, and evidence of spinal augmentation procedure. Remaining vertebral body heights maintained. Few scattered endplate spurs and mild disc space narrowing at mid thoracic levels. No acute thoracic spine fracture or subluxation Paraspinal and other soft tissues: Extensive atherosclerotic calcification aorta with aneurysmal dilatation of the descending thoracic aorta to 3.2 x 3.5 cm image 68. Coronary arterial calcifications. Heart appears enlarged. No definite thoracic adenopathy. Dependent atelectasis in both lungs. Calcified granuloma LEFT upper lobe. Disc levels: Grossly unremarkable. CT LUMBAR  SPINE FINDINGS Segmentation: 5 non-rib-bearing lumbar type vertebra. Alignment: Normal Vertebrae: Marked osseous demineralization. Vertebral body heights maintained without acute fracture or subluxation. Multilevel facet degenerative changes. SI joints appear symmetric and preserved. No obvious sacral fracture. Paraspinal and other soft tissues: Extensive atherosclerotic calcifications abdominal aorta and iliac arteries. Partially calcified mass in the RIGHT pelvis, 6.9 x 5.3 x 8.2 cm, uncertain if arising from uterus or adnexa diet present since and grossly unchanged since an MRI of the pelvis from 2011. Disc levels: Diffusely narrowed.  No gross disc herniation. IMPRESSION: CT THORACIC SPINE IMPRESSION Marked osseous demineralization. Old T12 compression fracture and vertebroplasty with stable retropulsion of a dominant fragment and AP narrowing of the spinal canal. No acute thoracic spine abnormalities. CT LUMBAR SPINE IMPRESSION Osseous demineralization with degenerative disc and facet disease changes lumbar spine. No acute lumbar spine abnormalities. Partially calcified 6.9 x 5.3 x 8.2 cm diameter mass within the RIGHT pelvis, present since 2011, either of RIGHT ovarian or less likely uterine origin, favor benign in light of time this has been present and similar in appearance. Extensive aortic atherosclerosis with mild aneurysmal dilatation of the descending thoracic aorta up to 3.2 x 3.5 cm. Coronary arterial calcifications. Electronically Signed   By: Lavonia Dana M.D.   On: 03/17/2016 11:51   Ct Lumbar Spine Wo Contrast  Result Date: 03/17/2016 CLINICAL DATA:  Back pain, fell last night, dementia EXAM: CT THORACIC AND LUMBAR SPINE WITHOUT CONTRAST TECHNIQUE: Multidetector CT imaging of the thoracic and lumbar spine was performed without contrast. Multiplanar CT image reconstructions were also generated. COMPARISON:  Chest radiograph 01/21/2016, MR pelvis 01/04/2010 FINDINGS: CT THORACIC SPINE FINDINGS  Alignment: 12 rib pairs. Mild dextroconvex thoracic scoliosis. Otherwise normal. Vertebrae: Marked osseous demineralization. Chronic marked compression fracture of T12 vertebral body with retropulsion of a posterior fragment, significant AP narrowing of the spinal canal, and evidence of spinal augmentation procedure. Remaining vertebral body heights maintained. Few scattered endplate spurs and mild disc space narrowing at mid thoracic levels. No acute thoracic spine fracture or subluxation Paraspinal and other soft tissues: Extensive atherosclerotic calcification aorta with aneurysmal dilatation of the descending thoracic aorta to 3.2 x 3.5 cm image 68. Coronary arterial calcifications. Heart appears enlarged. No definite thoracic adenopathy. Dependent atelectasis  in both lungs. Calcified granuloma LEFT upper lobe. Disc levels: Grossly unremarkable. CT LUMBAR SPINE FINDINGS Segmentation: 5 non-rib-bearing lumbar type vertebra. Alignment: Normal Vertebrae: Marked osseous demineralization. Vertebral body heights maintained without acute fracture or subluxation. Multilevel facet degenerative changes. SI joints appear symmetric and preserved. No obvious sacral fracture. Paraspinal and other soft tissues: Extensive atherosclerotic calcifications abdominal aorta and iliac arteries. Partially calcified mass in the RIGHT pelvis, 6.9 x 5.3 x 8.2 cm, uncertain if arising from uterus or adnexa diet present since and grossly unchanged since an MRI of the pelvis from 2011. Disc levels: Diffusely narrowed.  No gross disc herniation. IMPRESSION: CT THORACIC SPINE IMPRESSION Marked osseous demineralization. Old T12 compression fracture and vertebroplasty with stable retropulsion of a dominant fragment and AP narrowing of the spinal canal. No acute thoracic spine abnormalities. CT LUMBAR SPINE IMPRESSION Osseous demineralization with degenerative disc and facet disease changes lumbar spine. No acute lumbar spine abnormalities.  Partially calcified 6.9 x 5.3 x 8.2 cm diameter mass within the RIGHT pelvis, present since 2011, either of RIGHT ovarian or less likely uterine origin, favor benign in light of time this has been present and similar in appearance. Extensive aortic atherosclerosis with mild aneurysmal dilatation of the descending thoracic aorta up to 3.2 x 3.5 cm. Coronary arterial calcifications. Electronically Signed   By: Lavonia Dana M.D.   On: 03/17/2016 11:51   Ct Abdomen Pelvis W Contrast  Result Date: 03/17/2016 CLINICAL DATA:  Patient status post fall. Evaluate for traumatic injury. EXAM: CT CHEST, ABDOMEN, AND PELVIS WITH CONTRAST TECHNIQUE: Multidetector CT imaging of the chest, abdomen and pelvis was performed following the standard protocol during bolus administration of intravenous contrast. CONTRAST:  35mL ISOVUE-300 IOPAMIDOL (ISOVUE-300) INJECTION 61% COMPARISON:  MR pelvis 01/04/2010 FINDINGS: CT CHEST FINDINGS Cardiovascular: Heart is enlarged. No pericardial effusion. Peripheral atherosclerotic plaque involving the thoracic aorta. 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 The aorta measures up to 3.4 cm at this location (image 27; series 2). Mediastinum/Nodes: No enlarged axillary, mediastinal or hilar lymphadenopathy. Lungs/Pleura: Central airways are patent. Dependent atelectasis within the bilateral lower lobes. Calcified granuloma within the left upper lobe. 4 mm right middle lobe nodule (image 74; series 4). No pleural effusion or pneumothorax. Musculoskeletal: Mildly displaced fracture through the anterior left second and third ribs (image 47; series 4) (image 70; series 4). Nondisplaced posterior left ninth, twelfth and eleventh rib fractures. Chronic appearing T12 compression deformity with kyphoplasty material. Narrowing of the spinal canal at this level. Nondisplaced right tenth rib fracture. CT ABDOMEN PELVIS FINDINGS Hepatobiliary: Liver is normal in size and contour.  Gallbladder is unremarkable. No intrahepatic or extrahepatic biliary ductal dilatation. Pancreas: Unremarkable Spleen: Unremarkable Adrenals/Urinary Tract: The adrenal glands are normal. Kidneys enhance symmetrically with contrast. There is a 1.7 x 1.8 cm exophytic lesion off the interpolar region of the left kidney within internal density of 49 Hounsfield units (image 58; series 2). Urinary bladder is dilated. Stomach/Bowel: Pancolonic diverticulosis. No CT evidence for acute diverticulitis. No free fluid or free intraperitoneal air. No evidence for bowel obstruction. Vascular/Lymphatic: Peripheral calcified atherosclerotic plaque involving the abdominal aorta. No retroperitoneal lymphadenopathy. Reproductive: There is a large partially calcified right adnexal mass and associated cyst. Cyst measures up to 4 cm stone in dimension. Sizes this mass appears similar to prior MRI 01/04/2010. Other: None. Musculoskeletal: Old left inferior pubic ramus fracture. Osseous demineralization. Lumbar spine degenerative changes. No aggressive or acute appearing osseous lesions. IMPRESSION: Left anterior second and third mildly  displaced rib fractures. Nondisplaced posterior left ninth, eleventh and twelfth rib fractures. Nondisplaced right tenth rib fracture. Chronic appearing T12 compression deformity with kyphoplasty material. There is narrowing of the spinal canal at this level. Otherwise no acute process within the chest, abdomen or pelvis. Indeterminate dense exophytic mass off the interpolar region of the left kidney. This may represent a complicated cyst or solid renal mass. Recommend dedicated evaluation with pre and post contrast-enhanced MRI or CT in the non acute setting. 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 Given the patient's age, this is likely of doubtful clinical significance however vascular consultation can be performed as clinically  indicated. Indeterminate calcified and cystic mass within the right adnexa. Grossly this appears similar in size when compared to prior MRI exams. This is nonspecific in etiology however may be benign given stability over time. Pancolonic diverticulosis. Electronically Signed   By: Lovey Newcomer M.D.   On: 03/17/2016 11:34   Dg Knee Complete 4 Views Right  Result Date: 03/17/2016 CLINICAL DATA:  Witnessed fall last night.  Pain. EXAM: RIGHT KNEE - COMPLETE 4+ VIEW COMPARISON:  None. FINDINGS: Complete transverse fracture of the patella, displaced a few mm with slight offset. No fracture of the femur, tibia or fibula. Small lipoma hemarthrosis. IMPRESSION: Complete transverse fracture of the mid patella. Electronically Signed   By: Nelson Chimes M.D.   On: 03/17/2016 09:37   Dg Hip Unilat With Pelvis 2-3 Views Right  Result Date: 03/17/2016 CLINICAL DATA:  Witnessed fall last night. EXAM: DG HIP (WITH OR WITHOUT PELVIS) 2-3V RIGHT COMPARISON:  None. FINDINGS: No evidence of pelvic or hip fracture. Extensive pelvic calcifications presumed represent chronic benign uterine disease. Old healed rami fractures on the left. IMPRESSION: No acute finding. Electronically Signed   By: Nelson Chimes M.D.   On: 03/17/2016 09:35   Dg Femur, Min 2 Views Right  Result Date: 03/17/2016 CLINICAL DATA:  Witnessed fall last night.  Pain. EXAM: RIGHT FEMUR 2 VIEWS COMPARISON:  None. FINDINGS: There is no evidence of fracture or other focal bone lesions. Soft tissues are unremarkable. IMPRESSION: Negative. Electronically Signed   By: Nelson Chimes M.D.   On: 03/17/2016 09:36    Assessment/Plan  1. Closed sleeve fracture of left patella, initial encounter Follow up with ortho to see if surgery is needed. PT and OT consulted.  2. Closed fracture of multiple ribs of left side with routine healing, subsequent encounter should heal. Lung sounds are normal today  3. Fall, subsequent encounter Unstable gait  4. 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 Trigger finger, acquired See ortho as needed  6. Essential hypertension controlled  7. Hallucinations Controlled on risperidone  8. Moderate single current episode of major depressive disorder ([place]) Controlled with Celaxa

## 2016-03-23 DIAGNOSIS — R2681 Unsteadiness on feet: Secondary | ICD-10-CM | POA: Diagnosis not present

## 2016-03-23 DIAGNOSIS — R278 Other lack of coordination: Secondary | ICD-10-CM | POA: Diagnosis not present

## 2016-03-23 DIAGNOSIS — R262 Difficulty in walking, not elsewhere classified: Secondary | ICD-10-CM | POA: Diagnosis not present

## 2016-03-23 DIAGNOSIS — M6281 Muscle weakness (generalized): Secondary | ICD-10-CM | POA: Diagnosis not present

## 2016-03-23 DIAGNOSIS — M20001 Unspecified deformity of right finger(s): Secondary | ICD-10-CM | POA: Diagnosis not present

## 2016-03-24 ENCOUNTER — Non-Acute Institutional Stay (SKILLED_NURSING_FACILITY): Payer: Medicare Other | Admitting: Nurse Practitioner

## 2016-03-24 ENCOUNTER — Encounter: Payer: Self-pay | Admitting: Nurse Practitioner

## 2016-03-24 DIAGNOSIS — I10 Essential (primary) hypertension: Secondary | ICD-10-CM

## 2016-03-24 DIAGNOSIS — R2681 Unsteadiness on feet: Secondary | ICD-10-CM | POA: Diagnosis not present

## 2016-03-24 DIAGNOSIS — F0390 Unspecified dementia without behavioral disturbance: Secondary | ICD-10-CM

## 2016-03-24 DIAGNOSIS — S2242XD Multiple fractures of ribs, left side, subsequent encounter for fracture with routine healing: Secondary | ICD-10-CM

## 2016-03-24 DIAGNOSIS — E876 Hypokalemia: Secondary | ICD-10-CM | POA: Diagnosis not present

## 2016-03-24 DIAGNOSIS — K5900 Constipation, unspecified: Secondary | ICD-10-CM | POA: Diagnosis not present

## 2016-03-24 DIAGNOSIS — M20001 Unspecified deformity of right finger(s): Secondary | ICD-10-CM | POA: Diagnosis not present

## 2016-03-24 DIAGNOSIS — F321 Major depressive disorder, single episode, moderate: Secondary | ICD-10-CM | POA: Diagnosis not present

## 2016-03-24 DIAGNOSIS — E538 Deficiency of other specified B group vitamins: Secondary | ICD-10-CM | POA: Diagnosis not present

## 2016-03-24 DIAGNOSIS — R443 Hallucinations, unspecified: Secondary | ICD-10-CM

## 2016-03-24 DIAGNOSIS — M6281 Muscle weakness (generalized): Secondary | ICD-10-CM | POA: Diagnosis not present

## 2016-03-24 DIAGNOSIS — S82001D Unspecified fracture of right patella, subsequent encounter for closed fracture with routine healing: Secondary | ICD-10-CM | POA: Diagnosis not present

## 2016-03-24 DIAGNOSIS — R609 Edema, unspecified: Secondary | ICD-10-CM

## 2016-03-24 DIAGNOSIS — R262 Difficulty in walking, not elsewhere classified: Secondary | ICD-10-CM | POA: Diagnosis not present

## 2016-03-24 DIAGNOSIS — R278 Other lack of coordination: Secondary | ICD-10-CM | POA: Diagnosis not present

## 2016-03-24 DIAGNOSIS — E039 Hypothyroidism, unspecified: Secondary | ICD-10-CM

## 2016-03-24 NOTE — Assessment & Plan Note (Signed)
Her mood is stable, continue Risperdal 0.5mg,  Celexa 10mg.  

## 2016-03-24 NOTE — Assessment & Plan Note (Signed)
03/18/16 Na 137, K 4.0, Bun 24, creat 1.06 

## 2016-03-24 NOTE — Assessment & Plan Note (Signed)
Trace BLE 

## 2016-03-24 NOTE — Assessment & Plan Note (Signed)
Knee immobilizer.  

## 2016-03-24 NOTE — Assessment & Plan Note (Signed)
Stable, continue Risperdal.  °

## 2016-03-24 NOTE — Progress Notes (Signed)
Location:  [facility] Room Number: 12 Place of Service:  SNF (31) Provider:  Tove Wideman, Manxie  NP  Jeanmarie Hubert, MD  Patient Care Team: Estill Dooms, MD as PCP - General (Internal Medicine) [facility] Gaynelle Arabian, MD as Consulting Physician (Orthopedic Surgery) Devyon Keator Otho Darner, NP as Nurse Practitioner (Internal Medicine)  Extended Emergency Contact Information Primary Emergency Contact: Peri Jefferson [place] of [place] Mobile Phone: [phone] Relation: Daughter Secondary Emergency Contact: Kerby Less [place] Phone: [phone] Relation: Daughter  Code Status:  DNR Goals of care: Advanced Directive information Advanced Directives 03/24/2016  Does Patient Have a Medical Advance Directive? Yes  Type of Paramedic of [place];Out of facility DNR (pink MOST or yellow form);Living will  Does patient want to make changes to medical advance directive? No - Patient declined  Copy of [place] in Chart? Yes  Pre-existing out of facility DNR order (yellow form or pink MOST form) Yellow form placed in chart (order not valid for inpatient use)     Chief Complaint  Patient presents with  . Acute Visit    pain mangement    HPI:  Pt is a 83 y.o. female seen today for an acute visit for pain in the right knee and rib fxs  hospitalization 03/17/16 to 03/19/16. She had a fall and sustained patellar fx of the right knee and rib fractures (left 2 and 3; left 9,11and 12; right 10). Prn Tylenol, Norco, Ibuprofen are available to her for pain.   Hx of hallucination, insomnia, anxiety, stable while on Risperdal 0.5mg  qd, Celexa 10mg , HTN, taking Metoprolol 12.5mg  bidl. Chronic edema BLE is minimal, off Furosemide 20mg . TSH 5.99 12/13/15, started Levothyroxine 64mcg 12/14/15, f/u TSH scheduled.  Past Medical History:  Diagnosis Date  . Abnormality of gait 03/15/2010  . Anxiety state, unspecified  03/15/2010  . Depression, major 06/15/2015  . Depressive disorder, not elsewhere classified 03/15/2010  . Dysphagia, unspecified(787.20) 03/15/2010  . Generalized anxiety disorder 06/23/2014  . Herpes zoster without mention of complication [id number]  . Other alteration of consciousness 03/15/2010  . Pain in hand 08/13/12  . Personal history of fall 03/15/2010  . Senile osteoporosis 03/15/2010  . Unspecified constipation 03/15/2010  . Unspecified essential hypertension 03/15/2010  . Unspecified hearing loss 08/22/2011  . Unspecified urinary incontinence 03/15/2010  . Urinary incontinence 03/15/2010  . Vaginal prolapse 06/15/2015   Past Surgical History:  Procedure Laterality Date  . INCISION / DRAINAGE HAND / FINGER Right 06/2008   hand  . SPINE SURGERY  12/2006    Allergies  Allergen Reactions  . Ace Inhibitors Other (See Comments)    Reaction unknown/ listed on MAR  . Hydrochlorothiazide Other (See Comments)    Reaction unknown/ listed on MAR  . Morphine And Related Other (See Comments)    Hallucinations    Allergies as of 03/24/2016      Reactions   Ace Inhibitors Other (See Comments)   Reaction unknown/ listed on MAR   Hydrochlorothiazide Other (See Comments)   Reaction unknown/ listed on MAR   Morphine And Related Other (See Comments)   Hallucinations      Medication List       Accurate as of 03/24/16 11:59 PM. Always use your most recent med list.          acetaminophen 325 MG tablet Commonly known as:  TYLENOL Take 2 tablets (650 mg total) by mouth every 6 (six) hours as  needed for mild pain (or Fever >/= 101).   aspirin EC 81 MG tablet Take 1 tablet (81 mg total) by mouth daily.   CALCIUM 600+D 600-200 MG-UNIT Tabs Generic drug:  Calcium Carbonate-Vitamin D Take 1 tablet by mouth daily.   citalopram 10 MG tablet Commonly known as:  CELEXA Take one table once daily   cyanocobalamin 1000 MCG tablet Take 1,000 mcg by mouth. Take one tablet once daily for Vitamin  B-12   HYDROcodone-acetaminophen 5-325 MG tablet Commonly known as:  NORCO/VICODIN Take 1 tablet by mouth every 6 (six) hours as needed for moderate pain.   ibuprofen 200 MG tablet Commonly known as:  ADVIL,MOTRIN Take 400 mg by mouth every 6 (six) hours as needed (for pain).   levothyroxine 25 MCG tablet Commonly known as:  SYNTHROID, LEVOTHROID Take 25 mcg by mouth daily before breakfast.   metoprolol tartrate 25 MG tablet Commonly known as:  LOPRESSOR Take 25 mg by mouth. Take 1/2 tablet (12.5 mg) twice a day   mineral oil-hydrophilic petrolatum ointment Apply 1 application topically daily as needed for dry skin.   polyethylene glycol packet Commonly known as:  MIRALAX / GLYCOLAX Take 17 g by mouth daily.   PROCTO-MED HC 2.5 % rectal cream Generic drug:  hydrocortisone Place 1 application rectally every 12 (twelve) hours as needed for hemorrhoids or itching.   risperiDONE 0.5 MG tablet Commonly known as:  RISPERDAL Take 0.5 mg by mouth. Take one tablet at bedtime       Review of Systems  Constitutional: Negative for activity change, appetite change and fatigue.  HENT: Positive for hearing loss. Negative for ear pain.   Eyes: Negative.   Respiratory: Negative.  Negative for shortness of breath.   Cardiovascular: Positive for leg swelling. Negative for chest pain and palpitations.       HTN  Gastrointestinal: Negative.   Endocrine: Negative.   Genitourinary:       Incontinence  Musculoskeletal: Positive for arthralgias (in feet).       Pain in both hands: R>L. Trigger fingers at roght 3rd and 4th. Contracted into palm most of the time. Right patellar fracture. Right leg in long leg Velcro closure splint.  Skin: Negative for rash (Erythematous rash of the lower legs nonspecific nature).  Neurological: Negative.   Hematological: Negative.   Psychiatric/Behavioral: Positive for confusion and sleep disturbance (Wakes at 3 AM. Full of energy then. Uses Motrin PM to  get back to sleep. ). The patient is nervous/anxious.        Hx of hallucinations.    Immunization History  Administered Date(s) Administered  . Influenza Whole 10/24/2011, 10/24/2012  . Influenza-Unspecified 11/06/2013, 11/04/2015  . PPD Test 01/11/2015  . Pneumococcal Polysaccharide-23 01/23/2009  . Td 01/23/2009   Pertinent  Health Maintenance Due  Topic Date Due  . DEXA SCAN  07/26/1984  . PNA vac Low Risk Adult (2 of 2 - PCV13) 01/23/2010  . INFLUENZA VACCINE  Completed   Fall Risk  03/20/2016 09/21/2015 06/15/2015 01/05/2015 12/29/2014  Falls in the past year? Yes No No No No  Number falls in past yr: - - - - -  Injury with Fall? Yes - - - -  Risk Factor Category  High Fall Risk - - - -   Functional Status Survey:    Vitals:   03/24/16 1520  BP: (!) 157/72  Pulse: (!) 52  Resp: 12  Temp: 97.3 F (36.3 C)  SpO2: 91%  Weight: 114 lb 9.6 oz (  52 kg)  Height: 5\' 2"  (1.575 m)   Body mass index is 20.96 kg/m. Physical Exam  Constitutional: She appears well-developed and well-nourished. No distress.  HENT:  Head: Normocephalic and atraumatic.  Severe hearing loss bilaterally. Wearing hearing aids.  Eyes: Conjunctivae and EOM are normal. Pupils are equal, round, and reactive to light.  Neck: Normal range of motion. Neck supple. No JVD present. No tracheal deviation present. No thyromegaly present.  Cardiovascular: Normal rate, regular rhythm and normal heart sounds.  Exam reveals no gallop and no friction rub.   No murmur heard. Pulmonary/Chest: No respiratory distress. She has no wheezes. She has no rales. She exhibits no tenderness.  Abdominal: Bowel sounds are normal. She exhibits no distension and no mass. There is no tenderness.  Genitourinary: Rectal exam shows guaiac negative stool.  Genitourinary Comments: Examined 06/15/15: Firm area at the top of the vagina. Prolapse of the vaginal walls. Loss of rectal sphincter tone.  Musculoskeletal: Normal range of motion.  She exhibits edema. She exhibits no tenderness.  Pain in hands. Right 3rd and 4th trigger finges with contracturesr. Uses walker due to unstable gait. Left 2nd toe is deviated to the great toe and has elevated DIP joint that rubs on her shoe. Trace edema R+L ankles Right leg in long leg splint with Velcro closure.  Lymphadenopathy:    She has no cervical adenopathy.  Neurological: She is alert. No cranial nerve deficit. Coordination normal.  12/29/14 MMSE 26/30 . Failed clock drawing.  Skin: No rash (Nonspecific erythematous rash in both lower legs.) noted. No erythema. No pallor.  Left shin scabbed wound. Scaling and peeling of both lower legs.  Psychiatric:  Reticent. Stares, but does not always respond.    Labs reviewed:  Recent Labs  01/21/16 1815 01/22/16 0532 01/27/16 03/17/16 0907 03/18/16 0847  NA  --  137 138 139 137  K  --  4.2 4.1 3.3* 4.0  CL  --  103  --  102 102  CO2  --  27  --  29 26  GLUCOSE  --  85  --  97 110*  BUN  --  25* 19 17 24*  CREATININE  --  0.96 0.8 0.62 1.06*  CALCIUM  --  8.6*  --  9.1 9.0  MG 1.9  --   --   --   --     Recent Labs  01/21/16 1412 03/17/16 0907  AST 19 18  ALT 16 13*  ALKPHOS 62 59  BILITOT 0.8 1.1  PROT 6.4* 6.9  ALBUMIN 3.4* 4.2    Recent Labs  11/10/15 1452 01/21/16 1412 01/22/16 0532 01/27/16 03/17/16 0907 03/18/16 0431  WBC 8.1 10.4 7.5 5.8 10.0 11.5*  NEUTROABS 5.8 8.0*  --   --  6.8  --   HGB 13.1 12.0 11.9* 11.3* 13.8 13.1  HCT 38.5 35.5* 35.3* 34* 40.5 38.5  MCV 98.2 98.3 97.8  --  96.4 96.3  PLT 343 363 327 340 337 355   Lab Results  Component Value Date   TSH 3.135 01/22/2016   Lab Results  Component Value Date   HGBA1C 5.6 12/11/2014   Lab Results  Component Value Date   CHOL 178 12/11/2014   HDL 60 12/11/2014   LDLCALC 97 12/11/2014   TRIG 106 12/11/2014   CHOLHDL 3.0 12/11/2014    Significant Diagnostic Results in last 30 days:  Dg Chest 2 View  Result Date: 03/17/2016 CLINICAL  DATA:  Witnessed fall last night.  EXAM: CHEST  2 VIEW COMPARISON:  01/21/2016 FINDINGS: Frontal view degraded by motion. There is left ventricular prominence. There is chronic aortic atherosclerosis. Right lung is clear. There is abnormal density in the left lower lobe that could be atelectasis and/or pneumonia. Compared to the previous study, there is slight further loss of height of a lower thoracic vertebral body. Old augmented compression fracture down lower appears unchanged. IMPRESSION: Left lower lobe atelectasis and/or pneumonia. Slight compression fracture in the mid to lower thoracic spine. Electronically Signed   By: Nelson Chimes M.D.   On: 03/17/2016 09:34   Ct Head Wo Contrast  Result Date: 03/17/2016 CLINICAL DATA:  Pain following fall.  Underlying dementia EXAM: CT HEAD WITHOUT CONTRAST CT CERVICAL SPINE WITHOUT CONTRAST TECHNIQUE: Multidetector CT imaging of the head and cervical spine was performed following the standard protocol without intravenous contrast. Multiplanar CT image reconstructions of the cervical spine were also generated. COMPARISON:  CT head January 21, 2016; CT cervical spine November 10, 2015 FINDINGS: CT HEAD FINDINGS Brain: Moderate diffuse atrophy is stable. There is no evident intracranial mass, hemorrhage, extra-axial fluid collection, or midline shift. There is small vessel disease throughout the centra semiovale bilaterally, stable. No new gray-white compartment lesion is evident. No acute infarct appreciable. Calcification along the tentorium bilaterally is stable. Vascular: No hyper dense vessels. There is extensive carotid siphon calcification bilaterally as well as calcification in both distal vertebral arteries. Skull: The bony calvarium is intact. Sinuses/Orbits: There is opacification in a posterior right ethmoid air cell. There is slight mucosal thickening in several ethmoid air cells elsewhere. Other visualized paranasal sinuses are clear. Visualized orbits  appear symmetric bilaterally. Note that the patient has had cataract removal on the right, stable. Other: Mastoid air cells are clear CT CERVICAL SPINE FINDINGS Alignment: There is cervical dextroscoliosis. There is just over 3 mm of anterolisthesis of C3 on C4, slightly increased from prior study. There is just under 2 mm of anterolisthesis of C4 on C5, stable. No spondylolisthesis is evident elsewhere. Skull base and vertebrae: Skull base and craniocervical junction regions appear normal. There is moderate pannus posterior to the odontoid which is not causing appreciable impression on the craniocervical junction. There is no evident fracture. There are no blastic or lytic bone lesions. Soft tissues and spinal canal: The prevertebral soft tissues and predental space regions are normal. No paraspinous lesions are evident. There is no cord or canal hematoma evident. No spinal stenosis appreciable. Disc levels: There is marked disc space narrowing at C4-5. There is moderately severe disc space narrowing at C5-6 and C6-7. There is moderate disc space narrowing at C3-4 and C7-T1. There is facet hypertrophy at most levels bilaterally. There is no frank disc extrusion. Upper chest: There is aortic atherosclerosis. There is mild scarring in the lung apices superiorly. Other: There is calcification in both carotid arteries. There are multiple small nodular lesions in the thyroid without dominant thyroid mass evident. IMPRESSION: CT head: Stable atrophy with extensive supratentorial small vessel disease. No acute infarct evident. No mass, hemorrhage, or extra-axial fluid collection. Multiple foci of arteriovascular calcification present. There is ethmoid sinus disease, most notably in a posterior right ethmoid air cell, where there is opacification of much of this air cell. CT cervical spine: No fracture. Spondylolisthesis at C3-4 and C4-5. Note that there is slightly more anterolisthesis at C3-4 than on prior study.  Spondylolisthesis at C4-5 stable. The spondylolisthesis at these levels is felt to be due to spondylosis underlying. There is multifocal  arthropathy. No frank disc extrusion or high-grade stenosis. Aortic atherosclerosis. Bilateral carotid artery calcification. Multinodular goiter without dominant thyroid mass. Electronically Signed   By: Lowella Grip III M.D.   On: 03/17/2016 09:44   Ct Chest W Contrast  Result Date: 03/17/2016 CLINICAL DATA:  Patient status post fall. Evaluate for traumatic injury. EXAM: CT CHEST, ABDOMEN, AND PELVIS WITH CONTRAST TECHNIQUE: Multidetector CT imaging of the chest, abdomen and pelvis was performed following the standard protocol during bolus administration of intravenous contrast. CONTRAST:  36mL ISOVUE-300 IOPAMIDOL (ISOVUE-300) INJECTION 61% COMPARISON:  MR pelvis 01/04/2010 FINDINGS: CT CHEST FINDINGS Cardiovascular: Heart is enlarged. No pericardial effusion. Peripheral atherosclerotic plaque involving the thoracic aorta. 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 The aorta measures up to 3.4 cm at this location (image 27; series 2). Mediastinum/Nodes: No enlarged axillary, mediastinal or hilar lymphadenopathy. Lungs/Pleura: Central airways are patent. Dependent atelectasis within the bilateral lower lobes. Calcified granuloma within the left upper lobe. 4 mm right middle lobe nodule (image 74; series 4). No pleural effusion or pneumothorax. Musculoskeletal: Mildly displaced fracture through the anterior left second and third ribs (image 47; series 4) (image 70; series 4). Nondisplaced posterior left ninth, twelfth and eleventh rib fractures. Chronic appearing T12 compression deformity with kyphoplasty material. Narrowing of the spinal canal at this level. Nondisplaced right tenth rib fracture. CT ABDOMEN PELVIS FINDINGS Hepatobiliary: Liver is normal in size and contour. Gallbladder is unremarkable. No intrahepatic or extrahepatic biliary  ductal dilatation. Pancreas: Unremarkable Spleen: Unremarkable Adrenals/Urinary Tract: The adrenal glands are normal. Kidneys enhance symmetrically with contrast. There is a 1.7 x 1.8 cm exophytic lesion off the interpolar region of the left kidney within internal density of 49 Hounsfield units (image 58; series 2). Urinary bladder is dilated. Stomach/Bowel: Pancolonic diverticulosis. No CT evidence for acute diverticulitis. No free fluid or free intraperitoneal air. No evidence for bowel obstruction. Vascular/Lymphatic: Peripheral calcified atherosclerotic plaque involving the abdominal aorta. No retroperitoneal lymphadenopathy. Reproductive: There is a large partially calcified right adnexal mass and associated cyst. Cyst measures up to 4 cm stone in dimension. Sizes this mass appears similar to prior MRI 01/04/2010. Other: None. Musculoskeletal: Old left inferior pubic ramus fracture. Osseous demineralization. Lumbar spine degenerative changes. No aggressive or acute appearing osseous lesions. IMPRESSION: Left anterior second and third mildly displaced rib fractures. Nondisplaced posterior left ninth, eleventh and twelfth rib fractures. Nondisplaced right tenth rib fracture. Chronic appearing T12 compression deformity with kyphoplasty material. There is narrowing of the spinal canal at this level. Otherwise no acute process within the chest, abdomen or pelvis. Indeterminate dense exophytic mass off the interpolar region of the left kidney. This may represent a complicated cyst or solid renal mass. Recommend dedicated evaluation with pre and post contrast-enhanced MRI or CT in the non acute setting. 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 Given the patient's age, this is likely of doubtful clinical significance however vascular consultation can be performed as clinically indicated. Indeterminate calcified and cystic mass within the right adnexa.  Grossly this appears similar in size when compared to prior MRI exams. This is nonspecific in etiology however may be benign given stability over time. Pancolonic diverticulosis. Electronically Signed   By: Lovey Newcomer M.D.   On: 03/17/2016 11:34   Ct Cervical Spine Wo Contrast  Result Date: 03/17/2016 CLINICAL DATA:  Pain following fall.  Underlying dementia EXAM: CT HEAD WITHOUT CONTRAST CT CERVICAL SPINE WITHOUT CONTRAST TECHNIQUE: Multidetector CT imaging  of the head and cervical spine was performed following the standard protocol without intravenous contrast. Multiplanar CT image reconstructions of the cervical spine were also generated. COMPARISON:  CT head January 21, 2016; CT cervical spine November 10, 2015 FINDINGS: CT HEAD FINDINGS Brain: Moderate diffuse atrophy is stable. There is no evident intracranial mass, hemorrhage, extra-axial fluid collection, or midline shift. There is small vessel disease throughout the centra semiovale bilaterally, stable. No new gray-white compartment lesion is evident. No acute infarct appreciable. Calcification along the tentorium bilaterally is stable. Vascular: No hyper dense vessels. There is extensive carotid siphon calcification bilaterally as well as calcification in both distal vertebral arteries. Skull: The bony calvarium is intact. Sinuses/Orbits: There is opacification in a posterior right ethmoid air cell. There is slight mucosal thickening in several ethmoid air cells elsewhere. Other visualized paranasal sinuses are clear. Visualized orbits appear symmetric bilaterally. Note that the patient has had cataract removal on the right, stable. Other: Mastoid air cells are clear CT CERVICAL SPINE FINDINGS Alignment: There is cervical dextroscoliosis. There is just over 3 mm of anterolisthesis of C3 on C4, slightly increased from prior study. There is just under 2 mm of anterolisthesis of C4 on C5, stable. No spondylolisthesis is evident elsewhere. Skull base and  vertebrae: Skull base and craniocervical junction regions appear normal. There is moderate pannus posterior to the odontoid which is not causing appreciable impression on the craniocervical junction. There is no evident fracture. There are no blastic or lytic bone lesions. Soft tissues and spinal canal: The prevertebral soft tissues and predental space regions are normal. No paraspinous lesions are evident. There is no cord or canal hematoma evident. No spinal stenosis appreciable. Disc levels: There is marked disc space narrowing at C4-5. There is moderately severe disc space narrowing at C5-6 and C6-7. There is moderate disc space narrowing at C3-4 and C7-T1. There is facet hypertrophy at most levels bilaterally. There is no frank disc extrusion. Upper chest: There is aortic atherosclerosis. There is mild scarring in the lung apices superiorly. Other: There is calcification in both carotid arteries. There are multiple small nodular lesions in the thyroid without dominant thyroid mass evident. IMPRESSION: CT head: Stable atrophy with extensive supratentorial small vessel disease. No acute infarct evident. No mass, hemorrhage, or extra-axial fluid collection. Multiple foci of arteriovascular calcification present. There is ethmoid sinus disease, most notably in a posterior right ethmoid air cell, where there is opacification of much of this air cell. CT cervical spine: No fracture. Spondylolisthesis at C3-4 and C4-5. Note that there is slightly more anterolisthesis at C3-4 than on prior study. Spondylolisthesis at C4-5 stable. The spondylolisthesis at these levels is felt to be due to spondylosis underlying. There is multifocal arthropathy. No frank disc extrusion or high-grade stenosis. Aortic atherosclerosis. Bilateral carotid artery calcification. Multinodular goiter without dominant thyroid mass. Electronically Signed   By: Lowella Grip III M.D.   On: 03/17/2016 09:44   Ct Thoracic Spine Wo  Contrast  Result Date: 03/17/2016 CLINICAL DATA:  Back pain, fell last night, dementia EXAM: CT THORACIC AND LUMBAR SPINE WITHOUT CONTRAST TECHNIQUE: Multidetector CT imaging of the thoracic and lumbar spine was performed without contrast. Multiplanar CT image reconstructions were also generated. COMPARISON:  Chest radiograph 01/21/2016, MR pelvis 01/04/2010 FINDINGS: CT THORACIC SPINE FINDINGS Alignment: 12 rib pairs. Mild dextroconvex thoracic scoliosis. Otherwise normal. Vertebrae: Marked osseous demineralization. Chronic marked compression fracture of T12 vertebral body with retropulsion of a posterior fragment, significant AP narrowing of the spinal canal, and  evidence of spinal augmentation procedure. Remaining vertebral body heights maintained. Few scattered endplate spurs and mild disc space narrowing at mid thoracic levels. No acute thoracic spine fracture or subluxation Paraspinal and other soft tissues: Extensive atherosclerotic calcification aorta with aneurysmal dilatation of the descending thoracic aorta to 3.2 x 3.5 cm image 68. Coronary arterial calcifications. Heart appears enlarged. No definite thoracic adenopathy. Dependent atelectasis in both lungs. Calcified granuloma LEFT upper lobe. Disc levels: Grossly unremarkable. CT LUMBAR SPINE FINDINGS Segmentation: 5 non-rib-bearing lumbar type vertebra. Alignment: Normal Vertebrae: Marked osseous demineralization. Vertebral body heights maintained without acute fracture or subluxation. Multilevel facet degenerative changes. SI joints appear symmetric and preserved. No obvious sacral fracture. Paraspinal and other soft tissues: Extensive atherosclerotic calcifications abdominal aorta and iliac arteries. Partially calcified mass in the RIGHT pelvis, 6.9 x 5.3 x 8.2 cm, uncertain if arising from uterus or adnexa diet present since and grossly unchanged since an MRI of the pelvis from 2011. Disc levels: Diffusely narrowed.  No gross disc herniation.  IMPRESSION: CT THORACIC SPINE IMPRESSION Marked osseous demineralization. Old T12 compression fracture and vertebroplasty with stable retropulsion of a dominant fragment and AP narrowing of the spinal canal. No acute thoracic spine abnormalities. CT LUMBAR SPINE IMPRESSION Osseous demineralization with degenerative disc and facet disease changes lumbar spine. No acute lumbar spine abnormalities. Partially calcified 6.9 x 5.3 x 8.2 cm diameter mass within the RIGHT pelvis, present since 2011, either of RIGHT ovarian or less likely uterine origin, favor benign in light of time this has been present and similar in appearance. Extensive aortic atherosclerosis with mild aneurysmal dilatation of the descending thoracic aorta up to 3.2 x 3.5 cm. Coronary arterial calcifications. Electronically Signed   By: Lavonia Dana M.D.   On: 03/17/2016 11:51   Ct Lumbar Spine Wo Contrast  Result Date: 03/17/2016 CLINICAL DATA:  Back pain, fell last night, dementia EXAM: CT THORACIC AND LUMBAR SPINE WITHOUT CONTRAST TECHNIQUE: Multidetector CT imaging of the thoracic and lumbar spine was performed without contrast. Multiplanar CT image reconstructions were also generated. COMPARISON:  Chest radiograph 01/21/2016, MR pelvis 01/04/2010 FINDINGS: CT THORACIC SPINE FINDINGS Alignment: 12 rib pairs. Mild dextroconvex thoracic scoliosis. Otherwise normal. Vertebrae: Marked osseous demineralization. Chronic marked compression fracture of T12 vertebral body with retropulsion of a posterior fragment, significant AP narrowing of the spinal canal, and evidence of spinal augmentation procedure. Remaining vertebral body heights maintained. Few scattered endplate spurs and mild disc space narrowing at mid thoracic levels. No acute thoracic spine fracture or subluxation Paraspinal and other soft tissues: Extensive atherosclerotic calcification aorta with aneurysmal dilatation of the descending thoracic aorta to 3.2 x 3.5 cm image 68. Coronary  arterial calcifications. Heart appears enlarged. No definite thoracic adenopathy. Dependent atelectasis in both lungs. Calcified granuloma LEFT upper lobe. Disc levels: Grossly unremarkable. CT LUMBAR SPINE FINDINGS Segmentation: 5 non-rib-bearing lumbar type vertebra. Alignment: Normal Vertebrae: Marked osseous demineralization. Vertebral body heights maintained without acute fracture or subluxation. Multilevel facet degenerative changes. SI joints appear symmetric and preserved. No obvious sacral fracture. Paraspinal and other soft tissues: Extensive atherosclerotic calcifications abdominal aorta and iliac arteries. Partially calcified mass in the RIGHT pelvis, 6.9 x 5.3 x 8.2 cm, uncertain if arising from uterus or adnexa diet present since and grossly unchanged since an MRI of the pelvis from 2011. Disc levels: Diffusely narrowed.  No gross disc herniation. IMPRESSION: CT THORACIC SPINE IMPRESSION Marked osseous demineralization. Old T12 compression fracture and vertebroplasty with stable retropulsion of a dominant fragment and AP narrowing of the  spinal canal. No acute thoracic spine abnormalities. CT LUMBAR SPINE IMPRESSION Osseous demineralization with degenerative disc and facet disease changes lumbar spine. No acute lumbar spine abnormalities. Partially calcified 6.9 x 5.3 x 8.2 cm diameter mass within the RIGHT pelvis, present since 2011, either of RIGHT ovarian or less likely uterine origin, favor benign in light of time this has been present and similar in appearance. Extensive aortic atherosclerosis with mild aneurysmal dilatation of the descending thoracic aorta up to 3.2 x 3.5 cm. Coronary arterial calcifications. Electronically Signed   By: Lavonia Dana M.D.   On: 03/17/2016 11:51   Ct Abdomen Pelvis W Contrast  Result Date: 03/17/2016 CLINICAL DATA:  Patient status post fall. Evaluate for traumatic injury. EXAM: CT CHEST, ABDOMEN, AND PELVIS WITH CONTRAST TECHNIQUE: Multidetector CT imaging of  the chest, abdomen and pelvis was performed following the standard protocol during bolus administration of intravenous contrast. CONTRAST:  68mL ISOVUE-300 IOPAMIDOL (ISOVUE-300) INJECTION 61% COMPARISON:  MR pelvis 01/04/2010 FINDINGS: CT CHEST FINDINGS Cardiovascular: Heart is enlarged. No pericardial effusion. Peripheral atherosclerotic plaque involving the thoracic aorta. 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 The aorta measures up to 3.4 cm at this location (image 27; series 2). Mediastinum/Nodes: No enlarged axillary, mediastinal or hilar lymphadenopathy. Lungs/Pleura: Central airways are patent. Dependent atelectasis within the bilateral lower lobes. Calcified granuloma within the left upper lobe. 4 mm right middle lobe nodule (image 74; series 4). No pleural effusion or pneumothorax. Musculoskeletal: Mildly displaced fracture through the anterior left second and third ribs (image 47; series 4) (image 70; series 4). Nondisplaced posterior left ninth, twelfth and eleventh rib fractures. Chronic appearing T12 compression deformity with kyphoplasty material. Narrowing of the spinal canal at this level. Nondisplaced right tenth rib fracture. CT ABDOMEN PELVIS FINDINGS Hepatobiliary: Liver is normal in size and contour. Gallbladder is unremarkable. No intrahepatic or extrahepatic biliary ductal dilatation. Pancreas: Unremarkable Spleen: Unremarkable Adrenals/Urinary Tract: The adrenal glands are normal. Kidneys enhance symmetrically with contrast. There is a 1.7 x 1.8 cm exophytic lesion off the interpolar region of the left kidney within internal density of 49 Hounsfield units (image 58; series 2). Urinary bladder is dilated. Stomach/Bowel: Pancolonic diverticulosis. No CT evidence for acute diverticulitis. No free fluid or free intraperitoneal air. No evidence for bowel obstruction. Vascular/Lymphatic: Peripheral calcified atherosclerotic plaque involving the abdominal aorta. No  retroperitoneal lymphadenopathy. Reproductive: There is a large partially calcified right adnexal mass and associated cyst. Cyst measures up to 4 cm stone in dimension. Sizes this mass appears similar to prior MRI 01/04/2010. Other: None. Musculoskeletal: Old left inferior pubic ramus fracture. Osseous demineralization. Lumbar spine degenerative changes. No aggressive or acute appearing osseous lesions. IMPRESSION: Left anterior second and third mildly displaced rib fractures. Nondisplaced posterior left ninth, eleventh and twelfth rib fractures. Nondisplaced right tenth rib fracture. Chronic appearing T12 compression deformity with kyphoplasty material. There is narrowing of the spinal canal at this level. Otherwise no acute process within the chest, abdomen or pelvis. Indeterminate dense exophytic mass off the interpolar region of the left kidney. This may represent a complicated cyst or solid renal mass. Recommend dedicated evaluation with pre and post contrast-enhanced MRI or CT in the non acute setting. 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 Given the patient's age, this is likely of doubtful clinical significance however vascular consultation can be performed as clinically indicated. Indeterminate calcified and cystic mass within the right adnexa. Grossly this appears similar in size when  compared to prior MRI exams. This is nonspecific in etiology however may be benign given stability over time. Pancolonic diverticulosis. Electronically Signed   By: Lovey Newcomer M.D.   On: 03/17/2016 11:34   Dg Knee Complete 4 Views Right  Result Date: 03/17/2016 CLINICAL DATA:  Witnessed fall last night.  Pain. EXAM: RIGHT KNEE - COMPLETE 4+ VIEW COMPARISON:  None. FINDINGS: Complete transverse fracture of the patella, displaced a few mm with slight offset. No fracture of the femur, tibia or fibula. Small lipoma hemarthrosis. IMPRESSION: Complete  transverse fracture of the mid patella. Electronically Signed   By: Nelson Chimes M.D.   On: 03/17/2016 09:37   Dg Hip Unilat With Pelvis 2-3 Views Right  Result Date: 03/17/2016 CLINICAL DATA:  Witnessed fall last night. EXAM: DG HIP (WITH OR WITHOUT PELVIS) 2-3V RIGHT COMPARISON:  None. FINDINGS: No evidence of pelvic or hip fracture. Extensive pelvic calcifications presumed represent chronic benign uterine disease. Old healed rami fractures on the left. IMPRESSION: No acute finding. Electronically Signed   By: Nelson Chimes M.D.   On: 03/17/2016 09:35   Dg Femur, Min 2 Views Right  Result Date: 03/17/2016 CLINICAL DATA:  Witnessed fall last night.  Pain. EXAM: RIGHT FEMUR 2 VIEWS COMPARISON:  None. FINDINGS: There is no evidence of fracture or other focal bone lesions. Soft tissues are unremarkable. IMPRESSION: Negative. Electronically Signed   By: Nelson Chimes M.D.   On: 03/17/2016 09:36    Assessment/Plan Fracture of multiple ribs rib fractures (left 2 and 3; left 9,11and 12; right 10), 03/21/16 Norco 5/325mg  q6h prn. Prn Tylenol and Ibuprofen available to her.    Patella fracture Knee immobilizer.   Hallucinations Stable, continue Risperdal.   Edema Trace BLE  Depression, major Her mood is stable, continue Risperdal 0.5mg ,  Celexa 10mg   Vitamin B 12 deficiency Hgb 13.1 03/18/16, continue Vit B12  Hypokalemia 03/18/16 Na 137, K 4.0, Bun 24, creat 1.06  Constipation Stable, continue MiraLax daily  Hypothyroidism 12/13/15 TSH 5.99, MMSE 26/30 12/14/15 Levothyroxine 92mcg daily 01/22/16 TSH 3.135  Essential hypertension Controlled, continue Metoprolol 12.5mg  bid  Senile dementia 12/12/15 MMSE 20/30, SNF for continue care needs.       Family/ staff Communication: SNF  Labs/tests ordered:  none

## 2016-03-24 NOTE — Assessment & Plan Note (Signed)
12/12/15 MMSE 20/30, SNF for continue care needs.   

## 2016-03-24 NOTE — Assessment & Plan Note (Signed)
Controlled, continue Metoprolol 12.5mg bid.  

## 2016-03-24 NOTE — Assessment & Plan Note (Signed)
12/13/15 TSH 5.99, MMSE 26/30 12/14/15 Levothyroxine 25mcg daily 01/22/16 TSH 3.135 

## 2016-03-24 NOTE — Assessment & Plan Note (Signed)
Hgb 13.1 03/18/16, continue Vit B12

## 2016-03-24 NOTE — Assessment & Plan Note (Signed)
Stable, continue MiraLax daily.  

## 2016-03-24 NOTE — Assessment & Plan Note (Addendum)
rib fractures (left 2 and 3; left 9,11and 12; right 10), 03/21/16 Norco 5/325mg q6h prn. Prn Tylenol and Ibuprofen available to her.   

## 2016-03-28 DIAGNOSIS — M20001 Unspecified deformity of right finger(s): Secondary | ICD-10-CM | POA: Diagnosis not present

## 2016-03-28 DIAGNOSIS — M6281 Muscle weakness (generalized): Secondary | ICD-10-CM | POA: Diagnosis not present

## 2016-03-28 DIAGNOSIS — R2681 Unsteadiness on feet: Secondary | ICD-10-CM | POA: Diagnosis not present

## 2016-03-28 DIAGNOSIS — R278 Other lack of coordination: Secondary | ICD-10-CM | POA: Diagnosis not present

## 2016-03-28 DIAGNOSIS — R262 Difficulty in walking, not elsewhere classified: Secondary | ICD-10-CM | POA: Diagnosis not present

## 2016-03-30 DIAGNOSIS — R2681 Unsteadiness on feet: Secondary | ICD-10-CM | POA: Diagnosis not present

## 2016-03-30 DIAGNOSIS — M20001 Unspecified deformity of right finger(s): Secondary | ICD-10-CM | POA: Diagnosis not present

## 2016-03-30 DIAGNOSIS — R278 Other lack of coordination: Secondary | ICD-10-CM | POA: Diagnosis not present

## 2016-03-30 DIAGNOSIS — R262 Difficulty in walking, not elsewhere classified: Secondary | ICD-10-CM | POA: Diagnosis not present

## 2016-03-30 DIAGNOSIS — M6281 Muscle weakness (generalized): Secondary | ICD-10-CM | POA: Diagnosis not present

## 2016-03-31 DIAGNOSIS — M6281 Muscle weakness (generalized): Secondary | ICD-10-CM | POA: Diagnosis not present

## 2016-03-31 DIAGNOSIS — R278 Other lack of coordination: Secondary | ICD-10-CM | POA: Diagnosis not present

## 2016-03-31 DIAGNOSIS — R262 Difficulty in walking, not elsewhere classified: Secondary | ICD-10-CM | POA: Diagnosis not present

## 2016-03-31 DIAGNOSIS — R2681 Unsteadiness on feet: Secondary | ICD-10-CM | POA: Diagnosis not present

## 2016-03-31 DIAGNOSIS — M20001 Unspecified deformity of right finger(s): Secondary | ICD-10-CM | POA: Diagnosis not present

## 2016-04-03 DIAGNOSIS — R269 Unspecified abnormalities of gait and mobility: Secondary | ICD-10-CM | POA: Diagnosis not present

## 2016-04-03 LAB — VITAMIN B12: VITAMIN B 12: 986

## 2016-04-04 ENCOUNTER — Other Ambulatory Visit: Payer: Self-pay | Admitting: *Deleted

## 2016-04-04 DIAGNOSIS — R278 Other lack of coordination: Secondary | ICD-10-CM | POA: Diagnosis not present

## 2016-04-04 DIAGNOSIS — M6281 Muscle weakness (generalized): Secondary | ICD-10-CM | POA: Diagnosis not present

## 2016-04-04 DIAGNOSIS — R262 Difficulty in walking, not elsewhere classified: Secondary | ICD-10-CM | POA: Diagnosis not present

## 2016-04-04 DIAGNOSIS — S82034A Nondisplaced transverse fracture of right patella, initial encounter for closed fracture: Secondary | ICD-10-CM | POA: Diagnosis not present

## 2016-04-04 DIAGNOSIS — M20001 Unspecified deformity of right finger(s): Secondary | ICD-10-CM | POA: Diagnosis not present

## 2016-04-04 DIAGNOSIS — R2681 Unsteadiness on feet: Secondary | ICD-10-CM | POA: Diagnosis not present

## 2016-04-05 DIAGNOSIS — R278 Other lack of coordination: Secondary | ICD-10-CM | POA: Diagnosis not present

## 2016-04-05 DIAGNOSIS — R262 Difficulty in walking, not elsewhere classified: Secondary | ICD-10-CM | POA: Diagnosis not present

## 2016-04-05 DIAGNOSIS — M6281 Muscle weakness (generalized): Secondary | ICD-10-CM | POA: Diagnosis not present

## 2016-04-05 DIAGNOSIS — M20001 Unspecified deformity of right finger(s): Secondary | ICD-10-CM | POA: Diagnosis not present

## 2016-04-05 DIAGNOSIS — R2681 Unsteadiness on feet: Secondary | ICD-10-CM | POA: Diagnosis not present

## 2016-04-06 DIAGNOSIS — R2681 Unsteadiness on feet: Secondary | ICD-10-CM | POA: Diagnosis not present

## 2016-04-06 DIAGNOSIS — R278 Other lack of coordination: Secondary | ICD-10-CM | POA: Diagnosis not present

## 2016-04-06 DIAGNOSIS — M20001 Unspecified deformity of right finger(s): Secondary | ICD-10-CM | POA: Diagnosis not present

## 2016-04-06 DIAGNOSIS — R262 Difficulty in walking, not elsewhere classified: Secondary | ICD-10-CM | POA: Diagnosis not present

## 2016-04-06 DIAGNOSIS — M6281 Muscle weakness (generalized): Secondary | ICD-10-CM | POA: Diagnosis not present

## 2016-04-07 ENCOUNTER — Non-Acute Institutional Stay (SKILLED_NURSING_FACILITY): Payer: Medicare Other | Admitting: Nurse Practitioner

## 2016-04-07 ENCOUNTER — Encounter: Payer: Self-pay | Admitting: Nurse Practitioner

## 2016-04-07 DIAGNOSIS — S82092A Other fracture of left patella, initial encounter for closed fracture: Secondary | ICD-10-CM

## 2016-04-07 DIAGNOSIS — F411 Generalized anxiety disorder: Secondary | ICD-10-CM | POA: Diagnosis not present

## 2016-04-07 DIAGNOSIS — R443 Hallucinations, unspecified: Secondary | ICD-10-CM | POA: Diagnosis not present

## 2016-04-07 DIAGNOSIS — E538 Deficiency of other specified B group vitamins: Secondary | ICD-10-CM

## 2016-04-07 DIAGNOSIS — F321 Major depressive disorder, single episode, moderate: Secondary | ICD-10-CM | POA: Diagnosis not present

## 2016-04-07 DIAGNOSIS — S2242XD Multiple fractures of ribs, left side, subsequent encounter for fracture with routine healing: Secondary | ICD-10-CM | POA: Diagnosis not present

## 2016-04-07 DIAGNOSIS — E039 Hypothyroidism, unspecified: Secondary | ICD-10-CM | POA: Diagnosis not present

## 2016-04-07 DIAGNOSIS — K5900 Constipation, unspecified: Secondary | ICD-10-CM

## 2016-04-07 DIAGNOSIS — F0390 Unspecified dementia without behavioral disturbance: Secondary | ICD-10-CM | POA: Diagnosis not present

## 2016-04-07 DIAGNOSIS — I10 Essential (primary) hypertension: Secondary | ICD-10-CM | POA: Diagnosis not present

## 2016-04-07 DIAGNOSIS — R609 Edema, unspecified: Secondary | ICD-10-CM

## 2016-04-07 DIAGNOSIS — E876 Hypokalemia: Secondary | ICD-10-CM | POA: Diagnosis not present

## 2016-04-07 NOTE — Assessment & Plan Note (Signed)
Knee immobilizer.

## 2016-04-07 NOTE — Assessment & Plan Note (Signed)
12/12/15 MMSE 20/30, SNF for continue care needs.

## 2016-04-07 NOTE — Assessment & Plan Note (Signed)
Her mood is stable, continue Risperdal 0.5mg ,  Celexa 10mg . Prn Lorazepam 0.5mg  q8h prn utilized 2x in the past 14 days, effective, no observed AR so far, continue, re-evaluate in a month.

## 2016-04-07 NOTE — Assessment & Plan Note (Signed)
12/13/15 TSH 5.99, MMSE 26/30 12/14/15 Levothyroxine 64mcg daily 01/22/16 TSH 3.135

## 2016-04-07 NOTE — Progress Notes (Signed)
Location:  [facility] Room Number: 12 Place of Service:  SNF (31) Provider: Maysen Bonsignore, Manxie  NP  Jeanmarie Hubert, MD  Patient Care Team: Estill Dooms, MD as PCP - General (Internal Medicine) [facility] Gaynelle Arabian, MD as Consulting Physician (Orthopedic Surgery) Hajar Penninger Otho Darner, NP as Nurse Practitioner (Internal Medicine)  Extended Emergency Contact Information Primary Emergency Contact: Peri Jefferson [place] of [place] Mobile Phone: [phone] Relation: Daughter Secondary Emergency Contact: Kerby Less [place] of [place] Phone: [phone] Relation: Daughter  Code Status:  DNR Goals of care: Advanced Directive information Advanced Directives 04/07/2016  Does Patient Have a Medical Advance Directive? Yes  Type of Paramedic of [place];Out of facility DNR (pink MOST or yellow form)  Does patient want to make changes to medical advance directive? No - Patient declined  Copy of [place] in Chart? Yes  Pre-existing out of facility DNR order (yellow form or pink MOST form) Yellow form placed in chart (order not valid for inpatient use)     Chief Complaint  Patient presents with  . Acute Visit    HPI:  Pt is a 83 y.o. female seen today for an acute visit for anxiety/hallucination.  She is taking Risperdal 0.5mg ,  Celexa 10mg . Prn Lorazepam 0.5mg  q8h prn utilized 2x in the past 14 days, effective, no observed AR so far    hospitalization 03/17/16 to 03/19/16. She had a fall and sustained patellar fx of the right knee and rib fractures (left 2 and 3; left 9,11and 12; right 10). Prn Tylenol, Norco, Ibuprofen are available to her for pain.              Hx of hallucination, insomnia, anxiety, stable while on Risperdal 0.5mg  qd, Celexa 10mg , HTN, taking Metoprolol 12.5mg  bidl. Chronic edema BLE is minimal, off Furosemide 20mg . TSH 5.99 12/13/15, started Levothyroxine 18mcg TSH 3.135 01/22/16  Past  Medical History:  Diagnosis Date  . Abnormality of gait 03/15/2010  . Anxiety state, unspecified 03/15/2010  . Depression, major 06/15/2015  . Depressive disorder, not elsewhere classified 03/15/2010  . Dysphagia, unspecified(787.20) 03/15/2010  . Generalized anxiety disorder 06/23/2014  . Herpes zoster without mention of complication 0/09/3816  . Other alteration of consciousness 03/15/2010  . Pain in hand 08/13/12  . Personal history of fall 03/15/2010  . Senile osteoporosis 03/15/2010  . Unspecified constipation 03/15/2010  . Unspecified essential hypertension 03/15/2010  . Unspecified hearing loss 08/22/2011  . Unspecified urinary incontinence 03/15/2010  . Urinary incontinence 03/15/2010  . Vaginal prolapse 06/15/2015   Past Surgical History:  Procedure Laterality Date  . INCISION / DRAINAGE HAND / FINGER Right 06/2008   hand  . SPINE SURGERY  12/2006    Allergies  Allergen Reactions  . Ace Inhibitors Other (See Comments)    Reaction unknown/ listed on MAR  . Hydrochlorothiazide Other (See Comments)    Reaction unknown/ listed on MAR  . Morphine And Related Other (See Comments)    Hallucinations    Allergies as of 04/07/2016      Reactions   Ace Inhibitors Other (See Comments)   Reaction unknown/ listed on MAR   Hydrochlorothiazide Other (See Comments)   Reaction unknown/ listed on MAR   Morphine And Related Other (See Comments)   Hallucinations      Medication List       Accurate as of 04/07/16 11:59 PM. Always use your most recent med list.  acetaminophen 325 MG tablet Commonly known as:  TYLENOL Take 2 tablets (650 mg total) by mouth every 6 (six) hours as needed for mild pain (or Fever >/= 101).   aspirin EC 81 MG tablet Take 1 tablet (81 mg total) by mouth daily.   CALCIUM 600+D 600-200 MG-UNIT Tabs Generic drug:  Calcium Carbonate-Vitamin D Take 1 tablet by mouth daily.   citalopram 10 MG tablet Commonly known as:  CELEXA Take one table once daily    cyanocobalamin 1000 MCG tablet Take 1,000 mcg by mouth. Take one tablet once daily for Vitamin B-12   feeding supplement Liqd Take 1 Container by mouth 2 (two) times daily between meals.   HYDROcodone-acetaminophen 5-325 MG tablet Commonly known as:  NORCO/VICODIN Take 1 tablet by mouth every 6 (six) hours as needed for moderate pain.   ibuprofen 200 MG tablet Commonly known as:  ADVIL,MOTRIN Take 400 mg by mouth every 6 (six) hours as needed (for pain).   levothyroxine 25 MCG tablet Commonly known as:  SYNTHROID, LEVOTHROID Take 25 mcg by mouth daily before breakfast.   LORazepam 0.5 MG tablet Commonly known as:  ATIVAN Take 0.5 mg by mouth every 8 (eight) hours as needed for anxiety.   metoprolol tartrate 25 MG tablet Commonly known as:  LOPRESSOR Take 25 mg by mouth. Take 1/2 tablet (12.5 mg) twice a day   mineral oil-hydrophilic petrolatum ointment Apply 1 application topically daily as needed for dry skin.   polyethylene glycol packet Commonly known as:  MIRALAX / GLYCOLAX Take 17 g by mouth daily.   PROCTO-MED HC 2.5 % rectal cream Generic drug:  hydrocortisone Place 1 application rectally every 12 (twelve) hours as needed for hemorrhoids or itching.   risperiDONE 0.5 MG tablet Commonly known as:  RISPERDAL Take 0.5 mg by mouth. Take one tablet at bedtime       Review of Systems  Constitutional: Negative for activity change, appetite change and fatigue.  HENT: Positive for hearing loss. Negative for ear pain.   Eyes: Negative.   Respiratory: Negative.  Negative for shortness of breath.   Cardiovascular: Positive for leg swelling. Negative for chest pain and palpitations.       HTN  Gastrointestinal: Negative.   Endocrine: Negative.   Genitourinary:       Incontinence  Musculoskeletal: Positive for arthralgias (in feet).       Pain in both hands: R>L. Trigger fingers at roght 3rd and 4th. Contracted into palm most of the time. Right patellar fracture.  Right leg in long leg Velcro closure splint.  Skin: Negative for rash (Erythematous rash of the lower legs nonspecific nature).       Resolving the right peri orbital ecchymosis   Neurological: Negative.   Hematological: Negative.   Psychiatric/Behavioral: Positive for confusion and sleep disturbance (Wakes at 3 AM. Full of energy then. Uses Motrin PM to get back to sleep. ). The patient is nervous/anxious.        Hx of hallucinations.    Immunization History  Administered Date(s) Administered  . Influenza Whole 10/24/2011, 10/24/2012  . Influenza-Unspecified 11/06/2013, 11/04/2015  . PPD Test 01/11/2015  . Pneumococcal Polysaccharide-23 01/23/2009  . Td 01/23/2009   Pertinent  Health Maintenance Due  Topic Date Due  . DEXA SCAN  07/26/1984  . PNA vac Low Risk Adult (2 of 2 - PCV13) 01/23/2010  . INFLUENZA VACCINE  Completed   Fall Risk  03/20/2016 09/21/2015 06/15/2015 01/05/2015 12/29/2014  Falls in the past year? Yes  No No No No  Number falls in past yr: - - - - -  Injury with Fall? Yes - - - -  Risk Factor Category  High Fall Risk - - - -   Functional Status Survey:    Vitals:   04/07/16 1507  BP: (!) 154/78  Pulse: 66  Resp: 12  Temp: 97.1 F (36.2 C)  Weight: 118 lb 9.6 oz (53.8 kg)  Height: 5\' 2"  (1.575 m)   Body mass index is 21.69 kg/m. Physical Exam  Constitutional: She appears well-developed and well-nourished. No distress.  HENT:  Head: Normocephalic and atraumatic.  Severe hearing loss bilaterally. Wearing hearing aids.  Eyes: Conjunctivae and EOM are normal. Pupils are equal, round, and reactive to light.  Neck: Normal range of motion. Neck supple. No JVD present. No tracheal deviation present. No thyromegaly present.  Cardiovascular: Normal rate, regular rhythm and normal heart sounds.  Exam reveals no gallop and no friction rub.   No murmur heard. Pulmonary/Chest: No respiratory distress. She has no wheezes. She has no rales. She exhibits no  tenderness.  Abdominal: Bowel sounds are normal. She exhibits no distension and no mass. There is no tenderness.  Genitourinary: Rectal exam shows guaiac negative stool.  Genitourinary Comments: Examined 06/15/15: Firm area at the top of the vagina. Prolapse of the vaginal walls. Loss of rectal sphincter tone.  Musculoskeletal: Normal range of motion. She exhibits edema. She exhibits no tenderness.  Pain in hands. Right 3rd and 4th trigger finges with contracturesr. Uses walker due to unstable gait. Left 2nd toe is deviated to the great toe and has elevated DIP joint that rubs on her shoe. Trace edema R+L ankles Right leg in long leg splint with Velcro closure.  Lymphadenopathy:    She has no cervical adenopathy.  Neurological: She is alert. No cranial nerve deficit. Coordination normal.  12/29/14 MMSE 26/30 . Failed clock drawing.  Skin: No rash (Nonspecific erythematous rash in both lower legs.) noted. No erythema. No pallor.  Resolving the right peri orbital ecchymosis   Psychiatric:  Reticent. Stares, but does not always respond.    Labs reviewed:  Recent Labs  01/21/16 1815 01/22/16 0532 01/27/16 03/17/16 0907 03/18/16 0847  NA  --  137 138 139 137  K  --  4.2 4.1 3.3* 4.0  CL  --  103  --  102 102  CO2  --  27  --  29 26  GLUCOSE  --  85  --  97 110*  BUN  --  25* 19 17 24*  CREATININE  --  0.96 0.8 0.62 1.06*  CALCIUM  --  8.6*  --  9.1 9.0  MG 1.9  --   --   --   --     Recent Labs  01/21/16 1412 03/17/16 0907  AST 19 18  ALT 16 13*  ALKPHOS 62 59  BILITOT 0.8 1.1  PROT 6.4* 6.9  ALBUMIN 3.4* 4.2    Recent Labs  11/10/15 1452 01/21/16 1412 01/22/16 0532 01/27/16 03/17/16 0907 03/18/16 0431  WBC 8.1 10.4 7.5 5.8 10.0 11.5*  NEUTROABS 5.8 8.0*  --   --  6.8  --   HGB 13.1 12.0 11.9* 11.3* 13.8 13.1  HCT 38.5 35.5* 35.3* 34* 40.5 38.5  MCV 98.2 98.3 97.8  --  96.4 96.3  PLT 343 363 327 340 337 355   Lab Results  Component Value Date   TSH 3.135  01/22/2016   Lab Results  Component Value Date   HGBA1C 5.6 12/11/2014   Lab Results  Component Value Date   CHOL 178 12/11/2014   HDL 60 12/11/2014   LDLCALC 97 12/11/2014   TRIG 106 12/11/2014   CHOLHDL 3.0 12/11/2014    Significant Diagnostic Results in last 30 days:  Dg Chest 2 View  Result Date: 03/17/2016 CLINICAL DATA:  Witnessed fall last night. EXAM: CHEST  2 VIEW COMPARISON:  01/21/2016 FINDINGS: Frontal view degraded by motion. There is left ventricular prominence. There is chronic aortic atherosclerosis. Right lung is clear. There is abnormal density in the left lower lobe that could be atelectasis and/or pneumonia. Compared to the previous study, there is slight further loss of height of a lower thoracic vertebral body. Old augmented compression fracture down lower appears unchanged. IMPRESSION: Left lower lobe atelectasis and/or pneumonia. Slight compression fracture in the mid to lower thoracic spine. Electronically Signed   By: Nelson Chimes M.D.   On: 03/17/2016 09:34   Ct Head Wo Contrast  Result Date: 03/17/2016 CLINICAL DATA:  Pain following fall.  Underlying dementia EXAM: CT HEAD WITHOUT CONTRAST CT CERVICAL SPINE WITHOUT CONTRAST TECHNIQUE: Multidetector CT imaging of the head and cervical spine was performed following the standard protocol without intravenous contrast. Multiplanar CT image reconstructions of the cervical spine were also generated. COMPARISON:  CT head January 21, 2016; CT cervical spine November 10, 2015 FINDINGS: CT HEAD FINDINGS Brain: Moderate diffuse atrophy is stable. There is no evident intracranial mass, hemorrhage, extra-axial fluid collection, or midline shift. There is small vessel disease throughout the centra semiovale bilaterally, stable. No new gray-white compartment lesion is evident. No acute infarct appreciable. Calcification along the tentorium bilaterally is stable. Vascular: No hyper dense vessels. There is extensive carotid siphon  calcification bilaterally as well as calcification in both distal vertebral arteries. Skull: The bony calvarium is intact. Sinuses/Orbits: There is opacification in a posterior right ethmoid air cell. There is slight mucosal thickening in several ethmoid air cells elsewhere. Other visualized paranasal sinuses are clear. Visualized orbits appear symmetric bilaterally. Note that the patient has had cataract removal on the right, stable. Other: Mastoid air cells are clear CT CERVICAL SPINE FINDINGS Alignment: There is cervical dextroscoliosis. There is just over 3 mm of anterolisthesis of C3 on C4, slightly increased from prior study. There is just under 2 mm of anterolisthesis of C4 on C5, stable. No spondylolisthesis is evident elsewhere. Skull base and vertebrae: Skull base and craniocervical junction regions appear normal. There is moderate pannus posterior to the odontoid which is not causing appreciable impression on the craniocervical junction. There is no evident fracture. There are no blastic or lytic bone lesions. Soft tissues and spinal canal: The prevertebral soft tissues and predental space regions are normal. No paraspinous lesions are evident. There is no cord or canal hematoma evident. No spinal stenosis appreciable. Disc levels: There is marked disc space narrowing at C4-5. There is moderately severe disc space narrowing at C5-6 and C6-7. There is moderate disc space narrowing at C3-4 and C7-T1. There is facet hypertrophy at most levels bilaterally. There is no frank disc extrusion. Upper chest: There is aortic atherosclerosis. There is mild scarring in the lung apices superiorly. Other: There is calcification in both carotid arteries. There are multiple small nodular lesions in the thyroid without dominant thyroid mass evident. IMPRESSION: CT head: Stable atrophy with extensive supratentorial small vessel disease. No acute infarct evident. No mass, hemorrhage, or extra-axial fluid collection. Multiple  foci of arteriovascular calcification present.  There is ethmoid sinus disease, most notably in a posterior right ethmoid air cell, where there is opacification of much of this air cell. CT cervical spine: No fracture. Spondylolisthesis at C3-4 and C4-5. Note that there is slightly more anterolisthesis at C3-4 than on prior study. Spondylolisthesis at C4-5 stable. The spondylolisthesis at these levels is felt to be due to spondylosis underlying. There is multifocal arthropathy. No frank disc extrusion or high-grade stenosis. Aortic atherosclerosis. Bilateral carotid artery calcification. Multinodular goiter without dominant thyroid mass. Electronically Signed   By: Lowella Grip III M.D.   On: 03/17/2016 09:44   Ct Chest W Contrast  Result Date: 03/17/2016 CLINICAL DATA:  Patient status post fall. Evaluate for traumatic injury. EXAM: CT CHEST, ABDOMEN, AND PELVIS WITH CONTRAST TECHNIQUE: Multidetector CT imaging of the chest, abdomen and pelvis was performed following the standard protocol during bolus administration of intravenous contrast. CONTRAST:  66mL ISOVUE-300 IOPAMIDOL (ISOVUE-300) INJECTION 61% COMPARISON:  MR pelvis 01/04/2010 FINDINGS: CT CHEST FINDINGS Cardiovascular: Heart is enlarged. No pericardial effusion. Peripheral atherosclerotic plaque involving the thoracic aorta. 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 The aorta measures up to 3.4 cm at this location (image 27; series 2). Mediastinum/Nodes: No enlarged axillary, mediastinal or hilar lymphadenopathy. Lungs/Pleura: Central airways are patent. Dependent atelectasis within the bilateral lower lobes. Calcified granuloma within the left upper lobe. 4 mm right middle lobe nodule (image 74; series 4). No pleural effusion or pneumothorax. Musculoskeletal: Mildly displaced fracture through the anterior left second and third ribs (image 47; series 4) (image 70; series 4). Nondisplaced posterior left ninth, twelfth and  eleventh rib fractures. Chronic appearing T12 compression deformity with kyphoplasty material. Narrowing of the spinal canal at this level. Nondisplaced right tenth rib fracture. CT ABDOMEN PELVIS FINDINGS Hepatobiliary: Liver is normal in size and contour. Gallbladder is unremarkable. No intrahepatic or extrahepatic biliary ductal dilatation. Pancreas: Unremarkable Spleen: Unremarkable Adrenals/Urinary Tract: The adrenal glands are normal. Kidneys enhance symmetrically with contrast. There is a 1.7 x 1.8 cm exophytic lesion off the interpolar region of the left kidney within internal density of 49 Hounsfield units (image 58; series 2). Urinary bladder is dilated. Stomach/Bowel: Pancolonic diverticulosis. No CT evidence for acute diverticulitis. No free fluid or free intraperitoneal air. No evidence for bowel obstruction. Vascular/Lymphatic: Peripheral calcified atherosclerotic plaque involving the abdominal aorta. No retroperitoneal lymphadenopathy. Reproductive: There is a large partially calcified right adnexal mass and associated cyst. Cyst measures up to 4 cm stone in dimension. Sizes this mass appears similar to prior MRI 01/04/2010. Other: None. Musculoskeletal: Old left inferior pubic ramus fracture. Osseous demineralization. Lumbar spine degenerative changes. No aggressive or acute appearing osseous lesions. IMPRESSION: Left anterior second and third mildly displaced rib fractures. Nondisplaced posterior left ninth, eleventh and twelfth rib fractures. Nondisplaced right tenth rib fracture. Chronic appearing T12 compression deformity with kyphoplasty material. There is narrowing of the spinal canal at this level. Otherwise no acute process within the chest, abdomen or pelvis. Indeterminate dense exophytic mass off the interpolar region of the left kidney. This may represent a complicated cyst or solid renal mass. Recommend dedicated evaluation with pre and post contrast-enhanced MRI or CT in the non acute  setting. 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 Given the patient's age, this is likely of doubtful clinical significance however vascular consultation can be performed as clinically indicated. Indeterminate calcified and cystic mass within the right adnexa. Grossly this appears similar in size when compared  to prior MRI exams. This is nonspecific in etiology however may be benign given stability over time. Pancolonic diverticulosis. Electronically Signed   By: Lovey Newcomer M.D.   On: 03/17/2016 11:34   Ct Cervical Spine Wo Contrast  Result Date: 03/17/2016 CLINICAL DATA:  Pain following fall.  Underlying dementia EXAM: CT HEAD WITHOUT CONTRAST CT CERVICAL SPINE WITHOUT CONTRAST TECHNIQUE: Multidetector CT imaging of the head and cervical spine was performed following the standard protocol without intravenous contrast. Multiplanar CT image reconstructions of the cervical spine were also generated. COMPARISON:  CT head January 21, 2016; CT cervical spine November 10, 2015 FINDINGS: CT HEAD FINDINGS Brain: Moderate diffuse atrophy is stable. There is no evident intracranial mass, hemorrhage, extra-axial fluid collection, or midline shift. There is small vessel disease throughout the centra semiovale bilaterally, stable. No new gray-white compartment lesion is evident. No acute infarct appreciable. Calcification along the tentorium bilaterally is stable. Vascular: No hyper dense vessels. There is extensive carotid siphon calcification bilaterally as well as calcification in both distal vertebral arteries. Skull: The bony calvarium is intact. Sinuses/Orbits: There is opacification in a posterior right ethmoid air cell. There is slight mucosal thickening in several ethmoid air cells elsewhere. Other visualized paranasal sinuses are clear. Visualized orbits appear symmetric bilaterally. Note that the patient has had cataract removal on the right,  stable. Other: Mastoid air cells are clear CT CERVICAL SPINE FINDINGS Alignment: There is cervical dextroscoliosis. There is just over 3 mm of anterolisthesis of C3 on C4, slightly increased from prior study. There is just under 2 mm of anterolisthesis of C4 on C5, stable. No spondylolisthesis is evident elsewhere. Skull base and vertebrae: Skull base and craniocervical junction regions appear normal. There is moderate pannus posterior to the odontoid which is not causing appreciable impression on the craniocervical junction. There is no evident fracture. There are no blastic or lytic bone lesions. Soft tissues and spinal canal: The prevertebral soft tissues and predental space regions are normal. No paraspinous lesions are evident. There is no cord or canal hematoma evident. No spinal stenosis appreciable. Disc levels: There is marked disc space narrowing at C4-5. There is moderately severe disc space narrowing at C5-6 and C6-7. There is moderate disc space narrowing at C3-4 and C7-T1. There is facet hypertrophy at most levels bilaterally. There is no frank disc extrusion. Upper chest: There is aortic atherosclerosis. There is mild scarring in the lung apices superiorly. Other: There is calcification in both carotid arteries. There are multiple small nodular lesions in the thyroid without dominant thyroid mass evident. IMPRESSION: CT head: Stable atrophy with extensive supratentorial small vessel disease. No acute infarct evident. No mass, hemorrhage, or extra-axial fluid collection. Multiple foci of arteriovascular calcification present. There is ethmoid sinus disease, most notably in a posterior right ethmoid air cell, where there is opacification of much of this air cell. CT cervical spine: No fracture. Spondylolisthesis at C3-4 and C4-5. Note that there is slightly more anterolisthesis at C3-4 than on prior study. Spondylolisthesis at C4-5 stable. The spondylolisthesis at these levels is felt to be due to  spondylosis underlying. There is multifocal arthropathy. No frank disc extrusion or high-grade stenosis. Aortic atherosclerosis. Bilateral carotid artery calcification. Multinodular goiter without dominant thyroid mass. Electronically Signed   By: Lowella Grip III M.D.   On: 03/17/2016 09:44   Ct Thoracic Spine Wo Contrast  Result Date: 03/17/2016 CLINICAL DATA:  Back pain, fell last night, dementia EXAM: CT THORACIC AND LUMBAR SPINE WITHOUT CONTRAST TECHNIQUE: Multidetector CT  imaging of the thoracic and lumbar spine was performed without contrast. Multiplanar CT image reconstructions were also generated. COMPARISON:  Chest radiograph 01/21/2016, MR pelvis 01/04/2010 FINDINGS: CT THORACIC SPINE FINDINGS Alignment: 12 rib pairs. Mild dextroconvex thoracic scoliosis. Otherwise normal. Vertebrae: Marked osseous demineralization. Chronic marked compression fracture of T12 vertebral body with retropulsion of a posterior fragment, significant AP narrowing of the spinal canal, and evidence of spinal augmentation procedure. Remaining vertebral body heights maintained. Few scattered endplate spurs and mild disc space narrowing at mid thoracic levels. No acute thoracic spine fracture or subluxation Paraspinal and other soft tissues: Extensive atherosclerotic calcification aorta with aneurysmal dilatation of the descending thoracic aorta to 3.2 x 3.5 cm image 68. Coronary arterial calcifications. Heart appears enlarged. No definite thoracic adenopathy. Dependent atelectasis in both lungs. Calcified granuloma LEFT upper lobe. Disc levels: Grossly unremarkable. CT LUMBAR SPINE FINDINGS Segmentation: 5 non-rib-bearing lumbar type vertebra. Alignment: Normal Vertebrae: Marked osseous demineralization. Vertebral body heights maintained without acute fracture or subluxation. Multilevel facet degenerative changes. SI joints appear symmetric and preserved. No obvious sacral fracture. Paraspinal and other soft tissues:  Extensive atherosclerotic calcifications abdominal aorta and iliac arteries. Partially calcified mass in the RIGHT pelvis, 6.9 x 5.3 x 8.2 cm, uncertain if arising from uterus or adnexa diet present since and grossly unchanged since an MRI of the pelvis from 2011. Disc levels: Diffusely narrowed.  No gross disc herniation. IMPRESSION: CT THORACIC SPINE IMPRESSION Marked osseous demineralization. Old T12 compression fracture and vertebroplasty with stable retropulsion of a dominant fragment and AP narrowing of the spinal canal. No acute thoracic spine abnormalities. CT LUMBAR SPINE IMPRESSION Osseous demineralization with degenerative disc and facet disease changes lumbar spine. No acute lumbar spine abnormalities. Partially calcified 6.9 x 5.3 x 8.2 cm diameter mass within the RIGHT pelvis, present since 2011, either of RIGHT ovarian or less likely uterine origin, favor benign in light of time this has been present and similar in appearance. Extensive aortic atherosclerosis with mild aneurysmal dilatation of the descending thoracic aorta up to 3.2 x 3.5 cm. Coronary arterial calcifications. Electronically Signed   By: Lavonia Dana M.D.   On: 03/17/2016 11:51   Ct Lumbar Spine Wo Contrast  Result Date: 03/17/2016 CLINICAL DATA:  Back pain, fell last night, dementia EXAM: CT THORACIC AND LUMBAR SPINE WITHOUT CONTRAST TECHNIQUE: Multidetector CT imaging of the thoracic and lumbar spine was performed without contrast. Multiplanar CT image reconstructions were also generated. COMPARISON:  Chest radiograph 01/21/2016, MR pelvis 01/04/2010 FINDINGS: CT THORACIC SPINE FINDINGS Alignment: 12 rib pairs. Mild dextroconvex thoracic scoliosis. Otherwise normal. Vertebrae: Marked osseous demineralization. Chronic marked compression fracture of T12 vertebral body with retropulsion of a posterior fragment, significant AP narrowing of the spinal canal, and evidence of spinal augmentation procedure. Remaining vertebral body  heights maintained. Few scattered endplate spurs and mild disc space narrowing at mid thoracic levels. No acute thoracic spine fracture or subluxation Paraspinal and other soft tissues: Extensive atherosclerotic calcification aorta with aneurysmal dilatation of the descending thoracic aorta to 3.2 x 3.5 cm image 68. Coronary arterial calcifications. Heart appears enlarged. No definite thoracic adenopathy. Dependent atelectasis in both lungs. Calcified granuloma LEFT upper lobe. Disc levels: Grossly unremarkable. CT LUMBAR SPINE FINDINGS Segmentation: 5 non-rib-bearing lumbar type vertebra. Alignment: Normal Vertebrae: Marked osseous demineralization. Vertebral body heights maintained without acute fracture or subluxation. Multilevel facet degenerative changes. SI joints appear symmetric and preserved. No obvious sacral fracture. Paraspinal and other soft tissues: Extensive atherosclerotic calcifications abdominal aorta and iliac arteries. Partially calcified  mass in the RIGHT pelvis, 6.9 x 5.3 x 8.2 cm, uncertain if arising from uterus or adnexa diet present since and grossly unchanged since an MRI of the pelvis from 2011. Disc levels: Diffusely narrowed.  No gross disc herniation. IMPRESSION: CT THORACIC SPINE IMPRESSION Marked osseous demineralization. Old T12 compression fracture and vertebroplasty with stable retropulsion of a dominant fragment and AP narrowing of the spinal canal. No acute thoracic spine abnormalities. CT LUMBAR SPINE IMPRESSION Osseous demineralization with degenerative disc and facet disease changes lumbar spine. No acute lumbar spine abnormalities. Partially calcified 6.9 x 5.3 x 8.2 cm diameter mass within the RIGHT pelvis, present since 2011, either of RIGHT ovarian or less likely uterine origin, favor benign in light of time this has been present and similar in appearance. Extensive aortic atherosclerosis with mild aneurysmal dilatation of the descending thoracic aorta up to 3.2 x 3.5 cm.  Coronary arterial calcifications. Electronically Signed   By: Lavonia Dana M.D.   On: 03/17/2016 11:51   Ct Abdomen Pelvis W Contrast  Result Date: 03/17/2016 CLINICAL DATA:  Patient status post fall. Evaluate for traumatic injury. EXAM: CT CHEST, ABDOMEN, AND PELVIS WITH CONTRAST TECHNIQUE: Multidetector CT imaging of the chest, abdomen and pelvis was performed following the standard protocol during bolus administration of intravenous contrast. CONTRAST:  52mL ISOVUE-300 IOPAMIDOL (ISOVUE-300) INJECTION 61% COMPARISON:  MR pelvis 01/04/2010 FINDINGS: CT CHEST FINDINGS Cardiovascular: Heart is enlarged. No pericardial effusion. Peripheral atherosclerotic plaque involving the thoracic aorta. 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 The aorta measures up to 3.4 cm at this location (image 27; series 2). Mediastinum/Nodes: No enlarged axillary, mediastinal or hilar lymphadenopathy. Lungs/Pleura: Central airways are patent. Dependent atelectasis within the bilateral lower lobes. Calcified granuloma within the left upper lobe. 4 mm right middle lobe nodule (image 74; series 4). No pleural effusion or pneumothorax. Musculoskeletal: Mildly displaced fracture through the anterior left second and third ribs (image 47; series 4) (image 70; series 4). Nondisplaced posterior left ninth, twelfth and eleventh rib fractures. Chronic appearing T12 compression deformity with kyphoplasty material. Narrowing of the spinal canal at this level. Nondisplaced right tenth rib fracture. CT ABDOMEN PELVIS FINDINGS Hepatobiliary: Liver is normal in size and contour. Gallbladder is unremarkable. No intrahepatic or extrahepatic biliary ductal dilatation. Pancreas: Unremarkable Spleen: Unremarkable Adrenals/Urinary Tract: The adrenal glands are normal. Kidneys enhance symmetrically with contrast. There is a 1.7 x 1.8 cm exophytic lesion off the interpolar region of the left kidney within internal density of 49  Hounsfield units (image 58; series 2). Urinary bladder is dilated. Stomach/Bowel: Pancolonic diverticulosis. No CT evidence for acute diverticulitis. No free fluid or free intraperitoneal air. No evidence for bowel obstruction. Vascular/Lymphatic: Peripheral calcified atherosclerotic plaque involving the abdominal aorta. No retroperitoneal lymphadenopathy. Reproductive: There is a large partially calcified right adnexal mass and associated cyst. Cyst measures up to 4 cm stone in dimension. Sizes this mass appears similar to prior MRI 01/04/2010. Other: None. Musculoskeletal: Old left inferior pubic ramus fracture. Osseous demineralization. Lumbar spine degenerative changes. No aggressive or acute appearing osseous lesions. IMPRESSION: Left anterior second and third mildly displaced rib fractures. Nondisplaced posterior left ninth, eleventh and twelfth rib fractures. Nondisplaced right tenth rib fracture. Chronic appearing T12 compression deformity with kyphoplasty material. There is narrowing of the spinal canal at this level. Otherwise no acute process within the chest, abdomen or pelvis. Indeterminate dense exophytic mass off the interpolar region of the left kidney. This may represent a complicated cyst or solid renal  mass. Recommend dedicated evaluation with pre and post contrast-enhanced MRI or CT in the non acute setting. 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 Given the patient's age, this is likely of doubtful clinical significance however vascular consultation can be performed as clinically indicated. Indeterminate calcified and cystic mass within the right adnexa. Grossly this appears similar in size when compared to prior MRI exams. This is nonspecific in etiology however may be benign given stability over time. Pancolonic diverticulosis. Electronically Signed   By: Lovey Newcomer M.D.   On: 03/17/2016 11:34   Dg Knee Complete 4 Views  Right  Result Date: 03/17/2016 CLINICAL DATA:  Witnessed fall last night.  Pain. EXAM: RIGHT KNEE - COMPLETE 4+ VIEW COMPARISON:  None. FINDINGS: Complete transverse fracture of the patella, displaced a few mm with slight offset. No fracture of the femur, tibia or fibula. Small lipoma hemarthrosis. IMPRESSION: Complete transverse fracture of the mid patella. Electronically Signed   By: Nelson Chimes M.D.   On: 03/17/2016 09:37   Dg Hip Unilat With Pelvis 2-3 Views Right  Result Date: 03/17/2016 CLINICAL DATA:  Witnessed fall last night. EXAM: DG HIP (WITH OR WITHOUT PELVIS) 2-3V RIGHT COMPARISON:  None. FINDINGS: No evidence of pelvic or hip fracture. Extensive pelvic calcifications presumed represent chronic benign uterine disease. Old healed rami fractures on the left. IMPRESSION: No acute finding. Electronically Signed   By: Nelson Chimes M.D.   On: 03/17/2016 09:35   Dg Femur, Min 2 Views Right  Result Date: 03/17/2016 CLINICAL DATA:  Witnessed fall last night.  Pain. EXAM: RIGHT FEMUR 2 VIEWS COMPARISON:  None. FINDINGS: There is no evidence of fracture or other focal bone lesions. Soft tissues are unremarkable. IMPRESSION: Negative. Electronically Signed   By: Nelson Chimes M.D.   On: 03/17/2016 09:36    Assessment/Plan Depression, major Her mood is stable, continue Risperdal 0.5mg ,  Celexa 10mg . Prn Lorazepam 0.5mg  q8h prn utilized 2x in the past 14 days, effective, no observed AR so far, continue, re-evaluate in a month.   Essential hypertension Controlled, continue Metoprolol 12.5mg  bid  Constipation Stable, continue MiraLax daily  Hypothyroidism 12/13/15 TSH 5.99, MMSE 26/30 12/14/15 Levothyroxine 44mcg daily 01/22/16 TSH 3.135  Senile dementia 12/12/15 MMSE 20/30, SNF for continue care needs.    Closed patellar sleeve fracture of left knee Knee immobilizer.   Fracture of multiple ribs rib fractures (left 2 and 3; left 9,11and 12; right 10), 03/21/16 Norco 5/325mg  q6h prn.  Prn Tylenol and Ibuprofen available to her.    Hallucinations Stable, continue Risperdal.   Edema Trace BLE  Generalized anxiety disorder Her mood is stable, continue Risperdal 0.5mg ,  Celexa 10mg . Prn Lorazepam 0.5mg  q8h prn utilized 2x in the past 14 days, effective, no observed AR so far, continue, re-evaluate in a month.    Vitamin B 12 deficiency 04/03/16 Vit B12 986. Hgb 13.1 03/18/16, continue Vit B12  Hypokalemia 03/18/16 Na 137, K 4.0, Bun 24, creat 1.06     Family/ staff Communication: SNF  Labs/tests ordered:  none

## 2016-04-07 NOTE — Assessment & Plan Note (Signed)
04/03/16 Vit B12 986. Hgb 13.1 03/18/16, continue Vit B12

## 2016-04-07 NOTE — Assessment & Plan Note (Signed)
rib fractures (left 2 and 3; left 9,11and 12; right 10), 03/21/16 Norco 5/325mg  q6h prn. Prn Tylenol and Ibuprofen available to her.

## 2016-04-07 NOTE — Assessment & Plan Note (Signed)
Controlled, continue Metoprolol 12.5mg bid.  

## 2016-04-07 NOTE — Assessment & Plan Note (Signed)
Trace BLE 

## 2016-04-07 NOTE — Assessment & Plan Note (Signed)
03/18/16 Na 137, K 4.0, Bun 24, creat 1.06

## 2016-04-07 NOTE — Assessment & Plan Note (Signed)
Stable, continue MiraLax daily.  

## 2016-04-07 NOTE — Assessment & Plan Note (Signed)
Stable, continue Risperdal.  °

## 2016-04-11 DIAGNOSIS — R278 Other lack of coordination: Secondary | ICD-10-CM | POA: Diagnosis not present

## 2016-04-11 DIAGNOSIS — M6281 Muscle weakness (generalized): Secondary | ICD-10-CM | POA: Diagnosis not present

## 2016-04-11 DIAGNOSIS — R262 Difficulty in walking, not elsewhere classified: Secondary | ICD-10-CM | POA: Diagnosis not present

## 2016-04-11 DIAGNOSIS — M20001 Unspecified deformity of right finger(s): Secondary | ICD-10-CM | POA: Diagnosis not present

## 2016-04-11 DIAGNOSIS — R2681 Unsteadiness on feet: Secondary | ICD-10-CM | POA: Diagnosis not present

## 2016-04-13 DIAGNOSIS — R2681 Unsteadiness on feet: Secondary | ICD-10-CM | POA: Diagnosis not present

## 2016-04-13 DIAGNOSIS — M20001 Unspecified deformity of right finger(s): Secondary | ICD-10-CM | POA: Diagnosis not present

## 2016-04-13 DIAGNOSIS — R262 Difficulty in walking, not elsewhere classified: Secondary | ICD-10-CM | POA: Diagnosis not present

## 2016-04-13 DIAGNOSIS — R278 Other lack of coordination: Secondary | ICD-10-CM | POA: Diagnosis not present

## 2016-04-13 DIAGNOSIS — M6281 Muscle weakness (generalized): Secondary | ICD-10-CM | POA: Diagnosis not present

## 2016-04-18 DIAGNOSIS — M20001 Unspecified deformity of right finger(s): Secondary | ICD-10-CM | POA: Diagnosis not present

## 2016-04-18 DIAGNOSIS — R2681 Unsteadiness on feet: Secondary | ICD-10-CM | POA: Diagnosis not present

## 2016-04-18 DIAGNOSIS — R262 Difficulty in walking, not elsewhere classified: Secondary | ICD-10-CM | POA: Diagnosis not present

## 2016-04-18 DIAGNOSIS — M6281 Muscle weakness (generalized): Secondary | ICD-10-CM | POA: Diagnosis not present

## 2016-04-18 DIAGNOSIS — R278 Other lack of coordination: Secondary | ICD-10-CM | POA: Diagnosis not present

## 2016-04-20 DIAGNOSIS — R278 Other lack of coordination: Secondary | ICD-10-CM | POA: Diagnosis not present

## 2016-04-20 DIAGNOSIS — M6281 Muscle weakness (generalized): Secondary | ICD-10-CM | POA: Diagnosis not present

## 2016-04-20 DIAGNOSIS — R2681 Unsteadiness on feet: Secondary | ICD-10-CM | POA: Diagnosis not present

## 2016-04-20 DIAGNOSIS — R262 Difficulty in walking, not elsewhere classified: Secondary | ICD-10-CM | POA: Diagnosis not present

## 2016-04-20 DIAGNOSIS — M20001 Unspecified deformity of right finger(s): Secondary | ICD-10-CM | POA: Diagnosis not present

## 2016-04-25 DIAGNOSIS — M6530 Trigger finger, unspecified finger: Secondary | ICD-10-CM | POA: Diagnosis not present

## 2016-04-25 DIAGNOSIS — R32 Unspecified urinary incontinence: Secondary | ICD-10-CM | POA: Diagnosis not present

## 2016-04-25 DIAGNOSIS — R2681 Unsteadiness on feet: Secondary | ICD-10-CM | POA: Diagnosis not present

## 2016-04-25 DIAGNOSIS — R262 Difficulty in walking, not elsewhere classified: Secondary | ICD-10-CM | POA: Diagnosis not present

## 2016-04-25 DIAGNOSIS — M79643 Pain in unspecified hand: Secondary | ICD-10-CM | POA: Diagnosis not present

## 2016-04-25 DIAGNOSIS — S82034D Nondisplaced transverse fracture of right patella, subsequent encounter for closed fracture with routine healing: Secondary | ICD-10-CM | POA: Diagnosis not present

## 2016-04-25 DIAGNOSIS — I10 Essential (primary) hypertension: Secondary | ICD-10-CM | POA: Diagnosis not present

## 2016-04-25 DIAGNOSIS — F329 Major depressive disorder, single episode, unspecified: Secondary | ICD-10-CM | POA: Diagnosis not present

## 2016-04-25 DIAGNOSIS — R269 Unspecified abnormalities of gait and mobility: Secondary | ICD-10-CM | POA: Diagnosis not present

## 2016-04-25 DIAGNOSIS — G4700 Insomnia, unspecified: Secondary | ICD-10-CM | POA: Diagnosis not present

## 2016-04-25 DIAGNOSIS — R278 Other lack of coordination: Secondary | ICD-10-CM | POA: Diagnosis not present

## 2016-04-25 DIAGNOSIS — M6281 Muscle weakness (generalized): Secondary | ICD-10-CM | POA: Diagnosis not present

## 2016-04-25 DIAGNOSIS — M20001 Unspecified deformity of right finger(s): Secondary | ICD-10-CM | POA: Diagnosis not present

## 2016-04-27 DIAGNOSIS — M20001 Unspecified deformity of right finger(s): Secondary | ICD-10-CM | POA: Diagnosis not present

## 2016-04-27 DIAGNOSIS — M6281 Muscle weakness (generalized): Secondary | ICD-10-CM | POA: Diagnosis not present

## 2016-04-27 DIAGNOSIS — G4700 Insomnia, unspecified: Secondary | ICD-10-CM | POA: Diagnosis not present

## 2016-04-27 DIAGNOSIS — R262 Difficulty in walking, not elsewhere classified: Secondary | ICD-10-CM | POA: Diagnosis not present

## 2016-04-27 DIAGNOSIS — R278 Other lack of coordination: Secondary | ICD-10-CM | POA: Diagnosis not present

## 2016-04-27 DIAGNOSIS — R2681 Unsteadiness on feet: Secondary | ICD-10-CM | POA: Diagnosis not present

## 2016-05-01 DIAGNOSIS — M20001 Unspecified deformity of right finger(s): Secondary | ICD-10-CM | POA: Diagnosis not present

## 2016-05-01 DIAGNOSIS — R278 Other lack of coordination: Secondary | ICD-10-CM | POA: Diagnosis not present

## 2016-05-01 DIAGNOSIS — R2681 Unsteadiness on feet: Secondary | ICD-10-CM | POA: Diagnosis not present

## 2016-05-01 DIAGNOSIS — R262 Difficulty in walking, not elsewhere classified: Secondary | ICD-10-CM | POA: Diagnosis not present

## 2016-05-01 DIAGNOSIS — G4700 Insomnia, unspecified: Secondary | ICD-10-CM | POA: Diagnosis not present

## 2016-05-01 DIAGNOSIS — M6281 Muscle weakness (generalized): Secondary | ICD-10-CM | POA: Diagnosis not present

## 2016-05-03 DIAGNOSIS — R278 Other lack of coordination: Secondary | ICD-10-CM | POA: Diagnosis not present

## 2016-05-03 DIAGNOSIS — M20001 Unspecified deformity of right finger(s): Secondary | ICD-10-CM | POA: Diagnosis not present

## 2016-05-03 DIAGNOSIS — R262 Difficulty in walking, not elsewhere classified: Secondary | ICD-10-CM | POA: Diagnosis not present

## 2016-05-03 DIAGNOSIS — G4700 Insomnia, unspecified: Secondary | ICD-10-CM | POA: Diagnosis not present

## 2016-05-03 DIAGNOSIS — M6281 Muscle weakness (generalized): Secondary | ICD-10-CM | POA: Diagnosis not present

## 2016-05-03 DIAGNOSIS — R2681 Unsteadiness on feet: Secondary | ICD-10-CM | POA: Diagnosis not present

## 2016-05-05 DIAGNOSIS — R2681 Unsteadiness on feet: Secondary | ICD-10-CM | POA: Diagnosis not present

## 2016-05-05 DIAGNOSIS — R278 Other lack of coordination: Secondary | ICD-10-CM | POA: Diagnosis not present

## 2016-05-05 DIAGNOSIS — M20001 Unspecified deformity of right finger(s): Secondary | ICD-10-CM | POA: Diagnosis not present

## 2016-05-05 DIAGNOSIS — G4700 Insomnia, unspecified: Secondary | ICD-10-CM | POA: Diagnosis not present

## 2016-05-05 DIAGNOSIS — R262 Difficulty in walking, not elsewhere classified: Secondary | ICD-10-CM | POA: Diagnosis not present

## 2016-05-05 DIAGNOSIS — M6281 Muscle weakness (generalized): Secondary | ICD-10-CM | POA: Diagnosis not present

## 2016-05-07 DIAGNOSIS — M6281 Muscle weakness (generalized): Secondary | ICD-10-CM | POA: Diagnosis not present

## 2016-05-07 DIAGNOSIS — M20001 Unspecified deformity of right finger(s): Secondary | ICD-10-CM | POA: Diagnosis not present

## 2016-05-07 DIAGNOSIS — R278 Other lack of coordination: Secondary | ICD-10-CM | POA: Diagnosis not present

## 2016-05-07 DIAGNOSIS — G4700 Insomnia, unspecified: Secondary | ICD-10-CM | POA: Diagnosis not present

## 2016-05-07 DIAGNOSIS — R262 Difficulty in walking, not elsewhere classified: Secondary | ICD-10-CM | POA: Diagnosis not present

## 2016-05-07 DIAGNOSIS — R2681 Unsteadiness on feet: Secondary | ICD-10-CM | POA: Diagnosis not present

## 2016-05-09 ENCOUNTER — Non-Acute Institutional Stay (SKILLED_NURSING_FACILITY): Payer: Medicare Other | Admitting: Nurse Practitioner

## 2016-05-09 ENCOUNTER — Encounter: Payer: Self-pay | Admitting: Nurse Practitioner

## 2016-05-09 DIAGNOSIS — E039 Hypothyroidism, unspecified: Secondary | ICD-10-CM

## 2016-05-09 DIAGNOSIS — F0390 Unspecified dementia without behavioral disturbance: Secondary | ICD-10-CM | POA: Diagnosis not present

## 2016-05-09 DIAGNOSIS — R443 Hallucinations, unspecified: Secondary | ICD-10-CM | POA: Diagnosis not present

## 2016-05-09 DIAGNOSIS — E538 Deficiency of other specified B group vitamins: Secondary | ICD-10-CM | POA: Diagnosis not present

## 2016-05-09 DIAGNOSIS — S2242XD Multiple fractures of ribs, left side, subsequent encounter for fracture with routine healing: Secondary | ICD-10-CM | POA: Diagnosis not present

## 2016-05-09 DIAGNOSIS — S82092A Other fracture of left patella, initial encounter for closed fracture: Secondary | ICD-10-CM

## 2016-05-09 DIAGNOSIS — F321 Major depressive disorder, single episode, moderate: Secondary | ICD-10-CM

## 2016-05-09 DIAGNOSIS — K5900 Constipation, unspecified: Secondary | ICD-10-CM

## 2016-05-09 DIAGNOSIS — I10 Essential (primary) hypertension: Secondary | ICD-10-CM | POA: Diagnosis not present

## 2016-05-09 DIAGNOSIS — E876 Hypokalemia: Secondary | ICD-10-CM | POA: Diagnosis not present

## 2016-05-09 DIAGNOSIS — R609 Edema, unspecified: Secondary | ICD-10-CM

## 2016-05-09 NOTE — Assessment & Plan Note (Signed)
Controlled, continue Metoprolol 12.5mg bid.  

## 2016-05-09 NOTE — Assessment & Plan Note (Signed)
Healing. rib fractures (left 2 and 3; left 9,11and 12; right 10), 03/21/16 Norco 5/325mg  q6h prn. Prn Tylenol and Ibuprofen available to her.

## 2016-05-09 NOTE — Assessment & Plan Note (Signed)
Stable, continue MiraLax daily.  

## 2016-05-09 NOTE — Assessment & Plan Note (Signed)
12/13/15 TSH 5.99, MMSE 26/30 12/14/15 Levothyroxine 55mcg daily 01/22/16 TSH 3.135

## 2016-05-09 NOTE — Assessment & Plan Note (Signed)
Trace BLE 

## 2016-05-09 NOTE — Progress Notes (Signed)
Location:  [facility] Room Number: 12 Place of Service:  SNF (31) Provider:  Jacobo Moncrief, Manxie  NP  Jeanmarie Hubert, MD  Patient Care Team: Estill Dooms, MD as PCP - General (Internal Medicine) [facility] Gaynelle Arabian, MD as Consulting Physician (Orthopedic Surgery) Tillman Kazmierski Otho Darner, NP as Nurse Practitioner (Internal Medicine)  Extended Emergency Contact Information Primary Emergency Contact: Peri [place] Mobile Phone: [phone] Relation: Daughter Secondary Emergency Contact: Kerby Less [place] Phone: [phone] Relation: Daughter  Code Status:  DNR Goals of care: Advanced Directive information Advanced Directives 05/09/2016  Does Patient Have a Medical Advance Directive? Yes  Type of Paramedic of [place];Out of facility DNR (pink MOST or yellow form)  Does patient want to make changes to medical advance directive? No - Patient declined  Copy of [place] in Chart? Yes  Pre-existing out of facility DNR order (yellow form or pink MOST form) Yellow form placed in chart (order not valid for inpatient use)     Chief Complaint  Patient presents with  . Medical Management of Chronic Issues    HPI:  Pt is a 83 y.o. female seen today for medical management of chronic diseases.      Anxiety/hallucination, stabletaking Risperdal 0.5mg ,  Celexa 10mg ,                           hospitalization 03/17/16 to 03/19/16. She had a fall and sustained patellar fx of the right knee and rib fractures (left 2 and 3; left 9,11and 12; right 10). Prn Tylenol, Norco, Ibuprofen are available to her for pain.  Hx of hallucination, insomnia, anxiety, stable while on Risperdal 0.5mg  qd, Celexa 10mg , HTN, takingMetoprolol 12.5mg  bidl. Chronic edema BLE is minimal, off Furosemide 20mg . TSH 5.99 12/13/15, started Levothyroxine 19mcg TSH 3.135 01/22/16  Past Medical History:    Diagnosis Date  . Abnormality of gait 03/15/2010  . Anxiety state, unspecified 03/15/2010  . Depression, major 06/15/2015  . Depressive disorder, not elsewhere classified 03/15/2010  . Dysphagia, unspecified(787.20) 03/15/2010  . Generalized anxiety disorder 06/23/2014  . Herpes zoster without mention of complication 2/72/5366  . Other alteration of consciousness 03/15/2010  . Pain in hand 08/13/12  . Personal history of fall 03/15/2010  . Senile osteoporosis 03/15/2010  . Unspecified constipation 03/15/2010  . Unspecified essential hypertension 03/15/2010  . Unspecified hearing loss 08/22/2011  . Unspecified urinary incontinence 03/15/2010  . Urinary incontinence 03/15/2010  . Vaginal prolapse 06/15/2015   Past Surgical History:  Procedure Laterality Date  . INCISION / DRAINAGE HAND / FINGER Right 06/2008   hand  . SPINE SURGERY  12/2006    Allergies  Allergen Reactions  . Ace Inhibitors Other (See Comments)    Reaction unknown/ listed on MAR  . Hydrochlorothiazide Other (See Comments)    Reaction unknown/ listed on MAR  . Morphine And Related Other (See Comments)    Hallucinations    Outpatient Encounter Prescriptions as of 05/09/2016  Medication Sig  . acetaminophen (TYLENOL) 325 MG tablet Take 2 tablets (650 mg total) by mouth every 6 (six) hours as needed for mild pain (or Fever >/= 101).  Marland Kitchen aspirin EC 81 MG tablet Take 1 tablet (81 mg total) by mouth daily.  . Calcium Carbonate-Vitamin D (CALCIUM 600+D) 600-200 MG-UNIT TABS Take 1 tablet by mouth daily.   . citalopram (CELEXA) 10 MG tablet Take one table  once daily  . cyanocobalamin 1000 MCG tablet Take 1,000 mcg by mouth. Take one tablet once daily for Vitamin B-12  . feeding supplement (BOOST / RESOURCE BREEZE) LIQD Take 1 Container by mouth 2 (two) times daily between meals.  Marland Kitchen HYDROcodone-acetaminophen (NORCO/VICODIN) 5-325 MG tablet Take 1 tablet by mouth every 6 (six) hours as needed for moderate pain.  . hydrocortisone  (PROCTO-MED HC) 2.5 % rectal cream Place 1 application rectally every 12 (twelve) hours as needed for hemorrhoids or itching.  Marland Kitchen ibuprofen (ADVIL,MOTRIN) 200 MG tablet Take 400 mg by mouth every 6 (six) hours as needed (for pain).  Marland Kitchen levothyroxine (SYNTHROID, LEVOTHROID) 25 MCG tablet Take 25 mcg by mouth daily before breakfast.  . metoprolol tartrate (LOPRESSOR) 25 MG tablet Take 25 mg by mouth. Take 1/2 tablet (12.5 mg) twice a day  . mineral oil-hydrophilic petrolatum (AQUAPHOR) ointment Apply 1 application topically daily as needed for dry skin.  . polyethylene glycol (MIRALAX / GLYCOLAX) packet Take 17 g by mouth daily.  . risperiDONE (RISPERDAL) 0.5 MG tablet Take 0.5 mg by mouth. Take one tablet at bedtime  . [DISCONTINUED] LORazepam (ATIVAN) 0.5 MG tablet Take 0.5 mg by mouth every 8 (eight) hours as needed for anxiety.   No facility-administered encounter medications on file as of 05/09/2016.     Review of Systems  Constitutional: Negative for activity change, appetite change and fatigue.  HENT: Positive for hearing loss. Negative for ear pain.   Eyes: Negative.   Respiratory: Negative.  Negative for shortness of breath.   Cardiovascular: Positive for leg swelling. Negative for chest pain and palpitations.       HTN  Gastrointestinal: Negative.   Endocrine: Negative.   Genitourinary:       Incontinence  Musculoskeletal: Positive for arthralgias (in feet).       Pain in both hands: R>L. Trigger fingers at roght 3rd and 4th. Contracted into palm most of the time. Right patellar fracture. Right leg, off long leg Velcro closure splint.  Skin: Negative for rash (Erythematous rash of the lower legs nonspecific nature).       Resolving the right peri orbital ecchymosis   Neurological: Negative.   Hematological: Negative.   Psychiatric/Behavioral: Positive for confusion and sleep disturbance (Wakes at 3 AM. Full of energy then. Uses Motrin PM to get back to sleep. ). The patient is  nervous/anxious.        Hx of hallucinations.    Immunization History  Administered Date(s) Administered  . Influenza Whole 10/24/2011, 10/24/2012  . Influenza-Unspecified 11/06/2013, 11/04/2015  . PPD Test 01/11/2015  . Pneumococcal Polysaccharide-23 01/23/2009  . Td 01/23/2009   Pertinent  Health Maintenance Due  Topic Date Due  . DEXA SCAN  07/26/1984  . PNA vac Low Risk Adult (2 of 2 - PCV13) 01/23/2010  . INFLUENZA VACCINE  08/23/2016   Fall Risk  03/20/2016 09/21/2015 06/15/2015 01/05/2015 12/29/2014  Falls in the past year? Yes No No No No  Number falls in past yr: - - - - -  Injury with Fall? Yes - - - -  Risk Factor Category  High Fall Risk - - - -   Functional Status Survey:    Vitals:   05/09/16 1220  BP: 126/70  Pulse: 64  Resp: 20  Temp: 98.4 F (36.9 C)  Weight: 116 lb 12.8 oz (53 kg)  Height: 5\' 2"  (1.575 m)   Body mass index is 21.36 kg/m. Physical Exam  Constitutional: She appears well-developed and well-nourished.  No distress.  HENT:  Head: Normocephalic and atraumatic.  Severe hearing loss bilaterally. Wearing hearing aids.  Eyes: Conjunctivae and EOM are normal. Pupils are equal, round, and reactive to light.  Neck: Normal range of motion. Neck supple. No JVD present. No tracheal deviation present. No thyromegaly present.  Cardiovascular: Normal rate, regular rhythm and normal heart sounds.  Exam reveals no gallop and no friction rub.   No murmur heard. Pulmonary/Chest: No respiratory distress. She has no wheezes. She has no rales. She exhibits no tenderness.  Abdominal: Bowel sounds are normal. She exhibits no distension and no mass. There is no tenderness.  Genitourinary: Rectal exam shows guaiac negative stool.  Genitourinary Comments: Examined 06/15/15: Firm area at the top of the vagina. Prolapse of the vaginal walls. Loss of rectal sphincter tone.  Musculoskeletal: Normal range of motion. She exhibits edema. She exhibits no tenderness.  Pain  in hands. Right 3rd and 4th trigger finges with contracturesr. Uses walker due to unstable gait. Left 2nd toe is deviated to the great toe and has elevated DIP joint that rubs on her shoe. Trace edema R+L ankles Right leg, off long leg splint with Velcro closure.  Lymphadenopathy:    She has no cervical adenopathy.  Neurological: She is alert. No cranial nerve deficit. Coordination normal.  12/29/14 MMSE 26/30 . Failed clock drawing.  Skin: No rash (Nonspecific erythematous rash in both lower legs.) noted. No erythema. No pallor.  Resolving the right peri orbital ecchymosis   Psychiatric:  Reticent. Stares, but does not always respond.    Labs reviewed:  Recent Labs  01/21/16 1815 01/22/16 0532 01/27/16 03/17/16 0907 03/18/16 0847  NA  --  137 138 139 137  K  --  4.2 4.1 3.3* 4.0  CL  --  103  --  102 102  CO2  --  27  --  29 26  GLUCOSE  --  85  --  97 110*  BUN  --  25* 19 17 24*  CREATININE  --  0.96 0.8 0.62 1.06*  CALCIUM  --  8.6*  --  9.1 9.0  MG 1.9  --   --   --   --     Recent Labs  01/21/16 1412 03/17/16 0907  AST 19 18  ALT 16 13*  ALKPHOS 62 59  BILITOT 0.8 1.1  PROT 6.4* 6.9  ALBUMIN 3.4* 4.2    Recent Labs  11/10/15 1452 01/21/16 1412 01/22/16 0532 01/27/16 03/17/16 0907 03/18/16 0431  WBC 8.1 10.4 7.5 5.8 10.0 11.5*  NEUTROABS 5.8 8.0*  --   --  6.8  --   HGB 13.1 12.0 11.9* 11.3* 13.8 13.1  HCT 38.5 35.5* 35.3* 34* 40.5 38.5  MCV 98.2 98.3 97.8  --  96.4 96.3  PLT 343 363 327 340 337 355   Lab Results  Component Value Date   TSH 3.135 01/22/2016   Lab Results  Component Value Date   HGBA1C 5.6 12/11/2014   Lab Results  Component Value Date   CHOL 178 12/11/2014   HDL 60 12/11/2014   LDLCALC 97 12/11/2014   TRIG 106 12/11/2014   CHOLHDL 3.0 12/11/2014    Significant Diagnostic Results in last 30 days:  No results found.  Assessment/Plan Essential hypertension Controlled, continue Metoprolol 12.5mg   bid  Constipation Stable, continue MiraLax daily  Hypothyroidism 12/13/15 TSH 5.99, MMSE 26/30 12/14/15 Levothyroxine 27mcg daily 01/22/16 TSH 3.135  Senile dementia 12/12/15 MMSE 20/30, SNF for continue care needs.  Closed patellar sleeve fracture of left knee Healing nicely, f/u Ortho  Fracture of multiple ribs Healing. rib fractures (left 2 and 3; left 9,11and 12; right 10), 03/21/16 Norco 5/325mg  q6h prn. Prn Tylenol and Ibuprofen available to her.    Hallucinations Stable, continue Risperdal.   Edema Trace BLE  Depression, major Her mood is stable, continue Risperdal 0.5mg ,  Celexa 10mg .   Hypokalemia K 4.1 01/27/16  Vitamin B 12 deficiency 04/03/16 Vit B12 986. Hgb 13.1 03/18/16, continue Vit B12     Family/ staff Communication: SNF  Labs/tests ordered:  none

## 2016-05-09 NOTE — Assessment & Plan Note (Signed)
12/12/15 MMSE 20/30, SNF for continue care needs.

## 2016-05-09 NOTE — Assessment & Plan Note (Signed)
K 4.1 01/27/16

## 2016-05-09 NOTE — Assessment & Plan Note (Signed)
04/03/16 Vit B12 986. Hgb 13.1 03/18/16, continue Vit B12

## 2016-05-09 NOTE — Assessment & Plan Note (Signed)
Healing nicely, f/u Ortho

## 2016-05-09 NOTE — Assessment & Plan Note (Signed)
Stable, continue Risperdal.  °

## 2016-05-09 NOTE — Assessment & Plan Note (Signed)
Her mood is stable, continue Risperdal 0.5mg ,  Celexa 10mg .

## 2016-05-10 DIAGNOSIS — R278 Other lack of coordination: Secondary | ICD-10-CM | POA: Diagnosis not present

## 2016-05-10 DIAGNOSIS — M6281 Muscle weakness (generalized): Secondary | ICD-10-CM | POA: Diagnosis not present

## 2016-05-10 DIAGNOSIS — M20001 Unspecified deformity of right finger(s): Secondary | ICD-10-CM | POA: Diagnosis not present

## 2016-05-10 DIAGNOSIS — G4700 Insomnia, unspecified: Secondary | ICD-10-CM | POA: Diagnosis not present

## 2016-05-10 DIAGNOSIS — R2681 Unsteadiness on feet: Secondary | ICD-10-CM | POA: Diagnosis not present

## 2016-05-10 DIAGNOSIS — R262 Difficulty in walking, not elsewhere classified: Secondary | ICD-10-CM | POA: Diagnosis not present

## 2016-05-12 DIAGNOSIS — M20001 Unspecified deformity of right finger(s): Secondary | ICD-10-CM | POA: Diagnosis not present

## 2016-05-12 DIAGNOSIS — G4700 Insomnia, unspecified: Secondary | ICD-10-CM | POA: Diagnosis not present

## 2016-05-12 DIAGNOSIS — M6281 Muscle weakness (generalized): Secondary | ICD-10-CM | POA: Diagnosis not present

## 2016-05-12 DIAGNOSIS — R2681 Unsteadiness on feet: Secondary | ICD-10-CM | POA: Diagnosis not present

## 2016-05-12 DIAGNOSIS — R278 Other lack of coordination: Secondary | ICD-10-CM | POA: Diagnosis not present

## 2016-05-12 DIAGNOSIS — R262 Difficulty in walking, not elsewhere classified: Secondary | ICD-10-CM | POA: Diagnosis not present

## 2016-05-15 DIAGNOSIS — R278 Other lack of coordination: Secondary | ICD-10-CM | POA: Diagnosis not present

## 2016-05-15 DIAGNOSIS — R262 Difficulty in walking, not elsewhere classified: Secondary | ICD-10-CM | POA: Diagnosis not present

## 2016-05-15 DIAGNOSIS — M6281 Muscle weakness (generalized): Secondary | ICD-10-CM | POA: Diagnosis not present

## 2016-05-15 DIAGNOSIS — G4700 Insomnia, unspecified: Secondary | ICD-10-CM | POA: Diagnosis not present

## 2016-05-15 DIAGNOSIS — R2681 Unsteadiness on feet: Secondary | ICD-10-CM | POA: Diagnosis not present

## 2016-05-15 DIAGNOSIS — M20001 Unspecified deformity of right finger(s): Secondary | ICD-10-CM | POA: Diagnosis not present

## 2016-05-17 DIAGNOSIS — M6281 Muscle weakness (generalized): Secondary | ICD-10-CM | POA: Diagnosis not present

## 2016-05-17 DIAGNOSIS — R262 Difficulty in walking, not elsewhere classified: Secondary | ICD-10-CM | POA: Diagnosis not present

## 2016-05-17 DIAGNOSIS — R2681 Unsteadiness on feet: Secondary | ICD-10-CM | POA: Diagnosis not present

## 2016-05-17 DIAGNOSIS — M20001 Unspecified deformity of right finger(s): Secondary | ICD-10-CM | POA: Diagnosis not present

## 2016-05-17 DIAGNOSIS — G4700 Insomnia, unspecified: Secondary | ICD-10-CM | POA: Diagnosis not present

## 2016-05-17 DIAGNOSIS — R278 Other lack of coordination: Secondary | ICD-10-CM | POA: Diagnosis not present

## 2016-05-19 DIAGNOSIS — R2681 Unsteadiness on feet: Secondary | ICD-10-CM | POA: Diagnosis not present

## 2016-05-19 DIAGNOSIS — M6281 Muscle weakness (generalized): Secondary | ICD-10-CM | POA: Diagnosis not present

## 2016-05-19 DIAGNOSIS — R278 Other lack of coordination: Secondary | ICD-10-CM | POA: Diagnosis not present

## 2016-05-19 DIAGNOSIS — M20001 Unspecified deformity of right finger(s): Secondary | ICD-10-CM | POA: Diagnosis not present

## 2016-05-19 DIAGNOSIS — R262 Difficulty in walking, not elsewhere classified: Secondary | ICD-10-CM | POA: Diagnosis not present

## 2016-05-19 DIAGNOSIS — G4700 Insomnia, unspecified: Secondary | ICD-10-CM | POA: Diagnosis not present

## 2016-05-22 DIAGNOSIS — R278 Other lack of coordination: Secondary | ICD-10-CM | POA: Diagnosis not present

## 2016-05-22 DIAGNOSIS — M6281 Muscle weakness (generalized): Secondary | ICD-10-CM | POA: Diagnosis not present

## 2016-05-22 DIAGNOSIS — G4700 Insomnia, unspecified: Secondary | ICD-10-CM | POA: Diagnosis not present

## 2016-05-22 DIAGNOSIS — R2681 Unsteadiness on feet: Secondary | ICD-10-CM | POA: Diagnosis not present

## 2016-05-22 DIAGNOSIS — R262 Difficulty in walking, not elsewhere classified: Secondary | ICD-10-CM | POA: Diagnosis not present

## 2016-05-22 DIAGNOSIS — M20001 Unspecified deformity of right finger(s): Secondary | ICD-10-CM | POA: Diagnosis not present

## 2016-05-23 DIAGNOSIS — S82034D Nondisplaced transverse fracture of right patella, subsequent encounter for closed fracture with routine healing: Secondary | ICD-10-CM | POA: Diagnosis not present

## 2016-05-24 DIAGNOSIS — R269 Unspecified abnormalities of gait and mobility: Secondary | ICD-10-CM | POA: Diagnosis not present

## 2016-05-24 DIAGNOSIS — R479 Unspecified speech disturbances: Secondary | ICD-10-CM | POA: Diagnosis not present

## 2016-05-24 DIAGNOSIS — R32 Unspecified urinary incontinence: Secondary | ICD-10-CM | POA: Diagnosis not present

## 2016-05-24 DIAGNOSIS — F329 Major depressive disorder, single episode, unspecified: Secondary | ICD-10-CM | POA: Diagnosis not present

## 2016-05-24 DIAGNOSIS — I10 Essential (primary) hypertension: Secondary | ICD-10-CM | POA: Diagnosis not present

## 2016-05-24 DIAGNOSIS — M6530 Trigger finger, unspecified finger: Secondary | ICD-10-CM | POA: Diagnosis not present

## 2016-05-24 DIAGNOSIS — M79643 Pain in unspecified hand: Secondary | ICD-10-CM | POA: Diagnosis not present

## 2016-05-24 DIAGNOSIS — G4700 Insomnia, unspecified: Secondary | ICD-10-CM | POA: Diagnosis not present

## 2016-05-24 DIAGNOSIS — F411 Generalized anxiety disorder: Secondary | ICD-10-CM | POA: Diagnosis not present

## 2016-05-24 DIAGNOSIS — E876 Hypokalemia: Secondary | ICD-10-CM | POA: Diagnosis not present

## 2016-05-24 DIAGNOSIS — H9190 Unspecified hearing loss, unspecified ear: Secondary | ICD-10-CM | POA: Diagnosis not present

## 2016-05-24 DIAGNOSIS — G459 Transient cerebral ischemic attack, unspecified: Secondary | ICD-10-CM | POA: Diagnosis not present

## 2016-05-25 ENCOUNTER — Non-Acute Institutional Stay (SKILLED_NURSING_FACILITY): Payer: Medicare Other | Admitting: Internal Medicine

## 2016-05-25 ENCOUNTER — Encounter: Payer: Self-pay | Admitting: Internal Medicine

## 2016-05-25 DIAGNOSIS — I10 Essential (primary) hypertension: Secondary | ICD-10-CM

## 2016-05-25 DIAGNOSIS — F0390 Unspecified dementia without behavioral disturbance: Secondary | ICD-10-CM

## 2016-05-25 DIAGNOSIS — R443 Hallucinations, unspecified: Secondary | ICD-10-CM | POA: Diagnosis not present

## 2016-05-25 DIAGNOSIS — E039 Hypothyroidism, unspecified: Secondary | ICD-10-CM | POA: Diagnosis not present

## 2016-05-25 DIAGNOSIS — F321 Major depressive disorder, single episode, moderate: Secondary | ICD-10-CM | POA: Diagnosis not present

## 2016-05-25 DIAGNOSIS — S82092A Other fracture of left patella, initial encounter for closed fracture: Secondary | ICD-10-CM

## 2016-05-25 NOTE — Progress Notes (Signed)
Progress Note    Location:  [place] Room Number: [place] of Service:  SNF [phone]) Provider:  Jeanmarie Hubert, MD  Patient Care Team: Estill Dooms, MD as PCP - General (Internal Medicine) [facility] Gaynelle Arabian, MD as Consulting Physician (Orthopedic Surgery) Man Otho Darner, NP as Nurse Practitioner (Internal Medicine)  Extended Emergency Contact Information Primary Emergency Contact: Peri Jefferson [place] of [place] Mobile Phone: [phone] Relation: Daughter Secondary Emergency Contact: Kerby Less [place] of [place] Phone: [phone] Relation: Daughter  Code Status:  DNR Goals of care: Advanced Directive information Advanced Directives 05/25/2016  Does Patient Have a Medical Advance Directive? Yes  Type of Paramedic of [place];Living will;Out of facility DNR (pink MOST or yellow form)  Does patient want to make changes to medical advance directive? -  Copy of [place] in Chart? Yes  Pre-existing out of facility DNR order (yellow form or pink MOST form) Yellow form placed in chart (order not valid for inpatient use)     Chief Complaint  Patient presents with  . Medical Management of Chronic Issues    90 day visit    HPI:  Pt is a 83 y.o. female seen today for medical management of chronic diseases.    Was admitted to [facility] 03/19/16 with fx patella and ribs. She is doing better. Splint of the right knee has been removed under the guidance of ortho. She is progressing with PT.  Essential hypertension - controlled  Current moderate episode of major depressive disorder without prior episode (HCC) - stable on current meds  Hallucinations - seems resolved  Hypothyroidism, unspecified type - compensated  Senile dementia without behavioral disturbance - slowly progressive.   Past Medical History:  Diagnosis Date  . Abnormality of gait 03/15/2010  . Anxiety state,  unspecified 03/15/2010  . Depression, major 06/15/2015  . Depressive disorder, not elsewhere classified 03/15/2010  . Dysphagia, unspecified(787.20) 03/15/2010  . Generalized anxiety disorder 06/23/2014  . Herpes zoster without mention of complication 5/57/3220  . Other alteration of consciousness 03/15/2010  . Pain in hand 08/13/12  . Personal history of fall 03/15/2010  . Senile osteoporosis 03/15/2010  . Unspecified constipation 03/15/2010  . Unspecified essential hypertension 03/15/2010  . Unspecified hearing loss 08/22/2011  . Unspecified urinary incontinence 03/15/2010  . Urinary incontinence 03/15/2010  . Vaginal prolapse 06/15/2015   Past Surgical History:  Procedure Laterality Date  . INCISION / DRAINAGE HAND / FINGER Right 06/2008   hand  . SPINE SURGERY  12/2006    Allergies  Allergen Reactions  . Ace Inhibitors Other (See Comments)    Reaction unknown/ listed on MAR  . Hydrochlorothiazide Other (See Comments)    Reaction unknown/ listed on MAR  . Morphine And Related Other (See Comments)    Hallucinations    Outpatient Encounter Prescriptions as of 05/25/2016  Medication Sig  . acetaminophen (TYLENOL) 325 MG tablet Take 2 tablets (650 mg total) by mouth every 6 (six) hours as needed for mild pain (or Fever >/= 101).  Marland Kitchen aspirin EC 81 MG tablet Take 1 tablet (81 mg total) by mouth daily.  . Calcium Carbonate-Vitamin D (CALCIUM 600+D) 600-200 MG-UNIT TABS Take 1 tablet by mouth daily.   . citalopram (CELEXA) 10 MG tablet Take one table once daily  . cyanocobalamin 1000 MCG tablet Take 1,000 mcg by mouth. Take one tablet once daily for Vitamin B-12  . feeding supplement (BOOST / RESOURCE BREEZE)  LIQD Take 1 Container by mouth 2 (two) times daily between meals.  Marland Kitchen HYDROcodone-acetaminophen (NORCO/VICODIN) 5-325 MG tablet Take 1 tablet by mouth. Take one every 6 hours as needed. Take one at bedtime  . hydrocortisone (PROCTO-MED HC) 2.5 % rectal cream Place 1 application rectally every  12 (twelve) hours as needed for hemorrhoids or itching.  Marland Kitchen ibuprofen (ADVIL,MOTRIN) 200 MG tablet Take 400 mg by mouth every 6 (six) hours as needed (for pain).  Marland Kitchen levothyroxine (SYNTHROID, LEVOTHROID) 25 MCG tablet Take 25 mcg by mouth daily before breakfast.  . LORazepam (ATIVAN) 0.5 MG tablet Take 0.5 mg by mouth. Take one tablet every 8 hours as needed for anxiety  . metoprolol tartrate (LOPRESSOR) 25 MG tablet Take 25 mg by mouth. Take 1/2 tablet (12.5 mg) twice a day  . mineral oil-hydrophilic petrolatum (AQUAPHOR) ointment Apply 1 application topically daily as needed for dry skin.  . polyethylene glycol (MIRALAX / GLYCOLAX) packet Take 17 g by mouth daily.  . risperiDONE (RISPERDAL) 0.5 MG tablet Take 0.5 mg by mouth. Take one tablet at bedtime   No facility-administered encounter medications on file as of 05/25/2016.     Review of Systems  Constitutional: Negative for activity change, appetite change and fatigue.  HENT: Positive for hearing loss. Negative for ear pain.   Eyes: Negative.   Respiratory: Negative.  Negative for shortness of breath.   Cardiovascular: Positive for leg swelling. Negative for chest pain and palpitations.       HTN  Gastrointestinal: Negative.   Endocrine: Negative.   Genitourinary:       Incontinence  Musculoskeletal: Positive for arthralgias (in feet).       Pain in both hands: R>L. Trigger fingers at roght 3rd and 4th. Contracted into palm most of the time. Right patellar fracture. Right leg, off long leg Velcro closure splint.  Skin: Negative for rash (Erythematous rash of the lower legs nonspecific nature).       Resolving the right peri orbital ecchymosis   Neurological: Negative.   Hematological: Negative.   Psychiatric/Behavioral: Positive for confusion. Negative for sleep disturbance. The patient is not nervous/anxious.        Hx of hallucinations.    Immunization History  Administered Date(s) Administered  . Influenza Whole 10/24/2011,  10/24/2012  . Influenza-Unspecified 11/06/2013, 11/04/2015  . PPD Test 01/11/2015  . Pneumococcal Polysaccharide-23 01/23/2009  . Td 01/23/2009   Pertinent  Health Maintenance Due  Topic Date Due  . PNA vac Low Risk Adult (2 of 2 - PCV13) 01/23/2010  . DEXA SCAN  01/24/2023 (Originally 07/26/1984)  . INFLUENZA VACCINE  08/23/2016   Fall Risk  03/20/2016 09/21/2015 06/15/2015 01/05/2015 12/29/2014  Falls in the past year? Yes No No No No  Number falls in past yr: - - - - -  Injury with Fall? Yes - - - -  Risk Factor Category  High Fall Risk - - - -     Vitals:   05/25/16 1446  BP: 124/76  Pulse: 62  Resp: 20  Temp: 97.4 F (36.3 C)  SpO2: 93%  Weight: 116 lb 12.8 oz (53 kg)  Height: 5\' 2"  (1.575 m)   Body mass index is 21.36 kg/m. Physical Exam  Constitutional: She appears well-developed and well-nourished. No distress.  HENT:  Head: Normocephalic and atraumatic.  Severe hearing loss bilaterally. Wearing hearing aids.  Eyes: Conjunctivae and EOM are normal. Pupils are equal, round, and reactive to light.  Neck: Normal range of motion. Neck supple.  No JVD present. No tracheal deviation present. No thyromegaly present.  Cardiovascular: Normal rate, regular rhythm and normal heart sounds.  Exam reveals no gallop and no friction rub.   No murmur heard. Pulmonary/Chest: No respiratory distress. She has no wheezes. She has no rales. She exhibits no tenderness.  Abdominal: Bowel sounds are normal. She exhibits no distension and no mass. There is no tenderness.  Genitourinary: Rectal exam shows guaiac negative stool.  Genitourinary Comments: Examined 06/15/15: Firm area at the top of the vagina. Prolapse of the vaginal walls. Loss of rectal sphincter tone.  Musculoskeletal: Normal range of motion. She exhibits edema. She exhibits no tenderness.  Pain in hands. Right 3rd and 4th trigger finges with contracturesr. Uses walker due to unstable gait. Left 2nd toe is deviated to the great  toe and has elevated DIP joint that rubs on her shoe. Trace edema R+L ankles Right leg, off long leg splint with Velcro closure.  Lymphadenopathy:    She has no cervical adenopathy.  Neurological: She is alert. No cranial nerve deficit. Coordination normal.  12/29/14 MMSE 26/30 . Failed clock drawing.  Skin: No rash (Nonspecific erythematous rash in both lower legs.) noted. No erythema. No pallor.  Resolving the right peri orbital ecchymosis   Psychiatric:  Reticent. Stares, but does not always respond.    Labs reviewed:  Recent Labs  01/21/16 1815 01/22/16 0532 01/27/16 03/17/16 0907 03/18/16 0847  NA  --  137 138 139 137  K  --  4.2 4.1 3.3* 4.0  CL  --  103  --  102 102  CO2  --  27  --  29 26  GLUCOSE  --  85  --  97 110*  BUN  --  25* 19 17 24*  CREATININE  --  0.96 0.8 0.62 1.06*  CALCIUM  --  8.6*  --  9.1 9.0  MG 1.9  --   --   --   --     Recent Labs  01/21/16 1412 03/17/16 0907  AST 19 18  ALT 16 13*  ALKPHOS 62 59  BILITOT 0.8 1.1  PROT 6.4* 6.9  ALBUMIN 3.4* 4.2    Recent Labs  11/10/15 1452 01/21/16 1412 01/22/16 0532 01/27/16 03/17/16 0907 03/18/16 0431  WBC 8.1 10.4 7.5 5.8 10.0 11.5*  NEUTROABS 5.8 8.0*  --   --  6.8  --   HGB 13.1 12.0 11.9* 11.3* 13.8 13.1  HCT 38.5 35.5* 35.3* 34* 40.5 38.5  MCV 98.2 98.3 97.8  --  96.4 96.3  PLT 343 363 327 340 337 355   Lab Results  Component Value Date   TSH 3.135 01/22/2016   Lab Results  Component Value Date   HGBA1C 5.6 12/11/2014   Lab Results  Component Value Date   CHOL 178 12/11/2014   HDL 60 12/11/2014   LDLCALC 97 12/11/2014   TRIG 106 12/11/2014   CHOLHDL 3.0 12/11/2014    Significant Diagnostic Results in last 30 days:  No results found.  Assessment/Plan  1. Closed sleeve fracture of left patella, initial encounter improving  2. Essential hypertension controlled  3. Current moderate episode of major depressive disorder without prior episode [facility]) The current medical  regimen is effective;  continue present plan and medications.  4. Hallucinations inactive  5. Hypothyroidism compensated   6. Senile dementia without behavioral disturbance stable

## 2016-05-29 DIAGNOSIS — H9190 Unspecified hearing loss, unspecified ear: Secondary | ICD-10-CM | POA: Diagnosis not present

## 2016-05-29 DIAGNOSIS — G4700 Insomnia, unspecified: Secondary | ICD-10-CM | POA: Diagnosis not present

## 2016-05-29 DIAGNOSIS — F411 Generalized anxiety disorder: Secondary | ICD-10-CM | POA: Diagnosis not present

## 2016-05-29 DIAGNOSIS — E876 Hypokalemia: Secondary | ICD-10-CM | POA: Diagnosis not present

## 2016-05-29 DIAGNOSIS — G459 Transient cerebral ischemic attack, unspecified: Secondary | ICD-10-CM | POA: Diagnosis not present

## 2016-05-29 DIAGNOSIS — R479 Unspecified speech disturbances: Secondary | ICD-10-CM | POA: Diagnosis not present

## 2016-05-30 DIAGNOSIS — R479 Unspecified speech disturbances: Secondary | ICD-10-CM | POA: Diagnosis not present

## 2016-05-30 DIAGNOSIS — G4700 Insomnia, unspecified: Secondary | ICD-10-CM | POA: Diagnosis not present

## 2016-05-30 DIAGNOSIS — F411 Generalized anxiety disorder: Secondary | ICD-10-CM | POA: Diagnosis not present

## 2016-05-30 DIAGNOSIS — G459 Transient cerebral ischemic attack, unspecified: Secondary | ICD-10-CM | POA: Diagnosis not present

## 2016-05-30 DIAGNOSIS — E876 Hypokalemia: Secondary | ICD-10-CM | POA: Diagnosis not present

## 2016-05-30 DIAGNOSIS — H9190 Unspecified hearing loss, unspecified ear: Secondary | ICD-10-CM | POA: Diagnosis not present

## 2016-05-31 DIAGNOSIS — E876 Hypokalemia: Secondary | ICD-10-CM | POA: Diagnosis not present

## 2016-05-31 DIAGNOSIS — H9190 Unspecified hearing loss, unspecified ear: Secondary | ICD-10-CM | POA: Diagnosis not present

## 2016-05-31 DIAGNOSIS — G4700 Insomnia, unspecified: Secondary | ICD-10-CM | POA: Diagnosis not present

## 2016-05-31 DIAGNOSIS — R479 Unspecified speech disturbances: Secondary | ICD-10-CM | POA: Diagnosis not present

## 2016-05-31 DIAGNOSIS — G459 Transient cerebral ischemic attack, unspecified: Secondary | ICD-10-CM | POA: Diagnosis not present

## 2016-05-31 DIAGNOSIS — F411 Generalized anxiety disorder: Secondary | ICD-10-CM | POA: Diagnosis not present

## 2016-06-01 DIAGNOSIS — G459 Transient cerebral ischemic attack, unspecified: Secondary | ICD-10-CM | POA: Diagnosis not present

## 2016-06-01 DIAGNOSIS — F411 Generalized anxiety disorder: Secondary | ICD-10-CM | POA: Diagnosis not present

## 2016-06-01 DIAGNOSIS — G4700 Insomnia, unspecified: Secondary | ICD-10-CM | POA: Diagnosis not present

## 2016-06-01 DIAGNOSIS — H9190 Unspecified hearing loss, unspecified ear: Secondary | ICD-10-CM | POA: Diagnosis not present

## 2016-06-01 DIAGNOSIS — E876 Hypokalemia: Secondary | ICD-10-CM | POA: Diagnosis not present

## 2016-06-01 DIAGNOSIS — R479 Unspecified speech disturbances: Secondary | ICD-10-CM | POA: Diagnosis not present

## 2016-06-05 DIAGNOSIS — E876 Hypokalemia: Secondary | ICD-10-CM | POA: Diagnosis not present

## 2016-06-05 DIAGNOSIS — G459 Transient cerebral ischemic attack, unspecified: Secondary | ICD-10-CM | POA: Diagnosis not present

## 2016-06-05 DIAGNOSIS — G4700 Insomnia, unspecified: Secondary | ICD-10-CM | POA: Diagnosis not present

## 2016-06-05 DIAGNOSIS — H9190 Unspecified hearing loss, unspecified ear: Secondary | ICD-10-CM | POA: Diagnosis not present

## 2016-06-05 DIAGNOSIS — F411 Generalized anxiety disorder: Secondary | ICD-10-CM | POA: Diagnosis not present

## 2016-06-05 DIAGNOSIS — R479 Unspecified speech disturbances: Secondary | ICD-10-CM | POA: Diagnosis not present

## 2016-06-06 DIAGNOSIS — G4700 Insomnia, unspecified: Secondary | ICD-10-CM | POA: Diagnosis not present

## 2016-06-06 DIAGNOSIS — H9190 Unspecified hearing loss, unspecified ear: Secondary | ICD-10-CM | POA: Diagnosis not present

## 2016-06-06 DIAGNOSIS — G459 Transient cerebral ischemic attack, unspecified: Secondary | ICD-10-CM | POA: Diagnosis not present

## 2016-06-06 DIAGNOSIS — E876 Hypokalemia: Secondary | ICD-10-CM | POA: Diagnosis not present

## 2016-06-06 DIAGNOSIS — F411 Generalized anxiety disorder: Secondary | ICD-10-CM | POA: Diagnosis not present

## 2016-06-06 DIAGNOSIS — R479 Unspecified speech disturbances: Secondary | ICD-10-CM | POA: Diagnosis not present

## 2016-06-08 DIAGNOSIS — R479 Unspecified speech disturbances: Secondary | ICD-10-CM | POA: Diagnosis not present

## 2016-06-08 DIAGNOSIS — H9190 Unspecified hearing loss, unspecified ear: Secondary | ICD-10-CM | POA: Diagnosis not present

## 2016-06-08 DIAGNOSIS — E876 Hypokalemia: Secondary | ICD-10-CM | POA: Diagnosis not present

## 2016-06-08 DIAGNOSIS — G459 Transient cerebral ischemic attack, unspecified: Secondary | ICD-10-CM | POA: Diagnosis not present

## 2016-06-08 DIAGNOSIS — G4700 Insomnia, unspecified: Secondary | ICD-10-CM | POA: Diagnosis not present

## 2016-06-08 DIAGNOSIS — F411 Generalized anxiety disorder: Secondary | ICD-10-CM | POA: Diagnosis not present

## 2016-06-13 DIAGNOSIS — F411 Generalized anxiety disorder: Secondary | ICD-10-CM | POA: Diagnosis not present

## 2016-06-13 DIAGNOSIS — H9190 Unspecified hearing loss, unspecified ear: Secondary | ICD-10-CM | POA: Diagnosis not present

## 2016-06-13 DIAGNOSIS — G4700 Insomnia, unspecified: Secondary | ICD-10-CM | POA: Diagnosis not present

## 2016-06-13 DIAGNOSIS — G459 Transient cerebral ischemic attack, unspecified: Secondary | ICD-10-CM | POA: Diagnosis not present

## 2016-06-13 DIAGNOSIS — R479 Unspecified speech disturbances: Secondary | ICD-10-CM | POA: Diagnosis not present

## 2016-06-13 DIAGNOSIS — E876 Hypokalemia: Secondary | ICD-10-CM | POA: Diagnosis not present

## 2016-06-14 DIAGNOSIS — H9190 Unspecified hearing loss, unspecified ear: Secondary | ICD-10-CM | POA: Diagnosis not present

## 2016-06-14 DIAGNOSIS — F411 Generalized anxiety disorder: Secondary | ICD-10-CM | POA: Diagnosis not present

## 2016-06-14 DIAGNOSIS — G459 Transient cerebral ischemic attack, unspecified: Secondary | ICD-10-CM | POA: Diagnosis not present

## 2016-06-14 DIAGNOSIS — E876 Hypokalemia: Secondary | ICD-10-CM | POA: Diagnosis not present

## 2016-06-14 DIAGNOSIS — G4700 Insomnia, unspecified: Secondary | ICD-10-CM | POA: Diagnosis not present

## 2016-06-14 DIAGNOSIS — R479 Unspecified speech disturbances: Secondary | ICD-10-CM | POA: Diagnosis not present

## 2016-06-15 DIAGNOSIS — F411 Generalized anxiety disorder: Secondary | ICD-10-CM | POA: Diagnosis not present

## 2016-06-15 DIAGNOSIS — R479 Unspecified speech disturbances: Secondary | ICD-10-CM | POA: Diagnosis not present

## 2016-06-15 DIAGNOSIS — G4700 Insomnia, unspecified: Secondary | ICD-10-CM | POA: Diagnosis not present

## 2016-06-15 DIAGNOSIS — E876 Hypokalemia: Secondary | ICD-10-CM | POA: Diagnosis not present

## 2016-06-15 DIAGNOSIS — G459 Transient cerebral ischemic attack, unspecified: Secondary | ICD-10-CM | POA: Diagnosis not present

## 2016-06-15 DIAGNOSIS — H9190 Unspecified hearing loss, unspecified ear: Secondary | ICD-10-CM | POA: Diagnosis not present

## 2016-06-16 DIAGNOSIS — R479 Unspecified speech disturbances: Secondary | ICD-10-CM | POA: Diagnosis not present

## 2016-06-16 DIAGNOSIS — F411 Generalized anxiety disorder: Secondary | ICD-10-CM | POA: Diagnosis not present

## 2016-06-16 DIAGNOSIS — G4700 Insomnia, unspecified: Secondary | ICD-10-CM | POA: Diagnosis not present

## 2016-06-16 DIAGNOSIS — H9190 Unspecified hearing loss, unspecified ear: Secondary | ICD-10-CM | POA: Diagnosis not present

## 2016-06-16 DIAGNOSIS — E876 Hypokalemia: Secondary | ICD-10-CM | POA: Diagnosis not present

## 2016-06-16 DIAGNOSIS — G459 Transient cerebral ischemic attack, unspecified: Secondary | ICD-10-CM | POA: Diagnosis not present

## 2016-06-19 DIAGNOSIS — F411 Generalized anxiety disorder: Secondary | ICD-10-CM | POA: Diagnosis not present

## 2016-06-19 DIAGNOSIS — H9190 Unspecified hearing loss, unspecified ear: Secondary | ICD-10-CM | POA: Diagnosis not present

## 2016-06-19 DIAGNOSIS — G459 Transient cerebral ischemic attack, unspecified: Secondary | ICD-10-CM | POA: Diagnosis not present

## 2016-06-19 DIAGNOSIS — E876 Hypokalemia: Secondary | ICD-10-CM | POA: Diagnosis not present

## 2016-06-19 DIAGNOSIS — G4700 Insomnia, unspecified: Secondary | ICD-10-CM | POA: Diagnosis not present

## 2016-06-19 DIAGNOSIS — R479 Unspecified speech disturbances: Secondary | ICD-10-CM | POA: Diagnosis not present

## 2016-06-22 DIAGNOSIS — S82034D Nondisplaced transverse fracture of right patella, subsequent encounter for closed fracture with routine healing: Secondary | ICD-10-CM | POA: Diagnosis not present

## 2016-06-23 ENCOUNTER — Encounter: Payer: Self-pay | Admitting: Nurse Practitioner

## 2016-06-27 ENCOUNTER — Other Ambulatory Visit: Payer: Self-pay | Admitting: *Deleted

## 2016-06-27 MED ORDER — HYDROCODONE-ACETAMINOPHEN 5-325 MG PO TABS: ORAL_TABLET | ORAL | 0 refills | Status: DC

## 2016-06-27 NOTE — Telephone Encounter (Signed)
[facility] #[phone] Fax: [phone]  

## 2016-06-30 ENCOUNTER — Encounter: Payer: Self-pay | Admitting: Internal Medicine

## 2016-06-30 ENCOUNTER — Non-Acute Institutional Stay (SKILLED_NURSING_FACILITY): Payer: Medicare Other | Admitting: Internal Medicine

## 2016-06-30 DIAGNOSIS — F03C0 Unspecified dementia, severe, without behavioral disturbance, psychotic disturbance, mood disturbance, and anxiety: Secondary | ICD-10-CM

## 2016-06-30 DIAGNOSIS — D72829 Elevated white blood cell count, unspecified: Secondary | ICD-10-CM

## 2016-06-30 DIAGNOSIS — R400 Somnolence: Secondary | ICD-10-CM | POA: Diagnosis not present

## 2016-06-30 DIAGNOSIS — F0390 Unspecified dementia without behavioral disturbance: Secondary | ICD-10-CM | POA: Diagnosis not present

## 2016-06-30 DIAGNOSIS — N289 Disorder of kidney and ureter, unspecified: Secondary | ICD-10-CM

## 2016-06-30 DIAGNOSIS — E440 Moderate protein-calorie malnutrition: Secondary | ICD-10-CM

## 2016-06-30 DIAGNOSIS — R2681 Unsteadiness on feet: Secondary | ICD-10-CM

## 2016-06-30 DIAGNOSIS — S82091S Other fracture of right patella, sequela: Secondary | ICD-10-CM | POA: Diagnosis not present

## 2016-06-30 NOTE — Progress Notes (Signed)
Location:  [place] Room Number: N-12  Place of Service:  SNF [phone]) Provider:  Ann Held, MD  Patient Care Team: Blanchie Serve, MD as PCP - General (Internal Medicine) Melina Modena, [facility] Gaynelle Arabian, MD as Consulting Physician (Orthopedic Surgery) Mast, Man X, NP as Nurse Practitioner (Internal Medicine)  Extended Emergency Contact Information Primary Emergency Contact: Peri [place] of [place] Mobile Phone: [phone] Relation: Daughter Secondary Emergency Contact: Kerby Less [place] of [place] Phone: [phone] Relation: Daughter  Code Status:  DNR Goals of care: Advanced Directive information Advanced Directives 05/25/2016  Does Patient Have a Medical Advance Directive? Yes  Type of Paramedic of [place];Living will;Out of facility DNR (pink MOST or yellow form)  Does patient want to make changes to medical advance directive? -  Copy of [place] in Chart? Yes  Pre-existing out of facility DNR order (yellow form or pink MOST form) Yellow form placed in chart (order not valid for inpatient use)     Chief Complaint  Patient presents with  . Medical Management of Chronic Issues    Routine Visit     HPI:  Pt is a 83 y.o. female seen today for medical management of chronic diseases. She is pleasantly confused and sleeps for most part of the day per nursing. She does not participate in HPI and ROS. She had a fall yesterday trying to get out of bed unassisted. No injury has been reported. She has history of falls. She is wheelchair bound and needs assistance with transfer. She had right patella fracture few months back. She is now off immobilizer. She has poor po intake and has lost 10 lbs in 1 month.    Past Medical History:  Diagnosis Date  . Abnormality of gait 03/15/2010  . Anxiety state, unspecified 03/15/2010  . Depression, major 06/15/2015  .  Depressive disorder, not elsewhere classified 03/15/2010  . Dysphagia, unspecified(787.20) 03/15/2010  . Generalized anxiety disorder 06/23/2014  . Herpes zoster without mention of complication 5/44/9201  . Other alteration of consciousness 03/15/2010  . Pain in hand 08/13/12  . Personal history of fall 03/15/2010  . Senile osteoporosis 03/15/2010  . Unspecified constipation 03/15/2010  . Unspecified essential hypertension 03/15/2010  . Unspecified hearing loss 08/22/2011  . Unspecified urinary incontinence 03/15/2010  . Urinary incontinence 03/15/2010  . Vaginal prolapse 06/15/2015   Past Surgical History:  Procedure Laterality Date  . INCISION / DRAINAGE HAND / FINGER Right 06/2008   hand  . SPINE SURGERY  12/2006    Allergies  Allergen Reactions  . Ace Inhibitors Other (See Comments)    Reaction unknown/ listed on MAR  . Hydrochlorothiazide Other (See Comments)    Reaction unknown/ listed on MAR  . Morphine And Related Other (See Comments)    Hallucinations    Outpatient Encounter Prescriptions as of 06/30/2016  Medication Sig  . acetaminophen (TYLENOL) 325 MG tablet Take 2 tablets (650 mg total) by mouth every 6 (six) hours as needed for mild pain (or Fever >/= 101).  Marland Kitchen aspirin EC 81 MG tablet Take 1 tablet (81 mg total) by mouth daily.  . Calcium Carbonate-Vitamin D (CALCIUM 600+D) 600-200 MG-UNIT TABS Take 1 tablet by mouth daily.   . citalopram (CELEXA) 10 MG tablet Take one table once daily  . cyanocobalamin 1000 MCG tablet Take 1,000 mcg by mouth. Take one tablet once daily for Vitamin B-12  . feeding supplement (BOOST / RESOURCE BREEZE)  LIQD Take 1 Container by mouth 2 (two) times daily between meals.  Marland Kitchen HYDROcodone-acetaminophen (NORCO/VICODIN) 5-325 MG tablet Take one tablet by mouth every 6 hours as needed for pain  . HYDROcodone-acetaminophen (NORCO/VICODIN) 5-325 MG tablet Take 1 tablet by mouth at bedtime.  . hydrocortisone (PROCTO-MED HC) 2.5 % rectal cream Place 1  application rectally every 12 (twelve) hours as needed for hemorrhoids or itching.  Marland Kitchen ibuprofen (ADVIL,MOTRIN) 200 MG tablet Take 400 mg by mouth every 6 (six) hours as needed (for pain).  Marland Kitchen levothyroxine (SYNTHROID, LEVOTHROID) 25 MCG tablet Take 25 mcg by mouth daily before breakfast.  . LORazepam (ATIVAN) 0.5 MG tablet Take 0.5 mg by mouth. Take one tablet every 8 hours as needed for anxiety  . metoprolol tartrate (LOPRESSOR) 25 MG tablet Take 25 mg by mouth. Take 1/2 tablet (12.5 mg) twice a day  . mineral oil-hydrophilic petrolatum (AQUAPHOR) ointment Apply 1 application topically daily as needed for dry skin.  . polyethylene glycol (MIRALAX / GLYCOLAX) packet Take 17 g by mouth daily.  . risperiDONE (RISPERDAL) 0.5 MG tablet Take 0.5 mg by mouth. Take one tablet at bedtime   No facility-administered encounter medications on file as of 06/30/2016.     Review of Systems  Unable to perform ROS: Dementia (obtained information from nursing and patient's chart)    Immunization History  Administered Date(s) Administered  . Influenza Whole 10/24/2011, 10/24/2012  . Influenza-Unspecified 11/06/2013, 11/04/2015  . PPD Test 01/11/2015  . Pneumococcal Polysaccharide-23 01/23/2009  . Td 01/23/2009   Pertinent  Health Maintenance Due  Topic Date Due  . PNA vac Low Risk Adult (2 of 2 - PCV13) 01/23/2010  . DEXA SCAN  01/24/2023 (Originally 07/26/1984)  . INFLUENZA VACCINE  08/23/2016   Fall Risk  03/20/2016 09/21/2015 06/15/2015 01/05/2015 12/29/2014  Falls in the past year? Yes No No No No  Number falls in past yr: - - - - -  Injury with Fall? Yes - - - -  Risk Factor Category  High Fall Risk - - - -   Functional Status Survey:    Vitals:   06/30/16 1154  BP: (!) 162/70  Pulse: 60  Resp: 18  Temp: 97.8 F (36.6 C)  TempSrc: Oral  SpO2: 98%  Weight: 109 lb 12.8 oz (49.8 kg)  Height: 5\' 2"  (1.575 m)   Body mass index is 20.08 kg/m. Physical Exam  Constitutional: No distress.    Thin built and frail  HENT:  Head: Normocephalic and atraumatic.  Mouth/Throat: Oropharynx is clear and moist.  Hard of hearing and has hearing aid to left ear  Eyes: Conjunctivae are normal. Pupils are equal, round, and reactive to light.  Neck: Normal range of motion. Neck supple.  Cardiovascular: Normal rate and regular rhythm.   Pulmonary/Chest: Effort normal and breath sounds normal. She has no wheezes.  Abdominal: Soft. Bowel sounds are normal. She exhibits no distension. There is no tenderness. There is no guarding.  Musculoskeletal: She exhibits no edema.  Kyphosis and scoliosis present. Trigger finger to right 3rd and 4th finger with contracture, arthritis changes to her fingers. She gets around on wheelchair. High fall risk.   Lymphadenopathy:    She has no cervical adenopathy.  Neurological: She is alert.  Oriented to self only.   Skin: Skin is warm and dry. She is not diaphoretic.  Left ear auricle dried scab, no signs of infection    Labs reviewed:  Recent Labs  01/21/16 1815 01/22/16 0532 01/27/16 03/17/16 9811  03/18/16 0847  NA  --  137 138 139 137  K  --  4.2 4.1 3.3* 4.0  CL  --  103  --  102 102  CO2  --  27  --  29 26  GLUCOSE  --  85  --  97 110*  BUN  --  25* 19 17 24*  CREATININE  --  0.96 0.8 0.62 1.06*  CALCIUM  --  8.6*  --  9.1 9.0  MG 1.9  --   --   --   --     Recent Labs  01/21/16 1412 03/17/16 0907  AST 19 18  ALT 16 13*  ALKPHOS 62 59  BILITOT 0.8 1.1  PROT 6.4* 6.9  ALBUMIN 3.4* 4.2    Recent Labs  11/10/15 1452 01/21/16 1412 01/22/16 0532 01/27/16 03/17/16 0907 03/18/16 0431  WBC 8.1 10.4 7.5 5.8 10.0 11.5*  NEUTROABS 5.8 8.0*  --   --  6.8  --   HGB 13.1 12.0 11.9* 11.3* 13.8 13.1  HCT 38.5 35.5* 35.3* 34* 40.5 38.5  MCV 98.2 98.3 97.8  --  96.4 96.3  PLT 343 363 327 340 337 355   Lab Results  Component Value Date   TSH 3.135 01/22/2016   Lab Results  Component Value Date   HGBA1C 5.6 12/11/2014   Lab Results   Component Value Date   CHOL 178 12/11/2014   HDL 60 12/11/2014   LDLCALC 97 12/11/2014   TRIG 106 12/11/2014   CHOLHDL 3.0 12/11/2014    Significant Diagnostic Results in last 30 days:  No results found.  Assessment/Plan  Unsteady gait Had a fall yesterday with no apparent injury. She is a high fall risk. Fall precautions to be taken. With dementia she remains a fall risk.   Advanced dementia Decline anticipated. Supportive care for now.  Increased somnolence Reviewed medications. Has history of hypothyroidism, check tsh to rule out worsening. Another possibility is her scheduled narcotic at bedtime. Discontinue this and c/w prn norco. Her advanced dementia could also be contributing to this  Protein calorie malnutrition Severe and decline anticipated. 10 lb weight loss over a month with poor po intake. Check tsh level. RD to evaluate. Add remeron 7.5 mg daily to help stimulate appetite and monitor.   Leukocytosis Noted in labs from feb 2018. Afebrile. Given her increased somnolence, check cbc with diff.   Impaired renal function Noted in lab from feb. Maintain hydration. Check bmp.   S/p right patellar fracture Has her immobilizer removed. D/c scheduled norco and continue prn norco order. Schedule tylenol 1000 mg qhs to help with her pain and monitor   Family/ staff Communication: reviewed care plan with charge nurse  Labs/tests ordered:  Cbc with diff, bmp, tsh   Blanchie Serve, MD Internal Medicine [place], [place] [address] Cell Phone (Monday-Friday 8 am - 5 pm): [phone] On Call: [phone] and follow prompts after 5 pm and on weekends Office Phone: [phone] Office Fax: [phone]

## 2016-07-03 DIAGNOSIS — I10 Essential (primary) hypertension: Secondary | ICD-10-CM | POA: Diagnosis not present

## 2016-07-03 DIAGNOSIS — F411 Generalized anxiety disorder: Secondary | ICD-10-CM | POA: Diagnosis not present

## 2016-07-03 DIAGNOSIS — E876 Hypokalemia: Secondary | ICD-10-CM | POA: Diagnosis not present

## 2016-07-03 LAB — CBC AND DIFFERENTIAL
HEMATOCRIT: 36 (ref 36–46)
HEMOGLOBIN: 12.3 (ref 12.0–16.0)
Platelets: 328 (ref 150–399)
WBC: 7

## 2016-07-03 LAB — BASIC METABOLIC PANEL
BUN: 22 — AB (ref 4–21)
Creatinine: 0.7 (ref 0.5–1.1)
Glucose: 80
Potassium: 4.3 (ref 3.4–5.3)
SODIUM: 141 (ref 137–147)

## 2016-07-03 LAB — TSH: TSH: 2.38 (ref 0.41–5.90)

## 2016-07-05 ENCOUNTER — Non-Acute Institutional Stay (SKILLED_NURSING_FACILITY): Payer: Medicare Other | Admitting: Internal Medicine

## 2016-07-05 ENCOUNTER — Encounter: Payer: Self-pay | Admitting: Internal Medicine

## 2016-07-05 DIAGNOSIS — F341 Dysthymic disorder: Secondary | ICD-10-CM

## 2016-07-05 DIAGNOSIS — R400 Somnolence: Secondary | ICD-10-CM | POA: Diagnosis not present

## 2016-07-05 DIAGNOSIS — F0391 Unspecified dementia with behavioral disturbance: Secondary | ICD-10-CM | POA: Diagnosis not present

## 2016-07-05 DIAGNOSIS — F329 Major depressive disorder, single episode, unspecified: Secondary | ICD-10-CM

## 2016-07-05 NOTE — Progress Notes (Signed)
Location:  [facility] Room Number: 12 Place of Service:  SNF [phone]) Provider:  Kunal Levario Molly Maduro, Karalee Height, MD  Patient Care Team: Blanchie Serve, MD as PCP - General (Internal Medicine) Melina Modena, [facility] Gaynelle Arabian, MD as Consulting Physician (Orthopedic Surgery) Ngetich, Nelda Bucks, NP as Nurse Practitioner (Family Medicine)  Extended Emergency Contact Information Primary Emergency Contact: Peri Jefferson [place] of [place] Mobile Phone: [phone] Relation: Daughter Secondary Emergency Contact: Kerby Less [place] of [place] Phone: [phone] Relation: Daughter  Code Status:  DNR Goals of care: Advanced Directive information Advanced Directives 07/05/2016  Does Patient Have a Medical Advance Directive? Yes  Type of Paramedic of [place];Living will;Out of facility DNR (pink MOST or yellow form)  Does patient want to make changes to medical advance directive? -  Copy of [place] in Chart? Yes  Pre-existing out of facility DNR order (yellow form or pink MOST form) Yellow form placed in chart (order not valid for inpatient use)     Chief Complaint  Patient presents with  . Acute Visit    medication management; needs prevnar    HPI:  Pt is a 83 y.o. female seen today for medication management with attempt for GDR. She has dementia and has been on risperidone likely with her behavior disturbance. At present, no behavior disturbance reported by nursing. She has been sleeping a lot lately. Removing her scheduled narcotic at bedtime has helped her some to stay more awake during day time until breakfast. She is pleasantly confused.  Past Medical History:  Diagnosis Date  . Abnormality of gait 03/15/2010  . Anxiety state, unspecified 03/15/2010  . Depression, major 06/15/2015  . Depressive disorder, not elsewhere classified 03/15/2010  . Dysphagia, unspecified(787.20) 03/15/2010  .  Generalized anxiety disorder 06/23/2014  . Herpes zoster without mention of complication 3/78/5885  . Other alteration of consciousness 03/15/2010  . Pain in hand 08/13/12  . Personal history of fall 03/15/2010  . Senile osteoporosis 03/15/2010  . Unspecified constipation 03/15/2010  . Unspecified essential hypertension 03/15/2010  . Unspecified hearing loss 08/22/2011  . Unspecified urinary incontinence 03/15/2010  . Urinary incontinence 03/15/2010  . Vaginal prolapse 06/15/2015   Past Surgical History:  Procedure Laterality Date  . INCISION / DRAINAGE HAND / FINGER Right 06/2008   hand  . SPINE SURGERY  12/2006    Allergies  Allergen Reactions  . Ace Inhibitors Other (See Comments)    Reaction unknown/ listed on MAR  . Hydrochlorothiazide Other (See Comments)    Reaction unknown/ listed on MAR  . Morphine And Related Other (See Comments)    Hallucinations    Outpatient Encounter Prescriptions as of 07/05/2016  Medication Sig  . acetaminophen (TYLENOL) 325 MG tablet Take 2 tablets (650 mg total) by mouth every 6 (six) hours as needed for mild pain (or Fever >/= 101).  Marland Kitchen acetaminophen (TYLENOL) 500 MG tablet Take 1,000 mg by mouth at bedtime.  Marland Kitchen aspirin EC 81 MG tablet Take 1 tablet (81 mg total) by mouth daily.  . Calcium Carbonate-Vitamin D (CALCIUM 600+D) 600-200 MG-UNIT TABS Take 1 tablet by mouth daily.   . citalopram (CELEXA) 10 MG tablet Take one table once daily  . cyanocobalamin 1000 MCG tablet Take 1,000 mcg by mouth. Take one tablet once daily for Vitamin B-12  . HYDROcodone-acetaminophen (NORCO/VICODIN) 5-325 MG tablet Take 1 tablet by mouth every 6 (six) hours as needed for moderate pain.  . hydrocortisone (PROCTO-MED HC)  2.5 % rectal cream Place 1 application rectally every 12 (twelve) hours as needed for hemorrhoids or itching.  Marland Kitchen ibuprofen (ADVIL,MOTRIN) 200 MG tablet Take 400 mg by mouth every 6 (six) hours as needed (for pain).  Marland Kitchen levothyroxine (SYNTHROID, LEVOTHROID) 25  MCG tablet Take 25 mcg by mouth daily before breakfast.  . LORazepam (ATIVAN) 0.5 MG tablet Take 0.5 mg by mouth. Take one tablet every 8 hours as needed for anxiety  . metoprolol tartrate (LOPRESSOR) 25 MG tablet Take 25 mg by mouth. Take 1/2 tablet (12.5 mg) twice a day  . mineral oil-hydrophilic petrolatum (AQUAPHOR) ointment Apply 1 application topically daily as needed for dry skin.  . mirtazapine (REMERON) 7.5 MG tablet Take 7.5 mg by mouth at bedtime.  . Nutritional Supplements (RESOURCE 2.0) LIQD Take 120 mLs by mouth 2 (two) times daily between meals. At 10AM and 4PM  . polyethylene glycol (MIRALAX / GLYCOLAX) packet Take 17 g by mouth daily.  . risperiDONE (RISPERDAL) 0.5 MG tablet Take 0.5 mg by mouth. Take one tablet at bedtime  . [DISCONTINUED] feeding supplement (BOOST / RESOURCE BREEZE) LIQD Take 1 Container by mouth 2 (two) times daily between meals.  . [DISCONTINUED] HYDROcodone-acetaminophen (NORCO/VICODIN) 5-325 MG tablet Take one tablet by mouth every 6 hours as needed for pain  . [DISCONTINUED] HYDROcodone-acetaminophen (NORCO/VICODIN) 5-325 MG tablet Take 1 tablet by mouth at bedtime.   No facility-administered encounter medications on file as of 07/05/2016.     Review of Systems  Unable to perform ROS: Dementia (obtained information from nursing and patient's chart)    Immunization History  Administered Date(s) Administered  . Influenza Whole 10/24/2011, 10/24/2012  . Influenza-Unspecified 11/06/2013, 11/04/2015  . PPD Test 12/28/2014, 01/11/2015  . Pneumococcal Polysaccharide-23 01/23/2009  . Td 01/23/2009   Pertinent  Health Maintenance Due  Topic Date Due  . PNA vac Low Risk Adult (2 of 2 - PCV13) 01/23/2010  . DEXA SCAN  01/24/2023 (Originally 07/26/1984)  . INFLUENZA VACCINE  08/23/2016   Fall Risk  03/20/2016 09/21/2015 06/15/2015 01/05/2015 12/29/2014  Falls in the past year? Yes No No No No  Number falls in past yr: - - - - -  Injury with Fall? Yes - - - -    Risk Factor Category  High Fall Risk - - - -   Functional Status Survey:    Vitals:   07/05/16 1151  BP: 136/75  Pulse: (!) 59  Resp: 18  Temp: 97.9 F (36.6 C)  SpO2: 95%  Weight: 109 lb 1.6 oz (49.5 kg)  Height: 5\' 2"  (1.575 m)   Body mass index is 19.95 kg/m. Physical Exam  Constitutional: No distress.  Thin built and frail  HENT:  Head: Normocephalic and atraumatic.  Mouth/Throat: Oropharynx is clear and moist.  Hard of hearing and has hearing aid to left ear  Eyes: Conjunctivae are normal. Pupils are equal, round, and reactive to light.  Neck: Normal range of motion. Neck supple.  Cardiovascular: Normal rate and regular rhythm.   Pulmonary/Chest: Effort normal and breath sounds normal. She has no wheezes.  Abdominal: Soft. Bowel sounds are normal. She exhibits no distension. There is no tenderness. There is no guarding.  Musculoskeletal: She exhibits no edema.  Kyphosis and scoliosis present. Trigger finger to right 3rd and 4th finger with contracture, arthritis changes to her fingers. She gets around on wheelchair. High fall risk.   Lymphadenopathy:    She has no cervical adenopathy.  Neurological: She is alert.  Oriented to  self only. Falling asleep during conversation but easily arousable  Skin: Skin is warm and dry.  Left ear auricle dried scab, no signs of infection    Labs reviewed:  Recent Labs  01/21/16 1815 01/22/16 0532  03/17/16 0907 03/18/16 0847 07/03/16  NA  --  137  < > 139 137 141  K  --  4.2  < > 3.3* 4.0 4.3  CL  --  103  --  102 102  --   CO2  --  27  --  29 26  --   GLUCOSE  --  85  --  97 110*  --   BUN  --  25*  < > 17 24* 22*  CREATININE  --  0.96  < > 0.62 1.06* 0.7  CALCIUM  --  8.6*  --  9.1 9.0  --   MG 1.9  --   --   --   --   --   < > = values in this interval not displayed.  Recent Labs  01/21/16 1412 03/17/16 0907  AST 19 18  ALT 16 13*  ALKPHOS 62 59  BILITOT 0.8 1.1  PROT 6.4* 6.9  ALBUMIN 3.4* 4.2     Recent Labs  11/10/15 1452 01/21/16 1412 01/22/16 0532  03/17/16 0907 03/18/16 0431 07/03/16  WBC 8.1 10.4 7.5  < > 10.0 11.5* 7.0  NEUTROABS 5.8 8.0*  --   --  6.8  --   --   HGB 13.1 12.0 11.9*  < > 13.8 13.1 12.3  HCT 38.5 35.5* 35.3*  < > 40.5 38.5 36  MCV 98.2 98.3 97.8  --  96.4 96.3  --   PLT 343 363 327  < > 337 355 328  < > = values in this interval not displayed. Lab Results  Component Value Date   TSH 2.38 07/03/2016   Lab Results  Component Value Date   HGBA1C 5.6 12/11/2014   Lab Results  Component Value Date   CHOL 178 12/11/2014   HDL 60 12/11/2014   LDLCALC 97 12/11/2014   TRIG 106 12/11/2014   CHOLHDL 3.0 12/11/2014    Significant Diagnostic Results in last 30 days:  No results found.  Assessment/Plan  Advanced dementia without behavior disturbance Decline anticipated. Supportive care for now. Continue her risperidone but decrease dosing as below.   Increased somnolence Reviewed medications. Decrease risperidone to 0.25 mg daily and monitor.   Chronic depression Continue her celexa current regimen and prn anxiolytic. Decrease risperidone as above.   Immunization due She is due for prevnar vaccine, facility to provide   Family/ staff Communication: reviewed care plan with charge nurse  Labs/tests ordered:  none   Blanchie Serve, MD Internal Medicine [place], [place] [address] Cell Phone (Monday-Friday 8 am - 5 pm): [phone] On Call: [phone] and follow prompts after 5 pm and on weekends Office Phone: [phone] Office Fax: [phone]

## 2016-07-06 DIAGNOSIS — I10 Essential (primary) hypertension: Secondary | ICD-10-CM | POA: Diagnosis not present

## 2016-07-06 DIAGNOSIS — E119 Type 2 diabetes mellitus without complications: Secondary | ICD-10-CM | POA: Diagnosis not present

## 2016-07-06 LAB — HEMOGLOBIN A1C: HEMOGLOBIN A1C: 5.5

## 2016-08-02 ENCOUNTER — Encounter: Payer: Self-pay | Admitting: Family

## 2016-08-02 ENCOUNTER — Non-Acute Institutional Stay (SKILLED_NURSING_FACILITY): Payer: Medicare Other | Admitting: Family

## 2016-08-02 DIAGNOSIS — K5901 Slow transit constipation: Secondary | ICD-10-CM

## 2016-08-02 DIAGNOSIS — E039 Hypothyroidism, unspecified: Secondary | ICD-10-CM

## 2016-08-02 DIAGNOSIS — I10 Essential (primary) hypertension: Secondary | ICD-10-CM

## 2016-08-02 DIAGNOSIS — F411 Generalized anxiety disorder: Secondary | ICD-10-CM | POA: Diagnosis not present

## 2016-08-02 DIAGNOSIS — F339 Major depressive disorder, recurrent, unspecified: Secondary | ICD-10-CM

## 2016-08-03 ENCOUNTER — Encounter: Payer: Self-pay | Admitting: *Deleted

## 2016-08-03 DIAGNOSIS — I10 Essential (primary) hypertension: Secondary | ICD-10-CM | POA: Diagnosis not present

## 2016-08-03 LAB — LIPID PANEL
CHOLESTEROL: 168 (ref 0–200)
HDL: 56 (ref 35–70)
LDL Cholesterol: 93
TRIGLYCERIDES: 96 (ref 40–160)

## 2016-08-06 NOTE — Progress Notes (Signed)
Location:  [facility] Room Number: 12 Place of Service:  SNF (31) Provider: Dinah Ngetich FNP-C   Blanchie Serve, MD  Patient Care Team: Blanchie Serve, MD as PCP - General (Internal Medicine) Melina Modena, [facility] Gaynelle Arabian, MD as Consulting Physician (Orthopedic Surgery) Ngetich, Nelda Bucks, NP as Nurse Practitioner (Family Medicine)  Extended Emergency Contact Information Primary Emergency Contact: Peri Jefferson [place] of [place] Mobile Phone: [phone] Relation: Daughter Secondary Emergency Contact: Kerby Less [place] of [place] Phone: [phone] Relation: Daughter  Code Status:  DNR Goals of care: Advanced Directive information Advanced Directives 08/02/2016  Does Patient Have a Medical Advance Directive? Yes  Type of Advance Directive Out of facility DNR (pink MOST or yellow form);Living will  Does patient want to make changes to medical advance directive? -  Copy of [place] in Chart? -  Pre-existing out of facility DNR order (yellow form or pink MOST form) Yellow form placed in chart (order not valid for inpatient use)     Chief Complaint  Patient presents with  . Medical Management of Chronic Issues    routine visit    HPI:  Pt is a 83 y.o. female seen today [facility] for medical management of chronic diseases.She has a medical history of HTN, TIA,hypothyroidism,Dementia,Depression, anxiety among other conditions. She is seen in her room today. She is unable to provide HPI and ROS information due to her cognitive impairment. Facility Nurse reports no new acute issues, fall episodes or weight changes.She continues to require assistance with her ADL's. She has a good appetite.No skin breakdown.      Past Medical History:  Diagnosis Date  . Abnormality of gait 03/15/2010  . Anxiety state, unspecified 03/15/2010  . Depression, major 06/15/2015  . Depressive disorder, not elsewhere classified  03/15/2010  . Dysphagia, unspecified(787.20) 03/15/2010  . Generalized anxiety disorder 06/23/2014  . Herpes zoster without mention of complication 4/82/5003  . Other alteration of consciousness 03/15/2010  . Pain in hand 08/13/12  . Personal history of fall 03/15/2010  . Senile osteoporosis 03/15/2010  . Unspecified constipation 03/15/2010  . Unspecified essential hypertension 03/15/2010  . Unspecified hearing loss 08/22/2011  . Unspecified urinary incontinence 03/15/2010  . Urinary incontinence 03/15/2010  . Vaginal prolapse 06/15/2015   Past Surgical History:  Procedure Laterality Date  . INCISION / DRAINAGE HAND / FINGER Right 06/2008   hand  . SPINE SURGERY  12/2006    Allergies  Allergen Reactions  . Ace Inhibitors Other (See Comments)    Reaction unknown/ listed on MAR  . Hydrochlorothiazide Other (See Comments)    Reaction unknown/ listed on MAR  . Morphine And Related Other (See Comments)    Hallucinations    Allergies as of 08/02/2016      Reactions   Ace Inhibitors Other (See Comments)   Reaction unknown/ listed on MAR   Hydrochlorothiazide Other (See Comments)   Reaction unknown/ listed on MAR   Morphine And Related Other (See Comments)   Hallucinations      Medication List       Accurate as of 08/02/16 11:59 PM. Always use your most recent med list.          acetaminophen 500 MG tablet Commonly known as:  TYLENOL Take 1,000 mg by mouth at bedtime.   acetaminophen 325 MG tablet Commonly known as:  TYLENOL Take 2 tablets (650 mg total) by mouth every 6 (six) hours as needed for mild pain (or Fever >/=  101).   aspirin EC 81 MG tablet Take 1 tablet (81 mg total) by mouth daily.   CALCIUM 600+D 600-200 MG-UNIT Tabs Generic drug:  Calcium Carbonate-Vitamin D Take 1 tablet by mouth daily.   citalopram 10 MG tablet Commonly known as:  CELEXA Take one table once daily   cyanocobalamin 1000 MCG tablet Take 1,000 mcg by mouth. Take one tablet once daily for  Vitamin B-12   HYDROcodone-acetaminophen 5-325 MG tablet Commonly known as:  NORCO/VICODIN Take 1 tablet by mouth every 6 (six) hours as needed for moderate pain.   ibuprofen 200 MG tablet Commonly known as:  ADVIL,MOTRIN Take 400 mg by mouth every 6 (six) hours as needed (for pain).   levothyroxine 25 MCG tablet Commonly known as:  SYNTHROID, LEVOTHROID Take 25 mcg by mouth daily before breakfast.   LORazepam 0.5 MG tablet Commonly known as:  ATIVAN Take 0.5 mg by mouth. Take one tablet every 8 hours as needed for anxiety   metoprolol tartrate 25 MG tablet Commonly known as:  LOPRESSOR Take 25 mg by mouth. Take 1/2 tablet (12.5 mg) twice a day   mineral oil-hydrophilic petrolatum ointment Apply 1 application topically daily as needed for dry skin.   mirtazapine 7.5 MG tablet Commonly known as:  REMERON Take 7.5 mg by mouth at bedtime.   polyethylene glycol packet Commonly known as:  MIRALAX / GLYCOLAX Take 17 g by mouth daily.   PROCTO-MED HC 2.5 % rectal cream Generic drug:  hydrocortisone Place 1 application rectally every 12 (twelve) hours as needed for hemorrhoids or itching.   RESOURCE 2.0 Liqd Take 120 mLs by mouth 2 (two) times daily between meals. At 10AM and 4PM   BENECALORIE PO Take 1.5 oz by mouth daily.   risperiDONE 0.5 MG tablet Commonly known as:  RISPERDAL Take 0.25 mg by mouth daily. Take one tablet at bedtime       Review of Systems  Unable to perform ROS: Dementia    Immunization History  Administered Date(s) Administered  . Influenza Whole 10/24/2011, 10/24/2012  . Influenza-Unspecified 11/06/2013, 11/04/2015  . PPD Test 12/28/2014, 01/11/2015  . Pneumococcal Polysaccharide-23 01/23/2009  . Td 01/23/2009   Pertinent  Health Maintenance Due  Topic Date Due  . PNA vac Low Risk Adult (2 of 2 - PCV13) 01/23/2010  . DEXA SCAN  01/24/2023 (Originally 07/26/1984)  . INFLUENZA VACCINE  08/23/2016   Fall Risk  03/20/2016 09/21/2015 06/15/2015  01/05/2015 12/29/2014  Falls in the past year? Yes No No No No  Number falls in past yr: - - - - -  Injury with Fall? Yes - - - -  Risk Factor Category  High Fall Risk - - - -    Vitals:   08/02/16 1115  BP: 130/68  Pulse: 60  Resp: 20  Temp: (!) 96.8 F (36 C)  SpO2: 97%  Weight: 110 lb 6.4 oz (50.1 kg)  Height: 5\' 2"  (1.575 m)   Body mass index is 20.19 kg/m. Physical Exam  Constitutional:  Thin frail elderly in no acute distress  HENT:  Head: Normocephalic.  Right Ear: External ear normal.  Left Ear: External ear normal.  Mouth/Throat: Oropharynx is clear and moist. No oropharyngeal exudate.  Eyes: Pupils are equal, round, and reactive to light. Conjunctivae and EOM are normal. Right eye exhibits no discharge. Left eye exhibits no discharge. No scleral icterus.  Neck: Normal range of motion. No JVD present. No thyromegaly present.  Cardiovascular: Normal rate, regular rhythm and intact  distal pulses.  Exam reveals no friction rub.   No murmur heard. Pulmonary/Chest: Effort normal and breath sounds normal. No respiratory distress. She has no wheezes. She has no rales.  Abdominal: Soft. Bowel sounds are normal. She exhibits no distension. There is no tenderness. There is no rebound and no guarding.  Musculoskeletal: She exhibits no edema.  Unsteady gait. Scoliosis/kyphosis noted. Arthritic changes on fingers.   Lymphadenopathy:    She has no cervical adenopathy.  Neurological:  Alert and oriented to self and person.   Skin: Skin is warm and dry. No rash noted. No erythema. No pallor.  Skin intact   Psychiatric:  Pleasantly confused at her baseline     Labs reviewed:  Recent Labs  01/21/16 1815 01/22/16 0532  03/17/16 0907 03/18/16 0847 07/03/16  NA  --  137  < > 139 137 141  K  --  4.2  < > 3.3* 4.0 4.3  CL  --  103  --  102 102  --   CO2  --  27  --  29 26  --   GLUCOSE  --  85  --  97 110*  --   BUN  --  25*  < > 17 24* 22*  CREATININE  --  0.96  < > 0.62  1.06* 0.7  CALCIUM  --  8.6*  --  9.1 9.0  --   MG 1.9  --   --   --   --   --   < > = values in this interval not displayed.  Recent Labs  01/21/16 1412 03/17/16 0907  AST 19 18  ALT 16 13*  ALKPHOS 62 59  BILITOT 0.8 1.1  PROT 6.4* 6.9  ALBUMIN 3.4* 4.2    Recent Labs  11/10/15 1452 01/21/16 1412 01/22/16 0532  03/17/16 0907 03/18/16 0431 07/03/16  WBC 8.1 10.4 7.5  < > 10.0 11.5* 7.0  NEUTROABS 5.8 8.0*  --   --  6.8  --   --   HGB 13.1 12.0 11.9*  < > 13.8 13.1 12.3  HCT 38.5 35.5* 35.3*  < > 40.5 38.5 36  MCV 98.2 98.3 97.8  --  96.4 96.3  --   PLT 343 363 327  < > 337 355 328  < > = values in this interval not displayed. Lab Results  Component Value Date   TSH 2.38 07/03/2016   Lab Results  Component Value Date   HGBA1C 5.5 07/06/2016   Lab Results  Component Value Date   CHOL 168 08/03/2016   HDL 56 08/03/2016   LDLCALC 93 08/03/2016   TRIG 96 08/03/2016   CHOLHDL 3.0 12/11/2014    Significant Diagnostic Results in last 30 days:  No results found.  Assessment/Plan 1. Essential hypertension B/p stable this visit. Few sporadic SBP in the 160's-170's.Continue metoprolol tartrate 12.5 mg tablet twice daily. Continue to monitor. Will adjust Metoprolol dose if SBP persist in the 160's.   2. Hypothyroidism Lab Results  Component Value Date   TSH 2.38 07/03/2016  Continue on Levothyroxine 25 mcg tablet. Will Recheck TSH level in 3 months.   3. Recurrent major depressive disorder Stable.Continue on Celexa 10 mg tablet.Continue to monitor for mood changes.encourage to participate in facility activities.    4. Generalized anxiety disorder Stable. Continue on lorazepam as needed.   5. Slow transit constipation Current regimen effective. Continue to encourage oral intake and fluid hydration.    Family/ staff Communication: Reviewed plan of  care with patient and facility Nurse supervisor   Labs/tests ordered: None   Sandrea Hughs, NP

## 2016-08-28 ENCOUNTER — Non-Acute Institutional Stay (SKILLED_NURSING_FACILITY): Payer: Medicare Other

## 2016-08-28 DIAGNOSIS — Z0000 Encounter for general adult medical examination without abnormal findings: Secondary | ICD-10-CM | POA: Diagnosis not present

## 2016-08-28 NOTE — Progress Notes (Signed)
Subjective:   [name] is a 83 y.o. female who presents for an Initial Medicare Annual Wellness Visit at [facility]; incapacitated patient unable to answer questions appropriately        Objective:    Today's Vitals   08/28/16 1549  BP: 130/90  Pulse: (!) 57  Temp: 98 F (36.7 C)  TempSrc: Oral  SpO2: 96%  Weight: 110 lb (49.9 kg)  Height: 5\' 2"  (1.575 m)   Body mass index is 20.12 kg/m.   Current Medications (verified) Outpatient Encounter Prescriptions as of 08/28/2016  Medication Sig  . acetaminophen (TYLENOL) 325 MG tablet Take 2 tablets (650 mg total) by mouth every 6 (six) hours as needed for mild pain (or Fever >/= 101).  Marland Kitchen acetaminophen (TYLENOL) 500 MG tablet Take 1,000 mg by mouth at bedtime.  Marland Kitchen aspirin EC 81 MG tablet Take 1 tablet (81 mg total) by mouth daily.  . Calcium Carbonate-Vitamin D (CALCIUM 600+D) 600-200 MG-UNIT TABS Take 1 tablet by mouth daily.   . citalopram (CELEXA) 10 MG tablet Take one table once daily  . cyanocobalamin 1000 MCG tablet Take 1,000 mcg by mouth. Take one tablet once daily for Vitamin B-12  . HYDROcodone-acetaminophen (NORCO/VICODIN) 5-325 MG tablet Take 1 tablet by mouth every 6 (six) hours as needed for moderate pain.  . hydrocortisone (PROCTO-MED HC) 2.5 % rectal cream Place 1 application rectally every 12 (twelve) hours as needed for hemorrhoids or itching.  Marland Kitchen ibuprofen (ADVIL,MOTRIN) 200 MG tablet Take 400 mg by mouth every 6 (six) hours as needed (for pain).  Marland Kitchen levothyroxine (SYNTHROID, LEVOTHROID) 25 MCG tablet Take 25 mcg by mouth daily before breakfast.  . LORazepam (ATIVAN) 0.5 MG tablet Take 0.5 mg by mouth. Take one tablet every 8 hours as needed for anxiety  . metoprolol tartrate (LOPRESSOR) 25 MG tablet Take 25 mg by mouth. Take 1/2 tablet (12.5 mg) twice a day  . mineral oil-hydrophilic petrolatum (AQUAPHOR) ointment Apply 1 application topically daily as needed for dry skin.  . mirtazapine (REMERON) 7.5  MG tablet Take 7.5 mg by mouth at bedtime.  . Nutritional Supplements (BENECALORIE PO) Take 1.5 oz by mouth daily.  . Nutritional Supplements (RESOURCE 2.0) LIQD Take 120 mLs by mouth 2 (two) times daily between meals. At 10AM and 4PM  . polyethylene glycol (MIRALAX / GLYCOLAX) packet Take 17 g by mouth daily.  . risperiDONE (RISPERDAL) 0.5 MG tablet Take 0.25 mg by mouth daily. Take one tablet at bedtime    No facility-administered encounter medications on file as of 08/28/2016.     Allergies (verified) Ace inhibitors; Hydrochlorothiazide; and Morphine and related   History: Past Medical History:  Diagnosis Date  . Abnormality of gait 03/15/2010  . Anxiety state, unspecified 03/15/2010  . Depression, major 06/15/2015  . Depressive disorder, not elsewhere classified 03/15/2010  . Dysphagia, unspecified(787.20) 03/15/2010  . Generalized anxiety disorder 06/23/2014  . Herpes zoster without mention of complication 7/62/8315  . Other alteration of consciousness 03/15/2010  . Pain in hand 08/13/12  . Personal history of fall 03/15/2010  . Senile osteoporosis 03/15/2010  . Unspecified constipation 03/15/2010  . Unspecified essential hypertension 03/15/2010  . Unspecified hearing loss 08/22/2011  . Unspecified urinary incontinence 03/15/2010  . Urinary incontinence 03/15/2010  . Vaginal prolapse 06/15/2015   Past Surgical History:  Procedure Laterality Date  . INCISION / DRAINAGE HAND / FINGER Right 06/2008   hand  . SPINE SURGERY  12/2006   Family History  Problem  Relation Age of Onset  . Heart disease Father        MI   Social History   Occupational History  . [employer]    Social History Main Topics  . Smoking status: Never Smoker  . Smokeless tobacco: Never Used  . Alcohol use Yes     Comment: 2-4 oz of wine once daily at request  . Drug use: No  . Sexual activity: No    Tobacco Counseling Counseling given: Not Answered   Activities of Daily Living In your present state  of health, do you have any difficulty performing the following activities: 08/28/2016 03/17/2016  Hearing? Tempie Donning  Vision? N Y  Difficulty concentrating or making decisions? Tempie Donning  Walking or climbing stairs? Y Y  Dressing or bathing? Y Y  Doing errands, shopping? Tempie Donning  Preparing Food and eating ? Y -  Using the Toilet? Y -  In the past six months, have you accidently leaked urine? Y -  Do you have problems with loss of bowel control? Y -  Managing your Medications? Y -  Managing your Finances? Y -  Housekeeping or managing your Housekeeping? Y -  Some recent data might be hidden    Immunizations and Health Maintenance Immunization History  Administered Date(s) Administered  . Influenza Whole 10/24/2011, 10/24/2012  . Influenza-Unspecified 11/06/2013, 11/04/2015  . PPD Test 12/28/2014, 01/11/2015  . Pneumococcal Polysaccharide-23 01/23/2009  . Td 01/23/2009   Health Maintenance Due  Topic Date Due  . PNA vac Low Risk Adult (2 of 2 - PCV13) 01/23/2010  . INFLUENZA VACCINE  08/23/2016    Patient Care Team: Blanchie Serve, MD as PCP - General (Internal Medicine) Melina Modena, [facility] Gaynelle Arabian, MD as Consulting Physician (Orthopedic Surgery) Ngetich, Nelda Bucks, NP as Nurse Practitioner (Family Medicine)  Indicate any recent Medical Services you may have received from other than [facility] providers in the past year (date may be approximate).     Assessment:   This is a routine wellness examination for [name].   Hearing/Vision screen No exam data present  Dietary issues and exercise activities discussed: Current Exercise Habits: The patient does not participate in regular exercise at present, Exercise limited by: neurologic condition(s);orthopedic condition(s)  Goals    None     Depression Screen PHQ 2/9 Scores 08/28/2016 09/21/2015 12/29/2014 12/07/2014 06/23/2014 12/16/2013  PHQ - 2 Score - 1 0 0 2 1  PHQ- 9 Score - - - - 6 -  Exception Documentation Medical reason - - - - -      Fall Risk Fall Risk  08/28/2016 03/20/2016 09/21/2015 06/15/2015 01/05/2015  Falls in the past year? Yes Yes No No No  Number falls in past yr: 2 or more - - - -  Injury with Fall? Yes Yes - - -  Comment - fracture right knee - - -  Risk Factor Category  - High Fall Risk - - -    Cognitive Function: MMSE - Mini Mental State Exam 08/28/2016 12/29/2014  Not completed: Unable to complete (No Data)  Orientation to time - 5  Orientation to Place - 5  Registration - 3  Attention/ Calculation - 5  Recall - 0  Language- name 2 objects - 2  Language- repeat - 1  Language- follow 3 step command - 3  Language- read & follow direction - 1  Write a sentence - 1  Copy design - 0  Total score - 26  Screening Tests Health Maintenance  Topic Date Due  . PNA vac Low Risk Adult (2 of 2 - PCV13) 01/23/2010  . INFLUENZA VACCINE  08/23/2016  . DEXA SCAN  01/24/2023 (Originally 07/[birth year]1984)  . TETANUS/TDAP  01/24/2019      Plan:    I have personally reviewed and addressed the Medicare Annual Wellness questionnaire and have noted the following in the patient's chart:  A. Medical and social history B. Use of alcohol, tobacco or illicit drugs  C. Current medications and supplements D. Functional ability and status E.  Nutritional status F.  Physical activity G. Advance directives H. List of other physicians I.  Hospitalizations, surgeries, and ER visits in previous 12 months J.  [place] to include hearing, vision, cognitive, depression L. Referrals and appointments - none  In addition, I am unable to review and discuss with incapacitated patient certain preventive protocols, quality metrics, and best practice recommendations. A written personalized care plan for preventive services as well as general preventive health recommendations were provided to patient.   See attached scanned questionnaire for additional information.   Signed,   Rich Reining, RN Nurse Health  Advisor   Quick Notes   Health Maintenance: PNA 13 and DEXA due. DEXA not ordered, pt wheelchair bound     Abnormal Screen: Unable to complete mental exam     Patient Concerns: None     Nurse Concerns: None

## 2016-08-28 NOTE — Patient Instructions (Signed)
[name] , Thank you for taking time to come for your Medicare Wellness Visit. I appreciate your ongoing commitment to your health goals. Please review the following plan we discussed and let me know if I can assist you in the future.   Screening recommendations/referrals: Colonoscopy excluded, pt over age 81 Mammogram excluded, pt over age 52 Bone Density due, excluded pt wheelchair bound Recommended yearly ophthalmology/optometry visit for glaucoma screening and checkup Recommended yearly dental visit for hygiene and checkup  Vaccinations: Influenza vaccine due 2018 fall season Pneumococcal vaccine 13 due Tdap vaccine up to date, due 01/24/19 Shingles vaccine not in records  Advanced directives: In Chart  Conditions/risks identified: None  Next appointment: Dr. Bubba Camp makes rounds   Preventive Care 100 Years and Older, Female Preventive care refers to lifestyle choices and visits with your health care provider that can promote health and wellness. What does preventive care include?  A yearly physical exam. This is also called an annual well check.  Dental exams once or twice a year.  Routine eye exams. Ask your health care provider how often you should have your eyes checked.  Personal lifestyle choices, including:  Daily care of your teeth and gums.  Regular physical activity.  Eating a healthy diet.  Avoiding tobacco and drug use.  Limiting alcohol use.  Practicing safe sex.  Taking low-dose aspirin every day.  Taking vitamin and mineral supplements as recommended by your health care provider. What happens during an annual well check? The services and screenings done by your health care provider during your annual well check will depend on your age, overall health, lifestyle risk factors, and family history of disease. Counseling  Your health care provider may ask you questions about your:  Alcohol use.  Tobacco use.  Drug use.  Emotional  well-being.  Home and relationship well-being.  Sexual activity.  Eating habits.  History of falls.  Memory and ability to understand (cognition).  Work and work Statistician.  Reproductive health. Screening  You may have the following tests or measurements:  Height, weight, and BMI.  Blood pressure.  Lipid and cholesterol levels. These may be checked every 5 years, or more frequently if you are over 31 years old.  Skin check.  Lung cancer screening. You may have this screening every year starting at age 81 if you have a 30-pack-year history of smoking and currently smoke or have quit within the past 15 years.  Fecal occult blood test (FOBT) of the stool. You may have this test every year starting at age 44.  Flexible sigmoidoscopy or colonoscopy. You may have a sigmoidoscopy every 5 years or a colonoscopy every 10 years starting at age 106.  Hepatitis C blood test.  Hepatitis B blood test.  Sexually transmitted disease (STD) testing.  Diabetes screening. This is done by checking your blood sugar (glucose) after you have not eaten for a while (fasting). You may have this done every 1-3 years.  Bone density scan. This is done to screen for osteoporosis. You may have this done starting at age 21.  Mammogram. This may be done every 1-2 years. Talk to your health care provider about how often you should have regular mammograms. Talk with your health care provider about your test results, treatment options, and if necessary, the need for more tests. Vaccines  Your health care provider may recommend certain vaccines, such as:  Influenza vaccine. This is recommended every year.  Tetanus, diphtheria, and acellular pertussis (Tdap, Td) vaccine. You may  need a Td booster every 10 years.  Zoster vaccine. You may need this after age 60.  Pneumococcal 13-valent conjugate (PCV13) vaccine. One dose is recommended after age 31.  Pneumococcal polysaccharide (PPSV23) vaccine. One  dose is recommended after age 33. Talk to your health care provider about which screenings and vaccines you need and how often you need them. This information is not intended to replace advice given to you by your health care provider. Make sure you discuss any questions you have with your health care provider. Document Released: 02/05/2015 Document Revised: 09/29/2015 Document Reviewed: 11/10/2014 Elsevier Interactive Patient Education  2017 [place] Prevention in the Home Falls can cause injuries. They can happen to people of all ages. There are many things you can do to make your home safe and to help prevent falls. What can I do on the outside of my home?  Regularly fix the edges of walkways and driveways and fix any cracks.  Remove anything that might make you trip as you walk through a door, such as a raised step or threshold.  Trim any bushes or trees on the path to your home.  Use bright outdoor lighting.  Clear any walking paths of anything that might make someone trip, such as rocks or tools.  Regularly check to see if handrails are loose or broken. Make sure that both sides of any steps have handrails.  Any raised decks and porches should have guardrails on the edges.  Have any leaves, snow, or ice cleared regularly.  Use sand or salt on walking paths during winter.  Clean up any spills in your garage right away. This includes oil or grease spills. What can I do in the bathroom?  Use night lights.  Install grab bars by the toilet and in the tub and shower. Do not use towel bars as grab bars.  Use non-skid mats or decals in the tub or shower.  If you need to sit down in the shower, use a plastic, non-slip stool.  Keep the floor dry. Clean up any water that spills on the floor as soon as it happens.  Remove soap buildup in the tub or shower regularly.  Attach bath mats securely with double-sided non-slip rug tape.  Do not have throw rugs and other  things on the floor that can make you trip. What can I do in the bedroom?  Use night lights.  Make sure that you have a light by your bed that is easy to reach.  Do not use any sheets or blankets that are too big for your bed. They should not hang down onto the floor.  Have a firm chair that has side arms. You can use this for support while you get dressed.  Do not have throw rugs and other things on the floor that can make you trip. What can I do in the kitchen?  Clean up any spills right away.  Avoid walking on wet floors.  Keep items that you use a lot in easy-to-reach places.  If you need to reach something above you, use a strong step stool that has a grab bar.  Keep electrical cords out of the way.  Do not use floor polish or wax that makes floors slippery. If you must use wax, use non-skid floor wax.  Do not have throw rugs and other things on the floor that can make you trip. What can I do with my stairs?  Do not leave any items on  the stairs.  Make sure that there are handrails on both sides of the stairs and use them. Fix handrails that are broken or loose. Make sure that handrails are as long as the stairways.  Check any carpeting to make sure that it is firmly attached to the stairs. Fix any carpet that is loose or worn.  Avoid having throw rugs at the top or bottom of the stairs. If you do have throw rugs, attach them to the floor with carpet tape.  Make sure that you have a light switch at the top of the stairs and the bottom of the stairs. If you do not have them, ask someone to add them for you. What else can I do to help prevent falls?  Wear shoes that:  Do not have high heels.  Have rubber bottoms.  Are comfortable and fit you well.  Are closed at the toe. Do not wear sandals.  If you use a stepladder:  Make sure that it is fully opened. Do not climb a closed stepladder.  Make sure that both sides of the stepladder are locked into place.  Ask  someone to hold it for you, if possible.  Clearly mark and make sure that you can see:  Any grab bars or handrails.  First and last steps.  Where the edge of each step is.  Use tools that help you move around (mobility aids) if they are needed. These include:  Canes.  Walkers.  Scooters.  Crutches.  Turn on the lights when you go into a dark area. Replace any light bulbs as soon as they burn out.  Set up your furniture so you have a clear path. Avoid moving your furniture around.  If any of your floors are uneven, fix them.  If there are any pets around you, be aware of where they are.  Review your medicines with your doctor. Some medicines can make you feel dizzy. This can increase your chance of falling. Ask your doctor what other things that you can do to help prevent falls. This information is not intended to replace advice given to you by your health care provider. Make sure you discuss any questions you have with your health care provider. Document Released: 11/05/2008 Document Revised: 06/17/2015 Document Reviewed: 02/13/2014 Elsevier Interactive Patient Education  2017 [employer].

## 2016-09-07 ENCOUNTER — Encounter: Payer: Self-pay | Admitting: Family

## 2016-09-07 ENCOUNTER — Non-Acute Institutional Stay (SKILLED_NURSING_FACILITY): Payer: Medicare Other | Admitting: Family

## 2016-09-07 DIAGNOSIS — F0390 Unspecified dementia without behavioral disturbance: Secondary | ICD-10-CM | POA: Diagnosis not present

## 2016-09-07 NOTE — Progress Notes (Signed)
Location:  [place] Room Number: 58 Place of Service:  SNF (31) Provider: Marci Polito FNP-C  Blanchie Serve, MD  Patient Care Team: Blanchie Serve, MD as PCP - General (Internal Medicine) Melina Modena, [facility] Gaynelle Arabian, MD as Consulting Physician (Orthopedic Surgery) Kellyn Mccary, Nelda Bucks, NP as Nurse Practitioner (Family Medicine)  Extended Emergency Contact Information Primary Emergency Contact: Peri Jefferson [place] of [place] Mobile Phone: [phone] Relation: Daughter Secondary Emergency Contact: Kerby Less [place] of [place] Phone: [phone] Relation: Daughter  Code Status:  DNR Goals of care: Advanced Directive information Advanced Directives 09/07/2016  Does Patient Have a Medical Advance Directive? Yes  Type of Advance Directive Out of facility DNR (pink MOST or yellow form);Living will;Healthcare Power of Attorney  Does patient want to make changes to medical advance directive? -  Copy of [place] in Chart? Yes  Pre-existing out of facility DNR order (yellow form or pink MOST form) Yellow form placed in chart (order not valid for inpatient use)     Chief Complaint  Patient presents with  . Acute Visit    worsening cognitive impairment; repeat MMSE?    HPI:  Pt is a 83 y.o. female seen today at [facility] for an acute visit for evaluation of worsening cognitive impairment.she has a significant medical history for HTN, TIA, Hypothyroidism, GAD, depression among other conditions. She is seen in her room today.HPI and ROS limited due to cognitive impairment. Additional information provided by facility Nurse/staff who reports patient has had worsening cognitive impairment request re-evaluation for dementia. Nurse states patient resist care and anxious at times.currently on rispiradone and Ativan. No combative behaviors noted by facility staff.No fever, chills or cough reported.       Past  Medical History:  Diagnosis Date  . Abnormality of gait 03/15/2010  . Anxiety state, unspecified 03/15/2010  . Depression, major 06/15/2015  . Depressive disorder, not elsewhere classified 03/15/2010  . Dysphagia, unspecified(787.20) 03/15/2010  . Generalized anxiety disorder 06/23/2014  . Herpes zoster without mention of complication 2/95/6213  . Other alteration of consciousness 03/15/2010  . Pain in hand 08/13/12  . Personal history of fall 03/15/2010  . Senile osteoporosis 03/15/2010  . Unspecified constipation 03/15/2010  . Unspecified essential hypertension 03/15/2010  . Unspecified hearing loss 08/22/2011  . Unspecified urinary incontinence 03/15/2010  . Urinary incontinence 03/15/2010  . Vaginal prolapse 06/15/2015   Past Surgical History:  Procedure Laterality Date  . INCISION / DRAINAGE HAND / FINGER Right 06/2008   hand  . SPINE SURGERY  12/2006    Allergies  Allergen Reactions  . Ace Inhibitors Other (See Comments)    Reaction unknown/ listed on MAR  . Hydrochlorothiazide Other (See Comments)    Reaction unknown/ listed on MAR  . Morphine And Related Other (See Comments)    Hallucinations    Allergies as of 09/07/2016      Reactions   Ace Inhibitors Other (See Comments)   Reaction unknown/ listed on MAR   Hydrochlorothiazide Other (See Comments)   Reaction unknown/ listed on MAR   Morphine And Related Other (See Comments)   Hallucinations      Medication List       Accurate as of 09/07/16  3:13 PM. Always use your most recent med list.          acetaminophen 500 MG tablet Commonly known as:  TYLENOL Take 1,000 mg by mouth at bedtime.   acetaminophen 325 MG tablet Commonly known  as:  TYLENOL Take 2 tablets (650 mg total) by mouth every 6 (six) hours as needed for mild pain (or Fever >/= 101).   aspirin EC 81 MG tablet Take 1 tablet (81 mg total) by mouth daily.   CALCIUM 600+D 600-200 MG-UNIT Tabs Generic drug:  Calcium Carbonate-Vitamin D Take 1 tablet by  mouth daily.   citalopram 10 MG tablet Commonly known as:  CELEXA Take one table once daily   cyanocobalamin 1000 MCG tablet Take 1,000 mcg by mouth. Take one tablet once daily for Vitamin B-12   HYDROcodone-acetaminophen 5-325 MG tablet Commonly known as:  NORCO/VICODIN Take 1 tablet by mouth every 6 (six) hours as needed for moderate pain.   ibuprofen 200 MG tablet Commonly known as:  ADVIL,MOTRIN Take 400 mg by mouth every 6 (six) hours as needed (for pain).   levothyroxine 25 MCG tablet Commonly known as:  SYNTHROID, LEVOTHROID Take 25 mcg by mouth daily before breakfast.   LORazepam 0.5 MG tablet Commonly known as:  ATIVAN Take 0.5 mg by mouth. Take one tablet every 8 hours as needed for anxiety   metoprolol tartrate 25 MG tablet Commonly known as:  LOPRESSOR Take 25 mg by mouth. Take 1/2 tablet (12.5 mg) twice a day   mineral oil-hydrophilic petrolatum ointment Apply 1 application topically daily as needed for dry skin.   mirtazapine 7.5 MG tablet Commonly known as:  REMERON Take 7.5 mg by mouth at bedtime.   polyethylene glycol packet Commonly known as:  MIRALAX / GLYCOLAX Take 17 g by mouth daily.   PROCTO-MED HC 2.5 % rectal cream Generic drug:  hydrocortisone Place 1 application rectally every 12 (twelve) hours as needed for hemorrhoids or itching.   RESOURCE 2.0 Liqd Take 120 mLs by mouth 2 (two) times daily between meals. At 10AM and 4PM   BENECALORIE PO Take 1.5 oz by mouth daily.   risperiDONE 0.5 MG tablet Commonly known as:  RISPERDAL Take 0.25 mg by mouth daily. Take one tablet at bedtime       Review of Systems  Unable to perform ROS: Dementia    Immunization History  Administered Date(s) Administered  . Influenza Whole 10/24/2011, 10/24/2012  . Influenza-Unspecified 11/06/2013, 11/04/2015  . PPD Test 12/28/2014, 01/11/2015  . Pneumococcal Polysaccharide-23 01/23/2009  . Td 07/14/2009   Pertinent  Health Maintenance Due  Topic  Date Due  . PNA vac Low Risk Adult (2 of 2 - PCV13) 01/23/2010  . INFLUENZA VACCINE  08/23/2016  . DEXA SCAN  01/24/2023 (Originally 07[birth year]26/1984)   Fall Risk  08/28/2016 03/20/2016 09/21/2015 06/15/2015 01/05/2015  Falls in the past year? Yes Yes No No No  Number falls in past yr: 2 or more - - - -  Injury with Fall? Yes Yes - - -  Comment - fracture right knee - - -  Risk Factor Category  - High Fall Risk - - -    Vitals:   09/07/16 1145  BP: (!) 152/78  Pulse: (!) 53  Resp: 12  Temp: 98.7 F (37.1 C)  SpO2: 96%  Weight: 109 lb 14.4 oz (49.9 kg)  Height: 5\' 2"  (1.575 m)   Body mass index is 20.1 kg/m. Physical Exam  Constitutional:  Thin frail elderly in no acute distress does not follow simple commands during visit   HENT:  Head: Normocephalic.  Eyes: Pupils are equal, round, and reactive to light. Conjunctivae are normal. Right eye exhibits no discharge. Left eye exhibits no discharge. No scleral icterus.  Neck: Normal range of motion. No JVD present.  Cardiovascular: Normal rate, regular rhythm, normal heart sounds and intact distal pulses.  Exam reveals no gallop and no friction rub.   No murmur heard. Pulmonary/Chest: Effort normal and breath sounds normal. No respiratory distress. She has no wheezes. She has no rales.  Abdominal: Soft. Bowel sounds are normal. She exhibits no distension. There is no tenderness. There is no rebound and no guarding.  Genitourinary:  Genitourinary Comments: Incontinent   Musculoskeletal: She exhibits no edema or tenderness.  Moves x 4 extremities.Uses wheelchair.   Lymphadenopathy:    She has no cervical adenopathy.  Neurological:  Alert to self and person. Does not follow simple commands. Previous MMSE 20/30 in 2017.   Skin: Skin is warm and dry. No rash noted. No erythema. No pallor.  Psychiatric: She has a normal mood and affect.  Pleasantly confused.     Labs reviewed:  Recent Labs  01/21/16 1815 01/22/16 0532  03/17/16 0907  03/18/16 0847 07/03/16  NA  --  137  < > 139 137 141  K  --  4.2  < > 3.3* 4.0 4.3  CL  --  103  --  102 102  --   CO2  --  27  --  29 26  --   GLUCOSE  --  85  --  97 110*  --   BUN  --  25*  < > 17 24* 22*  CREATININE  --  0.96  < > 0.62 1.06* 0.7  CALCIUM  --  8.6*  --  9.1 9.0  --   MG 1.9  --   --   --   --   --   < > = values in this interval not displayed.  Recent Labs  12/13/15 01/21/16 1412 03/17/16 0907  AST 18 19 18   ALT 14 16 13*  ALKPHOS 71 62 59  BILITOT  --  0.8 1.1  PROT  --  6.4* 6.9  ALBUMIN  --  3.4* 4.2    Recent Labs  11/10/15 1452  01/21/16 1412 01/22/16 0532  03/17/16 0907 03/18/16 0431 07/03/16  WBC 8.1  < > 10.4 7.5  < > 10.0 11.5* 7.0  NEUTROABS 5.8  --  8.0*  --   --  6.8  --   --   HGB 13.1  < > 12.0 11.9*  < > 13.8 13.1 12.3  HCT 38.5  < > 35.5* 35.3*  < > 40.5 38.5 36  MCV 98.2  --  98.3 97.8  --  96.4 96.3  --   PLT 343  < > 363 327  < > 337 355 328  < > = values in this interval not displayed. Lab Results  Component Value Date   TSH 2.38 07/03/2016   Lab Results  Component Value Date   HGBA1C 5.5 07/06/2016   Lab Results  Component Value Date   CHOL 168 08/03/2016   HDL 56 08/03/2016   LDLCALC 93 08/03/2016   TRIG 96 08/03/2016   CHOLHDL 3.0 12/11/2014   Significant Diagnostic Results in last 30 days:  No results found.  Assessment/Plan  Senile dementia without behavioral disturbance Worsening cognitive impairment reported.Resist care and anxious at times per staff. No combative behaviors.Unable to follow simple commands during visit. Will not order MMSE screening for now due to inability to follow simple commands. Continue to reorient and assist with ADL's. Continue on Risperidone and Ativan.    Family/ staff  Communication: Reviewed plan of care with patient and facility Nurse.   Labs/tests ordered: None   Mikhail Hallenbeck C Sheneka Schrom, NP

## 2016-09-13 ENCOUNTER — Non-Acute Institutional Stay (SKILLED_NURSING_FACILITY): Payer: Medicare Other | Admitting: Family

## 2016-09-13 ENCOUNTER — Encounter: Payer: Self-pay | Admitting: Family

## 2016-09-13 DIAGNOSIS — I10 Essential (primary) hypertension: Secondary | ICD-10-CM

## 2016-09-13 DIAGNOSIS — R634 Abnormal weight loss: Secondary | ICD-10-CM | POA: Diagnosis not present

## 2016-09-13 DIAGNOSIS — R400 Somnolence: Secondary | ICD-10-CM

## 2016-09-13 DIAGNOSIS — F411 Generalized anxiety disorder: Secondary | ICD-10-CM | POA: Diagnosis not present

## 2016-09-13 DIAGNOSIS — F03918 Unspecified dementia, unspecified severity, with other behavioral disturbance: Secondary | ICD-10-CM

## 2016-09-13 DIAGNOSIS — F0391 Unspecified dementia with behavioral disturbance: Secondary | ICD-10-CM

## 2016-09-13 MED ORDER — LORAZEPAM 0.5 MG PO TABS: 0.2500 mg | ORAL_TABLET | Freq: Three times a day (TID) | ORAL | Status: DC | PRN

## 2016-09-13 NOTE — Progress Notes (Signed)
Location:  [place] Room Number: 95 Place of Service:  SNF (31) Provider: Burle Kwan FNP-C  Blanchie Serve, MD  Patient Care Team: Blanchie Serve, MD as PCP - General (Internal Medicine) Melina Modena, [facility] Gaynelle Arabian, MD as Consulting Physician (Orthopedic Surgery) Andrya Roppolo, Nelda Bucks, NP as Nurse Practitioner (Family Medicine)  Extended Emergency Contact Information Primary Emergency Contact: Peri Jefferson [place] of [place] Mobile Phone: [phone] Relation: Daughter Secondary Emergency Contact: Kerby Less [place] of [place] Phone: [phone] Relation: Daughter  Code Status:  DNR  Goals of care: Advanced Directive information Advanced Directives 09/13/2016  Does Patient Have a Medical Advance Directive? Yes  Type of Advance Directive Out of facility DNR (pink MOST or yellow form);Living will;Healthcare Power of Attorney  Does patient want to make changes to medical advance directive? -  Copy of [place] in Chart? Yes  Pre-existing out of facility DNR order (yellow form or pink MOST form) Yellow form placed in chart (order not valid for inpatient use)     Chief Complaint  Patient presents with  . Acute Visit    Medication management    HPI:  Pt is a 83 y.o. female seen today at [facility] for an acute visit for medication evaluation. She has a medical history of HTN, TIA, Hypothyroidism,senile dementia, GAD, Depression among other conditions.She is seen in her room today.Facility Staff reports patient's daughter request medication evaluation due to increased sleepiness with ativan.She suggest increase dose of risperidone or try Abilify.Facility nurse reports patient tends to be sleepy in the mornings but gets very confused at around 3 pm waiting to leave the facility. No recent fall episode. She has had progressive weight loss over one month. She is unable to participate in HPI or ROS during the  visit but opens eyes then falls back to sleep.Patient's daughter Mont Dutton called at 1 pm at [phone] discussed with her antipsychotic and high risk of death in dementia patient.Ms. Ginette Otto verbalized understanding and agrees with provider. Provider recommended increasing dose of Celexa to 20 mg Tablet for GAD and decrease Ativan to 0.25 mg Tablet every 8 hours as needed to prevent increased sleepiness during the day. Patient's daughter in agreement with plan.    Past Medical History:  Diagnosis Date  . Abnormality of gait 03/15/2010  . Anxiety state, unspecified 03/15/2010  . Depression, major 06/15/2015  . Depressive disorder, not elsewhere classified 03/15/2010  . Dysphagia, unspecified(787.20) 03/15/2010  . Generalized anxiety disorder 06/23/2014  . Herpes zoster without mention of complication 5/78/4696  . Other alteration of consciousness 03/15/2010  . Pain in hand 08/13/12  . Personal history of fall 03/15/2010  . Senile osteoporosis 03/15/2010  . Unspecified constipation 03/15/2010  . Unspecified essential hypertension 03/15/2010  . Unspecified hearing loss 08/22/2011  . Unspecified urinary incontinence 03/15/2010  . Urinary incontinence 03/15/2010  . Vaginal prolapse 06/15/2015   Past Surgical History:  Procedure Laterality Date  . INCISION / DRAINAGE HAND / FINGER Right 06/2008   hand  . SPINE SURGERY  12/2006    Allergies  Allergen Reactions  . Ace Inhibitors Other (See Comments)    Reaction unknown/ listed on MAR  . Hydrochlorothiazide Other (See Comments)    Reaction unknown/ listed on MAR  . Morphine And Related Other (See Comments)    Hallucinations    Allergies as of 09/13/2016      Reactions   Ace Inhibitors Other (See Comments)   Reaction unknown/ listed on [facility]  Hydrochlorothiazide Other (See Comments)   Reaction unknown/ listed on MAR   Morphine And Related Other (See Comments)   Hallucinations      Medication List       Accurate as of 09/13/16 12:24 PM.  Always use your most recent med list.          acetaminophen 500 MG tablet Commonly known as:  TYLENOL Take 1,000 mg by mouth at bedtime.   acetaminophen 325 MG tablet Commonly known as:  TYLENOL Take 2 tablets (650 mg total) by mouth every 6 (six) hours as needed for mild pain (or Fever >/= 101).   aspirin EC 81 MG tablet Take 1 tablet (81 mg total) by mouth daily.   CALCIUM 600+D 600-200 MG-UNIT Tabs Generic drug:  Calcium Carbonate-Vitamin D Take 1 tablet by mouth daily.   citalopram 10 MG tablet Commonly known as:  CELEXA Take one table once daily   cyanocobalamin 1000 MCG tablet Take 1,000 mcg by mouth. Take one tablet once daily for Vitamin B-12   HYDROcodone-acetaminophen 5-325 MG tablet Commonly known as:  NORCO/VICODIN Take 1 tablet by mouth every 6 (six) hours as needed for moderate pain.   ibuprofen 200 MG tablet Commonly known as:  ADVIL,MOTRIN Take 400 mg by mouth every 6 (six) hours as needed (for pain).   levothyroxine 25 MCG tablet Commonly known as:  SYNTHROID, LEVOTHROID Take 25 mcg by mouth daily before breakfast.   LORazepam 0.5 MG tablet Commonly known as:  ATIVAN Take 0.5 mg by mouth. Take one tablet every 8 hours as needed for anxiety   metoprolol tartrate 25 MG tablet Commonly known as:  LOPRESSOR Take 25 mg by mouth. Take 1/2 tablet (12.5 mg) twice a day   mineral oil-hydrophilic petrolatum ointment Apply 1 application topically daily as needed for dry skin.   mirtazapine 7.5 MG tablet Commonly known as:  REMERON Take 7.5 mg by mouth at bedtime.   polyethylene glycol packet Commonly known as:  MIRALAX / GLYCOLAX Take 17 g by mouth daily.   PROCTO-MED HC 2.5 % rectal cream Generic drug:  hydrocortisone Place 1 application rectally every 12 (twelve) hours as needed for hemorrhoids or itching.   RESOURCE 2.0 Liqd Take 120 mLs by mouth 2 (two) times daily between meals. At 10AM and 4PM   BENECALORIE PO Take 1.5 oz by mouth  daily.   risperiDONE 0.5 MG tablet Commonly known as:  RISPERDAL Take 0.25 mg by mouth daily. Take one tablet at bedtime       Review of Systems  Unable to perform ROS: Dementia    Immunization History  Administered Date(s) Administered  . Influenza Whole 10/24/2011, 10/24/2012  . Influenza-Unspecified 11/06/2013, 11/04/2015  . PPD Test 12/28/2014, 01/11/2015  . Pneumococcal Polysaccharide-23 01/23/2009  . Td 07/14/2009   Pertinent  Health Maintenance Due  Topic Date Due  . PNA vac Low Risk Adult (2 of 2 - PCV13) 01/23/2010  . INFLUENZA VACCINE  08/23/2016  . DEXA SCAN  01/24/2023 (Originally 07[birth year]26/1984)   Fall Risk  08/28/2016 03/20/2016 09/21/2015 06/15/2015 01/05/2015  Falls in the past year? Yes Yes No No No  Number falls in past yr: 2 or more - - - -  Injury with Fall? Yes Yes - - -  Comment - fracture right knee - - -  Risk Factor Category  - High Fall Risk - - -    Vitals:   09/13/16 0955  BP: (!) 147/87  Pulse: 71  Resp: 14  Temp: (!)  93.3 F (34.1 C)  SpO2: 93%  Weight: 108 lb 11.2 oz (49.3 kg)  Height: 5\' 2"  (1.575 m)   Body mass index is 19.88 kg/m. Physical Exam  Constitutional: No distress.  Thin frail elderly sleepy during visit opens eyes but falls back to sleep.  HENT:  Head: Normocephalic.  Left hearing aid in place during visit   Eyes: Pupils are equal, round, and reactive to light. Conjunctivae are normal. Right eye exhibits no discharge. Left eye exhibits no discharge. No scleral icterus.  Neck: Normal range of motion. No JVD present.  Cardiovascular: Normal rate, regular rhythm, normal heart sounds and intact distal pulses.  Exam reveals no gallop and no friction rub.   No murmur heard. Pulmonary/Chest: Effort normal and breath sounds normal. No respiratory distress. She has no wheezes. She has no rales.  Abdominal: Soft. Bowel sounds are normal. She exhibits no distension. There is no tenderness. There is no rebound and no guarding.   Genitourinary:  Genitourinary Comments: Incontinent for both bowel and bladder  Musculoskeletal: She exhibits no edema or tenderness.  Moves x 4 extremities. Unsteady gait spends most time on wheelchair.   Lymphadenopathy:    She has no cervical adenopathy.  Neurological: She is alert.  Asleep during visit opens eyes but falls back to sleep.   Skin: Skin is warm and dry. No rash noted. No erythema. No pallor.  Psychiatric: She has a normal mood and affect.  Sleepy during visit    Labs reviewed:  Recent Labs  01/21/16 1815 01/22/16 0532  03/17/16 0907 03/18/16 0847 07/03/16  NA  --  137  < > 139 137 141  K  --  4.2  < > 3.3* 4.0 4.3  CL  --  103  --  102 102  --   CO2  --  27  --  29 26  --   GLUCOSE  --  85  --  97 110*  --   BUN  --  25*  < > 17 24* 22*  CREATININE  --  0.96  < > 0.62 1.06* 0.7  CALCIUM  --  8.6*  --  9.1 9.0  --   MG 1.9  --   --   --   --   --   < > = values in this interval not displayed.  Recent Labs  12/13/15 01/21/16 1412 03/17/16 0907  AST 18 19 18   ALT 14 16 13*  ALKPHOS 71 62 59  BILITOT  --  0.8 1.1  PROT  --  6.4* 6.9  ALBUMIN  --  3.4* 4.2    Recent Labs  11/10/15 1452  01/21/16 1412 01/22/16 0532  03/17/16 0907 03/18/16 0431 07/03/16  WBC 8.1  < > 10.4 7.5  < > 10.0 11.5* 7.0  NEUTROABS 5.8  --  8.0*  --   --  6.8  --   --   HGB 13.1  < > 12.0 11.9*  < > 13.8 13.1 12.3  HCT 38.5  < > 35.5* 35.3*  < > 40.5 38.5 36  MCV 98.2  --  98.3 97.8  --  96.4 96.3  --   PLT 343  < > 363 327  < > 337 355 328  < > = values in this interval not displayed. Lab Results  Component Value Date   TSH 2.38 07/03/2016   Lab Results  Component Value Date   HGBA1C 5.5 07/06/2016   Lab Results  Component Value  Date   CHOL 168 08/03/2016   HDL 56 08/03/2016   LDLCALC 93 08/03/2016   TRIG 96 08/03/2016   CHOLHDL 3.0 12/11/2014    Significant Diagnostic Results in last 30 days:  No results found.  Assessment/Plan 1. Essential  hypertension B/p stable with occasional readings in the 160's/90's-170's/90's in the mornings. Continue on Metoprolol 25 mg Tablet daily. Continue to monitor for now.    2. Generalized anxiety disorder Has required Ativan daily 09/06/16;09/09/16; 09/10/16; 8/21 and 09/13/16. Increased sleepiness reported. Will decrease Ativan to 0.5 mg Tablet take 1/2 Tablet ( 0.25 mg Tablet) by mouth every 8 hours as needed. Increase Celexa to 20 mg Tablet daily     3. Senile dementia with behavioral disturbance Increased confusion reported in the afternoon though sleepy in the morning. Has had 3.8 lbs of weight loss over one month. Adjust Ativan and Celexa as above. Continue on Risperdal 0.25 mg tablet daily and Remeron 7.5 mg Tablet daily at bedtime. Continue to assist with ADL's and encourage oral intake.   4. Weight loss   Has had 3.8 lbs of weight loss over one month. Per daughter patient sleeps during the day and not eating. Adjust ativan and Celexa as above.continue on Remeron 7.5 mg tablet at bedtime. Continue on Nutrition supplements and follow up with facility registered dietician. Continue to monitor weight.   Family/ staff Communication: Reviewed plan of care with patient's daughter Nurse, children's and facility Nurse.   Labs/tests ordered: None   Tjay Velazquez C Cortez Steelman, NP

## 2016-09-18 ENCOUNTER — Non-Acute Institutional Stay (SKILLED_NURSING_FACILITY): Payer: Medicare Other | Admitting: Internal Medicine

## 2016-09-18 ENCOUNTER — Encounter: Payer: Self-pay | Admitting: Internal Medicine

## 2016-09-18 DIAGNOSIS — R2681 Unsteadiness on feet: Secondary | ICD-10-CM | POA: Diagnosis not present

## 2016-09-18 DIAGNOSIS — S41112A Laceration without foreign body of left upper arm, initial encounter: Secondary | ICD-10-CM

## 2016-09-18 DIAGNOSIS — E039 Hypothyroidism, unspecified: Secondary | ICD-10-CM | POA: Diagnosis not present

## 2016-09-18 DIAGNOSIS — W19XXXA Unspecified fall, initial encounter: Secondary | ICD-10-CM | POA: Diagnosis not present

## 2016-09-18 DIAGNOSIS — E43 Unspecified severe protein-calorie malnutrition: Secondary | ICD-10-CM | POA: Diagnosis not present

## 2016-09-18 DIAGNOSIS — I10 Essential (primary) hypertension: Secondary | ICD-10-CM

## 2016-09-18 DIAGNOSIS — F0280 Dementia in other diseases classified elsewhere without behavioral disturbance: Secondary | ICD-10-CM | POA: Insufficient documentation

## 2016-09-18 DIAGNOSIS — G309 Alzheimer's disease, unspecified: Secondary | ICD-10-CM

## 2016-09-18 DIAGNOSIS — F0281 Dementia in other diseases classified elsewhere with behavioral disturbance: Secondary | ICD-10-CM | POA: Diagnosis not present

## 2016-09-18 NOTE — Progress Notes (Signed)
Location:  [facility] Room Number: 13 Place of Service:  SNF [phone]) Provider:  Kentavius Dettore Molly Maduro, Karalee Height, MD  Patient Care Team: Blanchie Serve, MD as PCP - General (Internal Medicine) Melina Modena, [facility] Gaynelle Arabian, MD as Consulting Physician (Orthopedic Surgery) Ngetich, Nelda Bucks, NP as Nurse Practitioner (Family Medicine)  Extended Emergency Contact Information Primary Emergency Contact: Peri Jefferson [place] of [place] Mobile Phone: [phone] Relation: Daughter Secondary Emergency Contact: Kerby Less [place] of [place] Phone: [phone] Relation: Daughter  Code Status:  DNR Goals of care: Advanced Directive information Advanced Directives 09/18/2016  Does Patient Have a Medical Advance Directive? Yes  Type of Paramedic of [place];Living will;Out of facility DNR (pink MOST or yellow form)  Does patient want to make changes to medical advance directive? -  Copy of [place] in Chart? -  Pre-existing out of facility DNR order (yellow form or pink MOST form) -     Chief Complaint  Patient presents with  . Medical Management of Chronic Issues    Routine Visit     HPI:  Pt is a 83 y.o. female seen today for medical management of chronic diseases. She had a fall on 09/16/16 and sustained skin tear. She has advanced dementia and does not participate in HPI and ROS. Per nursing, she has behavior changes towards later part of the afternoon where she is seen going to other resident's room. She is awake for most part of the night and can get agitated. She sleeps for morning part of the day and has also refused medications at times. She is a high fall risk. She is wheelchair bound and needs assistance with transfer. On chart review, she has elevated BP readings.   Past Medical History:  Diagnosis Date  . Abnormality of gait 03/15/2010  . Anxiety state, unspecified 03/15/2010  .  Depression, major 06/15/2015  . Depressive disorder, not elsewhere classified 03/15/2010  . Dysphagia, unspecified(787.20) 03/15/2010  . Generalized anxiety disorder 06/23/2014  . Herpes zoster without mention of complication 9/56/3875  . Other alteration of consciousness 03/15/2010  . Pain in hand 08/13/12  . Personal history of fall 03/15/2010  . Senile osteoporosis 03/15/2010  . Unspecified constipation 03/15/2010  . Unspecified essential hypertension 03/15/2010  . Unspecified hearing loss 08/22/2011  . Unspecified urinary incontinence 03/15/2010  . Urinary incontinence 03/15/2010  . Vaginal prolapse 06/15/2015   Past Surgical History:  Procedure Laterality Date  . INCISION / DRAINAGE HAND / FINGER Right 06/2008   hand  . SPINE SURGERY  12/2006    Allergies  Allergen Reactions  . Ace Inhibitors Other (See Comments)    Reaction unknown/ listed on MAR  . Hydrochlorothiazide Other (See Comments)    Reaction unknown/ listed on MAR  . Morphine And Related Other (See Comments)    Hallucinations    Outpatient Encounter Prescriptions as of 09/18/2016  Medication Sig  . acetaminophen (TYLENOL) 325 MG tablet Take 2 tablets (650 mg total) by mouth every 6 (six) hours as needed for mild pain (or Fever >/= 101).  Marland Kitchen acetaminophen (TYLENOL) 500 MG tablet Take 1,000 mg by mouth at bedtime.  Marland Kitchen aspirin EC 81 MG tablet Take 1 tablet (81 mg total) by mouth daily.  . Calcium Carbonate-Vitamin D (CALCIUM 600+D) 600-200 MG-UNIT TABS Take 1 tablet by mouth daily.   . citalopram (CELEXA) 20 MG tablet Take 20 mg by mouth daily.  . cyanocobalamin 1000 MCG tablet Take 1,000 mcg  by mouth daily.   Marland Kitchen HYDROcodone-acetaminophen (NORCO/VICODIN) 5-325 MG tablet Take 1 tablet by mouth every 6 (six) hours as needed for moderate pain.  . hydrocortisone (PROCTO-MED HC) 2.5 % rectal cream Place 1 application rectally every 12 (twelve) hours as needed for hemorrhoids or itching.  Marland Kitchen ibuprofen (ADVIL,MOTRIN) 200 MG tablet Take  400 mg by mouth every 6 (six) hours as needed (for pain).  Marland Kitchen levothyroxine (SYNTHROID, LEVOTHROID) 25 MCG tablet Take 25 mcg by mouth daily before breakfast.  . LORazepam (ATIVAN) 0.5 MG tablet Take 0.5 tablets (0.25 mg total) by mouth every 8 (eight) hours as needed for anxiety. Take one tablet every 8 hours as needed for anxiety  . metoprolol tartrate (LOPRESSOR) 25 MG tablet Take 12.5 mg by mouth 2 (two) times daily.   . mineral oil-hydrophilic petrolatum (AQUAPHOR) ointment Apply 1 application topically daily as needed for dry skin.  . mirtazapine (REMERON) 7.5 MG tablet Take 7.5 mg by mouth at bedtime.  . Nutritional Supplements (RESOURCE 2.0) LIQD Take 120 mLs by mouth 2 (two) times daily between meals. At 10AM and 4PM  . polyethylene glycol (MIRALAX / GLYCOLAX) packet Take 17 g by mouth daily.  . risperiDONE (RISPERDAL) 0.5 MG tablet Take 0.25 mg by mouth at bedtime.   . [DISCONTINUED] citalopram (CELEXA) 10 MG tablet Take one table once daily  . [DISCONTINUED] Nutritional Supplements (BENECALORIE PO) Take 1.5 oz by mouth daily.   No facility-administered encounter medications on file as of 09/18/2016.     Review of Systems  Unable to perform ROS: Dementia (obtained information from nursing and patient's chart)    Immunization History  Administered Date(s) Administered  . Influenza Whole 10/24/2011, 10/24/2012  . Influenza-Unspecified 11/06/2013, 11/04/2015  . PPD Test 12/28/2014, 01/11/2015  . Pneumococcal Polysaccharide-23 01/23/2009  . Td 07/14/2009   Pertinent  Health Maintenance Due  Topic Date Due  . PNA vac Low Risk Adult (2 of 2 - PCV13) 01/23/2010  . INFLUENZA VACCINE  08/23/2016  . DEXA SCAN  01/24/2023 (Originally 07/26/1984)   Fall Risk  08/28/2016 03/20/2016 09/21/2015 06/15/2015 01/05/2015  Falls in the past year? Yes Yes No No No  Number falls in past yr: 2 or more - - - -  Injury with Fall? Yes Yes - - -  Comment - fracture right knee - - -  Risk Factor Category   - High Fall Risk - - -   Functional Status Survey:    Vitals:   09/18/16 1016  BP: 128/61  Pulse: (!) 52  Resp: 16  Temp: 97.8 F (36.6 C)  TempSrc: Oral  Weight: 108 lb 11.2 oz (49.3 kg)  Height: 5\' 2"  (1.575 m)   Body mass index is 19.88 kg/m. Physical Exam  Constitutional: No distress.  Thin built and frail  HENT:  Head: Normocephalic and atraumatic.  Mouth/Throat: Oropharynx is clear and moist.  Hard of hearing and has hearing aid to left ear  Eyes: Pupils are equal, round, and reactive to light. Conjunctivae are normal.  Neck: Normal range of motion. Neck supple.  Cardiovascular: Normal rate and regular rhythm.   Pulmonary/Chest: Effort normal and breath sounds normal. She has no wheezes. She has no rales.  Abdominal: Soft. Bowel sounds are normal. She exhibits no distension. There is no tenderness. There is no guarding.  Musculoskeletal: She exhibits no edema.  Kyphosis and scoliosis present. Trigger finger to right 3rd and 4th finger with contracture, arthritis changes to her fingers. She gets around on wheelchair. High fall  risk.   Lymphadenopathy:    She has no cervical adenopathy.  Neurological: She is alert.  Oriented to self only.   Skin: Skin is warm and dry. She is not diaphoretic.  Bruises to both arms, skin tear to left forehead and LUE with dressing in place  Psychiatric:  Pleasantly confused, does not participate in conversation, follows simple commands    Labs reviewed:  Recent Labs  01/21/16 1815 01/22/16 0532  03/17/16 0907 03/18/16 0847 07/03/16  NA  --  137  < > 139 137 141  K  --  4.2  < > 3.3* 4.0 4.3  CL  --  103  --  102 102  --   CO2  --  27  --  29 26  --   GLUCOSE  --  85  --  97 110*  --   BUN  --  25*  < > 17 24* 22*  CREATININE  --  0.96  < > 0.62 1.06* 0.7  CALCIUM  --  8.6*  --  9.1 9.0  --   MG 1.9  --   --   --   --   --   < > = values in this interval not displayed.  Recent Labs  12/13/15 01/21/16 1412 03/17/16 0907   AST 18 19 18   ALT 14 16 13*  ALKPHOS 71 62 59  BILITOT  --  0.8 1.1  PROT  --  6.4* 6.9  ALBUMIN  --  3.4* 4.2    Recent Labs  11/10/15 1452  01/21/16 1412 01/22/16 0532  03/17/16 0907 03/18/16 0431 07/03/16  WBC 8.1  < > 10.4 7.5  < > 10.0 11.5* 7.0  NEUTROABS 5.8  --  8.0*  --   --  6.8  --   --   HGB 13.1  < > 12.0 11.9*  < > 13.8 13.1 12.3  HCT 38.5  < > 35.5* 35.3*  < > 40.5 38.5 36  MCV 98.2  --  98.3 97.8  --  96.4 96.3  --   PLT 343  < > 363 327  < > 337 355 328  < > = values in this interval not displayed. Lab Results  Component Value Date   TSH 2.38 07/03/2016   Lab Results  Component Value Date   HGBA1C 5.5 07/06/2016   Lab Results  Component Value Date   CHOL 168 08/03/2016   HDL 56 08/03/2016   LDLCALC 93 08/03/2016   TRIG 96 08/03/2016   CHOLHDL 3.0 12/11/2014    Significant Diagnostic Results in last 30 days:  No results found.  Assessment/Plan  Essential Hypertension Elevated BP reading on review of 153/67, 174/90, 164/102, 161/92 among others. Currently on metoprolol tartrate 12.5 mg bid. She has refused medication at times which could contribute to this. Increase metoprolol tartrate to 25 mg in am and 12.5 mg at night with holding parameters. Check BP twice a day x 2 weeks to assess further.   Fall initial encounter Fall on 8/25. Able to move all 4 extremities. Bruise to her arms and skin tear noted. neurochecks per facility protocol has been stable. Poor safety awareness, unable to work with therapy team as will not follow instructions with dementia limiting it. Monitor clinically. D/c prn norco for now. Continue tylenol and monitor  Unsteady gait Had a fall yesterday with no apparent injury. She is a high fall risk. Fall precautions to be taken. Wheelchair bound and needs assistance with  transfers. With dementia she remains a fall risk.   Skin tear To left arm and left forehead, clean area with normal saline, pat dry, apply steri strip after  approximating the edges, apply non skin adherent dressing and monitor for signs of infection.    Dementia with behavioral disturbance Advanced and decline anticipated. Currently on risperidone 0.25 mg qhs, change administration time to 2 pm and monitor. Continue prn ativan for severe anxiety. Continue celexa and remeron. See below  Protein calorie malnutrition Wt Readings from Last 3 Encounters:  09/18/16 108 lb 11.2 oz (49.3 kg)  09/13/16 108 lb 11.2 oz (49.3 kg)  09/07/16 109 lb 14.4 oz (49.9 kg)   Severe and decline anticipated. Continues to lose weight. Currently on nutritional supplement. Increase remeron to 15 mg daily and monitor.   Hypothyroidism Lab Results  Component Value Date   TSH 2.38 07/03/2016   Continue levothyroxine 25 mcg daily and monitor   Family/ staff Communication: reviewed care plan with patient and charge nurse  Labs/tests ordered:  none  Blanchie Serve, MD Internal Medicine [address] [place], [place] [address] Cell Phone (Monday-Friday 8 am - 5 pm): [phone] On Call: [phone] and follow prompts after 5 pm and on weekends Office Phone: [phone] Office Fax: [phone]

## 2016-09-19 ENCOUNTER — Non-Acute Institutional Stay (SKILLED_NURSING_FACILITY): Payer: Medicare Other | Admitting: Family

## 2016-09-19 ENCOUNTER — Encounter: Payer: Self-pay | Admitting: Family

## 2016-09-19 DIAGNOSIS — M6530 Trigger finger, unspecified finger: Secondary | ICD-10-CM | POA: Diagnosis not present

## 2016-09-19 DIAGNOSIS — M24541 Contracture, right hand: Secondary | ICD-10-CM

## 2016-09-19 NOTE — Progress Notes (Signed)
Location:  [facility] Room Number: 22 Place of Service:  SNF (31) Provider: Catherine Oak FNP-C  Blanchie Serve, MD  Patient Care Team: Blanchie Serve, MD as PCP - General (Internal Medicine) Melina Modena, [facility] Gaynelle Arabian, MD as Consulting Physician (Orthopedic Surgery) Nicola Heinemann, Nelda Bucks, NP as Nurse Practitioner (Family Medicine)  Extended Emergency Contact Information Primary Emergency Contact: Peri Jefferson [place] of [place] Mobile Phone: [phone] Relation: Daughter Secondary Emergency Contact: Kerby Less States of [place] Phone: [phone] Relation: Daughter  Code Status: DNR  Goals of care: Advanced Directive information Advanced Directives 09/19/2016  Does Patient Have a Medical Advance Directive? Yes  Type of Advance Directive Out of facility DNR (pink MOST or yellow form);Living will;Healthcare Power of Attorney  Does patient want to make changes to medical advance directive? -  Copy of [place] in Chart? Yes  Pre-existing out of facility DNR order (yellow form or pink MOST form) Yellow form placed in chart (order not valid for inpatient use)     Chief Complaint  Patient presents with  . Acute Visit    eval for therapy for finger/hand contracture    HPI:  Pt is a 83 y.o. female seen today at [facility] for an acute visit for evaluation of right fingers contracture. She has a significant medical history of right triger finger. HPI and ROS limited due to cognitive impairment. Facility Nurse reports no signs of pain.    Past Medical History:  Diagnosis Date  . Abnormality of gait 03/15/2010  . Anxiety state, unspecified 03/15/2010  . Depression, major 06/15/2015  . Depressive disorder, not elsewhere classified 03/15/2010  . Dysphagia, unspecified(787.20) 03/15/2010  . Generalized anxiety disorder 06/23/2014  . Herpes zoster without mention of complication 01/24/5850  . Other alteration of  consciousness 03/15/2010  . Pain in hand 08/13/12  . Personal history of fall 03/15/2010  . Senile osteoporosis 03/15/2010  . Unspecified constipation 03/15/2010  . Unspecified essential hypertension 03/15/2010  . Unspecified hearing loss 08/22/2011  . Unspecified urinary incontinence 03/15/2010  . Urinary incontinence 03/15/2010  . Vaginal prolapse 06/15/2015   Past Surgical History:  Procedure Laterality Date  . INCISION / DRAINAGE HAND / FINGER Right 06/2008   hand  . SPINE SURGERY  12/2006    Allergies  Allergen Reactions  . Ace Inhibitors Other (See Comments)    Reaction unknown/ listed on MAR  . Hydrochlorothiazide Other (See Comments)    Reaction unknown/ listed on MAR  . Morphine And Related Other (See Comments)    Hallucinations    Allergies as of 09/19/2016      Reactions   Ace Inhibitors Other (See Comments)   Reaction unknown/ listed on MAR   Hydrochlorothiazide Other (See Comments)   Reaction unknown/ listed on MAR   Morphine And Related Other (See Comments)   Hallucinations      Medication List       Accurate as of 09/19/16  3:22 PM. Always use your most recent med list.          acetaminophen 500 MG tablet Commonly known as:  TYLENOL Take 1,000 mg by mouth at bedtime.   acetaminophen 325 MG tablet Commonly known as:  TYLENOL Take 2 tablets (650 mg total) by mouth every 6 (six) hours as needed for mild pain (or Fever >/= 101).   aspirin EC 81 MG tablet Take 1 tablet (81 mg total) by mouth daily.   CALCIUM 600+D 600-200 MG-UNIT Tabs Generic  drug:  Calcium Carbonate-Vitamin D Take 1 tablet by mouth daily.   citalopram 20 MG tablet Commonly known as:  CELEXA Take 20 mg by mouth daily.   cyanocobalamin 1000 MCG tablet Take 1,000 mcg by mouth daily.   ibuprofen 200 MG tablet Commonly known as:  ADVIL,MOTRIN Take 400 mg by mouth every 6 (six) hours as needed (for pain).   levothyroxine 25 MCG tablet Commonly known as:  SYNTHROID, LEVOTHROID Take 25  mcg by mouth daily before breakfast.   LORazepam 0.5 MG tablet Commonly known as:  ATIVAN Take 0.5 tablets (0.25 mg total) by mouth every 8 (eight) hours as needed for anxiety. Take one tablet every 8 hours as needed for anxiety   metoprolol tartrate 25 MG tablet Commonly known as:  LOPRESSOR Take 25 mg by mouth 2 (two) times daily. 25mg  in the AM and 12.5 in the PM. Hold if SBP < 110   mineral oil-hydrophilic petrolatum ointment Apply 1 application topically daily as needed for dry skin.   mirtazapine 15 MG tablet Commonly known as:  REMERON Take 15 mg by mouth at bedtime.   polyethylene glycol packet Commonly known as:  MIRALAX / GLYCOLAX Take 17 g by mouth daily.   PROCTO-MED HC 2.5 % rectal cream Generic drug:  hydrocortisone Place 1 application rectally every 12 (twelve) hours as needed for hemorrhoids or itching.   RESOURCE 2.0 Liqd Take 120 mLs by mouth 2 (two) times daily between meals. At 10AM and 4PM   risperiDONE 0.5 MG tablet Commonly known as:  RISPERDAL Take 0.25 mg by mouth daily. At [facility]       Review of Systems  Unable to perform ROS: Dementia    Immunization History  Administered Date(s) Administered  . Influenza Whole 10/24/2011, 10/24/2012  . Influenza-Unspecified 11/06/2013, 11/04/2015  . PPD Test 07/27/2009, 12/28/2014, 01/11/2015  . Pneumococcal Polysaccharide-23 01/23/2009  . Pneumococcal-Unspecified 01/23/2005  . Td 07/14/2009   Pertinent  Health Maintenance Due  Topic Date Due  . PNA vac Low Risk Adult (2 of 2 - PCV13) 01/23/2010  . INFLUENZA VACCINE  08/23/2016  . DEXA SCAN  01/24/2023 (Originally 07/26/1984)   Fall Risk  08/28/2016 03/20/2016 09/21/2015 06/15/2015 01/05/2015  Falls in the past year? Yes Yes No No No  Number falls in past yr: 2 or more - - - -  Injury with Fall? Yes Yes - - -  Comment - fracture right knee - - -  Risk Factor Category  - High Fall Risk - - -   Functional Status Survey:    Vitals:   09/19/16 1211  BP:  138/83  Pulse: 71  Resp: 14  Temp: (!) 93.3 F (34.1 C)  SpO2: 93%  Weight: 107 lb 12.8 oz (48.9 kg)  Height: 5\' 2"  (1.575 m)   Body mass index is 19.72 kg/m. Physical Exam  Constitutional:  Thin frail elderly in no acute distress   Cardiovascular: Normal rate, regular rhythm, normal heart sounds and intact distal pulses.  Exam reveals no gallop and no friction rub.   No murmur heard. Pulmonary/Chest: Effort normal and breath sounds normal. No respiratory distress. She has no wheezes. She has no rales.  Abdominal: Soft. Bowel sounds are normal. She exhibits no distension. There is no tenderness. There is no rebound and no guarding.  Musculoskeletal: She exhibits no edema or tenderness.  Moves x 4 extremities except right 3rd and 4th finger contracture noted.   Neurological:  Alert person but nonverbal during visit   Skin:  Skin is warm and dry. No rash noted. No erythema. No pallor.  Psychiatric: She has a normal mood and affect.    Labs reviewed:  Recent Labs  01/21/16 1815 01/22/16 0532  03/17/16 0907 03/18/16 0847 07/03/16  NA  --  137  < > 139 137 141  K  --  4.2  < > 3.3* 4.0 4.3  CL  --  103  --  102 102  --   CO2  --  27  --  29 26  --   GLUCOSE  --  85  --  97 110*  --   BUN  --  25*  < > 17 24* 22*  CREATININE  --  0.96  < > 0.62 1.06* 0.7  CALCIUM  --  8.6*  --  9.1 9.0  --   MG 1.9  --   --   --   --   --   < > = values in this interval not displayed.  Recent Labs  12/13/15 01/21/16 1412 03/17/16 0907  AST 18 19 18   ALT 14 16 13*  ALKPHOS 71 62 59  BILITOT  --  0.8 1.1  PROT  --  6.4* 6.9  ALBUMIN  --  3.4* 4.2    Recent Labs  11/10/15 1452  01/21/16 1412 01/22/16 0532  03/17/16 0907 03/18/16 0431 07/03/16  WBC 8.1  < > 10.4 7.5  < > 10.0 11.5* 7.0  NEUTROABS 5.8  --  8.0*  --   --  6.8  --   --   HGB 13.1  < > 12.0 11.9*  < > 13.8 13.1 12.3  HCT 38.5  < > 35.5* 35.3*  < > 40.5 38.5 36  MCV 98.2  --  98.3 97.8  --  96.4 96.3  --   PLT 343   < > 363 327  < > 337 355 328  < > = values in this interval not displayed. Lab Results  Component Value Date   TSH 2.38 07/03/2016   Lab Results  Component Value Date   HGBA1C 5.5 07/06/2016   Lab Results  Component Value Date   CHOL 168 08/03/2016   HDL 56 08/03/2016   LDLCALC 93 08/03/2016   TRIG 96 08/03/2016   CHOLHDL 3.0 12/11/2014    Significant Diagnostic Results in last 30 days:  No results found.  Assessment/Plan 1. Contracture of joint of finger of right hand Right 3rd and 4 th finger.will have restorative occupation therapy to evaluate and treat as indicated.   2. Trigger finger Continue on ibuprofen as needed for pain.    Family/ staff Communication: Reviewed plan of care with patient and facility Nurse   Labs/tests ordered: None   Nelda Bucks Brandalynn Ofallon, NP

## 2016-09-21 DIAGNOSIS — R269 Unspecified abnormalities of gait and mobility: Secondary | ICD-10-CM | POA: Diagnosis not present

## 2016-09-21 DIAGNOSIS — G4700 Insomnia, unspecified: Secondary | ICD-10-CM | POA: Diagnosis not present

## 2016-09-21 DIAGNOSIS — I10 Essential (primary) hypertension: Secondary | ICD-10-CM | POA: Diagnosis not present

## 2016-09-21 DIAGNOSIS — F329 Major depressive disorder, single episode, unspecified: Secondary | ICD-10-CM | POA: Diagnosis not present

## 2016-09-21 DIAGNOSIS — H9190 Unspecified hearing loss, unspecified ear: Secondary | ICD-10-CM | POA: Diagnosis not present

## 2016-09-21 DIAGNOSIS — M24541 Contracture, right hand: Secondary | ICD-10-CM | POA: Diagnosis not present

## 2016-09-21 DIAGNOSIS — R29898 Other symptoms and signs involving the musculoskeletal system: Secondary | ICD-10-CM | POA: Diagnosis not present

## 2016-09-21 DIAGNOSIS — R32 Unspecified urinary incontinence: Secondary | ICD-10-CM | POA: Diagnosis not present

## 2016-09-21 DIAGNOSIS — F411 Generalized anxiety disorder: Secondary | ICD-10-CM | POA: Diagnosis not present

## 2016-09-21 DIAGNOSIS — M6530 Trigger finger, unspecified finger: Secondary | ICD-10-CM | POA: Diagnosis not present

## 2016-09-21 DIAGNOSIS — E876 Hypokalemia: Secondary | ICD-10-CM | POA: Diagnosis not present

## 2016-09-21 DIAGNOSIS — M79643 Pain in unspecified hand: Secondary | ICD-10-CM | POA: Diagnosis not present

## 2016-09-22 DIAGNOSIS — E876 Hypokalemia: Secondary | ICD-10-CM | POA: Diagnosis not present

## 2016-09-22 DIAGNOSIS — G4700 Insomnia, unspecified: Secondary | ICD-10-CM | POA: Diagnosis not present

## 2016-09-22 DIAGNOSIS — M24541 Contracture, right hand: Secondary | ICD-10-CM | POA: Diagnosis not present

## 2016-09-22 DIAGNOSIS — R29898 Other symptoms and signs involving the musculoskeletal system: Secondary | ICD-10-CM | POA: Diagnosis not present

## 2016-09-22 DIAGNOSIS — H9190 Unspecified hearing loss, unspecified ear: Secondary | ICD-10-CM | POA: Diagnosis not present

## 2016-09-22 DIAGNOSIS — F411 Generalized anxiety disorder: Secondary | ICD-10-CM | POA: Diagnosis not present

## 2016-09-26 DIAGNOSIS — M79643 Pain in unspecified hand: Secondary | ICD-10-CM | POA: Diagnosis not present

## 2016-09-26 DIAGNOSIS — R29898 Other symptoms and signs involving the musculoskeletal system: Secondary | ICD-10-CM | POA: Diagnosis not present

## 2016-09-26 DIAGNOSIS — I10 Essential (primary) hypertension: Secondary | ICD-10-CM | POA: Diagnosis not present

## 2016-09-26 DIAGNOSIS — M6530 Trigger finger, unspecified finger: Secondary | ICD-10-CM | POA: Diagnosis not present

## 2016-09-26 DIAGNOSIS — H9190 Unspecified hearing loss, unspecified ear: Secondary | ICD-10-CM | POA: Diagnosis not present

## 2016-09-26 DIAGNOSIS — M6281 Muscle weakness (generalized): Secondary | ICD-10-CM | POA: Diagnosis not present

## 2016-09-26 DIAGNOSIS — F329 Major depressive disorder, single episode, unspecified: Secondary | ICD-10-CM | POA: Diagnosis not present

## 2016-09-26 DIAGNOSIS — R269 Unspecified abnormalities of gait and mobility: Secondary | ICD-10-CM | POA: Diagnosis not present

## 2016-09-26 DIAGNOSIS — G4700 Insomnia, unspecified: Secondary | ICD-10-CM | POA: Diagnosis not present

## 2016-09-26 DIAGNOSIS — E876 Hypokalemia: Secondary | ICD-10-CM | POA: Diagnosis not present

## 2016-09-26 DIAGNOSIS — F411 Generalized anxiety disorder: Secondary | ICD-10-CM | POA: Diagnosis not present

## 2016-09-26 DIAGNOSIS — R32 Unspecified urinary incontinence: Secondary | ICD-10-CM | POA: Diagnosis not present

## 2016-09-27 DIAGNOSIS — G4700 Insomnia, unspecified: Secondary | ICD-10-CM | POA: Diagnosis not present

## 2016-09-27 DIAGNOSIS — E876 Hypokalemia: Secondary | ICD-10-CM | POA: Diagnosis not present

## 2016-09-27 DIAGNOSIS — R29898 Other symptoms and signs involving the musculoskeletal system: Secondary | ICD-10-CM | POA: Diagnosis not present

## 2016-09-27 DIAGNOSIS — H9190 Unspecified hearing loss, unspecified ear: Secondary | ICD-10-CM | POA: Diagnosis not present

## 2016-09-27 DIAGNOSIS — F411 Generalized anxiety disorder: Secondary | ICD-10-CM | POA: Diagnosis not present

## 2016-09-27 DIAGNOSIS — M6281 Muscle weakness (generalized): Secondary | ICD-10-CM | POA: Diagnosis not present

## 2016-09-29 DIAGNOSIS — M6281 Muscle weakness (generalized): Secondary | ICD-10-CM | POA: Diagnosis not present

## 2016-09-29 DIAGNOSIS — G4700 Insomnia, unspecified: Secondary | ICD-10-CM | POA: Diagnosis not present

## 2016-09-29 DIAGNOSIS — F411 Generalized anxiety disorder: Secondary | ICD-10-CM | POA: Diagnosis not present

## 2016-09-29 DIAGNOSIS — H9190 Unspecified hearing loss, unspecified ear: Secondary | ICD-10-CM | POA: Diagnosis not present

## 2016-09-29 DIAGNOSIS — R29898 Other symptoms and signs involving the musculoskeletal system: Secondary | ICD-10-CM | POA: Diagnosis not present

## 2016-09-29 DIAGNOSIS — E876 Hypokalemia: Secondary | ICD-10-CM | POA: Diagnosis not present

## 2016-10-04 DIAGNOSIS — F411 Generalized anxiety disorder: Secondary | ICD-10-CM | POA: Diagnosis not present

## 2016-10-04 DIAGNOSIS — G4700 Insomnia, unspecified: Secondary | ICD-10-CM | POA: Diagnosis not present

## 2016-10-04 DIAGNOSIS — R29898 Other symptoms and signs involving the musculoskeletal system: Secondary | ICD-10-CM | POA: Diagnosis not present

## 2016-10-04 DIAGNOSIS — H9190 Unspecified hearing loss, unspecified ear: Secondary | ICD-10-CM | POA: Diagnosis not present

## 2016-10-04 DIAGNOSIS — E876 Hypokalemia: Secondary | ICD-10-CM | POA: Diagnosis not present

## 2016-10-04 DIAGNOSIS — M6281 Muscle weakness (generalized): Secondary | ICD-10-CM | POA: Diagnosis not present

## 2016-10-09 ENCOUNTER — Encounter: Payer: Self-pay | Admitting: Family

## 2016-10-09 ENCOUNTER — Non-Acute Institutional Stay (SKILLED_NURSING_FACILITY): Payer: Medicare Other | Admitting: Family

## 2016-10-09 DIAGNOSIS — R451 Restlessness and agitation: Secondary | ICD-10-CM | POA: Diagnosis not present

## 2016-10-09 DIAGNOSIS — H9190 Unspecified hearing loss, unspecified ear: Secondary | ICD-10-CM | POA: Diagnosis not present

## 2016-10-09 DIAGNOSIS — E876 Hypokalemia: Secondary | ICD-10-CM | POA: Diagnosis not present

## 2016-10-09 DIAGNOSIS — F411 Generalized anxiety disorder: Secondary | ICD-10-CM

## 2016-10-09 DIAGNOSIS — G4700 Insomnia, unspecified: Secondary | ICD-10-CM | POA: Diagnosis not present

## 2016-10-09 DIAGNOSIS — R29898 Other symptoms and signs involving the musculoskeletal system: Secondary | ICD-10-CM | POA: Diagnosis not present

## 2016-10-09 DIAGNOSIS — M6281 Muscle weakness (generalized): Secondary | ICD-10-CM | POA: Diagnosis not present

## 2016-10-09 NOTE — Progress Notes (Signed)
Location:  [facility] Room Number: 28 Place of Service:  SNF (31) Provider: Grethel Zenk FNP-C  Blanchie Serve, MD  Patient Care Team: Blanchie Serve, MD as PCP - General (Internal Medicine) Melina Modena, [facility] Gaynelle Arabian, MD as Consulting Physician (Orthopedic Surgery) Arkeem Harts, Nelda Bucks, NP as Nurse Practitioner (Family Medicine)  Extended Emergency Contact Information Primary Emergency Contact: [place] Mobile Phone: [phone] Relation: Daughter Secondary Emergency Contact: Kerby Less [place] of [place] Phone: [phone] Relation: Daughter  Code Status:  DNR Goals of care: Advanced Directive information Advanced Directives 10/09/2016  Does Patient Have a Medical Advance Directive? Yes  Type of Advance Directive Out of facility DNR (pink MOST or yellow form);Living will;Healthcare Power of Attorney  Does patient want to make changes to medical advance directive? -  Copy of [place] in Chart? Yes  Pre-existing out of facility DNR order (yellow form or pink MOST form) Yellow form placed in chart (order not valid for inpatient use)     Chief Complaint  Patient presents with  . Acute Visit    behavior changes    HPI:  Pt is a 83 y.o. female seen today at [facility] for an acute visit for evaluation of agitation and anxiety.She is seen in her room today per facility Nurse request. Nurse request patient's ativan tablet to be changed to ativan gel due to increased agitation and anxiety patient refuses to take medication. Morning shift Nurse states Patient has no agitation during the day.Usually eats well in the morning then likes to go back to bed until 1130 am. Patient more agitated during the evenings. No fever, chills,cough or signs of urinary tract infection reported.Patient sitting quietly on her wheelchair unable to provider HPI and ROS due to her cognitive impairment and Hard  of hearing.      Past Medical History:  Diagnosis Date  . Abnormality of gait 03/15/2010  . Anxiety state, unspecified 03/15/2010  . Depression, major 06/15/2015  . Depressive disorder, not elsewhere classified 03/15/2010  . Dysphagia, unspecified(787.20) 03/15/2010  . Generalized anxiety disorder 06/23/2014  . Herpes zoster without mention of complication 6/57/8469  . Other alteration of consciousness 03/15/2010  . Pain in hand 08/13/12  . Personal history of fall 03/15/2010  . Senile osteoporosis 03/15/2010  . Unspecified constipation 03/15/2010  . Unspecified essential hypertension 03/15/2010  . Unspecified hearing loss 08/22/2011  . Unspecified urinary incontinence 03/15/2010  . Urinary incontinence 03/15/2010  . Vaginal prolapse 06/15/2015   Past Surgical History:  Procedure Laterality Date  . INCISION / DRAINAGE HAND / FINGER Right 06/2008   hand  . SPINE SURGERY  12/2006    Allergies  Allergen Reactions  . Ace Inhibitors Other (See Comments)    Reaction unknown/ listed on MAR  . Hydrochlorothiazide Other (See Comments)    Reaction unknown/ listed on MAR  . Morphine And Related Other (See Comments)    Hallucinations    Outpatient Encounter Prescriptions as of 10/09/2016  Medication Sig  . acetaminophen (TYLENOL) 325 MG tablet Take 2 tablets (650 mg total) by mouth every 6 (six) hours as needed for mild pain (or Fever >/= 101).  Marland Kitchen acetaminophen (TYLENOL) 500 MG tablet Take 1,000 mg by mouth at bedtime.  Marland Kitchen aspirin EC 81 MG tablet Take 1 tablet (81 mg total) by mouth daily.  . Calcium Carbonate-Vitamin D (CALCIUM 600+D) 600-200 MG-UNIT TABS Take 1 tablet by mouth daily.   . citalopram (CELEXA) 20  MG tablet Take 20 mg by mouth daily.  . cyanocobalamin 1000 MCG tablet Take 1,000 mcg by mouth daily.   . hydrocortisone (PROCTO-MED HC) 2.5 % rectal cream Place 1 application rectally every 12 (twelve) hours as needed for hemorrhoids or itching.  Marland Kitchen ibuprofen (ADVIL,MOTRIN) 200 MG tablet  Take 400 mg by mouth every 6 (six) hours as needed (for pain).  Marland Kitchen levothyroxine (SYNTHROID, LEVOTHROID) 25 MCG tablet Take 25 mcg by mouth daily before breakfast.  . LORazepam (ATIVAN) 0.5 MG tablet Take 0.5 tablets (0.25 mg total) by mouth every 8 (eight) hours as needed for anxiety. Take one tablet every 8 hours as needed for anxiety  . metoprolol tartrate (LOPRESSOR) 25 MG tablet Take 25 mg by mouth 2 (two) times daily. 25mg  in the AM and 12.5 in the PM. Hold if SBP < 110  . mineral oil-hydrophilic petrolatum (AQUAPHOR) ointment Apply 1 application topically daily as needed for dry skin.  . mirtazapine (REMERON) 15 MG tablet Take 15 mg by mouth at bedtime.  . Nutritional Supplements (RESOURCE 2.0) LIQD Take 120 mLs by mouth 2 (two) times daily between meals. At 10AM and 4PM  . polyethylene glycol (MIRALAX / GLYCOLAX) packet Take 17 g by mouth daily.  . risperiDONE (RISPERDAL) 0.5 MG tablet Take 0.25 mg by mouth daily. At [facility]   No facility-administered encounter medications on file as of 10/09/2016.     Review of Systems  Unable to perform ROS: Dementia    Immunization History  Administered Date(s) Administered  . Influenza Whole 10/24/2011, 10/24/2012  . Influenza-Unspecified 11/06/2013, 11/04/2015  . PPD Test 07/27/2009, 12/28/2014, 01/11/2015  . Pneumococcal Polysaccharide-23 01/23/2009  . Pneumococcal-Unspecified 01/23/2005  . Td 07/14/2009   Pertinent  Health Maintenance Due  Topic Date Due  . PNA vac Low Risk Adult (2 of 2 - PCV13) 01/23/2010  . INFLUENZA VACCINE  08/23/2016  . DEXA SCAN  01/24/2023 (Originally 07[birth year]26/1984)   Fall Risk  08/28/2016 03/20/2016 09/21/2015 06/15/2015 01/05/2015  Falls in the past year? Yes Yes No No No  Number falls in past yr: 2 or more - - - -  Injury with Fall? Yes Yes - - -  Comment - fracture right knee - - -  Risk Factor Category  - High Fall Risk - - -    Vitals:   10/09/16 1112  BP: 135/84  Pulse: (!) 53  Resp: 20  Temp: (!) 97.1 F  (36.2 C)  SpO2: 96%  Weight: 116 lb 3.2 oz (52.7 kg)  Height: 5\' 2"  (1.575 m)   Body mass index is 21.25 kg/m. Physical Exam  Constitutional: She appears well-developed and well-nourished.  Elderly in no acute distress   HENT:  Head: Normocephalic.  Mouth/Throat: Oropharynx is clear and moist. No oropharyngeal exudate.  Left Hearing Aid in place  Eyes: Pupils are equal, round, and reactive to light. Conjunctivae are normal. Right eye exhibits no discharge. Left eye exhibits no discharge. No scleral icterus.  Neck: Normal range of motion. No JVD present. No thyromegaly present.  Cardiovascular: Normal rate, regular rhythm, normal heart sounds and intact distal pulses.  Exam reveals no gallop and no friction rub.   No murmur heard. Pulmonary/Chest: Effort normal and breath sounds normal. No respiratory distress. She has no wheezes. She has no rales.  Abdominal: Soft. Bowel sounds are normal. She exhibits no distension. There is no tenderness. There is no rebound and no guarding.  Lymphadenopathy:    She has no cervical adenopathy.  Neurological: She is  alert.  Confused at her baseline   Skin: Skin is warm and dry. No rash noted. No erythema. No pallor.  Psychiatric: She has a normal mood and affect.    Labs reviewed:  Recent Labs  01/21/16 1815 01/22/16 0532  03/17/16 0907 03/18/16 0847 07/03/16  NA  --  137  < > 139 137 141  K  --  4.2  < > 3.3* 4.0 4.3  CL  --  103  --  102 102  --   CO2  --  27  --  29 26  --   GLUCOSE  --  85  --  97 110*  --   BUN  --  25*  < > 17 24* 22*  CREATININE  --  0.96  < > 0.62 1.06* 0.7  CALCIUM  --  8.6*  --  9.1 9.0  --   MG 1.9  --   --   --   --   --   < > = values in this interval not displayed.  Recent Labs  12/13/15 01/21/16 1412 03/17/16 0907  AST 18 19 18   ALT 14 16 13*  ALKPHOS 71 62 59  BILITOT  --  0.8 1.1  PROT  --  6.4* 6.9  ALBUMIN  --  3.4* 4.2    Recent Labs  11/10/15 1452  01/21/16 1412 01/22/16 0532   03/17/16 0907 03/18/16 0431 07/03/16  WBC 8.1  < > 10.4 7.5  < > 10.0 11.5* 7.0  NEUTROABS 5.8  --  8.0*  --   --  6.8  --   --   HGB 13.1  < > 12.0 11.9*  < > 13.8 13.1 12.3  HCT 38.5  < > 35.5* 35.3*  < > 40.5 38.5 36  MCV 98.2  --  98.3 97.8  --  96.4 96.3  --   PLT 343  < > 363 327  < > 337 355 328  < > = values in this interval not displayed. Lab Results  Component Value Date   TSH 2.38 07/03/2016   Lab Results  Component Value Date   HGBA1C 5.5 07/06/2016   Lab Results  Component Value Date   CHOL 168 08/03/2016   HDL 56 08/03/2016   LDLCALC 93 08/03/2016   TRIG 96 08/03/2016   CHOLHDL 3.0 12/11/2014    Significant Diagnostic Results in last 30 days:  No results found.  Assessment/Plan 1. Generalized anxiety disorder Refuses medication at times.Nurse request ativan to be changed to gel. Will discontinue current Ativan 0.5 mg tablet then start Ativan gel every 8 hours as needed for anxiety.Continue to monitor.   2. Agitation Refuses medication at times.Agitation worst in the evenings.change Ativan to gel as above.   Family/ staff Communication: Reviewed plan of care with patient and facility Nurse supervisor  Labs/tests ordered: None   Sandrea Hughs, NP

## 2016-10-10 DIAGNOSIS — F411 Generalized anxiety disorder: Secondary | ICD-10-CM | POA: Diagnosis not present

## 2016-10-10 DIAGNOSIS — G4700 Insomnia, unspecified: Secondary | ICD-10-CM | POA: Diagnosis not present

## 2016-10-10 DIAGNOSIS — R29898 Other symptoms and signs involving the musculoskeletal system: Secondary | ICD-10-CM | POA: Diagnosis not present

## 2016-10-10 DIAGNOSIS — M6281 Muscle weakness (generalized): Secondary | ICD-10-CM | POA: Diagnosis not present

## 2016-10-10 DIAGNOSIS — H9190 Unspecified hearing loss, unspecified ear: Secondary | ICD-10-CM | POA: Diagnosis not present

## 2016-10-10 DIAGNOSIS — E876 Hypokalemia: Secondary | ICD-10-CM | POA: Diagnosis not present

## 2016-10-11 DIAGNOSIS — F411 Generalized anxiety disorder: Secondary | ICD-10-CM | POA: Diagnosis not present

## 2016-10-11 DIAGNOSIS — G4700 Insomnia, unspecified: Secondary | ICD-10-CM | POA: Diagnosis not present

## 2016-10-11 DIAGNOSIS — H9190 Unspecified hearing loss, unspecified ear: Secondary | ICD-10-CM | POA: Diagnosis not present

## 2016-10-11 DIAGNOSIS — M6281 Muscle weakness (generalized): Secondary | ICD-10-CM | POA: Diagnosis not present

## 2016-10-11 DIAGNOSIS — R29898 Other symptoms and signs involving the musculoskeletal system: Secondary | ICD-10-CM | POA: Diagnosis not present

## 2016-10-11 DIAGNOSIS — E876 Hypokalemia: Secondary | ICD-10-CM | POA: Diagnosis not present

## 2016-10-13 DIAGNOSIS — M6281 Muscle weakness (generalized): Secondary | ICD-10-CM | POA: Diagnosis not present

## 2016-10-13 DIAGNOSIS — E876 Hypokalemia: Secondary | ICD-10-CM | POA: Diagnosis not present

## 2016-10-13 DIAGNOSIS — G4700 Insomnia, unspecified: Secondary | ICD-10-CM | POA: Diagnosis not present

## 2016-10-13 DIAGNOSIS — H9190 Unspecified hearing loss, unspecified ear: Secondary | ICD-10-CM | POA: Diagnosis not present

## 2016-10-13 DIAGNOSIS — R29898 Other symptoms and signs involving the musculoskeletal system: Secondary | ICD-10-CM | POA: Diagnosis not present

## 2016-10-13 DIAGNOSIS — F411 Generalized anxiety disorder: Secondary | ICD-10-CM | POA: Diagnosis not present

## 2016-10-16 DIAGNOSIS — E876 Hypokalemia: Secondary | ICD-10-CM | POA: Diagnosis not present

## 2016-10-16 DIAGNOSIS — H9190 Unspecified hearing loss, unspecified ear: Secondary | ICD-10-CM | POA: Diagnosis not present

## 2016-10-16 DIAGNOSIS — R29898 Other symptoms and signs involving the musculoskeletal system: Secondary | ICD-10-CM | POA: Diagnosis not present

## 2016-10-16 DIAGNOSIS — G4700 Insomnia, unspecified: Secondary | ICD-10-CM | POA: Diagnosis not present

## 2016-10-16 DIAGNOSIS — F411 Generalized anxiety disorder: Secondary | ICD-10-CM | POA: Diagnosis not present

## 2016-10-16 DIAGNOSIS — M6281 Muscle weakness (generalized): Secondary | ICD-10-CM | POA: Diagnosis not present

## 2016-10-17 ENCOUNTER — Non-Acute Institutional Stay (SKILLED_NURSING_FACILITY): Payer: Medicare Other | Admitting: Family

## 2016-10-17 ENCOUNTER — Encounter: Payer: Self-pay | Admitting: Family

## 2016-10-17 DIAGNOSIS — F418 Other specified anxiety disorders: Secondary | ICD-10-CM | POA: Diagnosis not present

## 2016-10-17 DIAGNOSIS — I10 Essential (primary) hypertension: Secondary | ICD-10-CM

## 2016-10-17 DIAGNOSIS — E039 Hypothyroidism, unspecified: Secondary | ICD-10-CM

## 2016-10-17 NOTE — Progress Notes (Signed)
Location:  [facility] Room Number: 34 Place of Service:  SNF (31) Provider: Rossie Scarfone FNP-C   Blanchie Serve, MD  Patient Care Team: Blanchie Serve, MD as PCP - General (Internal Medicine) Melina Modena, [facility] Gaynelle Arabian, MD as Consulting Physician (Orthopedic Surgery) Eldrick Penick, Nelda Bucks, NP as Nurse Practitioner (Family Medicine)  Extended Emergency Contact Information Primary Emergency Contact: [place] of [place] Mobile Phone: [phone] Relation: Daughter Secondary Emergency Contact: Kerby Less [place] of [place] Phone: [phone] Relation: Daughter  Code Status: DNR  Goals of care: Advanced Directive information Advanced Directives 10/17/2016  Does Patient Have a Medical Advance Directive? Yes  Type of Advance Directive Out of facility DNR (pink MOST or yellow form);Living will;Healthcare Power of Attorney  Does patient want to make changes to medical advance directive? -  Copy of [place] in Chart? Yes  Pre-existing out of facility DNR order (yellow form or pink MOST form) Yellow form placed in chart (order not valid for inpatient use)     Chief Complaint  Patient presents with  . Medical Management of Chronic Issues    routine visit    HPI:  Pt is a 83 y.o. female seen today [facility] for medical management of chronic diseases. She has a medical history of HTN, TIA,Hypothyroidism,Alzheimer's dementia, depression, anxiety among other conditions. She is seen in her room today. Facility Nurse reports no recent fall episodes or acute illness. She continues to require total care assistance with her ADL's. She is unable to provide HPI and ROS information due to her cognitive impairment.    Past Medical History:  Diagnosis Date  . Abnormality of gait 03/15/2010  . Anxiety state, unspecified 03/15/2010  . Depression, major 06/15/2015  . Depressive disorder, not elsewhere classified  03/15/2010  . Dysphagia, unspecified(787.20) 03/15/2010  . Generalized anxiety disorder 06/23/2014  . Herpes zoster without mention of complication 06/29/3708  . Other alteration of consciousness 03/15/2010  . Pain in hand 08/13/12  . Personal history of fall 03/15/2010  . Senile osteoporosis 03/15/2010  . Unspecified constipation 03/15/2010  . Unspecified essential hypertension 03/15/2010  . Unspecified hearing loss 08/22/2011  . Unspecified urinary incontinence 03/15/2010  . Urinary incontinence 03/15/2010  . Vaginal prolapse 06/15/2015   Past Surgical History:  Procedure Laterality Date  . INCISION / DRAINAGE HAND / FINGER Right 06/2008   hand  . SPINE SURGERY  12/2006    Allergies  Allergen Reactions  . Ace Inhibitors Other (See Comments)    Reaction unknown/ listed on MAR  . Hydrochlorothiazide Other (See Comments)    Reaction unknown/ listed on MAR  . Morphine And Related Other (See Comments)    Hallucinations    Allergies as of 10/17/2016      Reactions   Ace Inhibitors Other (See Comments)   Reaction unknown/ listed on MAR   Hydrochlorothiazide Other (See Comments)   Reaction unknown/ listed on MAR   Morphine And Related Other (See Comments)   Hallucinations      Medication List       Accurate as of 10/17/16 12:43 PM. Always use your most recent med list.          acetaminophen 500 MG tablet Commonly known as:  TYLENOL Take 1,000 mg by mouth at bedtime.   acetaminophen 325 MG tablet Commonly known as:  TYLENOL Take 2 tablets (650 mg total) by mouth every 6 (six) hours as needed for mild pain (or Fever >/= 101).  aspirin EC 81 MG tablet Take 1 tablet (81 mg total) by mouth daily.   CALCIUM 600+D 600-200 MG-UNIT Tabs Generic drug:  Calcium Carbonate-Vitamin D Take 1 tablet by mouth daily.   citalopram 20 MG tablet Commonly known as:  CELEXA Take 20 mg by mouth daily.   cyanocobalamin 1000 MCG tablet Take 1,000 mcg by mouth daily.   ibuprofen 200 MG  tablet Commonly known as:  ADVIL,MOTRIN Take 400 mg by mouth every 6 (six) hours as needed (for pain).   levothyroxine 25 MCG tablet Commonly known as:  SYNTHROID, LEVOTHROID Take 25 mcg by mouth daily before breakfast.   LORazepam 0.5 MG tablet Commonly known as:  ATIVAN Take 0.5 tablets (0.25 mg total) by mouth every 8 (eight) hours as needed for anxiety. Take one tablet every 8 hours as needed for anxiety   metoprolol tartrate 25 MG tablet Commonly known as:  LOPRESSOR Take 25 mg by mouth 2 (two) times daily. 25mg  in the AM and 12.5 in the PM. Hold if SBP < 110   mineral oil-hydrophilic petrolatum ointment Apply 1 application topically daily as needed for dry skin.   mirtazapine 15 MG tablet Commonly known as:  REMERON Take 15 mg by mouth at bedtime.   polyethylene glycol packet Commonly known as:  MIRALAX / GLYCOLAX Take 17 g by mouth daily.   PROCTO-MED HC 2.5 % rectal cream Generic drug:  hydrocortisone Place 1 application rectally every 12 (twelve) hours as needed for hemorrhoids or itching.   RESOURCE 2.0 Liqd Take 120 mLs by mouth 2 (two) times daily between meals. At 10AM and 4PM   risperiDONE 0.5 MG tablet Commonly known as:  RISPERDAL Take 0.25 mg by mouth daily. At [facility]       Review of Systems  Unable to perform ROS: Dementia    Immunization History  Administered Date(s) Administered  . Influenza Whole 10/24/2011, 10/24/2012  . Influenza-Unspecified 11/06/2013, 11/04/2015  . PPD Test 07/27/2009, 12/28/2014, 01/11/2015  . Pneumococcal Polysaccharide-23 01/23/2009  . Pneumococcal-Unspecified 01/23/2005  . Td 07/14/2009   Pertinent  Health Maintenance Due  Topic Date Due  . PNA vac Low Risk Adult (2 of 2 - PCV13) 01/23/2010  . INFLUENZA VACCINE  08/23/2016  . DEXA SCAN  01/24/2023 (Originally 07[birth year]26/1984)   Fall Risk  08/28/2016 03/20/2016 09/21/2015 06/15/2015 01/05/2015  Falls in the past year? Yes Yes No No No  Number falls in past yr: 2 or more - - -  -  Injury with Fall? Yes Yes - - -  Comment - fracture right knee - - -  Risk Factor Category  - High Fall Risk - - -    Vitals:   10/17/16 1031  BP: (!) 155/73  Pulse: (!) 57  Resp: 18  Temp: 97.8 F (36.6 C)  SpO2: 93%  Weight: 114 lb 11.2 oz (52 kg)  Height: 5\' 2"  (1.575 m)   Body mass index is 20.98 kg/m. Physical Exam  Constitutional:  Thin frail elderly lying in the bed non-verbal during this visit.   HENT:  Head: Normocephalic.  Right Ear: External ear normal.  Left Ear: External ear normal.  Mouth/Throat: Oropharynx is clear and moist. No oropharyngeal exudate.  Eyes: Pupils are equal, round, and reactive to light. Conjunctivae are normal. Right eye exhibits no discharge. Left eye exhibits no discharge. No scleral icterus.  Neck: Normal range of motion. No JVD present. No thyromegaly present.  Cardiovascular: Normal rate, regular rhythm, normal heart sounds and intact distal pulses.  Exam reveals no gallop and no friction rub.   No murmur heard. Pulmonary/Chest: Effort normal and breath sounds normal. No respiratory distress. She has no wheezes. She has no rales.  Abdominal: Soft. Bowel sounds are normal. She exhibits no distension and no mass. There is no tenderness. There is no rebound and no guarding.  Musculoskeletal: She exhibits no edema or tenderness.  Moves x 4 extremities without difficulties except right middle and ring fingers contracture noted.wheelchair bound.   Lymphadenopathy:    She has no cervical adenopathy.  Neurological: She is alert. Coordination normal.  Non-verbal during visit but opens eyes to verbal command.    Skin: Skin is warm and dry. No rash noted. No erythema. No pallor.  Skin intact   Psychiatric: She has a normal mood and affect.   Labs reviewed:  Recent Labs  01/21/16 1815 01/22/16 0532  03/17/16 0907 03/18/16 0847 07/03/16  NA  --  137  < > 139 137 141  K  --  4.2  < > 3.3* 4.0 4.3  CL  --  103  --  102 102  --   CO2  --   27  --  29 26  --   GLUCOSE  --  85  --  97 110*  --   BUN  --  25*  < > 17 24* 22*  CREATININE  --  0.96  < > 0.62 1.06* 0.7  CALCIUM  --  8.6*  --  9.1 9.0  --   MG 1.9  --   --   --   --   --   < > = values in this interval not displayed.  Recent Labs  12/13/15 01/21/16 1412 03/17/16 0907  AST 18 19 18   ALT 14 16 13*  ALKPHOS 71 62 59  BILITOT  --  0.8 1.1  PROT  --  6.4* 6.9  ALBUMIN  --  3.4* 4.2    Recent Labs  11/10/15 1452  01/21/16 1412 01/22/16 0532  03/17/16 0907 03/18/16 0431 07/03/16  WBC 8.1  < > 10.4 7.5  < > 10.0 11.5* 7.0  NEUTROABS 5.8  --  8.0*  --   --  6.8  --   --   HGB 13.1  < > 12.0 11.9*  < > 13.8 13.1 12.3  HCT 38.5  < > 35.5* 35.3*  < > 40.5 38.5 36  MCV 98.2  --  98.3 97.8  --  96.4 96.3  --   PLT 343  < > 363 327  < > 337 355 328  < > = values in this interval not displayed. Lab Results  Component Value Date   TSH 2.38 07/03/2016   Lab Results  Component Value Date   HGBA1C 5.5 07/06/2016   Lab Results  Component Value Date   CHOL 168 08/03/2016   HDL 56 08/03/2016   LDLCALC 93 08/03/2016   TRIG 96 08/03/2016   CHOLHDL 3.0 12/11/2014    Significant Diagnostic Results in last 30 days:  No results found.  Assessment/Plan 1. Essential hypertension B/p stable. Continue on metoprolol 25 mg tablet twice daily. On ASA daily. Continue to monitor BMP.   2. Hypothyroidism Lab Results  Component Value Date   TSH 2.38 07/03/2016  Continue on levothyroxine 25 mcg Tablet.Recheck TSH level in 4 weeks.    3. Depression with anxiety Agitation at times in the evenings but calm during the day. Continue on citalopram 20 mg tablet, risperidone 0.25  mg Tablet and ativan 0.25 mg Tablet as needed.continue to monitor for mood changes.    Family/ staff Communication: Reviewed plan of care with patient and facility Nurse.   Labs/tests ordered: TSH level in 4 weeks.  Sandrea Hughs, NP

## 2016-10-18 DIAGNOSIS — H9190 Unspecified hearing loss, unspecified ear: Secondary | ICD-10-CM | POA: Diagnosis not present

## 2016-10-18 DIAGNOSIS — E876 Hypokalemia: Secondary | ICD-10-CM | POA: Diagnosis not present

## 2016-10-18 DIAGNOSIS — R29898 Other symptoms and signs involving the musculoskeletal system: Secondary | ICD-10-CM | POA: Diagnosis not present

## 2016-10-18 DIAGNOSIS — G4700 Insomnia, unspecified: Secondary | ICD-10-CM | POA: Diagnosis not present

## 2016-10-18 DIAGNOSIS — F411 Generalized anxiety disorder: Secondary | ICD-10-CM | POA: Diagnosis not present

## 2016-10-18 DIAGNOSIS — M6281 Muscle weakness (generalized): Secondary | ICD-10-CM | POA: Diagnosis not present

## 2016-10-20 DIAGNOSIS — E876 Hypokalemia: Secondary | ICD-10-CM | POA: Diagnosis not present

## 2016-10-20 DIAGNOSIS — F411 Generalized anxiety disorder: Secondary | ICD-10-CM | POA: Diagnosis not present

## 2016-10-20 DIAGNOSIS — M6281 Muscle weakness (generalized): Secondary | ICD-10-CM | POA: Diagnosis not present

## 2016-10-20 DIAGNOSIS — R29898 Other symptoms and signs involving the musculoskeletal system: Secondary | ICD-10-CM | POA: Diagnosis not present

## 2016-10-20 DIAGNOSIS — H9190 Unspecified hearing loss, unspecified ear: Secondary | ICD-10-CM | POA: Diagnosis not present

## 2016-10-20 DIAGNOSIS — G4700 Insomnia, unspecified: Secondary | ICD-10-CM | POA: Diagnosis not present

## 2016-10-23 DIAGNOSIS — R269 Unspecified abnormalities of gait and mobility: Secondary | ICD-10-CM | POA: Diagnosis not present

## 2016-10-23 DIAGNOSIS — R32 Unspecified urinary incontinence: Secondary | ICD-10-CM | POA: Diagnosis not present

## 2016-10-23 DIAGNOSIS — M79643 Pain in unspecified hand: Secondary | ICD-10-CM | POA: Diagnosis not present

## 2016-10-23 DIAGNOSIS — G4700 Insomnia, unspecified: Secondary | ICD-10-CM | POA: Diagnosis not present

## 2016-10-23 DIAGNOSIS — I10 Essential (primary) hypertension: Secondary | ICD-10-CM | POA: Diagnosis not present

## 2016-10-23 DIAGNOSIS — M6281 Muscle weakness (generalized): Secondary | ICD-10-CM | POA: Diagnosis not present

## 2016-10-23 DIAGNOSIS — H9190 Unspecified hearing loss, unspecified ear: Secondary | ICD-10-CM | POA: Diagnosis not present

## 2016-10-23 DIAGNOSIS — F329 Major depressive disorder, single episode, unspecified: Secondary | ICD-10-CM | POA: Diagnosis not present

## 2016-10-23 DIAGNOSIS — R29898 Other symptoms and signs involving the musculoskeletal system: Secondary | ICD-10-CM | POA: Diagnosis not present

## 2016-10-23 DIAGNOSIS — M6530 Trigger finger, unspecified finger: Secondary | ICD-10-CM | POA: Diagnosis not present

## 2016-10-23 DIAGNOSIS — F411 Generalized anxiety disorder: Secondary | ICD-10-CM | POA: Diagnosis not present

## 2016-10-23 DIAGNOSIS — E876 Hypokalemia: Secondary | ICD-10-CM | POA: Diagnosis not present

## 2016-10-25 DIAGNOSIS — H9190 Unspecified hearing loss, unspecified ear: Secondary | ICD-10-CM | POA: Diagnosis not present

## 2016-10-25 DIAGNOSIS — F411 Generalized anxiety disorder: Secondary | ICD-10-CM | POA: Diagnosis not present

## 2016-10-25 DIAGNOSIS — M6281 Muscle weakness (generalized): Secondary | ICD-10-CM | POA: Diagnosis not present

## 2016-10-25 DIAGNOSIS — E876 Hypokalemia: Secondary | ICD-10-CM | POA: Diagnosis not present

## 2016-10-25 DIAGNOSIS — G4700 Insomnia, unspecified: Secondary | ICD-10-CM | POA: Diagnosis not present

## 2016-10-25 DIAGNOSIS — R29898 Other symptoms and signs involving the musculoskeletal system: Secondary | ICD-10-CM | POA: Diagnosis not present

## 2016-10-26 DIAGNOSIS — R29898 Other symptoms and signs involving the musculoskeletal system: Secondary | ICD-10-CM | POA: Diagnosis not present

## 2016-10-26 DIAGNOSIS — M6281 Muscle weakness (generalized): Secondary | ICD-10-CM | POA: Diagnosis not present

## 2016-10-26 DIAGNOSIS — E876 Hypokalemia: Secondary | ICD-10-CM | POA: Diagnosis not present

## 2016-10-26 DIAGNOSIS — G4700 Insomnia, unspecified: Secondary | ICD-10-CM | POA: Diagnosis not present

## 2016-10-26 DIAGNOSIS — F411 Generalized anxiety disorder: Secondary | ICD-10-CM | POA: Diagnosis not present

## 2016-10-26 DIAGNOSIS — H9190 Unspecified hearing loss, unspecified ear: Secondary | ICD-10-CM | POA: Diagnosis not present

## 2016-10-30 DIAGNOSIS — G4700 Insomnia, unspecified: Secondary | ICD-10-CM | POA: Diagnosis not present

## 2016-10-30 DIAGNOSIS — E876 Hypokalemia: Secondary | ICD-10-CM | POA: Diagnosis not present

## 2016-10-30 DIAGNOSIS — F411 Generalized anxiety disorder: Secondary | ICD-10-CM | POA: Diagnosis not present

## 2016-10-30 DIAGNOSIS — H9190 Unspecified hearing loss, unspecified ear: Secondary | ICD-10-CM | POA: Diagnosis not present

## 2016-10-30 DIAGNOSIS — M6281 Muscle weakness (generalized): Secondary | ICD-10-CM | POA: Diagnosis not present

## 2016-10-30 DIAGNOSIS — R29898 Other symptoms and signs involving the musculoskeletal system: Secondary | ICD-10-CM | POA: Diagnosis not present

## 2016-11-01 DIAGNOSIS — E876 Hypokalemia: Secondary | ICD-10-CM | POA: Diagnosis not present

## 2016-11-01 DIAGNOSIS — R29898 Other symptoms and signs involving the musculoskeletal system: Secondary | ICD-10-CM | POA: Diagnosis not present

## 2016-11-01 DIAGNOSIS — F411 Generalized anxiety disorder: Secondary | ICD-10-CM | POA: Diagnosis not present

## 2016-11-01 DIAGNOSIS — G4700 Insomnia, unspecified: Secondary | ICD-10-CM | POA: Diagnosis not present

## 2016-11-01 DIAGNOSIS — H9190 Unspecified hearing loss, unspecified ear: Secondary | ICD-10-CM | POA: Diagnosis not present

## 2016-11-01 DIAGNOSIS — M6281 Muscle weakness (generalized): Secondary | ICD-10-CM | POA: Diagnosis not present

## 2016-11-02 DIAGNOSIS — F411 Generalized anxiety disorder: Secondary | ICD-10-CM | POA: Diagnosis not present

## 2016-11-02 DIAGNOSIS — R29898 Other symptoms and signs involving the musculoskeletal system: Secondary | ICD-10-CM | POA: Diagnosis not present

## 2016-11-02 DIAGNOSIS — M6281 Muscle weakness (generalized): Secondary | ICD-10-CM | POA: Diagnosis not present

## 2016-11-02 DIAGNOSIS — G4700 Insomnia, unspecified: Secondary | ICD-10-CM | POA: Diagnosis not present

## 2016-11-02 DIAGNOSIS — E876 Hypokalemia: Secondary | ICD-10-CM | POA: Diagnosis not present

## 2016-11-02 DIAGNOSIS — H9190 Unspecified hearing loss, unspecified ear: Secondary | ICD-10-CM | POA: Diagnosis not present

## 2016-11-06 DIAGNOSIS — G4700 Insomnia, unspecified: Secondary | ICD-10-CM | POA: Diagnosis not present

## 2016-11-06 DIAGNOSIS — H9190 Unspecified hearing loss, unspecified ear: Secondary | ICD-10-CM | POA: Diagnosis not present

## 2016-11-06 DIAGNOSIS — E876 Hypokalemia: Secondary | ICD-10-CM | POA: Diagnosis not present

## 2016-11-06 DIAGNOSIS — M6281 Muscle weakness (generalized): Secondary | ICD-10-CM | POA: Diagnosis not present

## 2016-11-06 DIAGNOSIS — R29898 Other symptoms and signs involving the musculoskeletal system: Secondary | ICD-10-CM | POA: Diagnosis not present

## 2016-11-06 DIAGNOSIS — F411 Generalized anxiety disorder: Secondary | ICD-10-CM | POA: Diagnosis not present

## 2016-11-07 DIAGNOSIS — R29898 Other symptoms and signs involving the musculoskeletal system: Secondary | ICD-10-CM | POA: Diagnosis not present

## 2016-11-07 DIAGNOSIS — E876 Hypokalemia: Secondary | ICD-10-CM | POA: Diagnosis not present

## 2016-11-07 DIAGNOSIS — H9190 Unspecified hearing loss, unspecified ear: Secondary | ICD-10-CM | POA: Diagnosis not present

## 2016-11-07 DIAGNOSIS — G4700 Insomnia, unspecified: Secondary | ICD-10-CM | POA: Diagnosis not present

## 2016-11-07 DIAGNOSIS — M6281 Muscle weakness (generalized): Secondary | ICD-10-CM | POA: Diagnosis not present

## 2016-11-07 DIAGNOSIS — F411 Generalized anxiety disorder: Secondary | ICD-10-CM | POA: Diagnosis not present

## 2016-11-08 DIAGNOSIS — M6281 Muscle weakness (generalized): Secondary | ICD-10-CM | POA: Diagnosis not present

## 2016-11-08 DIAGNOSIS — E876 Hypokalemia: Secondary | ICD-10-CM | POA: Diagnosis not present

## 2016-11-08 DIAGNOSIS — R29898 Other symptoms and signs involving the musculoskeletal system: Secondary | ICD-10-CM | POA: Diagnosis not present

## 2016-11-08 DIAGNOSIS — G4700 Insomnia, unspecified: Secondary | ICD-10-CM | POA: Diagnosis not present

## 2016-11-08 DIAGNOSIS — H9190 Unspecified hearing loss, unspecified ear: Secondary | ICD-10-CM | POA: Diagnosis not present

## 2016-11-08 DIAGNOSIS — F411 Generalized anxiety disorder: Secondary | ICD-10-CM | POA: Diagnosis not present

## 2016-11-09 ENCOUNTER — Non-Acute Institutional Stay (SKILLED_NURSING_FACILITY): Payer: Medicare Other | Admitting: Family

## 2016-11-09 ENCOUNTER — Encounter: Payer: Self-pay | Admitting: Family

## 2016-11-09 DIAGNOSIS — G4700 Insomnia, unspecified: Secondary | ICD-10-CM | POA: Diagnosis not present

## 2016-11-09 DIAGNOSIS — R2681 Unsteadiness on feet: Secondary | ICD-10-CM | POA: Diagnosis not present

## 2016-11-09 DIAGNOSIS — H9190 Unspecified hearing loss, unspecified ear: Secondary | ICD-10-CM | POA: Diagnosis not present

## 2016-11-09 DIAGNOSIS — E876 Hypokalemia: Secondary | ICD-10-CM | POA: Diagnosis not present

## 2016-11-09 DIAGNOSIS — R29898 Other symptoms and signs involving the musculoskeletal system: Secondary | ICD-10-CM | POA: Diagnosis not present

## 2016-11-09 DIAGNOSIS — Y92129 Unspecified place in nursing home as the place of occurrence of the external cause: Secondary | ICD-10-CM | POA: Diagnosis not present

## 2016-11-09 DIAGNOSIS — M6281 Muscle weakness (generalized): Secondary | ICD-10-CM | POA: Diagnosis not present

## 2016-11-09 DIAGNOSIS — W19XXXA Unspecified fall, initial encounter: Secondary | ICD-10-CM | POA: Diagnosis not present

## 2016-11-09 DIAGNOSIS — F411 Generalized anxiety disorder: Secondary | ICD-10-CM | POA: Diagnosis not present

## 2016-11-09 NOTE — Progress Notes (Signed)
Location:  [place] Room Number: 56 Place of Service:  SNF (31) Provider: Fatmata Legere FNP-C  Blanchie Serve, MD  Patient Care Team: Blanchie Serve, MD as PCP - General (Internal Medicine) Melina Modena, [facility] Gaynelle Arabian, MD as Consulting Physician (Orthopedic Surgery) Delani Kohli, Nelda Bucks, NP as Nurse Practitioner (Family Medicine)  Extended Emergency Contact Information Primary Emergency Contact: [place] of [place] Mobile Phone: [phone] Relation: Daughter Secondary Emergency Contact: Kerby Less [place] of [place] Phone: [phone] Relation: Daughter  Code Status:  DNR Goals of care: Advanced Directive information Advanced Directives 11/09/2016  Does Patient Have a Medical Advance Directive? Yes  Type of Advance Directive Out of facility DNR (pink MOST or yellow form);[place];Living will  Does patient want to make changes to medical advance directive? -  Copy of [place] in Chart? Yes  Pre-existing out of facility DNR order (yellow form or pink MOST form) Yellow form placed in chart (order not valid for inpatient use)     Chief Complaint  Patient presents with  . Acute Visit    fall    HPI:  Pt is a 83 y.o. female seen today at [facility] for an acute visit for follow up fall episode. She is seen in her room today per facility Nurse request. Nurse reports patient was found sitting on the floor.patient was sitting on her wheelchair prior to being found on the floor.No acute injuries or bruises  Sustained.she is pleasantly confused unable to provider HPI and ROS. B/p log reviewed B/p readings in the 120's/60's to 150's/70's with exception of B/p after the fall 180's/70's. No changes in Neuro checks. Nurse reports no fever, chills , cough or signs of UTI's.    Past Medical History:  Diagnosis Date  . Abnormality of gait 03/15/2010  . Anxiety state, unspecified  03/15/2010  . Depression, major 06/15/2015  . Depressive disorder, not elsewhere classified 03/15/2010  . Dysphagia, unspecified(787.20) 03/15/2010  . Generalized anxiety disorder 06/23/2014  . Herpes zoster without mention of complication 0/27/7412  . Other alteration of consciousness 03/15/2010  . Pain in hand 08/13/12  . Personal history of fall 03/15/2010  . Senile osteoporosis 03/15/2010  . Unspecified constipation 03/15/2010  . Unspecified essential hypertension 03/15/2010  . Unspecified hearing loss 08/22/2011  . Unspecified urinary incontinence 03/15/2010  . Urinary incontinence 03/15/2010  . Vaginal prolapse 06/15/2015   Past Surgical History:  Procedure Laterality Date  . INCISION / DRAINAGE HAND / FINGER Right 06/2008   hand  . SPINE SURGERY  12/2006    Allergies  Allergen Reactions  . Ace Inhibitors Other (See Comments)    Reaction unknown/ listed on MAR  . Hydrochlorothiazide Other (See Comments)    Reaction unknown/ listed on MAR  . Morphine And Related Other (See Comments)    Hallucinations    Outpatient Encounter Prescriptions as of 11/09/2016  Medication Sig  . acetaminophen (TYLENOL) 325 MG tablet Take 2 tablets (650 mg total) by mouth every 6 (six) hours as needed for mild pain (or Fever >/= 101).  Marland Kitchen acetaminophen (TYLENOL) 500 MG tablet Take 1,000 mg by mouth at bedtime.  Marland Kitchen aspirin EC 81 MG tablet Take 1 tablet (81 mg total) by mouth daily.  . Calcium Carbonate-Vitamin D (CALCIUM 600+D) 600-200 MG-UNIT TABS Take 1 tablet by mouth daily.   . citalopram (CELEXA) 20 MG tablet Take 20 mg by mouth daily.  . cyanocobalamin 1000 MCG tablet Take 1,000 mcg  by mouth daily.   . hydrocortisone (PROCTO-MED HC) 2.5 % rectal cream Place 1 application rectally every 12 (twelve) hours as needed for hemorrhoids or itching.  Marland Kitchen ibuprofen (ADVIL,MOTRIN) 200 MG tablet Take 400 mg by mouth every 6 (six) hours as needed (for pain).  Marland Kitchen levothyroxine (SYNTHROID, LEVOTHROID) 25 MCG tablet Take  25 mcg by mouth daily before breakfast.  . LORazepam (ATIVAN) 0.5 MG tablet Take 0.5 tablets (0.25 mg total) by mouth every 8 (eight) hours as needed for anxiety. Take one tablet every 8 hours as needed for anxiety  . metoprolol tartrate (LOPRESSOR) 25 MG tablet Take 25 mg by mouth 2 (two) times daily. 25mg  in the AM and 12.5 in the PM. Hold if SBP < 110  . mineral oil-hydrophilic petrolatum (AQUAPHOR) ointment Apply 1 application topically daily as needed for dry skin.  . mirtazapine (REMERON) 15 MG tablet Take 15 mg by mouth at bedtime.  . Nutritional Supplements (RESOURCE 2.0) LIQD Take 120 mLs by mouth 2 (two) times daily between meals. At 10AM and 4PM  . polyethylene glycol (MIRALAX / GLYCOLAX) packet Take 17 g by mouth daily.  . risperiDONE (RISPERDAL) 0.5 MG tablet Take 0.25 mg by mouth daily. At [facility]   No facility-administered encounter medications on file as of 11/09/2016.     Review of Systems  Unable to perform ROS: Dementia    Immunization History  Administered Date(s) Administered  . Influenza Whole 10/24/2011, 10/24/2012  . Influenza-Unspecified 11/06/2013, 11/04/2015, 11/02/2016  . PPD Test 07/27/2009, 12/28/2014, 01/11/2015  . Pneumococcal Polysaccharide-23 01/23/2009  . Pneumococcal-Unspecified 01/23/2005  . Td 07/14/2009   Pertinent  Health Maintenance Due  Topic Date Due  . DEXA SCAN  01/24/2023 (Originally 07/26/1984)  . PNA vac Low Risk Adult (2 of 2 - PCV13) 11/10/2023 (Originally 01/23/2010)  . INFLUENZA VACCINE  Completed   Fall Risk  08/28/2016 03/20/2016 09/21/2015 06/15/2015 01/05/2015  Falls in the past year? Yes Yes No No No  Number falls in past yr: 2 or more - - - -  Injury with Fall? Yes Yes - - -  Comment - fracture right knee - - -  Risk Factor Category  - High Fall Risk - - -   Functional Status Survey:    Vitals:   11/09/16 1050  BP: 125/69  Pulse: (!) 55  Resp: 16  Temp: (!) 96.4 F (35.8 C)  SpO2: 95%  Weight: 116 lb 11.2 oz (52.9 kg)    Height: 5\' 2"  (1.575 m)   Body mass index is 21.34 kg/m. Physical Exam  Constitutional:  Thin frail elderly in no acute distress   HENT:  Head: Normocephalic.  Right Ear: External ear normal.  Left Ear: External ear normal.  Mouth/Throat: Oropharynx is clear and moist. No oropharyngeal exudate.  Eyes: Pupils are equal, round, and reactive to light. Conjunctivae and EOM are normal. Right eye exhibits no discharge. Left eye exhibits no discharge. No scleral icterus.  Neck: Neck supple. No JVD present. No thyromegaly present.  Cardiovascular: Normal rate, regular rhythm, normal heart sounds and intact distal pulses.  Exam reveals no gallop and no friction rub.   No murmur heard. Pulmonary/Chest: Effort normal and breath sounds normal. No respiratory distress. She has no wheezes. She has no rales.  Abdominal: Soft. Bowel sounds are normal. She exhibits no distension. There is no tenderness. There is no rebound and no guarding.  Musculoskeletal: She exhibits no edema or tenderness.  Unsteady gait uses wheelchair with assistance. Moves x 4 extremities  without difficulties. Right hand finger contractures.   Lymphadenopathy:    She has no cervical adenopathy.  Neurological: She is alert.  Pleasantly confused at her baseline  Skin: Skin is warm and dry. No rash noted. No erythema. No pallor.  Skin intact  Psychiatric: She has a normal mood and affect.   Labs reviewed:  Recent Labs  01/21/16 1815 01/22/16 0532  03/17/16 0907 03/18/16 0847 07/03/16  NA  --  137  < > 139 137 141  K  --  4.2  < > 3.3* 4.0 4.3  CL  --  103  --  102 102  --   CO2  --  27  --  29 26  --   GLUCOSE  --  85  --  97 110*  --   BUN  --  25*  < > 17 24* 22*  CREATININE  --  0.96  < > 0.62 1.06* 0.7  CALCIUM  --  8.6*  --  9.1 9.0  --   MG 1.9  --   --   --   --   --   < > = values in this interval not displayed.  Recent Labs  12/13/15 01/21/16 1412 03/17/16 0907  AST 18 19 18   ALT 14 16 13*  ALKPHOS  71 62 59  BILITOT  --  0.8 1.1  PROT  --  6.4* 6.9  ALBUMIN  --  3.4* 4.2    Recent Labs  11/10/15 1452  01/21/16 1412 01/22/16 0532  03/17/16 0907 03/18/16 0431 07/03/16  WBC 8.1  < > 10.4 7.5  < > 10.0 11.5* 7.0  NEUTROABS 5.8  --  8.0*  --   --  6.8  --   --   HGB 13.1  < > 12.0 11.9*  < > 13.8 13.1 12.3  HCT 38.5  < > 35.5* 35.3*  < > 40.5 38.5 36  MCV 98.2  --  98.3 97.8  --  96.4 96.3  --   PLT 343  < > 363 327  < > 337 355 328  < > = values in this interval not displayed. Lab Results  Component Value Date   TSH 2.38 07/03/2016   Lab Results  Component Value Date   HGBA1C 5.5 07/06/2016   Lab Results  Component Value Date   CHOL 168 08/03/2016   HDL 56 08/03/2016   LDLCALC 93 08/03/2016   TRIG 96 08/03/2016   CHOLHDL 3.0 12/11/2014    Significant Diagnostic Results in last 30 days:  No results found.  Assessment/Plan 1. Unsteady gait Uses wheelchair with assist. She remains a high risk for falls due to her lack of safety awareness secondary to dementia. Continue with fall and safety precautions.   2. Fall at nursing home, initial encounter Afebrile.found sitting on the floor by facility staff had unwitnessed fall. B/p readings reviewed  Within normal range.Exam findings negative. Continue to monitor.  Family/ staff Communication: Reviewed plan of care with patient and facility Nurse.   Labs/tests ordered: None   Keiara Sneeringer C Akshara Blumenthal, NP

## 2016-11-13 DIAGNOSIS — R29898 Other symptoms and signs involving the musculoskeletal system: Secondary | ICD-10-CM | POA: Diagnosis not present

## 2016-11-13 DIAGNOSIS — G4700 Insomnia, unspecified: Secondary | ICD-10-CM | POA: Diagnosis not present

## 2016-11-13 DIAGNOSIS — F411 Generalized anxiety disorder: Secondary | ICD-10-CM | POA: Diagnosis not present

## 2016-11-13 DIAGNOSIS — E876 Hypokalemia: Secondary | ICD-10-CM | POA: Diagnosis not present

## 2016-11-13 DIAGNOSIS — H9190 Unspecified hearing loss, unspecified ear: Secondary | ICD-10-CM | POA: Diagnosis not present

## 2016-11-13 DIAGNOSIS — M6281 Muscle weakness (generalized): Secondary | ICD-10-CM | POA: Diagnosis not present

## 2016-11-13 LAB — TSH: TSH: 3.14 (ref 0.41–5.90)

## 2016-11-14 DIAGNOSIS — H9190 Unspecified hearing loss, unspecified ear: Secondary | ICD-10-CM | POA: Diagnosis not present

## 2016-11-14 DIAGNOSIS — R29898 Other symptoms and signs involving the musculoskeletal system: Secondary | ICD-10-CM | POA: Diagnosis not present

## 2016-11-14 DIAGNOSIS — F411 Generalized anxiety disorder: Secondary | ICD-10-CM | POA: Diagnosis not present

## 2016-11-14 DIAGNOSIS — M6281 Muscle weakness (generalized): Secondary | ICD-10-CM | POA: Diagnosis not present

## 2016-11-14 DIAGNOSIS — G4700 Insomnia, unspecified: Secondary | ICD-10-CM | POA: Diagnosis not present

## 2016-11-14 DIAGNOSIS — E876 Hypokalemia: Secondary | ICD-10-CM | POA: Diagnosis not present

## 2016-11-15 DIAGNOSIS — H9190 Unspecified hearing loss, unspecified ear: Secondary | ICD-10-CM | POA: Diagnosis not present

## 2016-11-15 DIAGNOSIS — R29898 Other symptoms and signs involving the musculoskeletal system: Secondary | ICD-10-CM | POA: Diagnosis not present

## 2016-11-15 DIAGNOSIS — M6281 Muscle weakness (generalized): Secondary | ICD-10-CM | POA: Diagnosis not present

## 2016-11-15 DIAGNOSIS — F411 Generalized anxiety disorder: Secondary | ICD-10-CM | POA: Diagnosis not present

## 2016-11-15 DIAGNOSIS — E876 Hypokalemia: Secondary | ICD-10-CM | POA: Diagnosis not present

## 2016-11-15 DIAGNOSIS — G4700 Insomnia, unspecified: Secondary | ICD-10-CM | POA: Diagnosis not present

## 2016-11-16 DIAGNOSIS — F411 Generalized anxiety disorder: Secondary | ICD-10-CM | POA: Diagnosis not present

## 2016-11-16 DIAGNOSIS — M6281 Muscle weakness (generalized): Secondary | ICD-10-CM | POA: Diagnosis not present

## 2016-11-16 DIAGNOSIS — R29898 Other symptoms and signs involving the musculoskeletal system: Secondary | ICD-10-CM | POA: Diagnosis not present

## 2016-11-16 DIAGNOSIS — E876 Hypokalemia: Secondary | ICD-10-CM | POA: Diagnosis not present

## 2016-11-16 DIAGNOSIS — G4700 Insomnia, unspecified: Secondary | ICD-10-CM | POA: Diagnosis not present

## 2016-11-16 DIAGNOSIS — H9190 Unspecified hearing loss, unspecified ear: Secondary | ICD-10-CM | POA: Diagnosis not present

## 2016-11-17 ENCOUNTER — Encounter: Payer: Self-pay | Admitting: Internal Medicine

## 2016-11-17 ENCOUNTER — Non-Acute Institutional Stay (SKILLED_NURSING_FACILITY): Payer: Medicare Other | Admitting: Internal Medicine

## 2016-11-17 DIAGNOSIS — M24541 Contracture, right hand: Secondary | ICD-10-CM | POA: Diagnosis not present

## 2016-11-17 DIAGNOSIS — I10 Essential (primary) hypertension: Secondary | ICD-10-CM

## 2016-11-17 DIAGNOSIS — F0281 Dementia in other diseases classified elsewhere with behavioral disturbance: Secondary | ICD-10-CM

## 2016-11-17 DIAGNOSIS — H9193 Unspecified hearing loss, bilateral: Secondary | ICD-10-CM

## 2016-11-17 DIAGNOSIS — E039 Hypothyroidism, unspecified: Secondary | ICD-10-CM

## 2016-11-17 DIAGNOSIS — G309 Alzheimer's disease, unspecified: Secondary | ICD-10-CM

## 2016-11-17 NOTE — Progress Notes (Signed)
Location:  [facility] Room Number: 36 Place of Service:  SNF [phone]) Provider:  Blanchie Serve MD  Blanchie Serve, MD  Patient Care Team: Blanchie Serve, MD as PCP - General (Internal Medicine) Melina Modena, [facility] Gaynelle Arabian, MD as Consulting Physician (Orthopedic Surgery) N[place], Nelda Bucks, NP as Nurse Practitioner (Family Medicine)  Extended Emergency Contact Information Primary Emergency Contact: [place] of [place] Mobile Phone: [phone] Relation: Daughter Secondary Emergency Contact: Kerby Less [place] of [place] Phone: [phone] Relation: Daughter  Code Status:  DNR  Goals of care: Advanced Directive information Advanced Directives 11/17/2016  Does Patient Have a Medical Advance Directive? Yes  Type of [place] of [place];Living will;Out of facility DNR (pink MOST or yellow form)  Does patient want to make changes to medical advance directive? No - Patient declined  Copy of [place] in Chart? Yes  Pre-existing out of facility DNR order (yellow form or pink MOST form) Yellow form placed in chart (order not valid for inpatient use)     Chief Complaint  Patient presents with  . Medical Management of Chronic Issues    Routine Visit     HPI:  Pt is a 83 y.o. female seen today for medical management of chronic diseases. She participates minimally in conversation. No concern from nursing staff today. Few elevated SBP. Had a fall on 11/08/16 with no apparent injury.   Hypertension- currently on metoprolol tartrate 25 mg am and 12.5 mg qhs.   Hypothyroidism- takes levothyroxine 25 mcg daily.   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 Currently on tylenol 1000 mg qhs with prn tylenol and prn ibuprofen order. Has not required prn ibuprofen at all.   Hearing loss- has hearing aid. Needs assistance in wearing them  Dementia with behavioral  disturbance- gets assistance with her ADLs, needs help to set up her meal trays and feeds herself. She needs assistance with her ADLs. Currently on risperdal and prn lorazepam for her behavior. Also on citalopram and remeron. Much calmer per nursing.    Past Medical History:  Diagnosis Date  . Abnormality of gait 03/15/2010  . Anxiety state, unspecified 03/15/2010  . Depression, major 06/15/2015  . Depressive disorder, not elsewhere classified 03/15/2010  . Dysphagia, unspecified(787.20) 03/15/2010  . Generalized anxiety disorder 06/23/2014  . Herpes zoster without mention of complication 6/81/1572  . Other alteration of consciousness 03/15/2010  . Pain in hand 08/13/12  . Personal history of fall 03/15/2010  . Senile osteoporosis 03/15/2010  . Unspecified constipation 03/15/2010  . Unspecified essential hypertension 03/15/2010  . Unspecified hearing loss 08/22/2011  . Unspecified urinary incontinence 03/15/2010  . Urinary incontinence 03/15/2010  . Vaginal prolapse 06/15/2015   Past Surgical History:  Procedure Laterality Date  . INCISION / DRAINAGE HAND / FINGER Right 06/2008   hand  . SPINE SURGERY  12/2006    Allergies  Allergen Reactions  . Ace Inhibitors Other (See Comments)    Reaction unknown/ listed on MAR  . Hydrochlorothiazide Other (See Comments)    Reaction unknown/ listed on MAR  . Morphine And Related Other (See Comments)    Hallucinations    Outpatient Encounter Prescriptions as of 11/17/2016  Medication Sig  . acetaminophen (TYLENOL) 325 MG tablet Take 2 tablets (650 mg total) by mouth every 6 (six) hours as needed for mild pain (or Fever >/= 101).  Marland Kitchen acetaminophen (TYLENOL) 500 MG tablet Take 1,000 mg by  mouth at bedtime.  Marland Kitchen aspirin EC 81 MG tablet Take 1 tablet (81 mg total) by mouth daily.  . Calcium Carbonate-Vitamin D (CALCIUM 600+D) 600-200 MG-UNIT TABS Take 1 tablet by mouth daily.   . citalopram (CELEXA) 20 MG tablet Take 20 mg by mouth daily.  . cyanocobalamin  1000 MCG tablet Take 1,000 mcg by mouth daily.   . hydrocortisone (PROCTO-MED HC) 2.5 % rectal cream Place 1 application rectally every 12 (twelve) hours as needed for hemorrhoids or itching.  Marland Kitchen ibuprofen (ADVIL,MOTRIN) 200 MG tablet Take 400 mg by mouth every 6 (six) hours as needed (for pain).  Marland Kitchen levothyroxine (SYNTHROID, LEVOTHROID) 25 MCG tablet Take 25 mcg by mouth daily before breakfast.  . LORazepam (ATIVAN) 0.5 MG tablet Take 0.25 mg by mouth every 8 (eight) hours as needed for anxiety (Reevaluate in 90 days(01-09-17)).  Marland Kitchen metoprolol tartrate (LOPRESSOR) 25 MG tablet Take 12.5 mg by mouth at bedtime.  . metoprolol tartrate (LOPRESSOR) 25 MG tablet Take 25 mg by mouth daily.  . mineral oil-hydrophilic petrolatum (AQUAPHOR) ointment Apply 1 application topically daily as needed for dry skin.  . mirtazapine (REMERON) 15 MG tablet Take 15 mg by mouth at bedtime.  . Nutritional Supplements (RESOURCE 2.0) LIQD Take 120 mLs by mouth 2 (two) times daily between meals. At 10AM and 4PM  . polyethylene glycol (MIRALAX / GLYCOLAX) packet Take 17 g by mouth daily.  . risperiDONE (RISPERDAL) 0.5 MG tablet Take 0.25 mg by mouth daily. At [facility]  . [DISCONTINUED] LORazepam (ATIVAN) 0.5 MG tablet Take 0.5 tablets (0.25 mg total) by mouth every 8 (eight) hours as needed for anxiety. Take one tablet every 8 hours as needed for anxiety  . [DISCONTINUED] metoprolol tartrate (LOPRESSOR) 25 MG tablet Take 25 mg by mouth 2 (two) times daily. 25mg  in the AM and 12.5 in the PM. Hold if SBP < 110   No facility-administered encounter medications on file as of 11/17/2016.     Review of Systems  Unable to perform ROS: Dementia (limited)  Constitutional: Negative for fatigue and fever.  HENT: Positive for hearing loss. Negative for mouth sores and trouble swallowing.   Respiratory: Negative for cough and shortness of breath.   Cardiovascular: Negative for chest pain.  Gastrointestinal: Negative for abdominal pain and  vomiting.  Musculoskeletal: Positive for gait problem.  Neurological: Negative for dizziness and headaches.  Psychiatric/Behavioral: Positive for behavioral problems and confusion.    Immunization History  Administered Date(s) Administered  . Influenza Whole 10/24/2011, 10/24/2012  . Influenza-Unspecified 11/06/2013, 11/04/2015, 11/02/2016  . PPD Test 07/27/2009, 12/28/2014, 01/11/2015  . Pneumococcal Polysaccharide-23 01/23/2009  . Pneumococcal-Unspecified 01/23/2005  . Td 07/14/2009   Pertinent  Health Maintenance Due  Topic Date Due  . DEXA SCAN  01/24/2023 (Originally 07/26/1984)  . PNA vac Low Risk Adult (2 of 2 - PCV13) 11/10/2023 (Originally 01/23/2010)  . INFLUENZA VACCINE  Completed   Fall Risk  08/28/2016 03/20/2016 09/21/2015 06/15/2015 01/05/2015  Falls in the past year? Yes Yes No No No  Number falls in past yr: 2 or more - - - -  Injury with Fall? Yes Yes - - -  Comment - fracture right knee - - -  Risk Factor Category  - High Fall Risk - - -   Functional Status Survey:    Vitals:   11/17/16 0918  BP: (!) 150/68  Pulse: 69  Resp: 18  Temp: 98 F (36.7 C)  TempSrc: Oral  SpO2: 96%  Weight: 119 lb 3.2  oz (54.1 kg)  Height: 5\' 2"  (1.575 m)   Body mass index is 21.8 kg/m.   Wt Readings from Last 3 Encounters:  11/17/16 119 lb 3.2 oz (54.1 kg)  11/09/16 116 lb 11.2 oz (52.9 kg)  10/17/16 114 lb 11.2 oz (52 kg)   Physical Exam  Constitutional: No distress.  Elderly female, frail, thin built  HENT:  Head: Normocephalic and atraumatic.  Mouth/Throat: Oropharynx is clear and moist. No oropharyngeal exudate.  Eyes: Pupils are equal, round, and reactive to light. EOM are normal.  Neck: Normal range of motion. Neck supple.  Cardiovascular: Normal rate and regular rhythm.   Pulmonary/Chest: Effort normal and breath sounds normal. No respiratory distress. She has no wheezes. She has no rales.  Abdominal: Soft. Bowel sounds are normal. There is no tenderness. There  is no guarding.  Musculoskeletal: She exhibits deformity. She exhibits no edema.  Able to move all 4 extremities. On wheelchair. Needs assistance with transfers. Contracture to right middle and ring finger. Kyphosis and scoliosis present  Lymphadenopathy:    She has no cervical adenopathy.  Neurological: She is alert.  Oriented to self only  Skin: Skin is warm and dry. She is not diaphoretic.  Psychiatric: She has a normal mood and affect.  Pleasantly confused    Labs reviewed:  Recent Labs  01/21/16 1815 01/22/16 0532  03/17/16 0907 03/18/16 0847 07/03/16  NA  --  137  < > 139 137 141  K  --  4.2  < > 3.3* 4.0 4.3  CL  --  103  --  102 102  --   CO2  --  27  --  29 26  --   GLUCOSE  --  85  --  97 110*  --   BUN  --  25*  < > 17 24* 22*  CREATININE  --  0.96  < > 0.62 1.06* 0.7  CALCIUM  --  8.6*  --  9.1 9.0  --   MG 1.9  --   --   --   --   --   < > = values in this interval not displayed.  Recent Labs  12/13/15 01/21/16 1412 03/17/16 0907  AST 18 19 18   ALT 14 16 13*  ALKPHOS 71 62 59  BILITOT  --  0.8 1.1  PROT  --  6.4* 6.9  ALBUMIN  --  3.4* 4.2    Recent Labs  01/21/16 1412 01/22/16 0532  03/17/16 0907 03/18/16 0431 07/03/16  WBC 10.4 7.5  < > 10.0 11.5* 7.0  NEUTROABS 8.0*  --   --  6.8  --   --   HGB 12.0 11.9*  < > 13.8 13.1 12.3  HCT 35.5* 35.3*  < > 40.5 38.5 36  MCV 98.3 97.8  --  96.4 96.3  --   PLT 363 327  < > 337 355 328  < > = values in this interval not displayed. Lab Results  Component Value Date   TSH 3.14 11/13/2016   Lab Results  Component Value Date   HGBA1C 5.5 07/06/2016   Lab Results  Component Value Date   CHOL 168 08/03/2016   HDL 56 08/03/2016   LDLCALC 93 08/03/2016   TRIG 96 08/03/2016   CHOLHDL 3.0 12/11/2014    Significant Diagnostic Results in last 30 days:  No results found.  Assessment/Plan  Hypertension Few elevated readings with systolic in 010X and 1 reading of 166/88. With her age  and high fall  risk with unsteady gait and poor safety awareness, will not change BP med. Goal BP <150/90. Continue  metoprolol tartrate 25 mg am and 12.5 mg qhs. Monitor clinically.   Hypothyroidism Lab Results  Component Value Date   TSH 3.14 11/13/2016   Stable, continue levothyroxine 25 mcg daily.   Contracture of fingers Continue f/u with OT and to wear splint to her fingers. Continue tylenol 1000 mg qhs with prn tylenol. D/c prn ibuprofen order.   Hearing loss Continue with hearing aid. New battery awaiting.   Dementia with behavioral disturbance Behavior has been calm for most part of the day per nursing. Continue risperdal, citalopram and remeron with  prn lorazepam for now. Supportive care. With her advanced dementia, unable to perform MMSE.    Family/ staff Communication: reviewed care plan with patient and charge nurse.    Labs/tests ordered:  none  Blanchie Serve, MD Internal Medicine [facility] [address] [place], [place] [address] Cell Phone (Monday-Friday 8 am - 5 pm): [phone] On Call: [phone] and follow prompts after 5 pm and on weekends Office Phone: [phone] Office Fax: [phone]

## 2016-11-21 ENCOUNTER — Encounter (HOSPITAL_COMMUNITY): Payer: Self-pay | Admitting: Emergency Medicine

## 2016-11-21 ENCOUNTER — Inpatient Hospital Stay (HOSPITAL_COMMUNITY)
Admission: EM | Admit: 2016-11-21 | Discharge: 2016-11-24 | DRG: 064 | Disposition: A | Discharge status: Skilled Nursing Facility | Payer: Medicare Other | Attending: Neurology | Admitting: Neurology

## 2016-11-21 DIAGNOSIS — Z7982 Long term (current) use of aspirin: Secondary | ICD-10-CM

## 2016-11-21 DIAGNOSIS — H9190 Unspecified hearing loss, unspecified ear: Secondary | ICD-10-CM | POA: Diagnosis present

## 2016-11-21 DIAGNOSIS — Z993 Dependence on wheelchair: Secondary | ICD-10-CM

## 2016-11-21 DIAGNOSIS — G936 Cerebral edema: Secondary | ICD-10-CM | POA: Diagnosis not present

## 2016-11-21 DIAGNOSIS — F411 Generalized anxiety disorder: Secondary | ICD-10-CM | POA: Diagnosis present

## 2016-11-21 DIAGNOSIS — Z79899 Other long term (current) drug therapy: Secondary | ICD-10-CM

## 2016-11-21 DIAGNOSIS — F0281 Dementia in other diseases classified elsewhere with behavioral disturbance: Secondary | ICD-10-CM | POA: Diagnosis not present

## 2016-11-21 DIAGNOSIS — I10 Essential (primary) hypertension: Secondary | ICD-10-CM | POA: Diagnosis present

## 2016-11-21 DIAGNOSIS — I619 Nontraumatic intracerebral hemorrhage, unspecified: Secondary | ICD-10-CM | POA: Diagnosis not present

## 2016-11-21 DIAGNOSIS — Z885 Allergy status to narcotic agent status: Secondary | ICD-10-CM

## 2016-11-21 DIAGNOSIS — E854 Organ-limited amyloidosis: Secondary | ICD-10-CM | POA: Diagnosis present

## 2016-11-21 DIAGNOSIS — N390 Urinary tract infection, site not specified: Secondary | ICD-10-CM | POA: Diagnosis not present

## 2016-11-21 DIAGNOSIS — R400 Somnolence: Secondary | ICD-10-CM

## 2016-11-21 DIAGNOSIS — Z66 Do not resuscitate: Secondary | ICD-10-CM | POA: Diagnosis not present

## 2016-11-21 DIAGNOSIS — G309 Alzheimer's disease, unspecified: Secondary | ICD-10-CM | POA: Diagnosis present

## 2016-11-21 DIAGNOSIS — Z888 Allergy status to other drugs, medicaments and biological substances status: Secondary | ICD-10-CM

## 2016-11-21 DIAGNOSIS — I611 Nontraumatic intracerebral hemorrhage in hemisphere, cortical: Secondary | ICD-10-CM | POA: Diagnosis not present

## 2016-11-21 DIAGNOSIS — F329 Major depressive disorder, single episode, unspecified: Secondary | ICD-10-CM | POA: Diagnosis present

## 2016-11-21 DIAGNOSIS — R4182 Altered mental status, unspecified: Secondary | ICD-10-CM | POA: Diagnosis not present

## 2016-11-21 DIAGNOSIS — I629 Nontraumatic intracranial hemorrhage, unspecified: Secondary | ICD-10-CM

## 2016-11-21 DIAGNOSIS — R031 Nonspecific low blood-pressure reading: Secondary | ICD-10-CM | POA: Diagnosis not present

## 2016-11-21 DIAGNOSIS — R402422 Glasgow coma scale score 9-12, at arrival to emergency department: Secondary | ICD-10-CM | POA: Diagnosis present

## 2016-11-21 DIAGNOSIS — I680 Cerebral amyloid angiopathy: Secondary | ICD-10-CM | POA: Diagnosis present

## 2016-11-21 DIAGNOSIS — M810 Age-related osteoporosis without current pathological fracture: Secondary | ICD-10-CM | POA: Diagnosis present

## 2016-11-21 NOTE — ED Notes (Signed)
Called [facility]. The staff there said that the pt is normally verbal, she is very hard of hearing without her hearing aids. Her left ear is better than her right. She can stand to transfer but is wheelchair bound. Does not know event leading up to arrival to ED.

## 2016-11-21 NOTE — ED Notes (Signed)
Pt will not respond to staff. Will look at you when you speak to her. She has full ROM of UE. [place] for more information about pts baseline.

## 2016-11-21 NOTE — ED Triage Notes (Signed)
Pt brought in by EMS from [facility]  Staff reports the pt had a systolic blood pressure of 87 and was more confused than normal  Pt has hx of dementia  EMS reports pt's B/P of 129/62

## 2016-11-22 ENCOUNTER — Inpatient Hospital Stay (HOSPITAL_COMMUNITY): Payer: Medicare Other

## 2016-11-22 ENCOUNTER — Emergency Department (HOSPITAL_COMMUNITY): Payer: Medicare Other

## 2016-11-22 ENCOUNTER — Other Ambulatory Visit (HOSPITAL_COMMUNITY): Payer: Medicare Other

## 2016-11-22 DIAGNOSIS — G936 Cerebral edema: Secondary | ICD-10-CM

## 2016-11-22 DIAGNOSIS — I680 Cerebral amyloid angiopathy: Secondary | ICD-10-CM | POA: Diagnosis not present

## 2016-11-22 DIAGNOSIS — G309 Alzheimer's disease, unspecified: Secondary | ICD-10-CM | POA: Diagnosis present

## 2016-11-22 DIAGNOSIS — Z993 Dependence on wheelchair: Secondary | ICD-10-CM | POA: Diagnosis not present

## 2016-11-22 DIAGNOSIS — R402422 Glasgow coma scale score 9-12, at arrival to emergency department: Secondary | ICD-10-CM | POA: Diagnosis present

## 2016-11-22 DIAGNOSIS — R4182 Altered mental status, unspecified: Secondary | ICD-10-CM | POA: Diagnosis not present

## 2016-11-22 DIAGNOSIS — I629 Nontraumatic intracranial hemorrhage, unspecified: Secondary | ICD-10-CM

## 2016-11-22 DIAGNOSIS — Z885 Allergy status to narcotic agent status: Secondary | ICD-10-CM | POA: Diagnosis not present

## 2016-11-22 DIAGNOSIS — F411 Generalized anxiety disorder: Secondary | ICD-10-CM | POA: Diagnosis present

## 2016-11-22 DIAGNOSIS — R400 Somnolence: Secondary | ICD-10-CM | POA: Diagnosis not present

## 2016-11-22 DIAGNOSIS — G464 Cerebellar stroke syndrome: Secondary | ICD-10-CM | POA: Diagnosis not present

## 2016-11-22 DIAGNOSIS — I6200 Nontraumatic subdural hemorrhage, unspecified: Secondary | ICD-10-CM | POA: Diagnosis not present

## 2016-11-22 DIAGNOSIS — Z79899 Other long term (current) drug therapy: Secondary | ICD-10-CM | POA: Diagnosis not present

## 2016-11-22 DIAGNOSIS — F329 Major depressive disorder, single episode, unspecified: Secondary | ICD-10-CM | POA: Diagnosis present

## 2016-11-22 DIAGNOSIS — I611 Nontraumatic intracerebral hemorrhage in hemisphere, cortical: Secondary | ICD-10-CM | POA: Diagnosis not present

## 2016-11-22 DIAGNOSIS — Z7982 Long term (current) use of aspirin: Secondary | ICD-10-CM | POA: Diagnosis not present

## 2016-11-22 DIAGNOSIS — I619 Nontraumatic intracerebral hemorrhage, unspecified: Secondary | ICD-10-CM | POA: Diagnosis present

## 2016-11-22 DIAGNOSIS — Z66 Do not resuscitate: Secondary | ICD-10-CM | POA: Diagnosis not present

## 2016-11-22 DIAGNOSIS — N390 Urinary tract infection, site not specified: Secondary | ICD-10-CM | POA: Diagnosis present

## 2016-11-22 DIAGNOSIS — Z888 Allergy status to other drugs, medicaments and biological substances status: Secondary | ICD-10-CM | POA: Diagnosis not present

## 2016-11-22 DIAGNOSIS — F0281 Dementia in other diseases classified elsewhere with behavioral disturbance: Secondary | ICD-10-CM | POA: Diagnosis present

## 2016-11-22 DIAGNOSIS — M810 Age-related osteoporosis without current pathological fracture: Secondary | ICD-10-CM | POA: Diagnosis present

## 2016-11-22 DIAGNOSIS — H9190 Unspecified hearing loss, unspecified ear: Secondary | ICD-10-CM | POA: Diagnosis present

## 2016-11-22 DIAGNOSIS — E854 Organ-limited amyloidosis: Secondary | ICD-10-CM | POA: Diagnosis present

## 2016-11-22 DIAGNOSIS — I10 Essential (primary) hypertension: Secondary | ICD-10-CM | POA: Diagnosis present

## 2016-11-22 LAB — CBC WITH DIFFERENTIAL/PLATELET
Basophils Absolute: 0 K/uL (ref 0.0–0.1)
Basophils Relative: 0 %
Eosinophils Absolute: 0 K/uL (ref 0.0–0.7)
Eosinophils Relative: 0 %
HCT: 37 % (ref 36.0–46.0)
Hemoglobin: 12.7 g/dL (ref 12.0–15.0)
Lymphocytes Relative: 15 %
Lymphs Abs: 1.5 K/uL (ref 0.7–4.0)
MCH: 34.2 pg — ABNORMAL HIGH (ref 26.0–34.0)
MCHC: 34.3 g/dL (ref 30.0–36.0)
MCV: 99.7 fL (ref 78.0–100.0)
Monocytes Absolute: 1 K/uL (ref 0.1–1.0)
Monocytes Relative: 10 %
Neutro Abs: 7.3 K/uL (ref 1.7–7.7)
Neutrophils Relative %: 75 %
Platelets: 334 K/uL (ref 150–400)
RBC: 3.71 MIL/uL — ABNORMAL LOW (ref 3.87–5.11)
RDW: 13.8 % (ref 11.5–15.5)
WBC: 9.9 K/uL (ref 4.0–10.5)

## 2016-11-22 LAB — COMPREHENSIVE METABOLIC PANEL WITH GFR
ALT: 19 U/L (ref 14–54)
AST: 31 U/L (ref 15–41)
Albumin: 3.7 g/dL (ref 3.5–5.0)
Alkaline Phosphatase: 90 U/L (ref 38–126)
Anion gap: 13 (ref 5–15)
BUN: 40 mg/dL — ABNORMAL HIGH (ref 6–20)
CO2: 26 mmol/L (ref 22–32)
Calcium: 9 mg/dL (ref 8.9–10.3)
Chloride: 98 mmol/L — ABNORMAL LOW (ref 101–111)
Creatinine, Ser: 0.93 mg/dL (ref 0.44–1.00)
GFR calc Af Amer: 58 mL/min — ABNORMAL LOW
GFR calc non Af Amer: 50 mL/min — ABNORMAL LOW
Glucose, Bld: 115 mg/dL — ABNORMAL HIGH (ref 65–99)
Potassium: 4.3 mmol/L (ref 3.5–5.1)
Sodium: 137 mmol/L (ref 135–145)
Total Bilirubin: 1.5 mg/dL — ABNORMAL HIGH (ref 0.3–1.2)
Total Protein: 7.4 g/dL (ref 6.5–8.1)

## 2016-11-22 LAB — MRSA PCR SCREENING: MRSA BY PCR: NEGATIVE

## 2016-11-22 LAB — URINALYSIS, ROUTINE W REFLEX MICROSCOPIC
Bilirubin Urine: NEGATIVE
Glucose, UA: NEGATIVE mg/dL
Hgb urine dipstick: NEGATIVE
Ketones, ur: 20 mg/dL — AB
Nitrite: NEGATIVE
Protein, ur: 100 mg/dL — AB
Specific Gravity, Urine: 1.024 (ref 1.005–1.030)
pH: 5 (ref 5.0–8.0)

## 2016-11-22 LAB — CBG MONITORING, ED: Glucose-Capillary: 124 mg/dL — ABNORMAL HIGH (ref 65–99)

## 2016-11-22 LAB — I-STAT CG4 LACTIC ACID, ED: Lactic Acid, Venous: 1.36 mmol/L (ref 0.5–1.9)

## 2016-11-22 MED ORDER — DEXTROSE 5 % IV SOLN
1.0000 g | Freq: Every day | INTRAVENOUS | Status: DC
  Administered 2016-11-22 – 2016-11-24 (×3): 1 g via INTRAVENOUS
  Filled 2016-11-22 (×4): qty 10

## 2016-11-22 MED ORDER — SODIUM CHLORIDE 0.9 % IV SOLN
INTRAVENOUS | Status: DC
  Administered 2016-11-22: 10:00:00 via INTRAVENOUS

## 2016-11-22 MED ORDER — CHLORHEXIDINE GLUCONATE 0.12 % MT SOLN
15.0000 mL | Freq: Two times a day (BID) | OROMUCOSAL | Status: DC
  Administered 2016-11-22 – 2016-11-24 (×3): 15 mL via OROMUCOSAL
  Filled 2016-11-22 (×2): qty 15

## 2016-11-22 MED ORDER — ACETAMINOPHEN 160 MG/5ML PO SOLN: 650.0000 mg | ORAL | Status: DC | PRN

## 2016-11-22 MED ORDER — ACETAMINOPHEN 650 MG RE SUPP: 650.0000 mg | RECTAL | Status: DC | PRN

## 2016-11-22 MED ORDER — PANTOPRAZOLE SODIUM 40 MG IV SOLR
40.0000 mg | Freq: Every day | INTRAVENOUS | Status: DC
  Administered 2016-11-22 – 2016-11-23 (×2): 40 mg via INTRAVENOUS
  Filled 2016-11-22 (×2): qty 40

## 2016-11-22 MED ORDER — ORAL CARE MOUTH RINSE
15.0000 mL | Freq: Two times a day (BID) | OROMUCOSAL | Status: DC
  Administered 2016-11-22 – 2016-11-24 (×3): 15 mL via OROMUCOSAL

## 2016-11-22 MED ORDER — ACETAMINOPHEN 325 MG PO TABS: 650.0000 mg | ORAL_TABLET | ORAL | Status: DC | PRN

## 2016-11-22 MED ORDER — STROKE: EARLY STAGES OF RECOVERY BOOK
Freq: Once | Status: AC
  Administered 2016-11-22: 05:00:00
  Filled 2016-11-22: qty 1

## 2016-11-22 MED ORDER — NICARDIPINE HCL IN NACL 20-0.86 MG/200ML-% IV SOLN
0.0000 mg/h | INTRAVENOUS | Status: DC
  Administered 2016-11-22 (×2): 2 mg/h via INTRAVENOUS
  Administered 2016-11-22: 5 mg/h via INTRAVENOUS
  Administered 2016-11-23: 2 mg/h via INTRAVENOUS
  Filled 2016-11-22 (×4): qty 200

## 2016-11-22 NOTE — Progress Notes (Signed)
PT Cancellation Note  Patient Details Name: [name] MRN: [id number] DOB: 10-Feb-1917   Cancelled Treatment:    Reason Eval/Treat Not Completed: Medical issues which prohibited therapy (pt currently on bedrest and await increased activity orders for evaluation)   [place] B Tziporah Knoke 11/22/2016, 7:24 AM  Elwyn Reach, [place]

## 2016-11-22 NOTE — Care Management Note (Signed)
Case Management Note  Patient Details  Name: [name] MRN: [id number] Date of Birth: November 18, 1917  Subjective/Objective:  Pt with hx of dementia presented on 11/22/16 with [facility].  PTA, pt resided at [facility].                  Action/Plan: Will follow progress.  PT/OT consults pending; referral to CSW to facilitate return to SNF upon medical stability.    Expected Discharge Date:                  Expected Discharge Plan:  Skilled Nursing Facility  In-House Referral:  Clinical Social Work  Discharge planning Services  CM Consult  Post Acute Care Choice:    Choice offered to:     DME Arranged:    DME Agency:     HH Arranged:    [place] Agency:     Status of Service:  In process, will continue to follow  If discussed at Long Length of Stay Meetings, dates discussed:    Additional Comments:  Reinaldo Raddle, RN, BSN  Trauma/Neuro ICU Case Manager [phone]

## 2016-11-22 NOTE — Progress Notes (Signed)
[facility] STROKE TEAM PROGRESS NOTE   SUBJECTIVE (INTERVAL HISTORY) Per nurse report patient had no new acute events reported overnight. Patients exam remains unchanged. Not following any direct commands   OBJECTIVE  Recent Labs Lab 11/22/16 0046  GLUCAP 124*    Recent Labs Lab 11/22/16 0003  NA 137  K 4.3  CL 98*  CO2 26  GLUCOSE 115*  BUN 40*  CREATININE 0.93  CALCIUM 9.0    Recent Labs Lab 11/22/16 0003  AST 31  ALT 19  ALKPHOS 90  BILITOT 1.5*  PROT 7.4  ALBUMIN 3.7    Recent Labs Lab 11/22/16 0003  WBC 9.9  NEUTROABS 7.3  HGB 12.7  HCT 37.0  MCV 99.7  PLT 334   No results for input(s): CKTOTAL, CKMB, CKMBINDEX, TROPONINI in the last 168 hours. No results for input(s): LABPROT, INR in the last 72 hours.  Recent Labs  11/22/16 0003  COLORURINE AMBER*  LABSPEC 1.024  PHURINE 5.0  GLUCOSEU NEGATIVE  HGBUR NEGATIVE  BILIRUBINUR NEGATIVE  KETONESUR 20*  PROTEINUR 100*  NITRITE NEGATIVE  LEUKOCYTESUR TRACE*       Component Value Date/Time   CHOL 168 08/03/2016   TRIG 96 08/03/2016   HDL 56 08/03/2016   CHOLHDL 3.0 12/11/2014 0540   VLDL 21 12/11/2014 0540   LDLCALC 93 08/03/2016   Lab Results  Component Value Date   HGBA1C 5.5 07/06/2016   No results found for: LABOPIA, COCAINSCRNUR, LABBENZ, AMPHETMU, THCU, LABBARB  No results for input(s): ETH in the last 168 hours.  IMAGING: I have personally reviewed the radiological images below and agree with the radiology interpretations.  Ct Head Wo Contrast Result Date: 11/22/2016 IMPRESSION: 1. Right occipital intraparenchymal hematoma measuring 17 mL without associated mass effect or hydrocephalus. The location in a patient of this age is suggestive of hemorrhage secondary to amyloid angiopathy. This could be further assessed with MRI and, if necessary. 2. Advanced atrophy and chronic ischemic microangiopathic changes.   PHYSICAL EXAM Temp:  [97.3 F (36.3 C)-99.7 F (37.6 C)] 99.7 F (37.6  C) (10/31 1154) Pulse Rate:  [31-112] 59 (10/31 1115) Resp:  [8-27] 15 (10/31 1145) BP: (96-159)/(43-77) 126/56 (10/31 1145) SpO2:  [60 %-100 %] 60 % (10/31 1115)  General - Well nourished, well developed, in no apparent distress Respiratory - No labored breathing noted on exam Cardiovascular - Regular rate and rhythm   NEURO Baseline: Per record patient does not participate that much in conversation at baseline due to dementia, but does talk and answer questions.  She is wheelchair-bound. Feeds self per reports  Mental Status: Patient is awake, will open eyes to loud voice, she does not follow commands she will occasionally say one or two words Cranial Nerves: II: + blinks to threat bilaterally. Pupils are equal, round, and reactive to light. +/- tracking  III,IV, VI: EOMI without ptosis or diploplia.  V: Facial sensation is symmetric to temperature VII: Facial movement is symmetric.  VIII: hearing is intact to voice, HOH X: Uvula elevates symmetrically XI: Shoulder shrug is symmetric. XII: tongue is midline without atrophy or fasciculations.  Motor: She keeps her right hand clenched into a fist, but does move her arm voluntarily, likely weak in RUE, withdraws all 3 other extremities. Increased tone throughout with paratonia and grasp reflex Sensory: Withdraws all 4 extremities to noxious stimuli  Cerebellar: She does not perform   ASSESSMENT AND PLAN: [name] is a 83 y.o. female with PMH of dementia admitted for  ICH, Score of 2   Small posterior Right occipital intraparenchymal hematoma: Likely secondary to Amyloid Angiopathy   Resultant:  Sleepy, Not following commands   VTE prophylaxis: SCD's   Diet NPO time specified         Therapy recommendations:  PENDING       Disposition: PENDING           Recommendations:   Repeat Head CT to monitor intracranial swelling  Closely watch Neuro exam. Poor prognosis. Family may consider comfort care in AM if no  signs of improvement.  Maintain IVF's at 50cc/hr  Check CBC and CMP in AM  Hypertension: Stable, no sedation or HTN meds in progress Continue to monitor closely  UTI:  Continue IV Rocephin for now  Dementia  Appears at baseline  Will continue to monitor closely  Hospital day # 0   Renie Ora Stroke Neurology Team 11/22/2016 4:21 PM I have personally examined this patient, reviewed notes, independently viewed imaging studies, participated in medical decision making and plan of care.ROS completed by me personally and pertinent positives fully documented  I have made any additions or clarifications directly to the above note. Agree with note above.  She has  poor neurological baseline from her advanced dementia and CT scan of the head shows a posterior right parieto-occipital hemorrhage likely from underlying amyloid angiopathy. I had a telephonic discussion with the patient's daughter who understands appropriate prognosis and agrees with DO NOT RESUSCITATE and does not want a feeding tube. She is likely to make the patient comfort care if her condition declines further but would like to watch her for 1-2 days. Maintain strict control of blood pressure and close neurological monitoring. This patient is critically ill and at significant risk of neurological worsening, death and care requires constant monitoring of vital signs, hemodynamics,respiratory and cardiac monitoring, extensive review of multiple databases, frequent neurological assessment, discussion with family, other specialists and medical decision making of high complexity.I have made any additions or clarifications directly to the above note.This critical care time does not reflect procedure time, or teaching time or supervisory time of PA/NP/Med Resident etc but could involve care discussion time.  I spent 30 minutes of neurocritical care time  in the care of  this patient.      Antony Contras, MD Medical  Director [facility] Pager: [phone] 11/22/2016 4:55 PM  To contact Stroke Continuity provider, please refer to [email or web address] After hours, contact General Neurology

## 2016-11-22 NOTE — H&P (Signed)
Neurology H&P  CC: Altered mental status  History is obtained from: Chart review  HPI: [name] is a 83 y.o. female with a history of dementia who presents with ICH.  At baseline, she needs assistance with her ADLs, feeds herself but needs her tray set up.  She apparently does not participate that much in conversation at baseline, but does talk and answer questions.  She is wheelchair-bound at baseline.   LKW: Unclear tpa given?: no, ICH ICH Score: 2 Modified Rankin Score:4  ROS: Unable to obtain due to altered mental status.   Past Medical History:  Diagnosis Date  . Abnormality of gait 03/15/2010  . Anxiety state, unspecified 03/15/2010  . Depression, major 06/15/2015  . Depressive disorder, not elsewhere classified 03/15/2010  . Dysphagia, unspecified(787.20) 03/15/2010  . Generalized anxiety disorder 06/23/2014  . Herpes zoster without mention of complication 7/82/9562  . Other alteration of consciousness 03/15/2010  . Pain in hand 08/13/12  . Personal history of fall 03/15/2010  . Senile osteoporosis 03/15/2010  . Unspecified constipation 03/15/2010  . Unspecified essential hypertension 03/15/2010  . Unspecified hearing loss 08/22/2011  . Unspecified urinary incontinence 03/15/2010  . Urinary incontinence 03/15/2010  . Vaginal prolapse 06/15/2015     Family History  Problem Relation Age of Onset  . Heart disease Father        MI     Social History:  reports that she has never smoked. She has never used smokeless tobacco. She reports that she drinks alcohol. She reports that she does not use drugs.   Exam: Current vital signs: BP (!) 151/77   Pulse (!) 47   Temp 97.7 F (36.5 C) (Axillary)   Resp 15   SpO2 95%  Vital signs in last 24 hours: Temp:  [97.3 F (36.3 C)-97.7 F (36.5 C)] 97.7 F (36.5 C) (10/31 0440) Pulse Rate:  [33-104] 47 (10/31 0445) Resp:  [8-19] 15 (10/31 0445) BP: (96-159)/(48-77) 151/77 (10/31 0445) SpO2:  [90 %-97 %] 95 % (10/31  0445)  Physical Exam  Constitutional: Appears elderly Psych: Appears fearful Eyes: No scleral injection HENT: No OP obstrucion Head: Normocephalic.  Cardiovascular: Normal rate and regular rhythm.  Respiratory: Effort normal and breath sounds normal to anterior ascultation GI: Soft.  No distension. There is no tenderness.  Skin: WDI  Neuro: Mental Status: Patient is awake, she does not follow commands she repeatedly states "no no no" Cranial Nerves: II: She blinks to threat from the right but not left. Pupils are equal, round, and reactive to light.   III,IV, VI: EOMI without ptosis or diploplia.  V: Facial sensation is symmetric to temperature VII: Facial movement is symmetric.  VIII: hearing is intact to voice X: Uvula elevates symmetrically XI: Shoulder shrug is symmetric. XII: tongue is midline without atrophy or fasciculations.  Motor: She keeps her right hand clenched into a fist, but does move her arm voluntarily, withdraws all 3 other extremities Sensory: Withdraws all 4 extremities to noxious stimuli  Cerebellar: She does not perform   I have reviewed labs in epic and the results pertinent to this consultation are: CMP-mildly elevated BUN CBC-unremarkable ua-possible UTI  I have reviewed the images obtained: CT head- right occipital IPH  Impression: 83 year old female with intracranial hematoma which is likely due to amyloid angiopathy.  Also possible would be hemorrhagic conversion of an ischemic infarct, hemorrhagic tumor.  Given the relatively small size, I do suspect that this is a survivable hemorrhage.  Altered mental status could be  due to the hemorrhage coupled with the extremely poor protoplasm, but also will do EEG to rule out ongoing seizure.  Another possibility would be that it is due to a urinary tract infection, and though the urinalysis is not definite, it is suggestive of UTI.  Recommendations: 1) Admit to ICU 2) no antiplatelets or  anticoagulants 3) blood pressure control with goal systolic 762 - 831 4) Frequent neuro checks 5) If symptoms worsen or there is decreased mental status, repeat stat head CT 6) PT,OT,ST 7) nicardipine for BP control 8) EEG to rule out seizure 9) Rocephin for possible UTI    This patient is critically ill and at significant risk of neurological worsening, death and care requires constant monitoring of vital signs, hemodynamics,respiratory and cardiac monitoring, neurological assessment, discussion with family, other specialists and medical decision making of high complexity. I spent 50 minutes of neurocritical care time  in the care of  this patient.  Roland Rack, MD [facility] [phone]  If 7pm- 7am, please page neurology on call as listed in [place]. 11/22/2016  5:04 AM

## 2016-11-22 NOTE — Evaluation (Signed)
Clinical/Bedside Swallow Evaluation Patient Details  Name: [name] MRN: [id number] Date of Birth: [birth year]  Today's Date: 11/22/2016 Time: SLP Start Time (ACUTE ONLY): 59 SLP Stop Time (ACUTE ONLY): 1053 SLP Time Calculation (min) (ACUTE ONLY): 10 min  Past Medical History:  Past Medical History:  Diagnosis Date  . Abnormality of gait 03/15/2010  . Anxiety state, unspecified 03/15/2010  . Depression, major 06/15/2015  . Depressive disorder, not elsewhere classified 03/15/2010  . Dysphagia, unspecified(787.20) 03/15/2010  . Generalized anxiety disorder 06/23/2014  . Herpes zoster without mention of complication 06/20/4130  . Other alteration of consciousness 03/15/2010  . Pain in hand 08/13/12  . Personal history of fall 03/15/2010  . Senile osteoporosis 03/15/2010  . Unspecified constipation 03/15/2010  . Unspecified essential hypertension 03/15/2010  . Unspecified hearing loss 08/22/2011  . Unspecified urinary incontinence 03/15/2010  . Urinary incontinence 03/15/2010  . Vaginal prolapse 06/15/2015   Past Surgical History:  Past Surgical History:  Procedure Laterality Date  . INCISION / DRAINAGE HAND / FINGER Right 06/2008   hand  . SPINE SURGERY  12/2006   HPI:  [name] a 83 y.o.femalewith a history of dementia who presents with ICH. At baseline, she needs assistance with her ADLs, feeds herself but needs her tray set up. She apparently does not participate that much in conversation at baseline, but does talk and answer questions. She is wheelchair-bound at baseline.   Assessment / Plan / Recommendation Clinical Impression  BSE attempted; examination limited 2/2 pt's inability to be aroused. When ice chips were placed in pt's mouth, she controled bolus and demonstrated adequate swallow function; however, was unable to be fully awakened for any other bolus presentations despite max verbal/tactile cueing. SLP placed spoonful of applesauce in pt's mouth in an  attempt to elicit oral movement; poor awareness of bolus resulted in need for suctioning. Suspect that current state is not pt's baseline and swallow function can be fully assessed with improved arousal. Will f/u tomorrow to re-attempt BSE.    SLP Visit Diagnosis: Dysphagia, unspecified (R13.10)    Aspiration Risk  Risk for inadequate nutrition/hydration    Diet Recommendation NPO        Other  Recommendations Oral Care Recommendations: Oral care BID   Follow up Recommendations Skilled Nursing facility      Frequency and Duration min 2x/week  2 weeks       Prognosis Prognosis for Safe Diet Advancement: Good Barriers to Reach Goals: Cognitive deficits;Severity of deficits      Swallow Study   General HPI: [name] a 83 y.o.femalewith a history of dementia who presents with ICH. At baseline, she needs assistance with her ADLs, feeds herself but needs her tray set up. She apparently does not participate that much in conversation at baseline, but does talk and answer questions. She is wheelchair-bound at baseline. Type of Study: Bedside Swallow Evaluation Diet Prior to this Study: NPO Temperature Spikes Noted: No Respiratory Status: Room air Behavior/Cognition: Lethargic/Drowsy Oral Cavity Assessment: Within Functional Limits Oral Care Completed by SLP: Recent completion by staff Oral Cavity - Dentition: Dentures, top;Dentures, bottom Vision:  (Unable to assess) Self-Feeding Abilities:  (Unable to assess) Patient Positioning: Upright in bed Baseline Vocal Quality: Not observed Volitional Cough: Other (Comment) (Not observed)    Oral/Motor/Sensory Function Overall Oral Motor/Sensory Function: Other (comment) (Not assessed)   Ice Chips Ice chips: Within functional limits   Thin Liquid Thin Liquid: Not tested    Nectar Thick Nectar Thick Liquid: Not  tested   Honey Thick Honey Thick Liquid: Not tested   Puree Puree: Impaired Presentation: Spoon Oral Phase  Impairments: Poor awareness of bolus   Solid   GO   Solid: Not tested        Aaron Edelman, Student SLP 11/22/2016,11:29 AM

## 2016-11-22 NOTE — ED Provider Notes (Signed)
[facility] DEPT Provider Note   CSN: [id number] Arrival date & time: 11/21/16  2313     History   Chief Complaint Chief Complaint  Patient presents with  . Altered Mental Status    HPI [name] is a 83 y.o. female.   Altered Mental Status      This is a 83 year old female who presents with altered mental status.  Very limited history available as patient is nonverbal.  She has a history of Alzheimer's dementia with behavior disturbance.  EMS was reportedly called out for confusion and low blood pressure.  EMS blood pressure normal.  On my evaluation, she is minimally responsive.  Per report from staff at nursing home, she normally is verbal and can transfer on her own.  No obvious trauma.  Level 5 caveat for altered mental status.  Past Medical History:  Diagnosis Date  . Abnormality of gait 03/15/2010  . Anxiety state, unspecified 03/15/2010  . Depression, major 06/15/2015  . Depressive disorder, not elsewhere classified 03/15/2010  . Dysphagia, unspecified(787.20) 03/15/2010  . Generalized anxiety disorder 06/23/2014  . Herpes zoster without mention of complication 9/32/3557  . Other alteration of consciousness 03/15/2010  . Pain in hand 08/13/12  . Personal history of fall 03/15/2010  . Senile osteoporosis 03/15/2010  . Unspecified constipation 03/15/2010  . Unspecified essential hypertension 03/15/2010  . Unspecified hearing loss 08/22/2011  . Unspecified urinary incontinence 03/15/2010  . Urinary incontinence 03/15/2010  . Vaginal prolapse 06/15/2015    Patient Active Problem List   Diagnosis Date Noted  . Intracranial hemorrhage ([place]) 11/22/2016  . Contracture of finger joint, right 11/17/2016  . Severe protein-calorie malnutrition ([address]) 09/18/2016  . Alzheimer's dementia with behavioral disturbance 09/18/2016  . Closed patellar sleeve fracture of right knee 03/17/2016  . Fall 03/17/2016  . Fracture of multiple ribs 03/17/2016  .  Vitamin B 12 deficiency 02/15/2016  . Senile dementia 12/31/2015  . Hypothyroidism 12/14/2015  . Rash 09/21/2015  . Urinary tract infection 09/14/2015  . Constipation 06/15/2015  . Urinary frequency 06/15/2015  . Depression, major 06/15/2015  . Vaginal prolapse 06/15/2015  . Edema 04/21/2015  . Hallucinations 12/22/2014  . Speech abnormality   . TIA (transient ischemic attack) 12/10/2014  . Generalized anxiety disorder 06/23/2014  . Hearing loss 06/03/2014  . Insomnia 12/16/2013  . Trigger finger, acquired 03/04/2013  . Pain in hand   . Essential hypertension 03/15/2010  . Urinary incontinence 03/15/2010    Past Surgical History:  Procedure Laterality Date  . INCISION / DRAINAGE HAND / FINGER Right 06/2008   hand  . SPINE SURGERY  12/2006    OB History    No data available       Home Medications    Prior to Admission medications   Medication Sig Start Date End Date Taking? Authorizing Provider  acetaminophen (TYLENOL) 325 MG tablet Take 2 tablets (650 mg total) by mouth every 6 (six) hours as needed for mild pain (or Fever >/= 101). 03/19/16  Yes Darrick Meigs, Marge Duncans, MD  acetaminophen (TYLENOL) 500 MG tablet Take 1,000 mg by mouth at bedtime.   Yes [provider]  aspirin EC 81 MG tablet Take 1 tablet (81 mg total) by mouth daily. 12/11/14  Yes Theodis Blaze, MD  Calcium Carbonate-Vitamin D (CALCIUM 600+D) 600-200 MG-UNIT TABS Take 1 tablet by mouth daily.    Yes [provider]  citalopram (CELEXA) 20 MG tablet Take 20 mg by mouth daily.   Yes [provider]  cyanocobalamin 1000 MCG tablet Take 1,000 mcg by mouth daily.    Yes [provider]  hydrocortisone (PROCTO-MED HC) 2.5 % rectal cream Place 1 application rectally every 12 (twelve) hours as needed for hemorrhoids or itching.   Yes [provider]  ibuprofen (ADVIL,MOTRIN) 200 MG tablet Take 400 mg by mouth every 6 (six) hours as needed (for pain).   Yes [provider]  levothyroxine (SYNTHROID, LEVOTHROID) 25 MCG tablet Take 25 mcg by mouth daily before breakfast.   Yes [provider]  metoprolol tartrate (LOPRESSOR) 25 MG tablet Take 12.5 mg by mouth at bedtime.   Yes [provider]  metoprolol tartrate (LOPRESSOR) 25 MG tablet Take 25 mg by mouth daily.   Yes [provider]  mirtazapine (REMERON) 15 MG tablet Take 15 mg by mouth at bedtime.   Yes [provider]  Nutritional Supplements (RESOURCE 2.0) LIQD Take 120 mLs by mouth 2 (two) times daily between meals. At 10AM and 4PM   Yes [provider]  polyethylene glycol (MIRALAX / GLYCOLAX) packet Take 17 g by mouth daily. 01/24/16  Yes Eber Jones, MD  risperiDONE (RISPERDAL) 0.5 MG tablet Take 0.25 mg by mouth daily. At [facility]   Yes [provider]  mineral oil-hydrophilic petrolatum (AQUAPHOR) ointment Apply 1 application topically daily as needed for dry skin.    [provider]    Family History Family History  Problem Relation Age of Onset  . Heart disease Father        MI    Social History Social History  Substance Use Topics  . Smoking status: Never Smoker  . Smokeless tobacco: Never Used  . Alcohol use Yes     Comment: 2-4 oz of wine once daily at request     Allergies   Ace inhibitors; Hydrochlorothiazide; and Morphine and related   Review of Systems Review of Systems  Unable to perform ROS: Mental status change     Physical Exam Updated Vital Signs BP (!) 105/55   Pulse (!) 33   Temp (!) 97.3 F (36.3 C) (Rectal) Comment: Warm blankets applied  Resp 15   SpO2 91%   Physical Exam  Constitutional:  Elderly, somnolent, minimally responsive but protecting airway  HENT:  Head: Normocephalic and atraumatic.  Eyes: Pupils are equal, round, and reactive to light.  Cardiovascular: Regular rhythm and normal heart sounds.   Bradycardia  Pulmonary/Chest: Effort normal and breath sounds  normal. No respiratory distress. She has no wheezes.  Abdominal: Soft. Bowel sounds are normal. There is no tenderness.  Neurological: GCS eye subscore is 2. GCS verbal subscore is 2. GCS motor subscore is 5.  Somnolent, minimally arousable, will not open eyes to voice but will to pain, patient localizes to pain and appears to move all 4 extremities  Skin: Skin is warm and dry.  Psychiatric: She has a normal mood and affect.  Nursing note and vitals reviewed.    ED Treatments / Results  Labs (all labs ordered are listed, but only abnormal results are displayed) Labs Reviewed  CBC WITH DIFFERENTIAL/PLATELET - Abnormal; Notable for the following:       Result Value   RBC 3.71 (*)    MCH 34.2 (*)    All other components within normal limits  COMPREHENSIVE METABOLIC PANEL - Abnormal; Notable for the following:    Chloride 98 (*)    Glucose, Bld 115 (*)    BUN 40 (*)  Total Bilirubin 1.5 (*)    GFR calc non Af Amer 50 (*)    GFR calc Af Amer 58 (*)    All other components within normal limits  URINALYSIS, ROUTINE W REFLEX MICROSCOPIC - Abnormal; Notable for the following:    Color, Urine AMBER (*)    APPearance CLOUDY (*)    Ketones, ur 20 (*)    Protein, ur 100 (*)    Leukocytes, UA TRACE (*)    Bacteria, UA FEW (*)    Squamous Epithelial / LPF 0-5 (*)    All other components within normal limits  CBG MONITORING, ED - Abnormal; Notable for the following:    Glucose-Capillary 124 (*)    All other components within normal limits  I-STAT CG4 LACTIC ACID, ED  I-STAT CG4 LACTIC ACID, ED    EKG  EKG Interpretation  Date/Time:  Wednesday November 22 2016 00:44:37 EDT Ventricular Rate:  46 PR Interval:    QRS Duration: 86 QT Interval:  559 QTC Calculation: 489 R Axis:   20 Text Interpretation:  Sinus bradycardia Probable anterior infarct, age indeterminate Rate slower Otherwise no significant change Confirmed by Thayer Jew [phone]) on 11/22/2016 12:50:59 AM        Radiology Ct Head Wo Contrast  Result Date: 11/22/2016 CLINICAL DATA:  Altered mental status EXAM: CT HEAD WITHOUT CONTRAST TECHNIQUE: Contiguous axial images were obtained from the base of the skull through the vertex without intravenous contrast. COMPARISON:  Head CT 03/17/2016 FINDINGS: Brain: There is acute hemorrhage within the right occipital lobe. The intraparenchymal hematoma measures 2.3 x 3.7 x 3.8 cm (volume = 17 cm^3). There is mild surrounding edema. Calcifications within the basal ganglia and along the surface of the left tentorium cerebelli are unchanged. There is diffuse atrophy and chronic white matter hypoattenuation consistent with chronic ischemic microangiopathy. No midline shift or other mass effect. No hydrocephalus. No extra-axial extension of hemorrhage. Vascular: Atherosclerotic calcification of the vertebral and internal carotid arteries at the skull base. Skull: Normal visualized skull base, calvarium and extracranial soft tissues. Sinuses/Orbits: No sinus fluid levels or advanced mucosal thickening. No mastoid effusion. Normal orbits. IMPRESSION: 1. Right occipital intraparenchymal hematoma measuring 17 mL without associated mass effect or hydrocephalus. The location in a patient of this age is suggestive of hemorrhage secondary to amyloid angiopathy. This could be further assessed with MRI and, if necessary. 2. Advanced atrophy and chronic ischemic microangiopathic changes. Critical Value/emergent results were called by telephone at the time of interpretation on 11/22/2016 at 2:14 am to Dr. Thayer Jew , who verbally acknowledged these results. Electronically Signed   By: Ulyses Jarred M.D.   On: 11/22/2016 02:14    Procedures Procedures (including critical care time)  CRITICAL CARE Performed by: Merryl Hacker   Total critical care time: 60 minutes  Critical care time was exclusive of separately billable procedures and treating other patients.  Critical  care was necessary to treat or prevent imminent or life-threatening deterioration.  Critical care was time spent personally by me on the following activities: development of treatment plan with patient and/or surrogate as well as nursing, discussions with consultants, evaluation of patient's response to treatment, examination of patient, obtaining history from patient or surrogate, ordering and performing treatments and interventions, ordering and review of laboratory studies, ordering and review of radiographic studies, pulse oximetry and re-evaluation of patient's condition.   Medications Ordered in ED Medications  nicardipine (CARDENE) 20mg  in 0.86% saline 250ml IV infusion (0.1 mg/ml) (not administered)  Initial Impression / Assessment and Plan / ED Course  I have reviewed the triage vital signs and the nursing notes.  Pertinent labs & imaging results that were available during my care of the patient were reviewed by me and considered in my medical decision making (see chart for details).     Presents with altered mental status.  Very limited history on exam.  Unclear timeframe.  GCS is 9.  Confirmed DNR.  She is not febrile.  Workup initiated including labs and CT scan.  CT obtained and shows an intracranial hemorrhage.  This was discussed with Dr. Leonel Ramsay.  Patient's daughter was informed, Hunt Oris.  Discussed options including management of blood pressure and ICU stay versus "full comfort care."This is likely a survivable injury but prognosis is unclear regarding functional status.  Daughter confirms DNR.  She would like her evaluated by neurology and will make a decision regarding comfort care after more prognostic information is available.  Patient was started on a nicardipine drip for blood pressure control.  Plan for admission to the ICU.  Final Clinical Impressions(s) / ED Diagnoses   Final diagnoses:  Somnolence  Intracranial hemorrhage [facility])    New  Prescriptions New Prescriptions   No medications on file     Merryl Hacker, MD 11/22/16 [phone]

## 2016-11-23 DIAGNOSIS — I680 Cerebral amyloid angiopathy: Secondary | ICD-10-CM

## 2016-11-23 LAB — COMPREHENSIVE METABOLIC PANEL
ALK PHOS: 73 U/L (ref 38–126)
ALT: 16 U/L (ref 14–54)
ANION GAP: 13 (ref 5–15)
AST: 20 U/L (ref 15–41)
Albumin: 3.3 g/dL — ABNORMAL LOW (ref 3.5–5.0)
BUN: 20 mg/dL (ref 6–20)
CALCIUM: 8.5 mg/dL — AB (ref 8.9–10.3)
CO2: 22 mmol/L (ref 22–32)
CREATININE: 0.71 mg/dL (ref 0.44–1.00)
Chloride: 104 mmol/L (ref 101–111)
Glucose, Bld: 71 mg/dL (ref 65–99)
Potassium: 3.2 mmol/L — ABNORMAL LOW (ref 3.5–5.1)
Sodium: 139 mmol/L (ref 135–145)
Total Bilirubin: 1 mg/dL (ref 0.3–1.2)
Total Protein: 6.1 g/dL — ABNORMAL LOW (ref 6.5–8.1)

## 2016-11-23 LAB — CBC
HEMATOCRIT: 36.3 % (ref 36.0–46.0)
Hemoglobin: 12 g/dL (ref 12.0–15.0)
MCH: 33.1 pg (ref 26.0–34.0)
MCHC: 33.1 g/dL (ref 30.0–36.0)
MCV: 100.3 fL — AB (ref 78.0–100.0)
Platelets: 329 10*3/uL (ref 150–400)
RBC: 3.62 MIL/uL — ABNORMAL LOW (ref 3.87–5.11)
RDW: 13.6 % (ref 11.5–15.5)
WBC: 8 10*3/uL (ref 4.0–10.5)

## 2016-11-23 MED ORDER — HALOPERIDOL LACTATE 5 MG/ML IJ SOLN
1.0000 mg | Freq: Once | INTRAMUSCULAR | Status: AC
  Administered 2016-11-23: 1 mg via INTRAVENOUS

## 2016-11-23 MED ORDER — LEVOTHYROXINE SODIUM 25 MCG PO TABS
25.0000 ug | ORAL_TABLET | Freq: Every day | ORAL | Status: DC
  Filled 2016-11-23: qty 1

## 2016-11-23 MED ORDER — MIRTAZAPINE 15 MG PO TABS
15.0000 mg | ORAL_TABLET | Freq: Every day | ORAL | Status: DC
  Administered 2016-11-23: 15 mg via ORAL
  Filled 2016-11-23: qty 1

## 2016-11-23 MED ORDER — METOPROLOL TARTRATE 25 MG PO TABS
25.0000 mg | ORAL_TABLET | Freq: Every day | ORAL | Status: DC
  Filled 2016-11-23 (×2): qty 1

## 2016-11-23 MED ORDER — CALCIUM CARBONATE-VITAMIN D 500-200 MG-UNIT PO TABS
1.0000 | ORAL_TABLET | Freq: Every day | ORAL | Status: DC
  Filled 2016-11-23 (×2): qty 1

## 2016-11-23 MED ORDER — CITALOPRAM HYDROBROMIDE 10 MG PO TABS
20.0000 mg | ORAL_TABLET | Freq: Every day | ORAL | Status: DC
  Filled 2016-11-23 (×2): qty 2

## 2016-11-23 MED ORDER — VITAMIN B-12 1000 MCG PO TABS
1000.0000 ug | ORAL_TABLET | Freq: Every day | ORAL | Status: DC
  Filled 2016-11-23 (×2): qty 1

## 2016-11-23 MED ORDER — HALOPERIDOL LACTATE 5 MG/ML IJ SOLN
INTRAMUSCULAR | Status: AC
  Filled 2016-11-23: qty 1

## 2016-11-23 MED ORDER — POLYETHYLENE GLYCOL 3350 17 G PO PACK
17.0000 g | PACK | Freq: Every day | ORAL | Status: DC
  Filled 2016-11-23: qty 1

## 2016-11-23 MED ORDER — ACETAMINOPHEN 500 MG PO TABS
1000.0000 mg | ORAL_TABLET | Freq: Every day | ORAL | Status: DC
Start: 2016-11-23 — End: 2016-11-25
  Administered 2016-11-23: 1000 mg via ORAL
  Filled 2016-11-23: qty 2

## 2016-11-23 MED ORDER — METOPROLOL TARTRATE 12.5 MG HALF TABLET
12.5000 mg | ORAL_TABLET | Freq: Every day | ORAL | Status: DC
  Administered 2016-11-23: 12.5 mg via ORAL
  Filled 2016-11-23: qty 1

## 2016-11-23 MED ORDER — ACETAMINOPHEN 325 MG PO TABS: 650.0000 mg | ORAL_TABLET | Freq: Four times a day (QID) | ORAL | Status: DC | PRN

## 2016-11-23 MED ORDER — RISPERIDONE 0.5 MG PO TABS
0.2500 mg | ORAL_TABLET | Freq: Every day | ORAL | Status: DC
  Filled 2016-11-23 (×2): qty 1

## 2016-11-23 MED ORDER — BOOST / RESOURCE BREEZE PO LIQD: 237.0000 mL | Freq: Two times a day (BID) | ORAL | Status: DC

## 2016-11-23 NOTE — Progress Notes (Signed)
[facility] STROKE TEAM PROGRESS NOTE   SUBJECTIVE (INTERVAL HISTORY) Pt appears improved today. She is sitting up in bed and is alert and interactive. . Not following any direct commands but will mimic some. Follow-up CT scan of the head shows stable appearance of the right parieto-occipital hemorrhage without significant change   OBJECTIVE  Recent Labs Lab 11/22/16 0046  GLUCAP 124*    Recent Labs Lab 11/22/16 0003 11/23/16 0518  NA 137 139  K 4.3 3.2*  CL 98* 104  CO2 26 22  GLUCOSE 115* 71  BUN 40* 20  CREATININE 0.93 0.71  CALCIUM 9.0 8.5*    Recent Labs Lab 11/22/16 0003 11/23/16 0518  AST 31 20  ALT 19 16  ALKPHOS 90 73  BILITOT 1.5* 1.0  PROT 7.4 6.1*  ALBUMIN 3.7 3.3*    Recent Labs Lab 11/22/16 0003 11/23/16 0518  WBC 9.9 8.0  NEUTROABS 7.3  --   HGB 12.7 12.0  HCT 37.0 36.3  MCV 99.7 100.3*  PLT 334 329   No results for input(s): CKTOTAL, CKMB, CKMBINDEX, TROPONINI in the last 168 hours. No results for input(s): LABPROT, INR in the last 72 hours.  Recent Labs  11/22/16 0003  COLORURINE AMBER*  LABSPEC 1.024  PHURINE 5.0  GLUCOSEU NEGATIVE  HGBUR NEGATIVE  BILIRUBINUR NEGATIVE  KETONESUR 20*  PROTEINUR 100*  NITRITE NEGATIVE  LEUKOCYTESUR TRACE*       Component Value Date/Time   CHOL 168 08/03/2016   TRIG 96 08/03/2016   HDL 56 08/03/2016   CHOLHDL 3.0 12/11/2014 0540   VLDL 21 12/11/2014 0540   LDLCALC 93 08/03/2016   Lab Results  Component Value Date   HGBA1C 5.5 07/06/2016   No results found for: LABOPIA, COCAINSCRNUR, LABBENZ, AMPHETMU, THCU, LABBARB  No results for input(s): ETH in the last 168 hours.  IMAGING: I have personally reviewed the radiological images below and agree with the radiology interpretations.  Ct Head Wo Contrast Result Date: 11/22/2016 IMPRESSION: 1. Right occipital intraparenchymal hematoma measuring 17 mL without associated mass effect or hydrocephalus. The location in a patient of this age is  suggestive of hemorrhage secondary to amyloid angiopathy. This could be further assessed with MRI and, if necessary. 2. Advanced atrophy and chronic ischemic microangiopathic changes.   11/23/2016 : 1. Stable acute hemorrhage in the right occipital lobe as well as associated edema and local mass effect. 2. No new acute intracranial abnormality identified. 3. Stable chronic microvascular ischemic changes and parenchymal volume loss of the brain. PHYSICAL EXAM Temp:  [97.5 F (36.4 C)-98.4 F (36.9 C)] 98.3 F (36.8 C) (11/01 1120) Pulse Rate:  [39-131] 69 (11/01 1200) Resp:  [12-38] 15 (11/01 1200) BP: (115-157)/(47-102) 129/65 (11/01 1200) SpO2:  [77 %-100 %] 95 % (11/01 1200)  General - Well nourished, well developed, in no apparent distress Respiratory - No labored breathing noted on exam Cardiovascular - Regular rate and rhythm   NEURO Baseline: Per record patient does not participate that much in conversation at baseline due to dementia, but does talk and answer questions.  She is wheelchair-bound. Feeds self per reports  Mental Status: Patient is awake, will open eyes to loud voice, she does not follow commands she will occasionally say one or two words Cranial Nerves: II: + blinks to threat bilaterally. Pupils are equal, round, and reactive to light. +/- tracking  III,IV, VI: EOMI without ptosis or diploplia.  V: Facial sensation is symmetric to temperature VII: Facial movement is symmetric.  VIII: hearing  is intact to voice, HOH X: Uvula elevates symmetrically XI: Shoulder shrug is symmetric. XII: tongue is midline without atrophy or fasciculations.  Motor: She keeps her right hand clenched into a fist, but does move her arm voluntarily, likely weak in RUE, withdraws all 3 other extremities. Increased tone throughout with paratonia and grasp reflex Sensory: Withdraws all 4 extremities to noxious stimuli  Cerebellar: She does not perform   ASSESSMENT AND PLAN: Ms.  DEMIKA LANGENDERFER is a 83 y.o. female with PMH of dementia admitted for ICH, Score of 2   Small posterior Right occipital intraparenchymal hematoma: Likely secondary to Amyloid Angiopathy   Resultant:  Depressed mentation Not following commands   VTE prophylaxis: SCD's   DIET DYS 3 Room service appropriate? Yes; Fluid consistency: Thin         Therapy recommendations:  PENDING       Disposition: PENDING           Recommendations:   Repeat Head CT to monitor intracranial swelling  Closely watch Neuro exam. Poor prognosis. Family may consider comfort care in AM if no signs of improvement.  Maintain IVF's at 50cc/hr  Check CBC and CMP in AM  Hypertension: Stable, no sedation or HTN meds in progress Continue to monitor closely  UTI:  Continue IV Rocephin for now  Dementia  Appears at baseline  Will continue to monitor closely  Hospital day # 1   Renie Ora Stroke Neurology Team 11/23/2016 1:32 PM I have personally examined this patient, reviewed notes, independently viewed imaging studies, participated in medical decision making and plan of care.ROS completed by me personally and pertinent positives fully documented  I have made any additions or clarifications directly to the above note. Agree with note above.  She has  poor neurological baseline from her advanced dementia and CT scan of the head shows a posterior right parieto-occipital hemorrhage likely from underlying amyloid angiopathy. I had a telephonic discussion with the patient's daughter who understands appropriate prognosis and agrees with DO NOT RESUSCITATE and does not want a feeding tube. She is likely to make the patient comfort care if her condition declines further but would like to watch her for 1-2 days. Maintain strict control of blood pressure and close neurological monitoring. Check swallow eval by speech therapy. Transfer to the neurology floor bed. Mobilize out of bed. May consider transfer  to skilled nursing facility over the next few days if stable family not available at the bedside for discussion. Greater than 50% time during this 35 minute visit was spent on counseling and coordination of care about her intracerebral hemorrhage, dementia and developing plan of care       Antony Contras, MD Medical Director [place] Pager: [phone] 11/23/2016 1:32 PM  To contact Stroke Continuity provider, please refer to [email or web address] After hours, contact General Neurology

## 2016-11-23 NOTE — Progress Notes (Signed)
SLP Cancellation Note  Patient Details Name: [name] MRN: [id number] DOB: 01/14/1918   Cancelled treatment:       Reason Eval/Treat Not Completed: Other (comment). Attempted to administer PO, but pt resistant to any interventions, would not accept sips of water and could not be redirected over several attempts. Despite this, recommend pt initiate a diet give resolution of lethargy and no evidence of neuromuscular impairment in oral movement. Speech quite intelligible though dementia impedes intervention. Would not want to withhold PO at this point so will initiate a mechanical soft diet with thin liquids. Will attempt to observe pt with POs tomorrow during meal.    Atwood Adcock, Katherene Ponto 11/23/2016, 2:35 PM

## 2016-11-23 NOTE — Clinical Social Work Note (Signed)
Clinical Social Work Assessment  Patient Details  Name: [name] MRN: [id number] Date of Birth: 1917-08-15  Date of referral:  11/23/16               Reason for consult:  Facility Placement (from [facility])                Permission sought to share information with:  Facility Sport and exercise psychologist, Family Supports Permission granted to share information::  No (patient disoriented)  Name::     Child psychotherapist::  [facility]  Relationship::  daughter  Contact Information:  [phone]  Housing/Transportation Living arrangements for the past 2 months:  [place] of Information:  Adult Children Patient Interpreter Needed:  None Criminal Activity/Legal Involvement Pertinent to Current Situation/Hospitalization:  No - Comment as needed Significant Relationships:  Adult Children Lives with:  Facility Resident Do you feel safe going back to the place where you live?  Yes Need for family participation in patient care:  Yes (Comment)  Care giving concerns: Patient from [facility] SNF.    Social Worker assessment / plan: CSW spoke to patient's daughter, [name] via phone. Dtr lives in [place]. CSW gave dtr update and dtr agreeable for patient to return to SNF when discharged; patient has lived there for several years. CSW left message for admissions at SNF. CSW to follow and support with discharge.  Employment status:  Retired Forensic scientist:  [employer] PT Recommendations:  [place] / Referral to community resources:  [place]  Patient/Family's Response to care: Daughter appreciative of care.  Patient/Family's Understanding of and Emotional Response to Diagnosis, Current Treatment, and Prognosis: Daughter with good understanding of patient's condition and hopeful for return to SNF when medically stable.  Emotional Assessment Appearance:  Appears stated  age Attitude/Demeanor/Rapport:  Unable to Assess Affect (typically observed):  Unable to Assess Orientation:  Oriented to Self Alcohol / Substance use:  Not Applicable Psych involvement (Current and /or in the community):  No (Comment)  Discharge Needs  Concerns to be addressed:  Discharge Planning Concerns Readmission within the last 30 days:  No Current discharge risk:  Physical Impairment, Cognitively Impaired Barriers to Discharge:  Continued Medical Work up   Estanislado Emms, LCSW 11/23/2016, 1:29 PM

## 2016-11-23 NOTE — [place] FL2 (Signed)
[place] MEDICAID FL2 LEVEL OF CARE SCREENING TOOL     IDENTIFICATION  Patient Name: [name] Birthdate: 1917-11-04 Sex: female Admission Date (Current Location): 11/21/2016  [facility] [place] Number:  Herbalist and Address:  [facility], [place] [address] [place], [place] [address]      Provider Number: [id number]  Attending Physician Name and Address:  Garvin Fila, MD  Relative Name and Phone Number:  Hunt Oris, daughter, [phone]    Current Level of Care: Hospital Recommended Level of Care: [place] Prior Approval Number:    Date Approved/Denied:   PASRR Number: [id number]  Discharge Plan: SNF    Current Diagnoses: Patient Active Problem List   Diagnosis Date Noted  . Intracranial hemorrhage ([place]) 11/22/2016  . ICH (intracerebral hemorrhage) ([place]) 11/22/2016  . Cytotoxic brain edema ([place]) 11/22/2016  . Cerebral amyloid angiopathy ([place]) 11/22/2016  . Contracture of finger joint, right 11/17/2016  . Severe protein-calorie malnutrition ([place]) 09/18/2016  . Alzheimer's dementia with behavioral disturbance 09/18/2016  . Closed patellar sleeve fracture of right knee 03/17/2016  . Fall 03/17/2016  . Fracture of multiple ribs 03/17/2016  . Vitamin B 12 deficiency 02/15/2016  . Senile dementia 12/31/2015  . Hypothyroidism 12/14/2015  . Rash 09/21/2015  . Urinary tract infection 09/14/2015  . Constipation 06/15/2015  . Urinary frequency 06/15/2015  . Depression, major 06/15/2015  . Vaginal prolapse 06/15/2015  . Edema 04/21/2015  . Hallucinations 12/22/2014  . Speech abnormality   . TIA (transient ischemic attack) 12/10/2014  . Generalized anxiety disorder 06/23/2014  . Hearing loss 06/03/2014  . Insomnia 12/16/2013  . Trigger finger, acquired 03/04/2013  . Pain in hand   . Essential hypertension 03/15/2010  . Urinary incontinence 03/15/2010    Orientation RESPIRATION BLADDER Height &  Weight     Self  Normal External catheter Weight:   Height:     BEHAVIORAL SYMPTOMS/MOOD NEUROLOGICAL BOWEL NUTRITION STATUS      Continent Diet (see DC summary for diet)  AMBULATORY STATUS COMMUNICATION OF NEEDS Skin   Extensive Assist Verbally Normal                       Personal Care Assistance Level of Assistance  Bathing, Feeding, Dressing Bathing Assistance: Limited assistance Feeding assistance: Limited assistance Dressing Assistance: Limited assistance     Functional Limitations Info  Sight, Hearing, Speech Sight Info: Adequate Hearing Info: Impaired Speech Info: Adequate    SPECIAL CARE FACTORS FREQUENCY  PT (By licensed PT), OT (By licensed OT)     PT Frequency: 5x/week OT Frequency: 5x/week            Contractures Contractures Info: Not present    Additional Factors Info  Code Status, Allergies Code Status Info: DNR Allergies Info: Ace Inhibitors, Hydrochlorothiazide, Morphine And Related           Current Medications (11/23/2016):  This is the current hospital active medication list Current Facility-Administered Medications  Medication Dose Route Frequency Provider Last Rate Last Dose  . 0.9 %  sodium chloride infusion   Intravenous Continuous Mary Sella, NP   Stopped at 11/23/16 1100  . acetaminophen (TYLENOL) tablet 650 mg  650 mg Oral Q4H PRN Greta Doom, MD       Or  . acetaminophen (TYLENOL) solution 650 mg  650 mg Per Tube Q4H PRN Greta Doom, MD       Or  . acetaminophen (TYLENOL)  suppository 650 mg  650 mg Rectal Q4H PRN Greta Doom, MD      . cefTRIAXone (ROCEPHIN) 1 g in dextrose 5 % 50 mL IVPB  1 g Intravenous Daily Greta Doom, MD   Stopped at 11/23/16 1108  . chlorhexidine (PERIDEX) 0.12 % solution 15 mL  15 mL Mouth Rinse BID Greta Doom, MD   15 mL at 11/22/16 2129  . MEDLINE mouth rinse  15 mL Mouth Rinse q12n4p Greta Doom, MD   15 mL at 11/22/16 1200  .  nicardipine (CARDENE) 20mg  in 0.86% saline 251ml IV infusion (0.1 mg/ml)  0-15 mg/hr Intravenous Continuous Horton, Barbette Hair, MD   Stopped at 11/23/16 1100  . pantoprazole (PROTONIX) injection 40 mg  40 mg Intravenous QHS Greta Doom, MD   40 mg at 11/22/16 2129     Discharge Medications: Please see discharge summary for a list of discharge medications.  Relevant Imaging Results:  Relevant Lab Results:   Additional Information SSN: [id number]  Estanislado Emms, LCSW  I have personally examined this patient, reviewed notes, independently viewed imaging studies, participated in medical decision making and plan of care.ROS completed by me personally and pertinent positives fully documented  I have made any additions or clarifications directly to the above note. Agree with note above.    Antony Contras, MD Medical Director [facility] Pager: [phone] 11/23/2016 1:57 PM

## 2016-11-23 NOTE — Progress Notes (Signed)
OT Cancellation Note  Patient Details Name: SHEQUILA NEGLIA MRN: [id number] DOB: 1917-02-13   Cancelled Treatment:    Reason Eval/Treat Not Completed: Patient not medically ready (active bedrest orders).  Binnie Kand M.S., OTR/L Pager: [phone]  11/23/2016, 7:04 AM

## 2016-11-23 NOTE — Evaluation (Signed)
Physical Therapy Evaluation/ Discharge Patient Details Name: [name] MRN: [id number] DOB: August 09, 1917 Today's Date: 11/23/2016   History of Present Illness  Pt is a 83 y.o. female with PMHx of Abnormality of gait, Anxiety, Depression, Dysphagia, Herpes zoster, Essential HTN, Hearing loss, Urinary incontinence, Vaginal prolapse presented with AMS. CT on 10/31 + for acute hemorrhage in the R occipital lobe.  Clinical Impression  Pt confused stating she doesn't want a bath. Pt moves well with limited assist but very HOH and unable to get pt to answer any questions. Pt able to eventually follow visual cues on her own time and volition. From chart review pt needs assist and functions at W/C level. Pt most likely at baseline and will defer all further needs to SNF. No further therapy needs and recommend daily mobility with nursing assist.     Follow Up Recommendations SNF;Supervision/Assistance - 24 hour    Equipment Recommendations  None recommended by PT    Recommendations for Other Services       Precautions / Restrictions Precautions Precautions: Fall Restrictions Weight Bearing Restrictions: No      Mobility  Bed Mobility Overal bed mobility: Needs Assistance Bed Mobility: Supine to Sit     Supine to sit: Min assist     General bed mobility comments: pt able to transition from supine to sit with assist only for safety  Transfers Overall transfer level: Needs assistance Equipment used: 1 person hand held assist Transfers: Sit to/from Stand;Stand Pivot Transfers Sit to Stand: Min assist Stand pivot transfers: Min assist       General transfer comment: assist for balance and safety with multimodal cues and visual reference. pt moves volitionally but not to command  Ambulation/Gait             General Gait Details: did not attempt due to cognition   Stairs            Wheelchair Mobility    Modified Rankin (Stroke Patients Only)       Balance  Overall balance assessment: Needs assistance Sitting-balance support: Feet supported;No upper extremity supported Sitting balance-Leahy Scale: Fair     Standing balance support: Bilateral upper extremity supported Standing balance-Leahy Scale: Fair                               Pertinent Vitals/Pain Pain Assessment:  (PAINAD= 0) Faces Pain Scale: No hurt    Home Living Family/patient expects to be discharged to:: Skilled nursing facility                 Additional Comments: [facility]    Prior Function Level of Independence: Needs assistance   Gait / Transfers Assistance Needed: per chart review pt is at w/c level  ADL's / Homemaking Assistance Needed: per chart review pt dependent with ADL, does self feed with set up        Hand Dominance        Extremity/Trunk Assessment   Upper Extremity Assessment Upper Extremity Assessment: Generalized weakness    Lower Extremity Assessment Lower Extremity Assessment: Generalized weakness    Cervical / Trunk Assessment Cervical / Trunk Assessment: Kyphotic  Communication   Communication: HOH  Cognition Arousal/Alertness: Awake/alert Behavior During Therapy: Restless;Impulsive Overall Cognitive Status: No family/caregiver present to determine baseline cognitive functioning  General Comments: Known hx of dementia. Difficult to assess due to [place] Comments General comments (skin integrity, edema, etc.): HR up to 150 bpm with bed mobility    Exercises     Assessment/Plan    PT Assessment All further PT needs can be met in the next venue of care  PT Problem List Decreased strength;Decreased mobility;Decreased safety awareness;Decreased activity tolerance;Decreased balance;Decreased cognition       PT Treatment Interventions      PT Goals (Current goals can be found in the Care Plan section)  Acute Rehab PT Goals Patient Stated Goal:  none stated PT Goal Formulation: Patient unable to participate in goal setting    Frequency     Barriers to discharge        Co-evaluation PT/OT/SLP Co-Evaluation/Treatment: Yes Reason for Co-Treatment: Complexity of the patient's impairments (multi-system involvement);For patient/therapist safety PT goals addressed during session: Mobility/safety with mobility;Balance OT goals addressed during session: ADL's and self-care       AM-PAC PT "6 Clicks" Daily Activity  Outcome Measure Difficulty turning over in bed (including adjusting bedclothes, sheets and blankets)?: None Difficulty moving from lying on back to sitting on the side of the bed? : A Little Difficulty sitting down on and standing up from a chair with arms (e.g., wheelchair, bedside commode, etc,.)?: A Little Help needed moving to and from a bed to chair (including a wheelchair)?: A Little Help needed walking in hospital room?: A Little Help needed climbing 3-5 steps with a railing? : Total 6 Click Score: 17    End of Session   Activity Tolerance: Patient tolerated treatment well Patient left: in chair;with chair alarm set;with call bell/phone within Gonzalez;with restraints reapplied Nurse Communication: Mobility status PT Visit Diagnosis: Other abnormalities of gait and mobility (R26.89);Muscle weakness (generalized) (M62.81)    Time: 9509-3267 PT Time Calculation (min) (ACUTE ONLY): 14 min   Charges:   PT Evaluation $PT Eval Moderate Complexity: 1 Mod     PT G Codes:        [name], PT [phone]   [name] 11/23/2016, 10:46 AM

## 2016-11-23 NOTE — Progress Notes (Signed)
PT Cancellation Note  Patient Details Name: [name] MRN: [id number] DOB: 1917-02-11   Cancelled Treatment:    Reason Eval/Treat Not Completed: Medical issues which prohibited therapy (pt remains on bedrest and await increased activity order)   Jaser Fullen B [name] 11/23/2016, 7:12 AM Elwyn Reach, [place]

## 2016-11-23 NOTE — Evaluation (Signed)
Occupational Therapy Evaluation and Discharge Patient Details Name: [name] MRN: [id number] DOB: 06/04/1917 Today's Date: 11/23/2016    History of Present Illness Pt is a 83 y.o. female with PMHx of Abnormality of gait, Anxiety, Depression, Dysphagia, Herpes zoster, Essential HTN, Hearing loss, Urinary incontinence, Vaginal prolapse presented with AMS. CT on 10/31 + for acute hemorrhage in the R occipital lobe.   Clinical Impression   Per chart review; pt dependent with ADL PTA; was self feeding with set up. Currently pt requires min assist for stand pivot transfers and max assist overall for ADL. Recommending return to SNF upon d/c. All further OT needs can be met at the next venue of care; will sign off from OT stand point at this time. Please re-consult if needs change. Thank you for this referral.    Follow Up Recommendations  SNF;Supervision/Assistance - 24 hour    Equipment Recommendations  Other (comment) (TBD at next venue)    Recommendations for Other Services       Precautions / Restrictions Precautions Precautions: Fall Restrictions Weight Bearing Restrictions: No      Mobility Bed Mobility Overal bed mobility: Needs Assistance Bed Mobility: Supine to Sit     Supine to sit: Min assist     General bed mobility comments: Pt initiating movement toward EOB. Light min assist for balance and safety.  Transfers Overall transfer level: Needs assistance Equipment used: 1 person hand held assist Transfers: Sit to/from [facility] Sit to Stand: Min assist Stand pivot transfers: Min assist       General transfer comment: For balance and safety. Multimodal cues to initiate transfer EOB > chair.    Balance Overall balance assessment: Needs assistance Sitting-balance support: Feet supported;No upper extremity supported Sitting balance-Leahy Scale: Fair     Standing balance support: Bilateral upper extremity supported Standing balance-Leahy  Scale: Poor                             ADL either performed or assessed with clinical judgement   ADL Overall ADL's : Needs assistance/impaired Eating/Feeding: Maximal assistance;Sitting   Grooming: Maximal assistance;Sitting   Upper Body Bathing: Maximal assistance;Sitting   Lower Body Bathing: Maximal assistance;Sit to/from stand   Upper Body Dressing : Maximal assistance;Sitting Upper Body Dressing Details (indicate cue type and reason): to doff/don gown Lower Body Dressing: Maximal assistance;Sit to/from stand Lower Body Dressing Details (indicate cue type and reason): to don socks Toilet Transfer: Minimal assistance;Stand-pivot Toilet Transfer Details (indicate cue type and reason): simulated by stand pivot EOB to chair         Functional mobility during ADLs: Minimal assistance (for stand pivot only)       Vision   Additional Comments: Difficult to assess due to impaired cogntion and communication. Pt appears to be staring off into corner of room when talking with therapist.     Perception     Praxis      Pertinent Vitals/Pain Pain Assessment: Faces Faces Pain Scale: No hurt     Hand Dominance     Extremity/Trunk Assessment Upper Extremity Assessment Upper Extremity Assessment: Generalized weakness   Lower Extremity Assessment Lower Extremity Assessment: Defer to PT evaluation   Cervical / Trunk Assessment Cervical / Trunk Assessment: Kyphotic   Communication Communication Communication: HOH   Cognition Arousal/Alertness: Awake/alert Behavior During Therapy: Restless;Impulsive Overall Cognitive Status: No family/caregiver present to determine baseline cognitive functioning  General Comments: Known hx of dementia. Difficult to assess due to [place] Comments  HR up to 150 bpm with bed mobility    Exercises     Shoulder Instructions      Home Living Family/patient expects to be  discharged to:: Skilled nursing facility                                 Additional Comments: [facility]      Prior Functioning/Environment Level of Independence: Needs assistance  Gait / Transfers Assistance Needed: per chart review pt is at w/c level ADL's / Homemaking Assistance Needed: per chart review pt dependent with ADL, does self feed with set up            OT Problem List:        OT Treatment/Interventions:      OT Goals(Current goals can be found in the care plan section) Acute Rehab OT Goals Patient Stated Goal: none stated OT Goal Formulation: All assessment and education complete, DC therapy  OT Frequency:     Barriers to D/C:            Co-evaluation PT/OT/SLP Co-Evaluation/Treatment: Yes Reason for Co-Treatment: Complexity of the patient's impairments (multi-system involvement);Necessary to address cognition/behavior during functional activity;To address functional/ADL transfers   OT goals addressed during session: ADL's and self-care      AM-PAC PT "6 Clicks" Daily Activity     Outcome Measure Help from another person eating meals?: A Lot Help from another person taking care of personal grooming?: A Lot Help from another person toileting, which includes using toliet, bedpan, or urinal?: A Lot Help from another person bathing (including washing, rinsing, drying)?: A Lot Help from another person to put on and taking off regular upper body clothing?: A Lot Help from another person to put on and taking off regular lower body clothing?: A Lot 6 Click Score: 12   End of Session Nurse Communication: Mobility status;Other (comment) (removed purewick)  Activity Tolerance: Patient tolerated treatment well Patient left: in chair;with call bell/phone within reach;with chair alarm set;with restraints reapplied  OT Visit Diagnosis: Unsteadiness on feet (R26.81);Other abnormalities of gait and mobility (R26.89);Muscle weakness (generalized)  (M62.81)                Time: 1937-9024 OT Time Calculation (min): 15 min Charges:  OT General Charges $OT Visit: 1 Visit OT Evaluation $OT Eval Moderate Complexity: 1 Mod G-Codes:     Adlai Nieblas A. Ulice Brilliant, M.S., OTR/L Pager: [place] 11/23/2016, 10:36 AM

## 2016-11-24 DIAGNOSIS — G936 Cerebral edema: Secondary | ICD-10-CM

## 2016-11-24 MED ORDER — PANTOPRAZOLE SODIUM 40 MG PO TBEC: 40.0000 mg | DELAYED_RELEASE_TABLET | Freq: Every day | ORAL | Status: DC

## 2016-11-24 MED ORDER — LORAZEPAM 2 MG/ML IJ SOLN
1.0000 mg | Freq: Once | INTRAMUSCULAR | Status: AC
  Administered 2016-11-24: 1 mg via INTRAVENOUS

## 2016-11-24 MED ORDER — LORAZEPAM 2 MG/ML IJ SOLN
INTRAMUSCULAR | Status: AC
  Filled 2016-11-24: qty 1

## 2016-11-24 MED ORDER — HALOPERIDOL LACTATE 5 MG/ML IJ SOLN
2.5000 mg | Freq: Once | INTRAMUSCULAR | Status: AC
  Administered 2016-11-24: 2.5 mg via INTRAVENOUS
  Filled 2016-11-24: qty 1

## 2016-11-24 MED ORDER — HALOPERIDOL LACTATE 5 MG/ML IJ SOLN
INTRAMUSCULAR | Status: AC
  Administered 2016-11-24: 2.5 mg via INTRAVENOUS
  Filled 2016-11-24: qty 1

## 2016-11-24 MED ORDER — HALOPERIDOL LACTATE 5 MG/ML IJ SOLN: 2.5000 mg | Freq: Once | INTRAMUSCULAR | Status: DC

## 2016-11-24 NOTE — Progress Notes (Signed)
SLP Cancellation Note  Patient Details Name: [name] MRN: [id number] DOB: Feb 19, 1917   Cancelled treatment:       Reason Eval/Treat Not Completed: Fatigue/lethargy limiting ability to participate.    Kaylee Wombles, Katherene Ponto 11/24/2016, 11:18 AM

## 2016-11-24 NOTE — Discharge Summary (Signed)
Stroke Discharge Summary  Patient ID: ryn peine   MRN: [id number]      DOB: Jan 30, 1917  Date of Admission: 11/21/2016 Date of Discharge: 11/24/2016  Attending Physician:  Garvin Fila, MD, Stroke MD Consultant(s):     Patient's PCP:  Blanchie Serve, MD  DISCHARGE DIAGNOSIS:  Active Problems:   Intracranial hemorrhage ([place])   ICH (intracerebral hemorrhage) ([place])   Cytotoxic brain edema ([place])   Cerebral amyloid angiopathy (CODE) UTI  Past Medical History:  Diagnosis Date  . Abnormality of gait 03/15/2010  . Anxiety state, unspecified 03/15/2010  . Depression, major 06/15/2015  . Depressive disorder, not elsewhere classified 03/15/2010  . Dysphagia, unspecified(787.20) 03/15/2010  . Generalized anxiety disorder 06/23/2014  . Herpes zoster without mention of complication 07/22/4763  . Other alteration of consciousness 03/15/2010  . Pain in hand 08/13/12  . Personal history of fall 03/15/2010  . Senile osteoporosis 03/15/2010  . Unspecified constipation 03/15/2010  . Unspecified essential hypertension 03/15/2010  . Unspecified hearing loss 08/22/2011  . Unspecified urinary incontinence 03/15/2010  . Urinary incontinence 03/15/2010  . Vaginal prolapse 06/15/2015   Past Surgical History:  Procedure Laterality Date  . INCISION / DRAINAGE HAND / FINGER Right 06/2008   hand  . SPINE SURGERY  12/2006    Allergies as of 11/24/2016      Reactions   Ace Inhibitors Other (See Comments)   Reaction unknown/ listed on MAR   Hydrochlorothiazide Other (See Comments)   Reaction unknown/ listed on MAR   Morphine And Related Other (See Comments)   Hallucinations      Medication List    STOP taking these medications   aspirin EC 81 MG tablet     TAKE these medications   acetaminophen 500 MG tablet Commonly known as:  TYLENOL Take 1,000 mg by mouth at bedtime.   acetaminophen 325 MG tablet Commonly known as:  TYLENOL Take 2 tablets (650 mg total) by mouth every 6 (six) hours as  needed for mild pain (or Fever >/= 101).   CALCIUM 600+D 600-200 MG-UNIT Tabs Generic drug:  Calcium Carbonate-Vitamin D Take 1 tablet by mouth daily.   citalopram 20 MG tablet Commonly known as:  CELEXA Take 20 mg by mouth daily.   cyanocobalamin 1000 MCG tablet Take 1,000 mcg by mouth daily.   ibuprofen 200 MG tablet Commonly known as:  ADVIL,MOTRIN Take 400 mg by mouth every 6 (six) hours as needed (for pain).   levothyroxine 25 MCG tablet Commonly known as:  SYNTHROID, LEVOTHROID Take 25 mcg by mouth daily before breakfast.   metoprolol tartrate 25 MG tablet Commonly known as:  LOPRESSOR Take 12.5 mg by mouth at bedtime.   metoprolol tartrate 25 MG tablet Commonly known as:  LOPRESSOR Take 25 mg by mouth daily.   mineral oil-hydrophilic petrolatum ointment Apply 1 application topically daily as needed for dry skin.   mirtazapine 15 MG tablet Commonly known as:  REMERON Take 15 mg by mouth at bedtime.   polyethylene glycol packet Commonly known as:  MIRALAX / GLYCOLAX Take 17 g by mouth daily.   PROCTO-MED HC 2.5 % rectal cream Generic drug:  hydrocortisone Place 1 application rectally every 12 (twelve) hours as needed for hemorrhoids or itching.   RESOURCE 2.0 Liqd Take 120 mLs by mouth 2 (two) times daily between meals. At 10AM and 4PM   risperiDONE 0.5 MG tablet Commonly known as:  RISPERDAL Take 0.25 mg by mouth daily. At [facility]  LABORATORY STUDIES CBC    Component Value Date/Time   WBC 8.0 11/23/2016 0518   RBC 3.62 (L) 11/23/2016 0518   HGB 12.0 11/23/2016 0518   HCT 36.3 11/23/2016 0518   PLT 329 11/23/2016 0518   MCV 100.3 (H) 11/23/2016 0518   MCV 97.9 (A) 12/07/2014 1707   MCH 33.1 11/23/2016 0518   MCHC 33.1 11/23/2016 0518   RDW 13.6 11/23/2016 0518   LYMPHSABS 1.5 11/22/2016 0003   MONOABS 1.0 11/22/2016 0003   EOSABS 0.0 11/22/2016 0003   BASOSABS 0.0 11/22/2016 0003   CMP    Component Value Date/Time   NA 139 11/23/2016  0518   NA 141 07/03/2016   K 3.2 (L) 11/23/2016 0518   CL 104 11/23/2016 0518   CO2 22 11/23/2016 0518   GLUCOSE 71 11/23/2016 0518   BUN 20 11/23/2016 0518   BUN 22 (A) 07/03/2016   CREATININE 0.71 11/23/2016 0518   CREATININE 0.69 12/07/2014 1759   CALCIUM 8.5 (L) 11/23/2016 0518   PROT 6.1 (L) 11/23/2016 0518   ALBUMIN 3.3 (L) 11/23/2016 0518   AST 20 11/23/2016 0518   ALT 16 11/23/2016 0518   ALKPHOS 73 11/23/2016 0518   BILITOT 1.0 11/23/2016 0518   GFRNONAA >60 11/23/2016 0518   GFRNONAA 74 12/07/2014 1759   GFRAA >60 11/23/2016 0518   GFRAA 86 12/07/2014 1759   COAGS Lab Results  Component Value Date   INR 0.94 03/17/2016   INR 1.01 12/10/2014   INR 0.91 07/16/2009   Lipid Panel    Component Value Date/Time   CHOL 168 08/03/2016   TRIG 96 08/03/2016   HDL 56 08/03/2016   CHOLHDL 3.0 12/11/2014 0540   VLDL 21 12/11/2014 0540   LDLCALC 93 08/03/2016   HgbA1C  Lab Results  Component Value Date   HGBA1C 5.5 07/06/2016   Urinalysis    Component Value Date/Time   COLORURINE AMBER (A) 11/22/2016 0003   APPEARANCEUR CLOUDY (A) 11/22/2016 0003   LABSPEC 1.024 11/22/2016 0003   PHURINE 5.0 11/22/2016 0003   GLUCOSEU NEGATIVE 11/22/2016 0003   HGBUR NEGATIVE 11/22/2016 0003   BILIRUBINUR NEGATIVE 11/22/2016 0003   BILIRUBINUR negative 12/07/2014 1706   KETONESUR 20 (A) 11/22/2016 0003   PROTEINUR 100 (A) 11/22/2016 0003   UROBILINOGEN 1.0 12/07/2014 1706   UROBILINOGEN 0.2 01/04/2010 2342   NITRITE NEGATIVE 11/22/2016 0003   LEUKOCYTESUR TRACE (A) 11/22/2016 0003   Urine Drug Screen No results found for: LABOPIA, COCAINSCRNUR, LABBENZ, AMPHETMU, THCU, LABBARB  Alcohol Level No results found for: [facility]  Ct Head Wo Contrast Result Date: 11/22/2016 IMPRESSION: 1. Right occipital intraparenchymal hematoma measuring 17 mL without associated mass effect or hydrocephalus. The location in a patient of this age is suggestive of hemorrhage secondary to amyloid  angiopathy. This could be further assessed with MRI and, if necessary. 2. Advanced atrophy and chronic ischemic microangiopathic changes.  11/23/2016 : 1. Stable acute hemorrhage in the right occipital lobe as well as associated edema and local mass effect. 2. No new acute intracranial abnormality identified. 3. Stable chronic microvascular ischemic changes and parenchymal volume loss of the brain.   ECHO: Study Conclusions - Left ventricle: The cavity size was normal. Wall thickness was normal. Systolic function was vigorous. The estimated ejection fraction was in the range of 65% to 70%. Doppler parameters are consistent with abnormal left ventricular relaxation (grade 1 diastolic dysfunction). - Left atrium: The atrium was moderately dilated. Impressions: - Poor acoustic windows limit study APical windows are  foreshortened    HISTORY OF [place] Ms.Eloisa Chokshi Youngis a 83 y.o.femalewith PMH of dementia admitted for ICH, Score of 2 Small posterior Right occipital intraparenchymal hematoma: Likely secondary to Amyloid Angiopathy Resultant:Depressed mentation, Not following commands,   She has poor neurological baseline from her advanced dementia and CT scan of the head shows a posterior right parieto-occipital hemorrhage likely from underlying amyloid angiopathy. Discussion with the patient's daughter who understands appropriate prognosis and agrees with DO NOT RESUSCITATE and does not want a feeding tube. She is likely to make the patient comfort care if her condition declines further but would like to watch her for 1-2 days. She was treated for a UTI in the hospital with Rocephin. Discontinued at discharge.  DISCHARGE EXAM Blood pressure (!) 157/51, pulse (!) 50, temperature 97.9 F (36.6 C), temperature source Oral, resp. rate 14, SpO2 97 %.   General- Well nourished, well developed, in no apparent distress Respiratory- No labored  breathing noted on exam Cardiovascular- Regular rate and rhythm   NEURO Baseline: Per record patient does not participate that much in conversation at baseline due to dementia, but does talk and answer questions. She is wheelchair-bound. Feeds self per reports  Mental Status: Patient is awake, will open eyes to loud voice, she does not follow commands she will occasionally say one or two words Cranial Nerves: II: + blinks to threat bilaterally. Pupils are equal, round, and reactive to light. +/- tracking III,IV, VI: EOMI without ptosis or diploplia.  V: Facial sensation is symmetric to temperature VII: Facial movement is symmetric.  VIII: hearing is intact to voice, HOH X: Uvula elevates symmetrically XI: Shoulder shrug is symmetric. XII: tongue is midline without atrophy or fasciculations.  Motor: She keeps her right hand clenched into a fist, but does move her arm voluntarily, likely weak in RUE, withdraws all 3 other extremities. Increased tone throughout with paratonia and grasp reflex Sensory: Withdraws all 4extremities to noxious stimuli  Cerebellar: She does not perform   Discharge Diet   DIET DYS 3 Room service appropriate? Yes; Fluid consistency: Thin Diet - low sodium heart healthy liquids  DISCHARGE PLAN  Disposition:  SNF  No antithrombotic for secondary stroke prevention. Do not restart ASA at this time due to [place]  Ongoing risk factor control by Primary Care Physician at time of discharge  Follow-up Blanchie Serve, MD in 2 weeks.  Follow-up with Dr Antony Contras, Stroke Clinic in 6 weeks, office to schedule an appointment.  Greater than  30 minutes were spent preparing discharge.  [facility] Team  I have personally examined this patient, reviewed notes, independently viewed imaging studies, participated in medical decision making and plan of care.ROS completed by me personally and pertinent positives fully documented  I have  made any additions or clarifications directly to the above note. Agree with note above.    Antony Contras, MD Medical Director [facility] Pager: [phone] 11/24/2016 1:56 PM

## 2016-11-24 NOTE — NC FL2 (Signed)
[place] MEDICAID FL2 LEVEL OF CARE SCREENING TOOL     IDENTIFICATION  Patient Name: [name] Birthdate: 09/10/1917 Sex: female Admission Date (Current Location): 11/21/2016  [facility] and [place] Number:  Herbalist and Address:  The . [facility], [place] [address], [place], [place] [address]      Provider Number: [id number]  Attending Physician Name and Address:  Garvin Fila, MD  Relative Name and Phone Number:  Hunt Oris, daughter - [phone]    Current Level of Care: Hospital Recommended Level of Care: [place] Prior Approval Number:    Date Approved/Denied:   PASRR Number: [id number]  Discharge Plan: SNF    Current Diagnoses: Patient Active Problem List   Diagnosis Date Noted  . Intracranial hemorrhage ([place]) 11/22/2016  . ICH (intracerebral hemorrhage) ([place]) 11/22/2016  . Cytotoxic brain edema ([place]) 11/22/2016  . Cerebral amyloid angiopathy (CODE) 11/22/2016  . Contracture of finger joint, right 11/17/2016  . Severe protein-calorie malnutrition ([place]) 09/18/2016  . Alzheimer's dementia with behavioral disturbance 09/18/2016  . Closed patellar sleeve fracture of right knee 03/17/2016  . Fall 03/17/2016  . Fracture of multiple ribs 03/17/2016  . Vitamin B 12 deficiency 02/15/2016  . Senile dementia 12/31/2015  . Hypothyroidism 12/14/2015  . Rash 09/21/2015  . Urinary tract infection 09/14/2015  . Constipation 06/15/2015  . Urinary frequency 06/15/2015  . Depression, major 06/15/2015  . Vaginal prolapse 06/15/2015  . Edema 04/21/2015  . Hallucinations 12/22/2014  . Speech abnormality   . TIA (transient ischemic attack) 12/10/2014  . Generalized anxiety disorder 06/23/2014  . Hearing loss 06/03/2014  . Insomnia 12/16/2013  . Trigger finger, acquired 03/04/2013  . Pain in hand   . Essential hypertension 03/15/2010  . Urinary incontinence 03/15/2010    Orientation RESPIRATION BLADDER Height &  Weight     Self  Normal External catheter Weight:   Height:     BEHAVIORAL SYMPTOMS/MOOD NEUROLOGICAL BOWEL NUTRITION STATUS      Continent Diet (See discharge summary for diet)  AMBULATORY STATUS COMMUNICATION OF NEEDS Skin   Limited Assist Verbally Normal                       Personal Care Assistance Level of Assistance  Bathing, Feeding, Dressing Bathing Assistance: Limited assistance Feeding assistance: Limited assistance Dressing Assistance: Limited assistance     Functional Limitations Info  Sight, Hearing, Speech Sight Info: Adequate Hearing Info: Impaired Speech Info: Adequate    SPECIAL CARE FACTORS FREQUENCY  PT (By licensed PT), OT (By licensed OT), Speech therapy     PT Frequency: Screen for Part B OT Frequency: Screen for Part B     Speech Therapy Frequency: Screen for Part B      Contractures Contractures Info: Not present    Additional Factors Info  Code Status Code Status Info: DNR Allergies Info: Ace Inhibitors, Hydrochlorothiazide, Morphine and Related           Current Medications (11/24/2016):  This is the current hospital active medication list Current Facility-Administered Medications  Medication Dose Route Frequency Provider Last Rate Last Dose  . acetaminophen (TYLENOL) tablet 650 mg  650 mg Oral Q4H PRN Greta Doom, MD       Or  . acetaminophen (TYLENOL) solution 650 mg  650 mg Per Tube Q4H PRN Greta Doom, MD       Or  . acetaminophen (TYLENOL) suppository 650 mg  650 mg Rectal Q4H PRN  Greta Doom, MD      . acetaminophen (TYLENOL) tablet 1,000 mg  1,000 mg Oral QHS Garvin Fila, MD   1,000 mg at 11/23/16 2259  . acetaminophen (TYLENOL) tablet 650 mg  650 mg Oral Q6H PRN Garvin Fila, MD      . calcium-vitamin D (OSCAL WITH D) 500-200 MG-UNIT per tablet 1 tablet  1 tablet Oral Daily Antony Contras S, MD      . cefTRIAXone (ROCEPHIN) 1 g in dextrose 5 % 50 mL IVPB  1 g Intravenous Daily  Greta Doom, MD 100 mL/hr at 11/24/16 1319 1 g at 11/24/16 1319  . chlorhexidine (PERIDEX) 0.12 % solution 15 mL  15 mL Mouth Rinse BID Greta Doom, MD   15 mL at 11/24/16 1322  . citalopram (CELEXA) tablet 20 mg  20 mg Oral Daily Faatimah Spielberg S, MD      . feeding supplement (BOOST / RESOURCE BREEZE) liquid 1 Container  237 mL Oral BID BM Antony Contras S, MD      . haloperidol lactate (HALDOL) injection 2.5 mg  2.5 mg Intravenous Once Greta Doom, MD      . levothyroxine (SYNTHROID, LEVOTHROID) tablet 25 mcg  25 mcg Oral QAC breakfast Garvin Fila, MD      . MEDLINE mouth rinse  15 mL Mouth Rinse q12n4p Greta Doom, MD   15 mL at 11/22/16 1200  . metoprolol tartrate (LOPRESSOR) tablet 12.5 mg  12.5 mg Oral QHS Garvin Fila, MD   12.5 mg at 11/23/16 2259  . metoprolol tartrate (LOPRESSOR) tablet 25 mg  25 mg Oral Daily Garvin Fila, MD      . mirtazapine (REMERON) tablet 15 mg  15 mg Oral QHS Garvin Fila, MD   15 mg at 11/23/16 2300  . nicardipine (CARDENE) 20mg  in 0.86% saline 248ml IV infusion (0.1 mg/ml)  0-15 mg/hr Intravenous Continuous Horton, Barbette Hair, MD   Stopped at 11/23/16 1100  . pantoprazole (PROTONIX) EC tablet 40 mg  40 mg Oral QHS Antony Contras S, MD      . polyethylene glycol (MIRALAX / GLYCOLAX) packet 17 g  17 g Oral Daily Antony Contras S, MD      . risperiDONE (RISPERDAL) tablet 0.25 mg  0.25 mg Oral Daily Garvin Fila, MD      . vitamin B-12 (CYANOCOBALAMIN) tablet 1,000 mcg  1,000 mcg Oral Daily Garvin Fila, MD         Discharge Medications: Please see discharge summary for a list of discharge medications.  Relevant Imaging Results:  Relevant Lab Results:   Additional Information ss#[id number]  Sable Feil, LCSW  I have personally examined this patient, reviewed notes, independently viewed imaging studies, participated in medical decision making and plan of care.ROS completed by me  personally and pertinent positives fully documented  I have made any additions or clarifications directly to the above note. Agree with note above.   Antony Contras, MD Medical Director [facility] Pager: [phone] 11/24/2016 1:57 PM

## 2016-11-24 NOTE — Care Management Note (Addendum)
Case Management Note  Patient Details  Name: ALAIJAH GIBLER MRN: [id number] Date of Birth: [birth year]  Subjective/Objective:                    Action/Plan: Plan is for SNF when medically ready. Pt is from [facility]. CM following.  1330: Patient discharged. CSW aware. Patient to return to [facility].   Expected Discharge Date:                  Expected Discharge Plan:  [place]  In-House Referral:  Clinical Social Work  Discharge planning Services  CM Consult  Post Acute Care Choice:    Choice offered to:     DME Arranged:    DME Agency:     HH Arranged:    [place] Agency:     Status of Service:  In process, will continue to follow  If discussed at Long Length of Stay Meetings, dates discussed:    Additional Comments:  Pollie Friar, RN 11/24/2016, 12:16 PM

## 2016-11-24 NOTE — Progress Notes (Signed)
Patient transferred for 4N around [place] she is alert and oriented to self only is very hard of hearing does not have hearing aids with her. Will not follow commands gave 1 time order of Haldol 2.5 mg and it had no affect on her. She will only sit in chair and we have to keep her at the Nursing station. Will continue to monitor.

## 2016-11-24 NOTE — Progress Notes (Signed)
Patient finally fell asleep around 0630 evidently she is to be discharged today.

## 2016-11-24 NOTE — Discharge Summary (Signed)
Stroke Discharge Summary  Patient ID: [name]   MRN: [id number]      DOB: 28-Sep-1917  Date of Admission: 11/21/2016 Date of Discharge: 11/24/2016  Attending Physician:  Garvin Fila, MD, Stroke MD Consultant(s):     Patient's PCP:  Blanchie Serve, MD  DISCHARGE DIAGNOSIS: Active Problems:   Intracranial hemorrhage ([place])   ICH (intracerebral hemorrhage) ([place])   Cytotoxic brain edema ([place])   Cerebral amyloid angiopathy (CODE) UTI  Past Medical History:  Diagnosis Date  . Abnormality of gait 03/15/2010  . Anxiety state, unspecified 03/15/2010  . Depression, major 06/15/2015  . Depressive disorder, not elsewhere classified 03/15/2010  . Dysphagia, unspecified(787.20) 03/15/2010  . Generalized anxiety disorder 06/23/2014  . Herpes zoster without mention of complication 4/70/9628  . Other alteration of consciousness 03/15/2010  . Pain in hand 08/13/12  . Personal history of fall 03/15/2010  . Senile osteoporosis 03/15/2010  . Unspecified constipation 03/15/2010  . Unspecified essential hypertension 03/15/2010  . Unspecified hearing loss 08/22/2011  . Unspecified urinary incontinence 03/15/2010  . Urinary incontinence 03/15/2010  . Vaginal prolapse 06/15/2015   Past Surgical History:  Procedure Laterality Date  . INCISION / DRAINAGE HAND / FINGER Right 06/2008   hand  . SPINE SURGERY  12/2006      LABORATORY STUDIES CBC    Component Value Date/Time   WBC 8.0 11/23/2016 0518   RBC 3.62 (L) 11/23/2016 0518   HGB 12.0 11/23/2016 0518   HCT 36.3 11/23/2016 0518   PLT 329 11/23/2016 0518   MCV 100.3 (H) 11/23/2016 0518   MCV 97.9 (A) 12/07/2014 1707   MCH 33.1 11/23/2016 0518   MCHC 33.1 11/23/2016 0518   RDW 13.6 11/23/2016 0518   LYMPHSABS 1.5 11/22/2016 0003   MONOABS 1.0 11/22/2016 0003   EOSABS 0.0 11/22/2016 0003   BASOSABS 0.0 11/22/2016 0003   CMP    Component Value Date/Time   NA 139 11/23/2016 0518   NA 141 07/03/2016   K 3.2 (L) 11/23/2016 0518    CL 104 11/23/2016 0518   CO2 22 11/23/2016 0518   GLUCOSE 71 11/23/2016 0518   BUN 20 11/23/2016 0518   BUN 22 (A) 07/03/2016   CREATININE 0.71 11/23/2016 0518   CREATININE 0.69 12/07/2014 1759   CALCIUM 8.5 (L) 11/23/2016 0518   PROT 6.1 (L) 11/23/2016 0518   ALBUMIN 3.3 (L) 11/23/2016 0518   AST 20 11/23/2016 0518   ALT 16 11/23/2016 0518   ALKPHOS 73 11/23/2016 0518   BILITOT 1.0 11/23/2016 0518   GFRNONAA >60 11/23/2016 0518   GFRNONAA 74 12/07/2014 1759   GFRAA >60 11/23/2016 0518   GFRAA 86 12/07/2014 1759   COAGS Lab Results  Component Value Date   INR 0.94 03/17/2016   INR 1.01 12/10/2014   INR 0.91 07/16/2009   Lipid Panel    Component Value Date/Time   CHOL 168 08/03/2016   TRIG 96 08/03/2016   HDL 56 08/03/2016   CHOLHDL 3.0 12/11/2014 0540   VLDL 21 12/11/2014 0540   LDLCALC 93 08/03/2016   HgbA1C  Lab Results  Component Value Date   HGBA1C 5.5 07/06/2016   Urinalysis    Component Value Date/Time   COLORURINE AMBER (A) 11/22/2016 0003   APPEARANCEUR CLOUDY (A) 11/22/2016 0003   LABSPEC 1.024 11/22/2016 0003   PHURINE 5.0 11/22/2016 0003   GLUCOSEU NEGATIVE 11/22/2016 0003   HGBUR NEGATIVE 11/22/2016 0003   BILIRUBINUR NEGATIVE 11/22/2016 0003   BILIRUBINUR negative 12/07/2014  1706   KETONESUR 20 (A) 11/22/2016 0003   PROTEINUR 100 (A) 11/22/2016 0003   UROBILINOGEN 1.0 12/07/2014 1706   UROBILINOGEN 0.2 01/04/2010 2342   NITRITE NEGATIVE 11/22/2016 0003   LEUKOCYTESUR TRACE (A) 11/22/2016 0003   Urine Drug Screen No results found for: LABOPIA, COCAINSCRNUR, LABBENZ, AMPHETMU, THCU, LABBARB  Alcohol Level No results found for: [facility]   SIGNIFICANT DIAGNOSTIC STUDIES Ct Head Wo Contrast Result Date: 11/22/2016 IMPRESSION: 1. Right occipital intraparenchymal hematoma measuring 17 mL without associated mass effect or hydrocephalus. The location in a patient of this age is suggestive of hemorrhage secondary to amyloid angiopathy. This could be  further assessed with MRI and, if necessary. 2. Advanced atrophy and chronic ischemic microangiopathic changes.   11/23/2016 : 1. Stable acute hemorrhage in the right occipital lobe as well as associated edema and local mass effect. 2. No new acute intracranial abnormality identified. 3. Stable chronic microvascular ischemic changes and parenchymal volume loss of the brain.   ECHO: Study Conclusions - Left ventricle: The cavity size was normal. Wall thickness was   normal. Systolic function was vigorous. The estimated ejection   fraction was in the range of 65% to 70%. Doppler parameters are   consistent with abnormal left ventricular relaxation (grade 1   diastolic dysfunction). - Left atrium: The atrium was moderately dilated. Impressions: - Poor acoustic windows limit study APical windows are   foreshortened    HISTORY OF PRESENT ILLNESS & HOSPITAL COURSE [name] is a 83 y.o. female with PMH of dementia admitted for ICH, Score of 2 Small posterior Right occipital intraparenchymal hematoma: Likely secondary to Amyloid Angiopathy Resultant:  Depressed mentation, Not following commands,   She has  poor neurological baseline from her advanced dementia and CT scan of the head shows a posterior right parieto-occipital hemorrhage likely from underlying amyloid angiopathy. Discussion with the patient's daughter who understands appropriate prognosis and agrees with DO NOT RESUSCITATE and does not want a feeding tube. She is likely to make the patient comfort care if her condition declines further but would like to watch her for 1-2 days. She was treated for a UTI in the hospital with Rocephin. Discontinued at discharge.  DISCHARGE EXAM Blood pressure (!) 157/51, pulse (!) 50, temperature 97.9 F (36.6 C), temperature source Oral, resp. rate 14, SpO2 97 %.   General - Well nourished, well developed, in no apparent distress Respiratory - No labored breathing noted on  exam Cardiovascular - Regular rate and rhythm   NEURO Baseline: Per record patient does not participate that much in conversation at baseline due to dementia, but does talk and answer questions. She is wheelchair-bound. Feeds self per reports  Mental Status: Patient is awake, will open eyes to loud voice, she does not follow commands she will occasionally say one or two words Cranial Nerves: II: + blinks to threat bilaterally. Pupils are equal, round, and reactive to light. +/- tracking III,IV, VI: EOMI without ptosis or diploplia.  V: Facial sensation is symmetric to temperature VII: Facial movement is symmetric.  VIII: hearing is intact to voice, HOH X: Uvula elevates symmetrically XI: Shoulder shrug is symmetric. XII: tongue is midline without atrophy or fasciculations.  Motor: She keeps her right hand clenched into a fist, but does move her arm voluntarily, likely weak in RUE, withdraws all 3 other extremities. Increased tone throughout with paratonia and grasp reflex Sensory: Withdraws all 4extremities to noxious stimuli  Cerebellar: She does not perform   Discharge Diet  DIET DYS 3 Room service appropriate? Yes; Fluid consistency: Thin Diet - low sodium heart healthy liquids  DISCHARGE PLAN  Disposition:  SNF  No antithrombotic for secondary stroke prevention. Do not restart ASA at this time due to [place]  Ongoing risk factor control by Primary Care Physician at time of discharge  Follow-up Blanchie Serve, MD in 2 weeks.  Follow-up with Dr Antony Contras, Stroke Clinic in 6 weeks, office to schedule an appointment.  Greater than  30 minutes were spent preparing discharge.  [facility] Team I have personally examined this patient, reviewed notes, independently viewed imaging studies, participated in medical decision making and plan of care.ROS completed by me personally and pertinent positives fully documented  I have made any additions or  clarifications directly to the above note. Agree with note above.   Antony Contras, MD Medical Director [facility] Pager: [phone] 11/24/2016 1:56 PM

## 2016-11-24 NOTE — Clinical Social Work Note (Signed)
Patient medically stable for discharge back to [facility] skilled facility. Admissions SW at [facility], [place] contacted and informed and discharge clinicals transmitted to facility. Patient's daughter Hunt Oris [phone]) contacted and informed of discharge. Ambulance transport contacted to arrange transport. CSW signing off as patient is discharging back to [facility] and transport arranged. Please reconsult if any other SW intervention needed prior to discharge.  Janiel Crisostomo Givens, MSW, LCSW Licensed Clinical Social Worker [place] [phone]

## 2016-11-27 ENCOUNTER — Encounter: Payer: Self-pay | Admitting: Internal Medicine

## 2016-11-27 ENCOUNTER — Telehealth: Payer: Self-pay

## 2016-11-27 ENCOUNTER — Non-Acute Institutional Stay (SKILLED_NURSING_FACILITY): Payer: Medicare Other | Admitting: Internal Medicine

## 2016-11-27 DIAGNOSIS — I629 Nontraumatic intracranial hemorrhage, unspecified: Secondary | ICD-10-CM

## 2016-11-27 DIAGNOSIS — E039 Hypothyroidism, unspecified: Secondary | ICD-10-CM

## 2016-11-27 DIAGNOSIS — F329 Major depressive disorder, single episode, unspecified: Secondary | ICD-10-CM | POA: Diagnosis not present

## 2016-11-27 DIAGNOSIS — R531 Weakness: Secondary | ICD-10-CM | POA: Diagnosis not present

## 2016-11-27 DIAGNOSIS — R1310 Dysphagia, unspecified: Secondary | ICD-10-CM | POA: Diagnosis not present

## 2016-11-27 DIAGNOSIS — N390 Urinary tract infection, site not specified: Secondary | ICD-10-CM

## 2016-11-27 DIAGNOSIS — R5381 Other malaise: Secondary | ICD-10-CM

## 2016-11-27 DIAGNOSIS — G309 Alzheimer's disease, unspecified: Secondary | ICD-10-CM

## 2016-11-27 DIAGNOSIS — F0281 Dementia in other diseases classified elsewhere with behavioral disturbance: Secondary | ICD-10-CM | POA: Diagnosis not present

## 2016-11-27 DIAGNOSIS — E43 Unspecified severe protein-calorie malnutrition: Secondary | ICD-10-CM | POA: Diagnosis not present

## 2016-11-27 DIAGNOSIS — I10 Essential (primary) hypertension: Secondary | ICD-10-CM | POA: Diagnosis not present

## 2016-11-27 DIAGNOSIS — Z7189 Other specified counseling: Secondary | ICD-10-CM | POA: Diagnosis not present

## 2016-11-27 DIAGNOSIS — F32A Depression, unspecified: Secondary | ICD-10-CM

## 2016-11-27 NOTE — Progress Notes (Signed)
Provider:  Blanchie Serve MD  Location:  [facility] Room Number: 68 Place of Service:  SNF (31)  PCP: Blanchie Serve, MD Patient Care Team: Blanchie Serve, MD as PCP - General (Internal Medicine) Melina Modena, [facility] Gaynelle Arabian, MD as Consulting Physician (Orthopedic Surgery) Ngetich, Nelda Bucks, NP as Nurse Practitioner (Family Medicine)  Extended Emergency Contact Information Primary Emergency Contact: Peri [place] Mobile Phone: [phone] Relation: Daughter Secondary Emergency Contact: Kerby Less [place] Phone: [phone] Relation: Daughter  Code Status: DNR Goals of Care: Advanced Directive information Advanced Directives 11/27/2016  Does Patient Have a Medical Advance Directive? Yes  Type of Advance Directive Living will;Out of facility DNR (pink MOST or yellow form)  Does patient want to make changes to medical advance directive? No - Patient declined  Copy of [place] in Chart? -  Pre-existing out of facility DNR order (yellow form or pink MOST form) Yellow form placed in chart (order not valid for inpatient use)     Chief Complaint  Patient presents with  . Readmit To SNF    Readmission Visit     HPI: Patient is a 83 y.o. female seen today for re-admission visit. She was in the hospital from 11/21/16-11/24/16 with intracranial hemorrhage post fall. CT head showed right occipital hemorrhage. She was followed by neurology, PT, OT and SLP in hospital. She was initially placed NPO and later on mechanical soft diet with thin liquids. She was treated with rocephin for possible UTI- no culture sent. Her aspirin was discontinued. She has history of advanced dementia and has minimal participation in HPI and ROS. She is seen in her room today. Unable to obtain HPI/ROS this visit.   Past Medical History:  Diagnosis Date  . Abnormality of gait 03/15/2010  . Anxiety state, unspecified  03/15/2010  . Depression, major 06/15/2015  . Depressive disorder, not elsewhere classified 03/15/2010  . Dysphagia, unspecified(787.20) 03/15/2010  . Generalized anxiety disorder 06/23/2014  . Herpes zoster without mention of complication 0/34/7425  . Other alteration of consciousness 03/15/2010  . Pain in hand 08/13/12  . Personal history of fall 03/15/2010  . Senile osteoporosis 03/15/2010  . Unspecified constipation 03/15/2010  . Unspecified essential hypertension 03/15/2010  . Unspecified hearing loss 08/22/2011  . Unspecified urinary incontinence 03/15/2010  . Urinary incontinence 03/15/2010  . Vaginal prolapse 06/15/2015   Past Surgical History:  Procedure Laterality Date  . INCISION / DRAINAGE HAND / FINGER Right 06/2008   hand  . SPINE SURGERY  12/2006    reports that  has never smoked. she has never used smokeless tobacco. She reports that she drinks alcohol. She reports that she does not use drugs. Social History   Socioeconomic History  . Marital status: Divorced    Spouse name: Not on file  . Number of children: Not on file  . Years of education: Not on file  . Highest education level: Not on file  Social Needs  . Financial resource strain: Not on file  . Food insecurity - worry: Not on file  . Food insecurity - inability: Not on file  . Transportation needs - medical: Not on file  . Transportation needs - non-medical: Not on file  Occupational History  . Occupation: [employer]  Tobacco Use  . Smoking status: Never Smoker  . Smokeless tobacco: Never Used  Substance and Sexual Activity  . Alcohol use: Yes    Comment: 2-4 oz of wine  once daily at request  . Drug use: No  . Sexual activity: No  Other Topics Concern  . Not on file  Social History Narrative   Lives at [facility], in [place] since 12/28/2014   Widowed   Walker   Never smoked   Alcohol wine occasionally    Exercise walks a lot, with walker   POA, Living Will    Functional Status Survey:     Family History  Problem Relation Age of Onset  . Heart disease Father        MI    Health Maintenance  Topic Date Due  . DEXA SCAN  01/24/2023 (Originally 07/26/1984)  . PNA vac Low Risk Adult (2 of 2 - PCV13) 11/10/2023 (Originally 01/23/2010)  . TETANUS/TDAP  07/15/2019  . INFLUENZA VACCINE  Completed    Allergies  Allergen Reactions  . Ace Inhibitors Other (See Comments)    Reaction unknown/ listed on MAR  . Hydrochlorothiazide Other (See Comments)    Reaction unknown/ listed on MAR  . Morphine And Related Other (See Comments)    Hallucinations    Outpatient Encounter Medications as of 11/27/2016  Medication Sig  . acetaminophen (TYLENOL) 325 MG tablet Take 2 tablets (650 mg total) by mouth every 6 (six) hours as needed for mild pain (or Fever >/= 101).  Marland Kitchen acetaminophen (TYLENOL) 500 MG tablet Take 1,000 mg by mouth at bedtime.  . Calcium Carbonate-Vitamin D (CALCIUM 600+D) 600-200 MG-UNIT TABS Take 1 tablet by mouth daily.   . citalopram (CELEXA) 20 MG tablet Take 20 mg by mouth daily.  . cyanocobalamin 1000 MCG tablet Take 1,000 mcg by mouth daily.   . hydrocortisone (PROCTO-MED HC) 2.5 % rectal cream Place 1 application rectally every 12 (twelve) hours as needed for hemorrhoids or itching.  Marland Kitchen ibuprofen (ADVIL,MOTRIN) 200 MG tablet Take 200 mg every 6 (six) hours as needed by mouth (for pain).   Marland Kitchen levothyroxine (SYNTHROID, LEVOTHROID) 25 MCG tablet Take 25 mcg by mouth daily before breakfast.  . metoprolol tartrate (LOPRESSOR) 25 MG tablet Take 12.5 mg by mouth at bedtime.  . metoprolol tartrate (LOPRESSOR) 25 MG tablet Take 25 mg every morning by mouth.   . mineral oil-hydrophilic petrolatum (AQUAPHOR) ointment Apply 1 application topically daily as needed for dry skin.  . mirtazapine (REMERON) 15 MG tablet Take 15 mg by mouth at bedtime.  . Nutritional Supplements (RESOURCE 2.0) LIQD Take 120 mLs by mouth 2 (two) times daily between meals. At 10AM and 4PM  . polyethylene  glycol (MIRALAX / GLYCOLAX) packet Take 17 g by mouth daily.  . risperiDONE (RISPERDAL) 0.5 MG tablet Take 0.25 mg by mouth daily. At [facility]   No facility-administered encounter medications on file as of 11/27/2016.     Review of Systems  Unable to perform ROS: Dementia (lethargic, not followng commands)    Vitals:   11/27/16 1013  BP: 137/71  Pulse: 80  Resp: 18  Temp: 99.4 F (37.4 C)  TempSrc: Oral  SpO2: 90%  Weight: 119 lb 3.2 oz (54.1 kg)  Height: 5\' 2"  (1.575 m)   Body mass index is 21.8 kg/m. Physical Exam  Constitutional: No distress.  Frail, thin built, elderly female  HENT:  Head: Normocephalic and atraumatic.  Eyes: Conjunctivae and EOM are normal. Pupils are equal, round, and reactive to light. Right eye exhibits no discharge. Left eye exhibits no discharge.  Neck: Neck supple. No thyromegaly present.  Cardiovascular: Normal rate and regular rhythm.  Pulmonary/Chest: Effort  normal and breath sounds normal. No respiratory distress. She has no wheezes. She has no rales.  Abdominal: Soft. Bowel sounds are normal. There is no tenderness. There is no guarding.  Musculoskeletal: She exhibits no edema.  Withdraws all 4 extremities to pain, generalized weakness, contracture to fingers in right hand, weakness most prominent to RUE.   Lymphadenopathy:    She has no cervical adenopathy.  Neurological:  Lethargic, responds to name call by opening her eyes, withdraws extremities to painful stimuli  Skin: Skin is warm and dry. She is not diaphoretic. No erythema.  Psychiatric:  Sleepy, lethargic    Labs reviewed: Basic Metabolic Panel: Recent Labs    01/21/16 1815  03/18/16 0847 07/03/16 11/22/16 0003 11/23/16 0518  NA  --    < > 137 141 137 139  K  --    < > 4.0 4.3 4.3 3.2*  CL  --    < > 102  --  98* 104  CO2  --    < > 26  --  26 22  GLUCOSE  --    < > 110*  --  115* 71  BUN  --    < > 24* 22* 40* 20  CREATININE  --    < > 1.06* 0.7 0.93 0.71  CALCIUM  --     < > 9.0  --  9.0 8.5*  MG 1.9  --   --   --   --   --    < > = values in this interval not displayed.   Liver Function Tests: Recent Labs    03/17/16 0907 11/22/16 0003 11/23/16 0518  AST 18 31 20   ALT 13* 19 16  ALKPHOS 59 90 73  BILITOT 1.1 1.5* 1.0  PROT 6.9 7.4 6.1*  ALBUMIN 4.2 3.7 3.3*   No results for input(s): LIPASE, AMYLASE in the last 8760 hours. No results for input(s): AMMONIA in the last 8760 hours. CBC: Recent Labs    01/21/16 1412  03/17/16 0907 03/18/16 0431 07/03/16 11/22/16 0003 11/23/16 0518  WBC 10.4   < > 10.0 11.5* 7.0 9.9 8.0  NEUTROABS 8.0*  --  6.8  --   --  7.3  --   HGB 12.0   < > 13.8 13.1 12.3 12.7 12.0  HCT 35.5*   < > 40.5 38.5 36 37.0 36.3  MCV 98.3   < > 96.4 96.3  --  99.7 100.3*  PLT 363   < > 337 355 328 334 329   < > = values in this interval not displayed.   Cardiac Enzymes: Recent Labs    01/21/16 1815 01/21/16 2202 01/22/16 0532  TROPONINI <0.03 <0.03 <0.03   BNP: Invalid input(s): POCBNP Lab Results  Component Value Date   HGBA1C 5.5 07/06/2016   Lab Results  Component Value Date   TSH 3.14 11/13/2016   Lab Results  Component Value Date   [id number] 103 04/03/2016   No results found for: FOLATE No results found for: IRON, TIBC, FERRITIN  Imaging and Procedures obtained prior to SNF admission: Ct Head Wo Contrast  Result Date: 11/22/2016 CLINICAL DATA:  83 y/o  F; intracranial hemorrhage for follow-up. EXAM: CT HEAD WITHOUT CONTRAST TECHNIQUE: Contiguous axial images were obtained from the base of the skull through the vertex without intravenous contrast. COMPARISON:  11/22/2016 CT head. FINDINGS: Brain: Stable right occipital acute hemorrhage measuring 2.2 x 3.7 x 3.8 cm (volume = 16 cm^3)(AP x ML x CC).  Stable small surrounding region of edema and local mass effect with partial effacement of occipital horn of right lateral ventricle. No new acute hemorrhage or infarct identified. Stable background of chronic  microvascular ischemic changes and parenchymal volume loss of the brain. Stable small chronic lacunar infarcts within left lentiform nucleus and caudate body. Stable calcifications of falx and tentorium cerebelli. Vascular: Calcific atherosclerosis of carotid siphons. No hyperdense vessel identified. Skull: Normal. Negative for fracture or focal lesion. Sinuses/Orbits: Partial opacification of right posterior ethmoid air cells. Otherwise negative. Other: Right intra-ocular lens replacement. IMPRESSION: 1. Stable acute hemorrhage in the right occipital lobe as well as associated edema and local mass effect. 2. No new acute intracranial abnormality identified. 3. Stable chronic microvascular ischemic changes and parenchymal volume loss of the brain. Electronically Signed   By: Kristine Garbe M.D.   On: 11/22/2016 18:04   Ct Head Wo Contrast  Result Date: 11/22/2016 CLINICAL DATA:  Altered mental status EXAM: CT HEAD WITHOUT CONTRAST TECHNIQUE: Contiguous axial images were obtained from the base of the skull through the vertex without intravenous contrast. COMPARISON:  Head CT 03/17/2016 FINDINGS: Brain: There is acute hemorrhage within the right occipital lobe. The intraparenchymal hematoma measures 2.3 x 3.7 x 3.8 cm (volume = 17 cm^3). There is mild surrounding edema. Calcifications within the basal ganglia and along the surface of the left tentorium cerebelli are unchanged. There is diffuse atrophy and chronic white matter hypoattenuation consistent with chronic ischemic microangiopathy. No midline shift or other mass effect. No hydrocephalus. No extra-axial extension of hemorrhage. Vascular: Atherosclerotic calcification of the vertebral and internal carotid arteries at the skull base. Skull: Normal visualized skull base, calvarium and extracranial soft tissues. Sinuses/Orbits: No sinus fluid levels or advanced mucosal thickening. No mastoid effusion. Normal orbits. IMPRESSION: 1. Right occipital  intraparenchymal hematoma measuring 17 mL without associated mass effect or hydrocephalus. The location in a patient of this age is suggestive of hemorrhage secondary to amyloid angiopathy. This could be further assessed with MRI and, if necessary. 2. Advanced atrophy and chronic ischemic microangiopathic changes. Critical Value/emergent results were called by telephone at the time of interpretation on 11/22/2016 at 2:14 am to Dr. Thayer Jew , who verbally acknowledged these results. Electronically Signed   By: Ulyses Jarred M.D.   On: 11/22/2016 02:14    Assessment/Plan  Generalized weakness With deconditioning and her co-morbidities. Will have patient work with PT/OT as tolerated to regain strength and restore function as tolerated.  Fall precautions are in place. With her poor prognosis, obtain palliative care consult.   Physical deconditioning Will have her work with physical therapy and occupational therapy team to help with gait training and muscle strengthening exercises.fall precautions. Skin care. Encourage to be out of bed.   Intracranial bleed Supportive care. Avoid NSAIDs and antiplatelet/ anticoagulation. D/c prn ibuprofen. Palliative care consult.   UTI Has completed antibiotics. Afebrile. Monitor clinically. Maintain hydration and perineal hygiene.   Dysphagia SLP consult. Mechanical soft diet and thin liquid for now,   Hypokalemia Low k at discharge. Check bmp and mg  HTN BP at goal. Continue metoprolol tartrate 25 mg am and 12.5 mg pm.   Severe protein calorie malnutrition Currently on remeron 15 mg daily. Provide assistance with feeding. Continue feeding supplement.   Advanced dementia with behavioral disturbance Currently on risperdal 0.25 mg daily with citalopram. Provide supportive care.   Goals of care discussion Reviewed goals of care over telephone with daughter Adolph Pollack. Social worker present during this conversation. Pt  is DNR. No further  hospitalization for now. Iv fluids and antibiotic for defined trial period if indicated. No feeding tube. MOST form has been filled out today. Reviewed goals of care and filled MOST form between 11:20-11:50 am. Answered question/ concern from daughter/HCPOA.  Hypothyroidism Continue levothyroxine 25 mcg daily  Chronic depression Continue citalopram 20 mg daily and mirtazapine 15 mg daily.    Family/ staff Communication: reviewed care plan with patient's daughter/HCPOA and charge nurse.    Labs/tests ordered: bmp, mg  Blanchie Serve, MD Internal Medicine [facility] [address] [place], [place] [address] Cell Phone (Monday-Friday 8 am - 5 pm): [phone] On Call: [phone] and follow prompts after 5 pm and on weekends Office Phone: [phone] Office Fax: [phone]

## 2016-11-27 NOTE — Telephone Encounter (Signed)
This is a patient of [place], who was admitted to [facility] after hospitalization. [facility] F/U is needed. Hospital discharge from [facility] on 11/24/2016.

## 2016-11-28 DIAGNOSIS — R29898 Other symptoms and signs involving the musculoskeletal system: Secondary | ICD-10-CM | POA: Diagnosis not present

## 2016-11-28 DIAGNOSIS — M24541 Contracture, right hand: Secondary | ICD-10-CM | POA: Diagnosis not present

## 2016-11-28 DIAGNOSIS — R1311 Dysphagia, oral phase: Secondary | ICD-10-CM | POA: Diagnosis not present

## 2016-11-28 DIAGNOSIS — R32 Unspecified urinary incontinence: Secondary | ICD-10-CM | POA: Diagnosis not present

## 2016-11-28 DIAGNOSIS — R269 Unspecified abnormalities of gait and mobility: Secondary | ICD-10-CM | POA: Diagnosis not present

## 2016-11-28 DIAGNOSIS — M79643 Pain in unspecified hand: Secondary | ICD-10-CM | POA: Diagnosis not present

## 2016-11-28 DIAGNOSIS — M6530 Trigger finger, unspecified finger: Secondary | ICD-10-CM | POA: Diagnosis not present

## 2016-11-28 DIAGNOSIS — I10 Essential (primary) hypertension: Secondary | ICD-10-CM | POA: Diagnosis not present

## 2016-11-28 DIAGNOSIS — I6910 Unspecified sequelae of nontraumatic intracerebral hemorrhage: Secondary | ICD-10-CM | POA: Diagnosis not present

## 2016-11-28 DIAGNOSIS — M6281 Muscle weakness (generalized): Secondary | ICD-10-CM | POA: Diagnosis not present

## 2016-11-28 DIAGNOSIS — F329 Major depressive disorder, single episode, unspecified: Secondary | ICD-10-CM | POA: Diagnosis not present

## 2016-11-28 NOTE — Addendum Note (Signed)
Addended by: Royann Shivers A on: 11/28/2016 02:08 PM   Modules accepted: Orders

## 2016-11-29 DIAGNOSIS — M6530 Trigger finger, unspecified finger: Secondary | ICD-10-CM | POA: Diagnosis not present

## 2016-11-29 DIAGNOSIS — M6281 Muscle weakness (generalized): Secondary | ICD-10-CM | POA: Diagnosis not present

## 2016-11-29 DIAGNOSIS — R29898 Other symptoms and signs involving the musculoskeletal system: Secondary | ICD-10-CM | POA: Diagnosis not present

## 2016-11-29 DIAGNOSIS — I6910 Unspecified sequelae of nontraumatic intracerebral hemorrhage: Secondary | ICD-10-CM | POA: Diagnosis not present

## 2016-11-29 DIAGNOSIS — R1311 Dysphagia, oral phase: Secondary | ICD-10-CM | POA: Diagnosis not present

## 2016-11-29 DIAGNOSIS — M24541 Contracture, right hand: Secondary | ICD-10-CM | POA: Diagnosis not present

## 2016-11-30 ENCOUNTER — Encounter: Payer: Self-pay | Admitting: *Deleted

## 2016-11-30 DIAGNOSIS — E876 Hypokalemia: Secondary | ICD-10-CM | POA: Diagnosis not present

## 2016-11-30 DIAGNOSIS — R29898 Other symptoms and signs involving the musculoskeletal system: Secondary | ICD-10-CM | POA: Diagnosis not present

## 2016-11-30 DIAGNOSIS — R531 Weakness: Secondary | ICD-10-CM | POA: Diagnosis not present

## 2016-11-30 DIAGNOSIS — R1311 Dysphagia, oral phase: Secondary | ICD-10-CM | POA: Diagnosis not present

## 2016-11-30 DIAGNOSIS — M6530 Trigger finger, unspecified finger: Secondary | ICD-10-CM | POA: Diagnosis not present

## 2016-11-30 DIAGNOSIS — I10 Essential (primary) hypertension: Secondary | ICD-10-CM | POA: Diagnosis not present

## 2016-11-30 DIAGNOSIS — I6910 Unspecified sequelae of nontraumatic intracerebral hemorrhage: Secondary | ICD-10-CM | POA: Diagnosis not present

## 2016-11-30 DIAGNOSIS — M6281 Muscle weakness (generalized): Secondary | ICD-10-CM | POA: Diagnosis not present

## 2016-11-30 DIAGNOSIS — M24541 Contracture, right hand: Secondary | ICD-10-CM | POA: Diagnosis not present

## 2016-11-30 LAB — BASIC METABOLIC PANEL
BUN: 32 — AB (ref 4–21)
BUN: 32 — AB (ref 4–21)
Calcium: 8.6
Creat: 0.62
Creatinine: 0.6 (ref 0.5–1.1)
Glucose: 99
Glucose: 99
POTASSIUM: 3.9 (ref 3.4–5.3)
Potassium: 3.9
SODIUM: 142 (ref 137–147)
Sodium: 142

## 2016-11-30 LAB — MAGNESIUM: Magnesium: 1.9

## 2016-12-02 DIAGNOSIS — M6281 Muscle weakness (generalized): Secondary | ICD-10-CM | POA: Diagnosis not present

## 2016-12-02 DIAGNOSIS — R29898 Other symptoms and signs involving the musculoskeletal system: Secondary | ICD-10-CM | POA: Diagnosis not present

## 2016-12-02 DIAGNOSIS — M24541 Contracture, right hand: Secondary | ICD-10-CM | POA: Diagnosis not present

## 2016-12-02 DIAGNOSIS — M6530 Trigger finger, unspecified finger: Secondary | ICD-10-CM | POA: Diagnosis not present

## 2016-12-02 DIAGNOSIS — R1311 Dysphagia, oral phase: Secondary | ICD-10-CM | POA: Diagnosis not present

## 2016-12-02 DIAGNOSIS — I6910 Unspecified sequelae of nontraumatic intracerebral hemorrhage: Secondary | ICD-10-CM | POA: Diagnosis not present

## 2016-12-04 DIAGNOSIS — R1311 Dysphagia, oral phase: Secondary | ICD-10-CM | POA: Diagnosis not present

## 2016-12-04 DIAGNOSIS — M6281 Muscle weakness (generalized): Secondary | ICD-10-CM | POA: Diagnosis not present

## 2016-12-04 DIAGNOSIS — M6530 Trigger finger, unspecified finger: Secondary | ICD-10-CM | POA: Diagnosis not present

## 2016-12-04 DIAGNOSIS — I6910 Unspecified sequelae of nontraumatic intracerebral hemorrhage: Secondary | ICD-10-CM | POA: Diagnosis not present

## 2016-12-04 DIAGNOSIS — R29898 Other symptoms and signs involving the musculoskeletal system: Secondary | ICD-10-CM | POA: Diagnosis not present

## 2016-12-04 DIAGNOSIS — M24541 Contracture, right hand: Secondary | ICD-10-CM | POA: Diagnosis not present

## 2016-12-05 DIAGNOSIS — R29898 Other symptoms and signs involving the musculoskeletal system: Secondary | ICD-10-CM | POA: Diagnosis not present

## 2016-12-05 DIAGNOSIS — R1311 Dysphagia, oral phase: Secondary | ICD-10-CM | POA: Diagnosis not present

## 2016-12-05 DIAGNOSIS — M24541 Contracture, right hand: Secondary | ICD-10-CM | POA: Diagnosis not present

## 2016-12-05 DIAGNOSIS — I6910 Unspecified sequelae of nontraumatic intracerebral hemorrhage: Secondary | ICD-10-CM | POA: Diagnosis not present

## 2016-12-05 DIAGNOSIS — M6530 Trigger finger, unspecified finger: Secondary | ICD-10-CM | POA: Diagnosis not present

## 2016-12-05 DIAGNOSIS — M6281 Muscle weakness (generalized): Secondary | ICD-10-CM | POA: Diagnosis not present

## 2016-12-06 DIAGNOSIS — M6530 Trigger finger, unspecified finger: Secondary | ICD-10-CM | POA: Diagnosis not present

## 2016-12-06 DIAGNOSIS — M6281 Muscle weakness (generalized): Secondary | ICD-10-CM | POA: Diagnosis not present

## 2016-12-06 DIAGNOSIS — I6910 Unspecified sequelae of nontraumatic intracerebral hemorrhage: Secondary | ICD-10-CM | POA: Diagnosis not present

## 2016-12-06 DIAGNOSIS — R29898 Other symptoms and signs involving the musculoskeletal system: Secondary | ICD-10-CM | POA: Diagnosis not present

## 2016-12-06 DIAGNOSIS — M24541 Contracture, right hand: Secondary | ICD-10-CM | POA: Diagnosis not present

## 2016-12-06 DIAGNOSIS — R1311 Dysphagia, oral phase: Secondary | ICD-10-CM | POA: Diagnosis not present

## 2016-12-07 ENCOUNTER — Encounter: Payer: Self-pay | Admitting: Family

## 2016-12-07 ENCOUNTER — Non-Acute Institutional Stay (SKILLED_NURSING_FACILITY): Payer: Medicare Other | Admitting: Family

## 2016-12-07 DIAGNOSIS — R2681 Unsteadiness on feet: Secondary | ICD-10-CM

## 2016-12-07 DIAGNOSIS — I10 Essential (primary) hypertension: Secondary | ICD-10-CM | POA: Diagnosis not present

## 2016-12-07 DIAGNOSIS — R29898 Other symptoms and signs involving the musculoskeletal system: Secondary | ICD-10-CM | POA: Diagnosis not present

## 2016-12-07 DIAGNOSIS — W19XXXA Unspecified fall, initial encounter: Secondary | ICD-10-CM

## 2016-12-07 DIAGNOSIS — M6530 Trigger finger, unspecified finger: Secondary | ICD-10-CM | POA: Diagnosis not present

## 2016-12-07 DIAGNOSIS — M6281 Muscle weakness (generalized): Secondary | ICD-10-CM | POA: Diagnosis not present

## 2016-12-07 DIAGNOSIS — I6910 Unspecified sequelae of nontraumatic intracerebral hemorrhage: Secondary | ICD-10-CM | POA: Diagnosis not present

## 2016-12-07 DIAGNOSIS — M24541 Contracture, right hand: Secondary | ICD-10-CM | POA: Diagnosis not present

## 2016-12-07 DIAGNOSIS — R1311 Dysphagia, oral phase: Secondary | ICD-10-CM | POA: Diagnosis not present

## 2016-12-08 DIAGNOSIS — M6530 Trigger finger, unspecified finger: Secondary | ICD-10-CM | POA: Diagnosis not present

## 2016-12-08 DIAGNOSIS — I6910 Unspecified sequelae of nontraumatic intracerebral hemorrhage: Secondary | ICD-10-CM | POA: Diagnosis not present

## 2016-12-08 DIAGNOSIS — R29898 Other symptoms and signs involving the musculoskeletal system: Secondary | ICD-10-CM | POA: Diagnosis not present

## 2016-12-08 DIAGNOSIS — M6281 Muscle weakness (generalized): Secondary | ICD-10-CM | POA: Diagnosis not present

## 2016-12-08 DIAGNOSIS — M24541 Contracture, right hand: Secondary | ICD-10-CM | POA: Diagnosis not present

## 2016-12-08 DIAGNOSIS — R1311 Dysphagia, oral phase: Secondary | ICD-10-CM | POA: Diagnosis not present

## 2016-12-08 NOTE — Progress Notes (Signed)
Location:  [facility] Room Number: 56 Place of Service:  SNF (31) Provider: Aina Rossbach FNP-C  Blanchie Serve, MD  Patient Care Team: Blanchie Serve, MD as PCP - General (Internal Medicine) Melina Modena, [facility] Gaynelle Arabian, MD as Consulting Physician (Orthopedic Surgery) Treveon Bourcier, Nelda Bucks, NP as Nurse Practitioner (Family Medicine)  Extended Emergency Contact Information Primary Emergency Contact: [place] Mobile Phone: [phone] Relation: Daughter Secondary Emergency Contact: Kerby [place] Phone: [phone] Relation: Daughter  Code Status:  DNR Goals of care: Advanced Directive information Advanced Directives 12/07/2016  Does Patient Have a Medical Advance Directive? Yes  Type of Paramedic of [place];Out of facility DNR (pink MOST or yellow form);Living will  Does patient want to make changes to medical advance directive? -  Copy of [place] in Chart? Yes  Pre-existing out of facility DNR order (yellow form or pink MOST form) Physician notified to receive inpatient order;Yellow form placed in chart (order not valid for inpatient use);Pink MOST form placed in chart (order not valid for inpatient use)     Chief Complaint  Patient presents with  . Acute Visit    fall x 2 in one day    HPI:  Pt is a 83 y.o. female seen today at [facility] for an acute visit for evaluation of fall.She is seen in her room today per facility Nurse request. Nurse reports patient has had two fall episode in one day.On 12/06/2016 patient fell on hallway trying to attempt to get up without any assistance.On 12/07/2016 she was found by facility staff sitting at bedside on floor mat. No injuries sustained.No fever, chills or cough reported.HPI and ROS limited due to her cognitive impairment. SBP elevated post fall B/p log reviewed has normal range readings with  occasional SBP> 150.   Past Medical History:  Diagnosis Date  . Abnormality of gait 03/15/2010  . Anxiety state, unspecified 03/15/2010  . Depression, major 06/15/2015  . Depressive disorder, not elsewhere classified 03/15/2010  . Dysphagia, unspecified(787.20) 03/15/2010  . Generalized anxiety disorder 06/23/2014  . Herpes zoster without mention of complication 1/44/3154  . Other alteration of consciousness 03/15/2010  . Pain in hand 08/13/12  . Personal history of fall 03/15/2010  . Senile osteoporosis 03/15/2010  . Unspecified constipation 03/15/2010  . Unspecified essential hypertension 03/15/2010  . Unspecified hearing loss 08/22/2011  . Unspecified urinary incontinence 03/15/2010  . Urinary incontinence 03/15/2010  . Vaginal prolapse 06/15/2015   Past Surgical History:  Procedure Laterality Date  . INCISION / DRAINAGE HAND / FINGER Right 06/2008   hand  . SPINE SURGERY  12/2006    Allergies  Allergen Reactions  . Ace Inhibitors Other (See Comments)    Reaction unknown/ listed on MAR  . Hydrochlorothiazide Other (See Comments)    Reaction unknown/ listed on MAR  . Morphine And Related Other (See Comments)    Hallucinations    Outpatient Encounter Medications as of 12/07/2016  Medication Sig  . acetaminophen (TYLENOL) 325 MG tablet Take 2 tablets (650 mg total) by mouth every 6 (six) hours as needed for mild pain (or Fever >/= 101).  Marland Kitchen acetaminophen (TYLENOL) 500 MG tablet Take 1,000 mg by mouth at bedtime.  . Calcium Carbonate-Vitamin D (CALCIUM 600+D) 600-200 MG-UNIT TABS Take 1 tablet by mouth daily.   . citalopram (CELEXA) 20 MG tablet Take 20 mg by mouth daily.  . cyanocobalamin 1000 MCG tablet Take 1,000  mcg by mouth daily.   . hydrocortisone (PROCTO-MED HC) 2.5 % rectal cream Place 1 application rectally every 12 (twelve) hours as needed for hemorrhoids or itching.  . levothyroxine (SYNTHROID, LEVOTHROID) 25 MCG tablet Take 25 mcg by mouth daily before breakfast.  .  LORazepam (ATIVAN) 0.5 MG tablet Take 0.25 mg every 8 (eight) hours as needed by mouth for anxiety.  . metoprolol tartrate (LOPRESSOR) 25 MG tablet Take 12.5 mg by mouth at bedtime.  . metoprolol tartrate (LOPRESSOR) 25 MG tablet Take 25 mg every morning by mouth.   . mineral oil-hydrophilic petrolatum (AQUAPHOR) ointment Apply 1 application topically daily as needed for dry skin.  . mirtazapine (REMERON) 15 MG tablet Take 15 mg by mouth at bedtime.  . Nutritional Supplements (RESOURCE 2.0) LIQD Take 120 mLs by mouth 2 (two) times daily between meals. At 10AM and 4PM  . polyethylene glycol (MIRALAX / GLYCOLAX) packet Take 17 g by mouth daily.  . risperiDONE (RISPERDAL) 0.5 MG tablet Take 0.25 mg by mouth daily. At [facility]   No facility-administered encounter medications on file as of 12/07/2016.     Review of Systems  Unable to perform ROS: Dementia    Immunization History  Administered Date(s) Administered  . Influenza Whole 10/24/2011, 10/24/2012  . Influenza-Unspecified 11/06/2013, 11/04/2015, 11/02/2016  . PPD Test 07/27/2009, 12/28/2014, 01/11/2015  . Pneumococcal Polysaccharide-23 01/23/2009  . Pneumococcal-Unspecified 01/23/2005  . Td 07/14/2009   Pertinent  Health Maintenance Due  Topic Date Due  . DEXA SCAN  01/24/2023 (Originally 07/26/1984)  . PNA vac Low Risk Adult (2 of 2 - PCV13) 11/10/2023 (Originally 01/23/2010)  . INFLUENZA VACCINE  Completed   Fall Risk  08/28/2016 03/20/2016 09/21/2015 06/15/2015 01/05/2015  Falls in the past year? Yes Yes No No No  Number falls in past yr: 2 or more - - - -  Injury with Fall? Yes Yes - - -  Comment - fracture right knee - - -  Risk Factor Category  - High Fall Risk - - -    Vitals:   12/07/16 1322  BP: (!) 174/90  Pulse: (!) 50  Resp: 18  Temp: 97.9 F (36.6 C)  SpO2: 96%  Weight: 110 lb 3.2 oz (50 kg)  Height: 5\' 2"  (1.575 m)   Body mass index is 20.16 kg/m. Physical Exam  Constitutional:  Thin frail elderly in no acute  distress   HENT:  Head: Normocephalic.  Mouth/Throat: Oropharynx is clear and moist. No oropharyngeal exudate.  Eyes: Conjunctivae and EOM are normal. Pupils are equal, round, and reactive to light. Right eye exhibits no discharge. Left eye exhibits no discharge. No scleral icterus.  Neck: Neck supple. No JVD present. No thyromegaly present.  Cardiovascular: Normal rate, regular rhythm, normal heart sounds and intact distal pulses. Exam reveals no gallop and no friction rub.  No murmur heard. Pulmonary/Chest: Effort normal and breath sounds normal. No respiratory distress. She has no wheezes. She has no rales.  Abdominal: Soft. Bowel sounds are normal. She exhibits no distension. There is no tenderness. There is no rebound and no guarding.  Musculoskeletal:  Unsteady gait uses wheelchair with assistance.Moves x 4 extremities without difficulties.Right hand finger contractures splint in place.     Lymphadenopathy:    She has no cervical adenopathy.  Neurological: Coordination normal.  Pleasantly confused at her baseline.   Skin: Skin is warm and dry. No rash noted. No erythema.  Psychiatric: She has a normal mood and affect.   Labs reviewed: Recent Labs  01/21/16 1815  03/18/16 0847  11/22/16 0003 11/23/16 0518 11/30/16  NA  --    < > 137   < > 137 139 142  K  --    < > 4.0   < > 4.3 3.2* 3.9  CL  --    < > 102  --  98* 104  --   CO2  --    < > 26  --  26 22  --   GLUCOSE  --    < > 110*  --  115* 71  --   BUN  --    < > 24*   < > 40* 20 32*  CREATININE  --    < > 1.06*   < > 0.93 0.71 0.62  CALCIUM  --    < > 9.0  --  9.0 8.5* 8.6  MG 1.9  --   --   --   --   --  1.9   < > = values in this interval not displayed.   Recent Labs    03/17/16 0907 11/22/16 0003 11/23/16 0518  AST 18 31 20   ALT 13* 19 16  ALKPHOS 59 90 73  BILITOT 1.1 1.5* 1.0  PROT 6.9 7.4 6.1*  ALBUMIN 4.2 3.7 3.3*   Recent Labs    01/21/16 1412  03/17/16 0907 03/18/16 0431 07/03/16 11/22/16 0003  11/23/16 0518  WBC 10.4   < > 10.0 11.5* 7.0 9.9 8.0  NEUTROABS 8.0*  --  6.8  --   --  7.3  --   HGB 12.0   < > 13.8 13.1 12.3 12.7 12.0  HCT 35.5*   < > 40.5 38.5 36 37.0 36.3  MCV 98.3   < > 96.4 96.3  --  99.7 100.3*  PLT 363   < > 337 355 328 334 329   < > = values in this interval not displayed.   Lab Results  Component Value Date   TSH 3.14 11/13/2016   Lab Results  Component Value Date   HGBA1C 5.5 07/06/2016   Lab Results  Component Value Date   CHOL 168 08/03/2016   HDL 56 08/03/2016   LDLCALC 93 08/03/2016   TRIG 96 08/03/2016   CHOLHDL 3.0 12/11/2014   Significant Diagnostic Results in last 30 days:  Ct Head Wo Contrast  Result Date: 11/22/2016 CLINICAL DATA:  83 y/o  F; intracranial hemorrhage for follow-up. EXAM: CT HEAD WITHOUT CONTRAST TECHNIQUE: Contiguous axial images were obtained from the base of the skull through the vertex without intravenous contrast. COMPARISON:  11/22/2016 CT head. FINDINGS: Brain: Stable right occipital acute hemorrhage measuring 2.2 x 3.7 x 3.8 cm (volume = 16 cm^3)(AP x ML x CC). Stable small surrounding region of edema and local mass effect with partial effacement of occipital horn of right lateral ventricle. No new acute hemorrhage or infarct identified. Stable background of chronic microvascular ischemic changes and parenchymal volume loss of the brain. Stable small chronic lacunar infarcts within left lentiform nucleus and caudate body. Stable calcifications of falx and tentorium cerebelli. Vascular: Calcific atherosclerosis of carotid siphons. No hyperdense vessel identified. Skull: Normal. Negative for fracture or focal lesion. Sinuses/Orbits: Partial opacification of right posterior ethmoid air cells. Otherwise negative. Other: Right intra-ocular lens replacement. IMPRESSION: 1. Stable acute hemorrhage in the right occipital lobe as well as associated edema and local mass effect. 2. No new acute intracranial abnormality identified. 3.  Stable chronic microvascular ischemic changes and parenchymal volume  loss of the brain. Electronically Signed   By: Kristine Garbe M.D.   On: 11/22/2016 18:04   Ct Head Wo Contrast  Result Date: 11/22/2016 CLINICAL DATA:  Altered mental status EXAM: CT HEAD WITHOUT CONTRAST TECHNIQUE: Contiguous axial images were obtained from the base of the skull through the vertex without intravenous contrast. COMPARISON:  Head CT 03/17/2016 FINDINGS: Brain: There is acute hemorrhage within the right occipital lobe. The intraparenchymal hematoma measures 2.3 x 3.7 x 3.8 cm (volume = 17 cm^3). There is mild surrounding edema. Calcifications within the basal ganglia and along the surface of the left tentorium cerebelli are unchanged. There is diffuse atrophy and chronic white matter hypoattenuation consistent with chronic ischemic microangiopathy. No midline shift or other mass effect. No hydrocephalus. No extra-axial extension of hemorrhage. Vascular: Atherosclerotic calcification of the vertebral and internal carotid arteries at the skull base. Skull: Normal visualized skull base, calvarium and extracranial soft tissues. Sinuses/Orbits: No sinus fluid levels or advanced mucosal thickening. No mastoid effusion. Normal orbits. IMPRESSION: 1. Right occipital intraparenchymal hematoma measuring 17 mL without associated mass effect or hydrocephalus. The location in a patient of this age is suggestive of hemorrhage secondary to amyloid angiopathy. This could be further assessed with MRI and, if necessary. 2. Advanced atrophy and chronic ischemic microangiopathic changes. Critical Value/emergent results were called by telephone at the time of interpretation on 11/22/2016 at 2:14 am to Dr. Thayer Jew , who verbally acknowledged these results. Electronically Signed   By: Ulyses Jarred M.D.   On: 11/22/2016 02:14   Assessment/Plan 1. Fall, initial encounter Has had two fall episode within one day.She remains high  risk for falls due to her advance age, frailty and her cognitive impairment.No injuries sustained.continue with fall and safety precautions.     2. Essential hypertension SBP elevated post fall B/p log reviewed has normal range readings with occasional SBP> 150.continue to monitor.   3. Unsteady gait Continue to work with PT/OT for ROM, exercise, gait stability and muscle strengthening.Continue to monitor.   Family/ staff Communication: Reviewed plan of care with patient and facility Nurse.   Labs/tests ordered: None   Donae Kueker C Sukhdeep Wieting, NP

## 2016-12-11 DIAGNOSIS — I6910 Unspecified sequelae of nontraumatic intracerebral hemorrhage: Secondary | ICD-10-CM | POA: Diagnosis not present

## 2016-12-11 DIAGNOSIS — R29898 Other symptoms and signs involving the musculoskeletal system: Secondary | ICD-10-CM | POA: Diagnosis not present

## 2016-12-11 DIAGNOSIS — M6530 Trigger finger, unspecified finger: Secondary | ICD-10-CM | POA: Diagnosis not present

## 2016-12-11 DIAGNOSIS — M6281 Muscle weakness (generalized): Secondary | ICD-10-CM | POA: Diagnosis not present

## 2016-12-11 DIAGNOSIS — R1311 Dysphagia, oral phase: Secondary | ICD-10-CM | POA: Diagnosis not present

## 2016-12-11 DIAGNOSIS — M24541 Contracture, right hand: Secondary | ICD-10-CM | POA: Diagnosis not present

## 2016-12-12 DIAGNOSIS — I6910 Unspecified sequelae of nontraumatic intracerebral hemorrhage: Secondary | ICD-10-CM | POA: Diagnosis not present

## 2016-12-12 DIAGNOSIS — M24541 Contracture, right hand: Secondary | ICD-10-CM | POA: Diagnosis not present

## 2016-12-12 DIAGNOSIS — R1311 Dysphagia, oral phase: Secondary | ICD-10-CM | POA: Diagnosis not present

## 2016-12-12 DIAGNOSIS — R29898 Other symptoms and signs involving the musculoskeletal system: Secondary | ICD-10-CM | POA: Diagnosis not present

## 2016-12-12 DIAGNOSIS — M6281 Muscle weakness (generalized): Secondary | ICD-10-CM | POA: Diagnosis not present

## 2016-12-12 DIAGNOSIS — M6530 Trigger finger, unspecified finger: Secondary | ICD-10-CM | POA: Diagnosis not present

## 2016-12-13 DIAGNOSIS — M6281 Muscle weakness (generalized): Secondary | ICD-10-CM | POA: Diagnosis not present

## 2016-12-13 DIAGNOSIS — M24541 Contracture, right hand: Secondary | ICD-10-CM | POA: Diagnosis not present

## 2016-12-13 DIAGNOSIS — R1311 Dysphagia, oral phase: Secondary | ICD-10-CM | POA: Diagnosis not present

## 2016-12-13 DIAGNOSIS — R29898 Other symptoms and signs involving the musculoskeletal system: Secondary | ICD-10-CM | POA: Diagnosis not present

## 2016-12-13 DIAGNOSIS — I6910 Unspecified sequelae of nontraumatic intracerebral hemorrhage: Secondary | ICD-10-CM | POA: Diagnosis not present

## 2016-12-13 DIAGNOSIS — M6530 Trigger finger, unspecified finger: Secondary | ICD-10-CM | POA: Diagnosis not present

## 2016-12-15 DIAGNOSIS — R1311 Dysphagia, oral phase: Secondary | ICD-10-CM | POA: Diagnosis not present

## 2016-12-15 DIAGNOSIS — R29898 Other symptoms and signs involving the musculoskeletal system: Secondary | ICD-10-CM | POA: Diagnosis not present

## 2016-12-15 DIAGNOSIS — I6910 Unspecified sequelae of nontraumatic intracerebral hemorrhage: Secondary | ICD-10-CM | POA: Diagnosis not present

## 2016-12-15 DIAGNOSIS — M6281 Muscle weakness (generalized): Secondary | ICD-10-CM | POA: Diagnosis not present

## 2016-12-15 DIAGNOSIS — M24541 Contracture, right hand: Secondary | ICD-10-CM | POA: Diagnosis not present

## 2016-12-15 DIAGNOSIS — M6530 Trigger finger, unspecified finger: Secondary | ICD-10-CM | POA: Diagnosis not present

## 2016-12-18 ENCOUNTER — Non-Acute Institutional Stay (SKILLED_NURSING_FACILITY): Payer: Medicare Other | Admitting: Family

## 2016-12-18 ENCOUNTER — Encounter: Payer: Self-pay | Admitting: Family

## 2016-12-18 DIAGNOSIS — F329 Major depressive disorder, single episode, unspecified: Secondary | ICD-10-CM | POA: Diagnosis not present

## 2016-12-18 DIAGNOSIS — M24541 Contracture, right hand: Secondary | ICD-10-CM | POA: Diagnosis not present

## 2016-12-18 DIAGNOSIS — M6281 Muscle weakness (generalized): Secondary | ICD-10-CM | POA: Diagnosis not present

## 2016-12-18 DIAGNOSIS — F32A Depression, unspecified: Secondary | ICD-10-CM

## 2016-12-18 DIAGNOSIS — I10 Essential (primary) hypertension: Secondary | ICD-10-CM

## 2016-12-18 DIAGNOSIS — E039 Hypothyroidism, unspecified: Secondary | ICD-10-CM

## 2016-12-18 DIAGNOSIS — I6910 Unspecified sequelae of nontraumatic intracerebral hemorrhage: Secondary | ICD-10-CM | POA: Diagnosis not present

## 2016-12-18 DIAGNOSIS — R29898 Other symptoms and signs involving the musculoskeletal system: Secondary | ICD-10-CM | POA: Diagnosis not present

## 2016-12-18 DIAGNOSIS — F0281 Dementia in other diseases classified elsewhere with behavioral disturbance: Secondary | ICD-10-CM

## 2016-12-18 DIAGNOSIS — R1311 Dysphagia, oral phase: Secondary | ICD-10-CM | POA: Diagnosis not present

## 2016-12-18 DIAGNOSIS — G309 Alzheimer's disease, unspecified: Secondary | ICD-10-CM | POA: Diagnosis not present

## 2016-12-18 DIAGNOSIS — M6530 Trigger finger, unspecified finger: Secondary | ICD-10-CM | POA: Diagnosis not present

## 2016-12-18 NOTE — Progress Notes (Signed)
Location:  [facility] Room Number: 55 Place of Service:  SNF (31) Provider: Braelee Herrle FNP-C   Blanchie Serve, MD  Patient Care Team: Blanchie Serve, MD as PCP - General (Internal Medicine) Melina Modena, [facility] Gaynelle Arabian, MD as Consulting Physician (Orthopedic Surgery) Victorya Hillman, Nelda Bucks, NP as Nurse Practitioner (Family Medicine)  Extended Emergency Contact Information Primary Emergency Contact: [place] Mobile Phone: [phone] Relation: Daughter Secondary Emergency Contact: Kerby Less [place] of [place] Phone: [phone] Relation: Daughter  Code Status:  DNR Goals of care: Advanced Directive information Advanced Directives 12/18/2016  Does Patient Have a Medical Advance Directive? Yes  Type of Paramedic of [place];Out of facility DNR (pink MOST or yellow form);Living will  Does patient want to make changes to medical advance directive? -  Copy of [place] in Chart? Yes  Pre-existing out of facility DNR order (yellow form or pink MOST form) Yellow form placed in chart (order not valid for inpatient use);Pink MOST form placed in chart (order not valid for inpatient use)     Chief Complaint  Patient presents with  . Medical Management of Chronic Issues    monthly routine visit    HPI:  Pt is a 83 y.o. female seen today [place] for medical management of chronic diseases.she has a medical history of HTN,TIA,Hypothyroidism,Alzheimer's dementia,Depression, GAD among other other conditions.She is unable to provide HPI and ROS due to her cognitive.Facility Nurse reports patient has good appetite.she has had no recent hospital admission since 11/21/2016-11/24/2016 with intracranial hemorrhage post fall.CT head showed right occipital hemorrhage.she remains high risk for falls;has had two falls episode on 12/06/2016 and 12/07/2016.she has had a 7.9 pounds wt  119.2 lbs (11/15/2016);111.3 lbs (12/13/2016).she continues to follow up with palliative care.   Past Medical History:  Diagnosis Date  . Abnormality of gait 03/15/2010  . Anxiety state, unspecified 03/15/2010  . Depression, major 06/15/2015  . Depressive disorder, not elsewhere classified 03/15/2010  . Dysphagia, unspecified(787.20) 03/15/2010  . Generalized anxiety disorder 06/23/2014  . Herpes zoster without mention of complication 8/46/9629  . Other alteration of consciousness 03/15/2010  . Pain in hand 08/13/12  . Personal history of fall 03/15/2010  . Senile osteoporosis 03/15/2010  . Unspecified constipation 03/15/2010  . Unspecified essential hypertension 03/15/2010  . Unspecified hearing loss 08/22/2011  . Unspecified urinary incontinence 03/15/2010  . Urinary incontinence 03/15/2010  . Vaginal prolapse 06/15/2015   Past Surgical History:  Procedure Laterality Date  . INCISION / DRAINAGE HAND / FINGER Right 06/2008   hand  . SPINE SURGERY  12/2006    Allergies  Allergen Reactions  . Ace Inhibitors Other (See Comments)    Reaction unknown/ listed on MAR  . Hydrochlorothiazide Other (See Comments)    Reaction unknown/ listed on MAR  . Morphine And Related Other (See Comments)    Hallucinations    Allergies as of 12/18/2016      Reactions   Ace Inhibitors Other (See Comments)   Reaction unknown/ listed on MAR   Hydrochlorothiazide Other (See Comments)   Reaction unknown/ listed on MAR   Morphine And Related Other (See Comments)   Hallucinations      Medication List        Accurate as of 12/18/16  5:05 PM. Always use your most recent med list.          acetaminophen 500 MG tablet Commonly known as:  TYLENOL Take 1,000 mg  by mouth at bedtime.   acetaminophen 325 MG tablet Commonly known as:  TYLENOL Take 2 tablets (650 mg total) by mouth every 6 (six) hours as needed for mild pain (or Fever >/= 101).   CALCIUM 600+D 600-200 MG-UNIT Tabs Generic drug:  Calcium  Carbonate-Vitamin D Take 1 tablet by mouth daily.   citalopram 20 MG tablet Commonly known as:  CELEXA Take 20 mg by mouth daily.   cyanocobalamin 1000 MCG tablet Take 1,000 mcg by mouth daily.   levothyroxine 25 MCG tablet Commonly known as:  SYNTHROID, LEVOTHROID Take 25 mcg by mouth daily before breakfast.   LORazepam 0.5 MG tablet Commonly known as:  ATIVAN Take 0.25 mg every 8 (eight) hours as needed by mouth for anxiety.   metoprolol tartrate 25 MG tablet Commonly known as:  LOPRESSOR Take 12.5 mg by mouth at bedtime.   metoprolol tartrate 25 MG tablet Commonly known as:  LOPRESSOR Take 25 mg every morning by mouth.   mineral oil-hydrophilic petrolatum ointment Apply 1 application topically daily as needed for dry skin.   mirtazapine 15 MG tablet Commonly known as:  REMERON Take 15 mg by mouth at bedtime.   polyethylene glycol packet Commonly known as:  MIRALAX / GLYCOLAX Take 17 g by mouth daily.   PROCTO-MED HC 2.5 % rectal cream Generic drug:  hydrocortisone Place 1 application rectally every 12 (twelve) hours as needed for hemorrhoids or itching.   RESOURCE 2.0 Liqd Take 120 mLs by mouth 2 (two) times daily between meals. At 10AM and 4PM   risperiDONE 0.5 MG tablet Commonly known as:  RISPERDAL Take 0.25 mg by mouth daily. At [facility]       Review of Systems  Unable to perform ROS: Dementia    Immunization History  Administered Date(s) Administered  . Influenza Whole 10/24/2011, 10/24/2012  . Influenza-Unspecified 11/06/2013, 11/04/2015, 11/02/2016  . PPD Test 07/27/2009, 12/28/2014, 01/11/2015  . Pneumococcal Polysaccharide-23 01/23/2009  . Pneumococcal-Unspecified 01/23/2005  . Td 07/14/2009   Pertinent  Health Maintenance Due  Topic Date Due  . DEXA SCAN  01/24/2023 (Originally 07/26/1984)  . PNA vac Low Risk Adult (2 of 2 - PCV13) 11/10/2023 (Originally 01/23/2010)  . INFLUENZA VACCINE  Completed   Fall Risk  08/28/2016 03/20/2016 09/21/2015  06/15/2015 01/05/2015  Falls in the past year? Yes Yes No No No  Number falls in past yr: 2 or more - - - -  Injury with Fall? Yes Yes - - -  Comment - fracture right knee - - -  Risk Factor Category  - High Fall Risk - - -    Vitals:   12/18/16 1124  BP: 124/68  Pulse: 60  Resp: 20  Temp: (!) 96.9 F (36.1 C)  SpO2: 96%  Weight: 111 lb 4.8 oz (50.5 kg)  Height: 5\' 2"  (1.575 m)   Body mass index is 20.36 kg/m. Physical Exam  Constitutional: She appears well-developed.  Frail elderly in no acute distress.Opens eye to verbal command and tactile.   HENT:  Head: Normocephalic.  Mouth/Throat: Oropharynx is clear and moist. No oropharyngeal exudate.  Eyes: Conjunctivae and EOM are normal. Pupils are equal, round, and reactive to light. Right eye exhibits no discharge. Left eye exhibits no discharge. No scleral icterus.  Neck: Neck supple. No JVD present. No thyromegaly present.  Cardiovascular: Normal rate, regular rhythm, normal heart sounds and intact distal pulses. Exam reveals no gallop and no friction rub.  No murmur heard. Pulmonary/Chest: Effort normal. No respiratory distress. She  has no wheezes. She has no rales. She exhibits no tenderness.  Abdominal: Soft. Bowel sounds are normal. She exhibits no distension. There is no tenderness. There is no rebound and no guarding.  Genitourinary:  Genitourinary Comments: Incontinent for both bowel and bladder.Wears adult size incontinent diapers.   Musculoskeletal: She exhibits no edema or tenderness.  Moves x 4 extremities except right hand contracture.Rigth hand palmer splint protector in place.Wheelchair bound and transfers with assistance.   Lymphadenopathy:    She has no cervical adenopathy.  Neurological: She is alert.  Pleasantly confused at her baseline.  Skin: Skin is warm and dry. No rash noted. No erythema.  Psychiatric: She has a normal mood and affect.   Labs reviewed: Recent Labs    01/21/16 1815  03/18/16 0847   11/22/16 0003 11/23/16 0518 11/30/16  NA  --    < > 137   < > 137 139 142  K  --    < > 4.0   < > 4.3 3.2* 3.9  CL  --    < > 102  --  98* 104  --   CO2  --    < > 26  --  26 22  --   GLUCOSE  --    < > 110*  --  115* 71  --   BUN  --    < > 24*   < > 40* 20 32*  CREATININE  --    < > 1.06*   < > 0.93 0.71 0.62  CALCIUM  --    < > 9.0  --  9.0 8.5* 8.6  MG 1.9  --   --   --   --   --  1.9   < > = values in this interval not displayed.   Recent Labs    03/17/16 0907 11/22/16 0003 11/23/16 0518  AST 18 31 20   ALT 13* 19 16  ALKPHOS 59 90 73  BILITOT 1.1 1.5* 1.0  PROT 6.9 7.4 6.1*  ALBUMIN 4.2 3.7 3.3*   Recent Labs    01/21/16 1412  03/17/16 0907 03/18/16 0431 07/03/16 11/22/16 0003 11/23/16 0518  WBC 10.4   < > 10.0 11.5* 7.0 9.9 8.0  NEUTROABS 8.0*  --  6.8  --   --  7.3  --   HGB 12.0   < > 13.8 13.1 12.3 12.7 12.0  HCT 35.5*   < > 40.5 38.5 36 37.0 36.3  MCV 98.3   < > 96.4 96.3  --  99.7 100.3*  PLT 363   < > 337 355 328 334 329   < > = values in this interval not displayed.   Lab Results  Component Value Date   TSH 3.14 11/13/2016   Lab Results  Component Value Date   HGBA1C 5.5 07/06/2016   Lab Results  Component Value Date   CHOL 168 08/03/2016   HDL 56 08/03/2016   LDLCALC 93 08/03/2016   TRIG 96 08/03/2016   CHOLHDL 3.0 12/11/2014    Significant Diagnostic Results in last 30 days:  Ct Head Wo Contrast  Result Date: 11/22/2016 CLINICAL DATA:  83 y/o  F; intracranial hemorrhage for follow-up. EXAM: CT HEAD WITHOUT CONTRAST TECHNIQUE: Contiguous axial images were obtained from the base of the skull through the vertex without intravenous contrast. COMPARISON:  11/22/2016 CT head. FINDINGS: Brain: Stable right occipital acute hemorrhage measuring 2.2 x 3.7 x 3.8 cm (volume = 16 cm^3)(AP x ML x CC).  Stable small surrounding region of edema and local mass effect with partial effacement of occipital horn of right lateral ventricle. No new acute  hemorrhage or infarct identified. Stable background of chronic microvascular ischemic changes and parenchymal volume loss of the brain. Stable small chronic lacunar infarcts within left lentiform nucleus and caudate body. Stable calcifications of falx and tentorium cerebelli. Vascular: Calcific atherosclerosis of carotid siphons. No hyperdense vessel identified. Skull: Normal. Negative for fracture or focal lesion. Sinuses/Orbits: Partial opacification of right posterior ethmoid air cells. Otherwise negative. Other: Right intra-ocular lens replacement. IMPRESSION: 1. Stable acute hemorrhage in the right occipital lobe as well as associated edema and local mass effect. 2. No new acute intracranial abnormality identified. 3. Stable chronic microvascular ischemic changes and parenchymal volume loss of the brain. Electronically Signed   By: Kristine Garbe M.D.   On: 11/22/2016 18:04   Ct Head Wo Contrast  Result Date: 11/22/2016 CLINICAL DATA:  Altered mental status EXAM: CT HEAD WITHOUT CONTRAST TECHNIQUE: Contiguous axial images were obtained from the base of the skull through the vertex without intravenous contrast. COMPARISON:  Head CT 03/17/2016 FINDINGS: Brain: There is acute hemorrhage within the right occipital lobe. The intraparenchymal hematoma measures 2.3 x 3.7 x 3.8 cm (volume = 17 cm^3). There is mild surrounding edema. Calcifications within the basal ganglia and along the surface of the left tentorium cerebelli are unchanged. There is diffuse atrophy and chronic white matter hypoattenuation consistent with chronic ischemic microangiopathy. No midline shift or other mass effect. No hydrocephalus. No extra-axial extension of hemorrhage. Vascular: Atherosclerotic calcification of the vertebral and internal carotid arteries at the skull base. Skull: Normal visualized skull base, calvarium and extracranial soft tissues. Sinuses/Orbits: No sinus fluid levels or advanced mucosal thickening. No  mastoid effusion. Normal orbits. IMPRESSION: 1. Right occipital intraparenchymal hematoma measuring 17 mL without associated mass effect or hydrocephalus. The location in a patient of this age is suggestive of hemorrhage secondary to amyloid angiopathy. This could be further assessed with MRI and, if necessary. 2. Advanced atrophy and chronic ischemic microangiopathic changes. Critical Value/emergent results were called by telephone at the time of interpretation on 11/22/2016 at 2:14 am to Dr. Thayer Jew , who verbally acknowledged these results. Electronically Signed   By: Ulyses Jarred M.D. On: 11/22/2016 02:14   Assessment/Plan 1. Essential hypertension B/p stable.continue on metoprolol 25mg  tablet in the morning and 12.5 mg tablet daily at bedtime.continue to monitor.    2. Hypothyroidism Lab Results  Component Value Date   TSH 3.14 11/13/2016  Continue on levothyroxine 25 mcg tablet daily.Monitor TSH level.   3. Chronic depression Stable.Continue on mirtazapine 15 mg tablet and citalopram 20 mg tablet daily.monitor for mood changes.   4. Alzheimer's dementia with behavioral disturbance No new behavioral issues reported.Continue on Ativan 0.25 mg tablet every 8 hours as needed.Continue to assist with ADL's.Continue on nutrition supplements.Fall and safety precautions.Follow up with palliative care.   Family/ staff Communication: Reviewed plan of care with patient and facility Nurse.   Labs/tests ordered: None   Ramsay Bognar C Leili Eskenazi, NP

## 2016-12-20 DIAGNOSIS — R1311 Dysphagia, oral phase: Secondary | ICD-10-CM | POA: Diagnosis not present

## 2016-12-20 DIAGNOSIS — I6910 Unspecified sequelae of nontraumatic intracerebral hemorrhage: Secondary | ICD-10-CM | POA: Diagnosis not present

## 2016-12-20 DIAGNOSIS — M6281 Muscle weakness (generalized): Secondary | ICD-10-CM | POA: Diagnosis not present

## 2016-12-20 DIAGNOSIS — R29898 Other symptoms and signs involving the musculoskeletal system: Secondary | ICD-10-CM | POA: Diagnosis not present

## 2016-12-20 DIAGNOSIS — M6530 Trigger finger, unspecified finger: Secondary | ICD-10-CM | POA: Diagnosis not present

## 2016-12-20 DIAGNOSIS — M24541 Contracture, right hand: Secondary | ICD-10-CM | POA: Diagnosis not present

## 2016-12-21 DIAGNOSIS — I6910 Unspecified sequelae of nontraumatic intracerebral hemorrhage: Secondary | ICD-10-CM | POA: Diagnosis not present

## 2016-12-21 DIAGNOSIS — R29898 Other symptoms and signs involving the musculoskeletal system: Secondary | ICD-10-CM | POA: Diagnosis not present

## 2016-12-21 DIAGNOSIS — M24541 Contracture, right hand: Secondary | ICD-10-CM | POA: Diagnosis not present

## 2016-12-21 DIAGNOSIS — R1311 Dysphagia, oral phase: Secondary | ICD-10-CM | POA: Diagnosis not present

## 2016-12-21 DIAGNOSIS — M6530 Trigger finger, unspecified finger: Secondary | ICD-10-CM | POA: Diagnosis not present

## 2016-12-21 DIAGNOSIS — M6281 Muscle weakness (generalized): Secondary | ICD-10-CM | POA: Diagnosis not present

## 2016-12-22 DIAGNOSIS — R1311 Dysphagia, oral phase: Secondary | ICD-10-CM | POA: Diagnosis not present

## 2016-12-22 DIAGNOSIS — M6530 Trigger finger, unspecified finger: Secondary | ICD-10-CM | POA: Diagnosis not present

## 2016-12-22 DIAGNOSIS — I6910 Unspecified sequelae of nontraumatic intracerebral hemorrhage: Secondary | ICD-10-CM | POA: Diagnosis not present

## 2016-12-22 DIAGNOSIS — R29898 Other symptoms and signs involving the musculoskeletal system: Secondary | ICD-10-CM | POA: Diagnosis not present

## 2016-12-22 DIAGNOSIS — M24541 Contracture, right hand: Secondary | ICD-10-CM | POA: Diagnosis not present

## 2016-12-22 DIAGNOSIS — M6281 Muscle weakness (generalized): Secondary | ICD-10-CM | POA: Diagnosis not present

## 2016-12-26 DIAGNOSIS — F329 Major depressive disorder, single episode, unspecified: Secondary | ICD-10-CM | POA: Diagnosis not present

## 2016-12-26 DIAGNOSIS — M6530 Trigger finger, unspecified finger: Secondary | ICD-10-CM | POA: Diagnosis not present

## 2016-12-26 DIAGNOSIS — H9190 Unspecified hearing loss, unspecified ear: Secondary | ICD-10-CM | POA: Diagnosis not present

## 2016-12-26 DIAGNOSIS — I10 Essential (primary) hypertension: Secondary | ICD-10-CM | POA: Diagnosis not present

## 2016-12-26 DIAGNOSIS — E876 Hypokalemia: Secondary | ICD-10-CM | POA: Diagnosis not present

## 2016-12-26 DIAGNOSIS — G459 Transient cerebral ischemic attack, unspecified: Secondary | ICD-10-CM | POA: Diagnosis not present

## 2016-12-26 DIAGNOSIS — M79643 Pain in unspecified hand: Secondary | ICD-10-CM | POA: Diagnosis not present

## 2016-12-26 DIAGNOSIS — R1311 Dysphagia, oral phase: Secondary | ICD-10-CM | POA: Diagnosis not present

## 2016-12-26 DIAGNOSIS — R269 Unspecified abnormalities of gait and mobility: Secondary | ICD-10-CM | POA: Diagnosis not present

## 2016-12-26 DIAGNOSIS — G4700 Insomnia, unspecified: Secondary | ICD-10-CM | POA: Diagnosis not present

## 2016-12-26 DIAGNOSIS — F411 Generalized anxiety disorder: Secondary | ICD-10-CM | POA: Diagnosis not present

## 2016-12-26 DIAGNOSIS — R32 Unspecified urinary incontinence: Secondary | ICD-10-CM | POA: Diagnosis not present

## 2017-01-03 ENCOUNTER — Non-Acute Institutional Stay (SKILLED_NURSING_FACILITY): Payer: Medicare Other | Admitting: Family

## 2017-01-03 ENCOUNTER — Encounter: Payer: Self-pay | Admitting: Family

## 2017-01-03 DIAGNOSIS — R451 Restlessness and agitation: Secondary | ICD-10-CM | POA: Diagnosis not present

## 2017-01-03 NOTE — Progress Notes (Signed)
Location:  [facility] Room Number: 64 Place of Service:  SNF (31) Provider: Iness Pangilinan FNP-C  Blanchie Serve, MD  Patient Care Team: Blanchie Serve, MD as PCP - General (Internal Medicine) Melina Modena, [facility] Gaynelle Arabian, MD as Consulting Physician (Orthopedic Surgery) Hermila Millis, Nelda Bucks, NP as Nurse Practitioner (Family Medicine)  Extended Emergency Contact Information Primary Emergency Contact: [place] of [place] Mobile Phone: [phone] Relation: Daughter Secondary Emergency Contact: Kerby Less [place] of [place] Phone: [phone] Relation: Daughter  Code Status:  DNR Goals of care: Advanced Directive information Advanced Directives 01/03/2017  Does Patient Have a Medical Advance Directive? Yes  Type of Paramedic of [place];Out of facility DNR (pink MOST or yellow form);Living will  Does patient want to make changes to medical advance directive? -  Copy of [place] in Chart? Yes  Pre-existing out of facility DNR order (yellow form or pink MOST form) Yellow form placed in chart (order not valid for inpatient use);Pink MOST form placed in chart (order not valid for inpatient use)     Chief Complaint  Patient presents with  . Acute Visit    Behavior issues    HPI:  Pt is a 83 y.o. female seen today at [facility] for an acute visit for evaluation of increased agitation. She is seen in her room per facility Nurse request.Nurse reports patient was found on floor mat bedside her bed. She required one dose of haldol I.M. due to agitation.No signs or symptoms of urinary tract infections reported. She continues to wonder around on facility hallways. Wander guard in place. She is pleasantly confused during visit unable to provide HPI and ROS information.    Past Medical History:  Diagnosis Date  . Abnormality of gait 03/15/2010  . Anxiety state, unspecified  03/15/2010  . Depression, major 06/15/2015  . Depressive disorder, not elsewhere classified 03/15/2010  . Dysphagia, unspecified(787.20) 03/15/2010  . Generalized anxiety disorder 06/23/2014  . Herpes zoster without mention of complication 9/98/3382  . Other alteration of consciousness 03/15/2010  . Pain in hand 08/13/12  . Personal history of fall 03/15/2010  . Senile osteoporosis 03/15/2010  . Unspecified constipation 03/15/2010  . Unspecified essential hypertension 03/15/2010  . Unspecified hearing loss 08/22/2011  . Unspecified urinary incontinence 03/15/2010  . Urinary incontinence 03/15/2010  . Vaginal prolapse 06/15/2015   Past Surgical History:  Procedure Laterality Date  . INCISION / DRAINAGE HAND / FINGER Right 06/2008   hand  . SPINE SURGERY  12/2006    Allergies  Allergen Reactions  . Ace Inhibitors Other (See Comments)    Reaction unknown/ listed on MAR  . Hydrochlorothiazide Other (See Comments)    Reaction unknown/ listed on MAR  . Morphine And Related Other (See Comments)    Hallucinations    Outpatient Encounter Medications as of 01/03/2017  Medication Sig  . acetaminophen (TYLENOL) 325 MG tablet Take 2 tablets (650 mg total) by mouth every 6 (six) hours as needed for mild pain (or Fever >/= 101).  Marland Kitchen acetaminophen (TYLENOL) 500 MG tablet Take 1,000 mg by mouth at bedtime.  . Calcium Carbonate-Vitamin D (CALCIUM 600+D) 600-200 MG-UNIT TABS Take 1 tablet by mouth daily.   . citalopram (CELEXA) 20 MG tablet Take 20 mg by mouth daily.  . cyanocobalamin 1000 MCG tablet Take 1,000 mcg by mouth daily.   . hydrocortisone (PROCTO-MED HC) 2.5 % rectal cream Place 1 application rectally every 12 (twelve) hours as  needed for hemorrhoids or itching.  . levothyroxine (SYNTHROID, LEVOTHROID) 25 MCG tablet Take 25 mcg by mouth daily before breakfast.  . LORazepam (ATIVAN) 0.5 MG tablet Take 0.25 mg every 8 (eight) hours as needed by mouth for anxiety.  . metoprolol tartrate (LOPRESSOR)  25 MG tablet Take 12.5 mg by mouth at bedtime.  . metoprolol tartrate (LOPRESSOR) 25 MG tablet Take 25 mg every morning by mouth.   . mineral oil-hydrophilic petrolatum (AQUAPHOR) ointment Apply 1 application topically daily as needed for dry skin.  . mirtazapine (REMERON) 15 MG tablet Take 15 mg by mouth at bedtime.  . Nutritional Supplements (RESOURCE 2.0) LIQD Take 120 mLs by mouth 2 (two) times daily between meals. At 10AM and 4PM  . polyethylene glycol (MIRALAX / GLYCOLAX) packet Take 17 g by mouth daily.  . risperiDONE (RISPERDAL) 0.5 MG tablet Take 0.25 mg by mouth daily. At [facility]   No facility-administered encounter medications on file as of 01/03/2017.     Review of Systems  Unable to perform ROS: Dementia    Immunization History  Administered Date(s) Administered  . Influenza Whole 10/24/2011, 10/24/2012  . Influenza-Unspecified 11/06/2013, 11/04/2015, 11/02/2016  . PPD Test 07/27/2009, 12/28/2014, 01/11/2015  . Pneumococcal Polysaccharide-23 01/23/2009  . Pneumococcal-Unspecified 01/23/2005  . Td 07/14/2009   Pertinent  Health Maintenance Due  Topic Date Due  . DEXA SCAN  01/24/2023 (Originally 07/26/1984)  . PNA vac Low Risk Adult (2 of 2 - PCV13) 11/10/2023 (Originally 01/23/2010)  . INFLUENZA VACCINE  Completed   Fall Risk  08/28/2016 03/20/2016 09/21/2015 06/15/2015 01/05/2015  Falls in the past year? Yes Yes No No No  Number falls in past yr: 2 or more - - - -  Injury with Fall? Yes Yes - - -  Comment - fracture right knee - - -  Risk Factor Category  - High Fall Risk - - -    Vitals:   01/03/17 1508  BP: (!) 154/88  Pulse: 70  Resp: 20  Temp: (!) 97.3 F (36.3 C)  SpO2: 95%  Weight: 111 lb 8 oz (50.6 kg)  Height: 5\' 2"  (1.575 m)   Body mass index is 20.39 kg/m. Physical Exam  Constitutional: She appears well-developed.  Frail elder in no acute distress   HENT:  Head: Normocephalic.  Mouth/Throat: Oropharynx is clear and moist. No oropharyngeal exudate.    HOH   Eyes: Conjunctivae and EOM are normal. Pupils are equal, round, and reactive to light. Right eye exhibits no discharge. Left eye exhibits no discharge. No scleral icterus.  Neck: Normal range of motion. No JVD present. No thyromegaly present.  Cardiovascular: Normal rate, regular rhythm and intact distal pulses. Exam reveals no gallop and no friction rub.  No murmur heard. Pulmonary/Chest: Effort normal and breath sounds normal. No respiratory distress. She has no wheezes. She has no rales.  Abdominal: Soft. Bowel sounds are normal. She exhibits no distension. There is no tenderness. There is no rebound and no guarding.  Musculoskeletal: She exhibits no edema or tenderness.  Wheelchair bound.Moves x 4 extremities except right 3rd and 4 th finger contracture.    Lymphadenopathy:    She has no cervical adenopathy.  Neurological: Coordination normal.  Pleasantly confused at her baseline.   Skin: Skin is warm and dry. No rash noted. No erythema. No pallor.  Psychiatric: She has a normal mood and affect.   Labs reviewed: Recent Labs    01/21/16 1815  03/18/16 0847  11/22/16 0003 11/23/16 0518 11/30/16  NA  --    < > 137   < > 137 139 142  K  --    < > 4.0   < > 4.3 3.2* 3.9  CL  --    < > 102  --  98* 104  --   CO2  --    < > 26  --  26 22  --   GLUCOSE  --    < > 110*  --  115* 71  --   BUN  --    < > 24*   < > 40* 20 32*  CREATININE  --    < > 1.06*   < > 0.93 0.71 0.62  CALCIUM  --    < > 9.0  --  9.0 8.5* 8.6  MG 1.9  --   --   --   --   --  1.9   < > = values in this interval not displayed.   Recent Labs    03/17/16 0907 11/22/16 0003 11/23/16 0518  AST 18 31 20   ALT 13* 19 16  ALKPHOS 59 90 73  BILITOT 1.1 1.5* 1.0  PROT 6.9 7.4 6.1*  ALBUMIN 4.2 3.7 3.3*   Recent Labs    01/21/16 1412  03/17/16 0907 03/18/16 0431 07/03/16 11/22/16 0003 11/23/16 0518  WBC 10.4   < > 10.0 11.5* 7.0 9.9 8.0  NEUTROABS 8.0*  --  6.8  --   --  7.3  --   HGB 12.0   < > 13.8  13.1 12.3 12.7 12.0  HCT 35.5*   < > 40.5 38.5 36 37.0 36.3  MCV 98.3   < > 96.4 96.3  --  99.7 100.3*  PLT 363   < > 337 355 328 334 329   < > = values in this interval not displayed.   Lab Results  Component Value Date   TSH 3.14 11/13/2016   Lab Results  Component Value Date   HGBA1C 5.5 07/06/2016   Lab Results  Component Value Date   CHOL 168 08/03/2016   HDL 56 08/03/2016   LDLCALC 93 08/03/2016   TRIG 96 08/03/2016   CHOLHDL 3.0 12/11/2014    Significant Diagnostic Results in last 30 days:  No results found.  Assessment/Plan  Agitation Afebrile.Currently on Ativan 0.5 mg tablet every 8 hours as needed and Risperdal 0.25 mg tablet once daily at 2 Pm.she recently had X 1 dose of Haldol I.M due to increased agitation. Will increase Risperdal to 0.5 mg Tablet once daily at 2 pm. Continue to nonpharmacologic measures.continue to monitor.   Family/ staff Communication: Reviewed plan of care with patient and facility Nurse supervisor  Labs/tests ordered: None   Sandrea Hughs, NP

## 2017-01-04 DIAGNOSIS — E138 Other specified diabetes mellitus with unspecified complications: Secondary | ICD-10-CM | POA: Diagnosis not present

## 2017-01-04 DIAGNOSIS — I10 Essential (primary) hypertension: Secondary | ICD-10-CM | POA: Diagnosis not present

## 2017-01-04 LAB — HEMOGLOBIN A1C: Hemoglobin A1C: 5.5

## 2017-01-22 ENCOUNTER — Non-Acute Institutional Stay (SKILLED_NURSING_FACILITY): Payer: Medicare Other | Admitting: Internal Medicine

## 2017-01-22 ENCOUNTER — Encounter: Payer: Self-pay | Admitting: Internal Medicine

## 2017-01-22 DIAGNOSIS — R296 Repeated falls: Secondary | ICD-10-CM | POA: Diagnosis not present

## 2017-01-22 DIAGNOSIS — G309 Alzheimer's disease, unspecified: Secondary | ICD-10-CM

## 2017-01-22 DIAGNOSIS — E039 Hypothyroidism, unspecified: Secondary | ICD-10-CM | POA: Diagnosis not present

## 2017-01-22 DIAGNOSIS — F01518 Vascular dementia, unspecified severity, with other behavioral disturbance: Secondary | ICD-10-CM

## 2017-01-22 DIAGNOSIS — F339 Major depressive disorder, recurrent, unspecified: Secondary | ICD-10-CM | POA: Diagnosis not present

## 2017-01-22 DIAGNOSIS — F0151 Vascular dementia with behavioral disturbance: Secondary | ICD-10-CM | POA: Diagnosis not present

## 2017-01-22 DIAGNOSIS — R001 Bradycardia, unspecified: Secondary | ICD-10-CM

## 2017-01-22 DIAGNOSIS — F02818 Dementia in other diseases classified elsewhere, unspecified severity, with other behavioral disturbance: Secondary | ICD-10-CM

## 2017-01-22 DIAGNOSIS — F0281 Dementia in other diseases classified elsewhere with behavioral disturbance: Secondary | ICD-10-CM | POA: Diagnosis not present

## 2017-01-22 NOTE — Progress Notes (Signed)
Location:  [place] Room Number: 31 Place of Service:  SNF [phone]) Provider:  Blanchie Serve MD  Blanchie Serve, MD  Patient Care Team: Blanchie Serve, MD as PCP - General (Internal Medicine) Melina Modena, [facility] Gaynelle Arabian, MD as Consulting Physician (Orthopedic Surgery) Ngetich, Nelda Bucks, NP as Nurse Practitioner (Family Medicine)  Extended Emergency Contact Information Primary Emergency Contact: [place] Mobile Phone: [phone] Relation: Daughter Secondary Emergency Contact: Kerby [place] [employer] Phone: [phone] Relation: Daughter  Code Status:  DNR  Goals of care: Advanced Directive information Advanced Directives 01/03/2017  Does Patient Have a Medical Advance Directive? Yes  Type of Paramedic of [place];Out of facility DNR (pink MOST or yellow form);Living will  Does patient want to make changes to medical advance directive? -  Copy of [place] in Chart? Yes  Pre-existing out of facility DNR order (yellow form or pink MOST form) Yellow form placed in chart (order not valid for inpatient use);Pink MOST form placed in chart (order not valid for inpatient use)     Chief Complaint  Patient presents with  . Medical Management of Chronic Issues    Routine Visit    HPI:  Pt is a 83 y.o. female seen today for medical management of chronic diseases. She has advanced dementia and does not participate in HPI and ROS. Per nursing, she sleeps until late in the morning and wakes up for lunch. Late afternoon, she has episodes of increased confusion and agitation about 1-3 times a week. She is found talking to her mom or she tells staff that she has to get to the bank and take care of IRS paperwork. She feeds herself most of the time after setting her tray up. She ambulates in her wheelchair and has to be wheeled around. She had a fall on 12/28 where she was  found at bedside on the mat. No apparent injury reported. She had another fall on 12/19 where she was found on the floor. On chart review, her HR has been in 50s during this. One BP reading of 104/64.     Past Medical History:  Diagnosis Date  . Abnormality of gait 03/15/2010  . Anxiety state, unspecified 03/15/2010  . Depression, major 06/15/2015  . Depressive disorder, not elsewhere classified 03/15/2010  . Dysphagia, unspecified(787.20) 03/15/2010  . Generalized anxiety disorder 06/23/2014  . Herpes zoster without mention of complication 6/43/3295  . Other alteration of consciousness 03/15/2010  . Pain in hand 08/13/12  . Personal history of fall 03/15/2010  . Senile osteoporosis 03/15/2010  . Unspecified constipation 03/15/2010  . Unspecified essential hypertension 03/15/2010  . Unspecified hearing loss 08/22/2011  . Unspecified urinary incontinence 03/15/2010  . Urinary incontinence 03/15/2010  . Vaginal prolapse 06/15/2015   Past Surgical History:  Procedure Laterality Date  . INCISION / DRAINAGE HAND / FINGER Right 06/2008   hand  . SPINE SURGERY  12/2006    Allergies  Allergen Reactions  . Ace Inhibitors Other (See Comments)    Reaction unknown/ listed on MAR  . Hydrochlorothiazide Other (See Comments)    Reaction unknown/ listed on MAR  . Morphine And Related Other (See Comments)    Hallucinations    Outpatient Encounter Medications as of 01/22/2017  Medication Sig  . acetaminophen (TYLENOL) 325 MG tablet Take 2 tablets (650 mg total) by mouth every 6 (six) hours as needed for mild pain (or Fever >/= 101).  Marland Kitchen  acetaminophen (TYLENOL) 500 MG tablet Take 1,000 mg by mouth at bedtime.  . Calcium Carbonate-Vitamin D (CALCIUM 600+D) 600-200 MG-UNIT TABS Take 1 tablet by mouth daily.   . citalopram (CELEXA) 20 MG tablet Take 20 mg by mouth daily.  . cyanocobalamin 1000 MCG tablet Take 1,000 mcg by mouth daily.   . hydrocortisone (PROCTO-MED HC) 2.5 % rectal cream Place 1 application  rectally every 12 (twelve) hours as needed for hemorrhoids or itching.  . levothyroxine (SYNTHROID, LEVOTHROID) 25 MCG tablet Take 25 mcg by mouth daily before breakfast.  . LORazepam (ATIVAN) 0.5 MG tablet Take 0.25 mg by mouth 2 (two) times daily.  . metoprolol tartrate (LOPRESSOR) 25 MG tablet Take 12.5 mg by mouth at bedtime.  . metoprolol tartrate (LOPRESSOR) 25 MG tablet Take 25 mg every morning by mouth.   . mineral oil-hydrophilic petrolatum (AQUAPHOR) ointment Apply 1 application topically daily as needed for dry skin.  . mirtazapine (REMERON) 15 MG tablet Take 15 mg by mouth at bedtime.  . Nutritional Supplements (RESOURCE 2.0) LIQD Take 120 mLs by mouth 2 (two) times daily between meals. At 10AM and 4PM  . polyethylene glycol (MIRALAX / GLYCOLAX) packet Take 17 g by mouth daily.  . risperiDONE (RISPERDAL) 0.5 MG tablet Take 0.25 mg by mouth daily. At [facility]   No facility-administered encounter medications on file as of 01/22/2017.     Review of Systems  Unable to perform ROS: Dementia    Immunization History  Administered Date(s) Administered  . Influenza Whole 10/24/2011, 10/24/2012  . Influenza-Unspecified 11/06/2013, 11/04/2015, 11/02/2016  . PPD Test 07/27/2009, 12/28/2014, 01/11/2015  . Pneumococcal Polysaccharide-23 01/23/2009  . Pneumococcal-Unspecified 01/23/2005  . Td 07/14/2009   Pertinent  Health Maintenance Due  Topic Date Due  . DEXA SCAN  01/24/2023 (Originally 07/26/1984)  . PNA vac Low Risk Adult (2 of 2 - PCV13) 11/10/2023 (Originally 01/23/2010)  . INFLUENZA VACCINE  Completed   Fall Risk  08/28/2016 03/20/2016 09/21/2015 06/15/2015 01/05/2015  Falls in the past year? Yes Yes No No No  Number falls in past yr: 2 or more - - - -  Injury with Fall? Yes Yes - - -  Comment - fracture right knee - - -  Risk Factor Category  - High Fall Risk - - -   Functional Status Survey:    Vitals:   01/22/17 1145  BP: (!) 146/75  Pulse: 61  Resp: 14  Temp: (!) 96.6 F  (35.9 C)  TempSrc: Oral  SpO2: 98%  Weight: 109 lb 8 oz (49.7 kg)  Height: 5\' 2"  (1.575 m)   Body mass index is 20.03 kg/m.   Wt Readings from Last 3 Encounters:  01/22/17 109 lb 8 oz (49.7 kg)  01/03/17 111 lb 8 oz (50.6 kg)  12/18/16 111 lb 4.8 oz (50.5 kg)   Physical Exam  Constitutional: No distress.  Thin built, frail  HENT:  Head: Normocephalic and atraumatic.  Mouth/Throat: Oropharynx is clear and moist.  Hard of hearing  Eyes: Conjunctivae and EOM are normal. Pupils are equal, round, and reactive to light. Right eye exhibits no discharge. Left eye exhibits no discharge. No scleral icterus.  Neck: Neck supple.  Cardiovascular: Normal rate and regular rhythm.  Pulmonary/Chest: Effort normal and breath sounds normal. She has no wheezes. She has no rales.  Abdominal: Soft. Bowel sounds are normal. There is no tenderness.  Musculoskeletal: She exhibits deformity. She exhibits no edema or tenderness.  Lymphadenopathy:    She has no cervical  adenopathy.  Neurological:  Alert ad oriented only to self  Skin: Skin is warm and dry. No rash noted. She is not diaphoretic.  Psychiatric:  Pleasantly confused this visit    Labs reviewed: Recent Labs    03/18/16 0847  11/22/16 0003 11/23/16 0518 11/30/16  NA 137   < > 137 139 142  142  K 4.0   < > 4.3 3.2* 3.9  3.9  CL 102  --  98* 104  --   CO2 26  --  26 22  --   GLUCOSE 110*  --  115* 71  --   BUN 24*   < > 40* 20 32*  32*  CREATININE 1.06*   < > 0.93 0.71 0.6  0.62  CALCIUM 9.0  --  9.0 8.5* 8.6  MG  --   --   --   --  1.9   < > = values in this interval not displayed.   Recent Labs    03/17/16 0907 11/22/16 0003 11/23/16 0518  AST 18 31 20   ALT 13* 19 16  ALKPHOS 59 90 73  BILITOT 1.1 1.5* 1.0  PROT 6.9 7.4 6.1*  ALBUMIN 4.2 3.7 3.3*   Recent Labs    03/17/16 0907 03/18/16 0431 07/03/16 11/22/16 0003 11/23/16 0518  WBC 10.0 11.5* 7.0 9.9 8.0  NEUTROABS 6.8  --   --  7.3  --   HGB 13.8 13.1  12.3 12.7 12.0  HCT 40.5 38.5 36 37.0 36.3  MCV 96.4 96.3  --  99.7 100.3*  PLT 337 355 328 334 329   Lab Results  Component Value Date   TSH 3.14 11/13/2016   Lab Results  Component Value Date   HGBA1C 5.5 01/04/2017   Lab Results  Component Value Date   CHOL 168 08/03/2016   HDL 56 08/03/2016   LDLCALC 93 08/03/2016   TRIG 96 08/03/2016   CHOLHDL 3.0 12/11/2014    Significant Diagnostic Results in last 30 days:  No results found.  Assessment/Plan  Multiple falls She is very high fall risk with her unsteady gait, advanced dementia and poor safety awareness. She was in the hospital last month post fall with intracranial bleed. On chart review, few low BP and multiple low HR readings. Changes as below.  Bradycardia Currently on metoprolol tartrate 25 mg daily and 12.5 mg qhs. Likely iatrogenic with her metoprolol. Decrease this to 12.5 mg bid. Monitor.   Hypothyroidism Lab Results  Component Value Date   TSH 3.14 11/13/2016   Continue levothyroxine 25 mcg daily  Advanced dementia with behavioral disturbance Decline anticipated. Has episodes of delusion, agitation and depression along with anxiety. Currently on risperdal 0.5 mg daily and ativan 0.25 mg bid. Also on citalopram and remeron. Increase citalopram to 30 mg daily to help with behavioral changes with her dementia. If this helps, will slowly taper off risperdal.   Depression and anxiety Currently on citalopram 20 mg daily and remeron 15 mg daily. Increase citalopram to 30 mg daily and monitor  Family/ staff Communication: reviewed care plan with patient and charge nurse.    Labs/tests ordered:  none   Blanchie Serve, MD Internal Medicine [facility] [address] [place], [place] [address] Cell Phone (Monday-Friday 8 am - 5 pm): [phone] On Call: [phone] and follow prompts after 5 pm and on weekends Office Phone: [phone] Office Fax: [phone]

## 2017-01-25 ENCOUNTER — Encounter (HOSPITAL_COMMUNITY): Payer: Self-pay | Admitting: Emergency Medicine

## 2017-01-25 ENCOUNTER — Inpatient Hospital Stay (HOSPITAL_COMMUNITY)
Admission: EM | Admit: 2017-01-25 | Discharge: 2017-02-23 | DRG: 551 | Disposition: E | Payer: Medicare Other | Attending: Internal Medicine | Admitting: Internal Medicine

## 2017-01-25 ENCOUNTER — Emergency Department (HOSPITAL_COMMUNITY): Payer: Medicare Other

## 2017-01-25 ENCOUNTER — Other Ambulatory Visit: Payer: Self-pay

## 2017-01-25 DIAGNOSIS — R001 Bradycardia, unspecified: Secondary | ICD-10-CM | POA: Diagnosis present

## 2017-01-25 DIAGNOSIS — E854 Organ-limited amyloidosis: Secondary | ICD-10-CM | POA: Diagnosis present

## 2017-01-25 DIAGNOSIS — S12100A Unspecified displaced fracture of second cervical vertebra, initial encounter for closed fracture: Secondary | ICD-10-CM | POA: Diagnosis not present

## 2017-01-25 DIAGNOSIS — R52 Pain, unspecified: Secondary | ICD-10-CM

## 2017-01-25 DIAGNOSIS — S12121A Other nondisplaced dens fracture, initial encounter for closed fracture: Principal | ICD-10-CM | POA: Diagnosis present

## 2017-01-25 DIAGNOSIS — M810 Age-related osteoporosis without current pathological fracture: Secondary | ICD-10-CM | POA: Diagnosis present

## 2017-01-25 DIAGNOSIS — S12120A Other displaced dens fracture, initial encounter for closed fracture: Secondary | ICD-10-CM | POA: Insufficient documentation

## 2017-01-25 DIAGNOSIS — W19XXXD Unspecified fall, subsequent encounter: Secondary | ICD-10-CM | POA: Diagnosis not present

## 2017-01-25 DIAGNOSIS — F0280 Dementia in other diseases classified elsewhere without behavioral disturbance: Secondary | ICD-10-CM | POA: Diagnosis not present

## 2017-01-25 DIAGNOSIS — Z79890 Hormone replacement therapy (postmenopausal): Secondary | ICD-10-CM

## 2017-01-25 DIAGNOSIS — S0031XA Abrasion of nose, initial encounter: Secondary | ICD-10-CM | POA: Diagnosis present

## 2017-01-25 DIAGNOSIS — F02818 Dementia in other diseases classified elsewhere, unspecified severity, with other behavioral disturbance: Secondary | ICD-10-CM | POA: Diagnosis present

## 2017-01-25 DIAGNOSIS — Z8249 Family history of ischemic heart disease and other diseases of the circulatory system: Secondary | ICD-10-CM

## 2017-01-25 DIAGNOSIS — H9190 Unspecified hearing loss, unspecified ear: Secondary | ICD-10-CM | POA: Diagnosis present

## 2017-01-25 DIAGNOSIS — S0181XA Laceration without foreign body of other part of head, initial encounter: Secondary | ICD-10-CM | POA: Diagnosis present

## 2017-01-25 DIAGNOSIS — S4991XA Unspecified injury of right shoulder and upper arm, initial encounter: Secondary | ICD-10-CM | POA: Diagnosis not present

## 2017-01-25 DIAGNOSIS — R402142 Coma scale, eyes open, spontaneous, at arrival to emergency department: Secondary | ICD-10-CM | POA: Diagnosis present

## 2017-01-25 DIAGNOSIS — G4700 Insomnia, unspecified: Secondary | ICD-10-CM | POA: Diagnosis present

## 2017-01-25 DIAGNOSIS — E43 Unspecified severe protein-calorie malnutrition: Secondary | ICD-10-CM | POA: Diagnosis present

## 2017-01-25 DIAGNOSIS — I10 Essential (primary) hypertension: Secondary | ICD-10-CM | POA: Diagnosis present

## 2017-01-25 DIAGNOSIS — S12091A Other nondisplaced fracture of first cervical vertebra, initial encounter for closed fracture: Secondary | ICD-10-CM | POA: Diagnosis present

## 2017-01-25 DIAGNOSIS — F411 Generalized anxiety disorder: Secondary | ICD-10-CM | POA: Diagnosis present

## 2017-01-25 DIAGNOSIS — F0151 Vascular dementia with behavioral disturbance: Secondary | ICD-10-CM | POA: Diagnosis present

## 2017-01-25 DIAGNOSIS — S2249XA Multiple fractures of ribs, unspecified side, initial encounter for closed fracture: Secondary | ICD-10-CM | POA: Diagnosis present

## 2017-01-25 DIAGNOSIS — S098XXA Other specified injuries of head, initial encounter: Secondary | ICD-10-CM | POA: Diagnosis not present

## 2017-01-25 DIAGNOSIS — G309 Alzheimer's disease, unspecified: Secondary | ICD-10-CM | POA: Diagnosis present

## 2017-01-25 DIAGNOSIS — Z888 Allergy status to other drugs, medicaments and biological substances status: Secondary | ICD-10-CM | POA: Diagnosis not present

## 2017-01-25 DIAGNOSIS — E039 Hypothyroidism, unspecified: Secondary | ICD-10-CM | POA: Diagnosis present

## 2017-01-25 DIAGNOSIS — R296 Repeated falls: Secondary | ICD-10-CM | POA: Diagnosis present

## 2017-01-25 DIAGNOSIS — F0281 Dementia in other diseases classified elsewhere with behavioral disturbance: Secondary | ICD-10-CM | POA: Diagnosis present

## 2017-01-25 DIAGNOSIS — Z885 Allergy status to narcotic agent status: Secondary | ICD-10-CM | POA: Diagnosis not present

## 2017-01-25 DIAGNOSIS — S199XXA Unspecified injury of neck, initial encounter: Secondary | ICD-10-CM | POA: Diagnosis not present

## 2017-01-25 DIAGNOSIS — I680 Cerebral amyloid angiopathy: Secondary | ICD-10-CM | POA: Diagnosis present

## 2017-01-25 DIAGNOSIS — R402212 Coma scale, best verbal response, none, at arrival to emergency department: Secondary | ICD-10-CM | POA: Diagnosis present

## 2017-01-25 DIAGNOSIS — Z515 Encounter for palliative care: Secondary | ICD-10-CM | POA: Insufficient documentation

## 2017-01-25 DIAGNOSIS — F339 Major depressive disorder, recurrent, unspecified: Secondary | ICD-10-CM | POA: Diagnosis present

## 2017-01-25 DIAGNOSIS — Z8673 Personal history of transient ischemic attack (TIA), and cerebral infarction without residual deficits: Secondary | ICD-10-CM

## 2017-01-25 DIAGNOSIS — Z681 Body mass index (BMI) 19 or less, adult: Secondary | ICD-10-CM | POA: Diagnosis not present

## 2017-01-25 DIAGNOSIS — E538 Deficiency of other specified B group vitamins: Secondary | ICD-10-CM | POA: Diagnosis present

## 2017-01-25 DIAGNOSIS — S0990XA Unspecified injury of head, initial encounter: Secondary | ICD-10-CM | POA: Diagnosis not present

## 2017-01-25 DIAGNOSIS — K5900 Constipation, unspecified: Secondary | ICD-10-CM | POA: Diagnosis present

## 2017-01-25 DIAGNOSIS — Z79899 Other long term (current) drug therapy: Secondary | ICD-10-CM

## 2017-01-25 DIAGNOSIS — Y92129 Unspecified place in nursing home as the place of occurrence of the external cause: Secondary | ICD-10-CM

## 2017-01-25 DIAGNOSIS — W19XXXA Unspecified fall, initial encounter: Secondary | ICD-10-CM | POA: Diagnosis present

## 2017-01-25 DIAGNOSIS — S0291XA Unspecified fracture of skull, initial encounter for closed fracture: Secondary | ICD-10-CM | POA: Diagnosis not present

## 2017-01-25 DIAGNOSIS — Z66 Do not resuscitate: Secondary | ICD-10-CM | POA: Diagnosis present

## 2017-01-25 DIAGNOSIS — R402352 Coma scale, best motor response, localizes pain, at arrival to emergency department: Secondary | ICD-10-CM | POA: Diagnosis present

## 2017-01-25 HISTORY — DX: Unspecified dementia, unspecified severity, without behavioral disturbance, psychotic disturbance, mood disturbance, and anxiety: F03.90

## 2017-01-25 HISTORY — DX: Hypothyroidism, unspecified: E03.9

## 2017-01-25 LAB — BASIC METABOLIC PANEL
Anion gap: 8 (ref 5–15)
BUN: 24 mg/dL — ABNORMAL HIGH (ref 6–20)
CALCIUM: 9.2 mg/dL (ref 8.9–10.3)
CHLORIDE: 103 mmol/L (ref 101–111)
CO2: 26 mmol/L (ref 22–32)
CREATININE: 0.71 mg/dL (ref 0.44–1.00)
GFR calc non Af Amer: >60 mL/min (ref >60)
Glucose, Bld: 95 mg/dL (ref 65–99)
Potassium: 4 mmol/L (ref 3.5–5.1)
SODIUM: 137 mmol/L (ref 135–145)

## 2017-01-25 LAB — CBC WITH DIFFERENTIAL/PLATELET
BASOS PCT: 0 %
Basophils Absolute: 0 10*3/uL (ref 0.0–0.1)
EOS ABS: 0.2 10*3/uL (ref 0.0–0.7)
Eosinophils Relative: 2 %
HCT: 37.1 % (ref 36.0–46.0)
HEMOGLOBIN: 12.6 g/dL (ref 12.0–15.0)
LYMPHS ABS: 2.9 10*3/uL (ref 0.7–4.0)
Lymphocytes Relative: 35 %
MCH: 34.3 pg — ABNORMAL HIGH (ref 26.0–34.0)
MCHC: 34 g/dL (ref 30.0–36.0)
MCV: 101.1 fL — ABNORMAL HIGH (ref 78.0–100.0)
Monocytes Absolute: 0.7 10*3/uL (ref 0.1–1.0)
Monocytes Relative: 9 %
NEUTROS PCT: 54 %
Neutro Abs: 4.4 10*3/uL (ref 1.7–7.7)
Platelets: 301 10*3/uL (ref 150–400)
RBC: 3.67 MIL/uL — AB (ref 3.87–5.11)
RDW: 13.9 % (ref 11.5–15.5)
WBC: 8.1 10*3/uL (ref 4.0–10.5)

## 2017-01-25 MED ORDER — NYSTATIN 100000 UNIT/GM EX POWD: Freq: Three times a day (TID) | CUTANEOUS | Status: DC | PRN

## 2017-01-25 MED ORDER — FENTANYL CITRATE (PF) 100 MCG/2ML IJ SOLN
25.0000 ug | Freq: Once | INTRAMUSCULAR | Status: AC
  Administered 2017-01-25: 25 ug via INTRAVENOUS
  Filled 2017-01-25: qty 2

## 2017-01-25 MED ORDER — GLYCOPYRROLATE 0.2 MG/ML IJ SOLN: 0.2000 mg | INTRAMUSCULAR | Status: DC | PRN

## 2017-01-25 MED ORDER — POLYVINYL ALCOHOL 1.4 % OP SOLN
1.0000 [drp] | Freq: Four times a day (QID) | OPHTHALMIC | Status: DC | PRN
  Filled 2017-01-25: qty 15

## 2017-01-25 MED ORDER — BISACODYL 10 MG RE SUPP: 10.0000 mg | Freq: Every day | RECTAL | Status: DC | PRN

## 2017-01-25 MED ORDER — LORAZEPAM 2 MG/ML IJ SOLN
0.5000 mg | INTRAMUSCULAR | Status: DC | PRN
  Administered 2017-01-25: 0.5 mg via INTRAVENOUS
  Filled 2017-01-25: qty 1

## 2017-01-25 MED ORDER — ONDANSETRON HCL 4 MG/2ML IJ SOLN: 4.0000 mg | Freq: Four times a day (QID) | INTRAMUSCULAR | Status: DC | PRN

## 2017-01-25 MED ORDER — SODIUM CHLORIDE 0.9 % IV SOLN
INTRAVENOUS | Status: DC
  Administered 2017-01-25: 13:00:00 via INTRAVENOUS

## 2017-01-25 MED ORDER — BIOTENE DRY MOUTH MT LIQD: 15.0000 mL | OROMUCOSAL | Status: DC | PRN

## 2017-01-25 MED ORDER — LORAZEPAM 2 MG/ML IJ SOLN
0.5000 mg | Freq: Once | INTRAMUSCULAR | Status: AC
  Administered 2017-01-25: 0.5 mg via INTRAVENOUS
  Filled 2017-01-25: qty 1

## 2017-01-25 MED ORDER — BACITRACIN ZINC 500 UNIT/GM EX OINT
1.0000 "application " | TOPICAL_OINTMENT | Freq: Two times a day (BID) | CUTANEOUS | Status: DC
  Administered 2017-01-25 – 2017-01-28 (×8): 1 via TOPICAL
  Filled 2017-01-25: qty 28.35

## 2017-01-25 MED ORDER — ACETAMINOPHEN 650 MG RE SUPP
650.0000 mg | Freq: Four times a day (QID) | RECTAL | Status: DC | PRN
  Administered 2017-01-28: 650 mg via RECTAL
  Filled 2017-01-25 (×2): qty 1

## 2017-01-25 MED ORDER — FENTANYL CITRATE (PF) 100 MCG/2ML IJ SOLN: 25.0000 ug | Freq: Once | INTRAMUSCULAR | Status: DC

## 2017-01-25 MED ORDER — SODIUM CHLORIDE 0.9 % IV SOLN: 12.5000 mg | Freq: Four times a day (QID) | INTRAVENOUS | Status: DC | PRN

## 2017-01-25 MED ORDER — ONDANSETRON 4 MG PO TBDP: 4.0000 mg | ORAL_TABLET | Freq: Four times a day (QID) | ORAL | Status: DC | PRN

## 2017-01-25 MED ORDER — ACETAMINOPHEN 325 MG PO TABS: 650.0000 mg | ORAL_TABLET | Freq: Four times a day (QID) | ORAL | Status: DC | PRN

## 2017-01-25 MED ORDER — DIPHENHYDRAMINE HCL 50 MG/ML IJ SOLN: 12.5000 mg | INTRAMUSCULAR | Status: DC | PRN

## 2017-01-25 MED ORDER — FENTANYL CITRATE (PF) 100 MCG/2ML IJ SOLN
50.0000 ug | Freq: Once | INTRAMUSCULAR | Status: AC
  Administered 2017-01-25: 50 ug via INTRAVENOUS
  Filled 2017-01-25: qty 2

## 2017-01-25 MED ORDER — LIDOCAINE-EPINEPHRINE 2 %-1:200000 IJ SOLN
10.0000 mL | Freq: Once | INTRAMUSCULAR | Status: AC
Start: 2017-01-25 — End: 2017-01-25
  Administered 2017-01-25: 10 mL
  Filled 2017-01-25: qty 20

## 2017-01-25 MED ORDER — LIDOCAINE-EPINEPHRINE-TETRACAINE (LET) SOLUTION
3.0000 mL | Freq: Once | NASAL | Status: AC
  Administered 2017-01-25: 3 mL via TOPICAL
  Filled 2017-01-25: qty 3

## 2017-01-25 MED ORDER — HYDROMORPHONE HCL 1 MG/ML IJ SOLN
0.5000 mg | INTRAMUSCULAR | Status: DC | PRN
  Administered 2017-01-25 – 2017-01-26 (×3): 0.5 mg via INTRAVENOUS
  Filled 2017-01-25 (×4): qty 1

## 2017-01-25 NOTE — ED Notes (Signed)
Patient transported to CT 

## 2017-01-25 NOTE — ED Notes (Signed)
Admitting PA at bedside.

## 2017-01-25 NOTE — ED Notes (Addendum)
Wound care done on patient, dried blood removed.  Bacitracin applied and non-adherent dressing with paper tape applied to head.    During dressing application, patient anxious, moaning asking RN to stop messing with her head, bothered by the BP cuff, moaning occasionally without touching.  Will provide PRN anxiety medication

## 2017-01-25 NOTE — ED Provider Notes (Signed)
[facility] EMERGENCY DEPARTMENT Provider Note   CSN: [id number] Arrival date & time: 02/20/2017  6295     History   Chief Complaint Chief Complaint  Patient presents with  . Fall  . Head Injury    HPI [name] is a 84 y.o. female.  Level V caveat: Dementia   [name] is a 84 y.o. Female who presents to the ED via EMS after an unwitnessed fall prior to arrival. She is from [place]. She was found face down by nursing staff bleeding from her forehead. EMS was concerned for depressed skull fracture. No other visible or palpated signs of trauma per EMS. No anticoagulation use. She has history of dementia and is oriented to self only at baseline. DNR and MOST form with EMS.    The history is provided by the nursing home, the EMS personnel and medical records. No language interpreter was used.  Fall   Head Injury      Past Medical History:  Diagnosis Date  . Abnormality of gait 03/15/2010  . Anxiety state, unspecified 03/15/2010  . Dementia   . Depression, major 06/15/2015  . Depressive disorder, not elsewhere classified 03/15/2010  . Dysphagia, unspecified(787.20) 03/15/2010  . Generalized anxiety disorder 06/23/2014  . Herpes zoster without mention of complication 2/84/1324  . Hypothyroidism   . Other alteration of consciousness 03/15/2010  . Pain in hand 08/13/12  . Personal history of fall 03/15/2010  . Senile osteoporosis 03/15/2010  . Unspecified constipation 03/15/2010  . Unspecified essential hypertension 03/15/2010  . Unspecified hearing loss 08/22/2011  . Unspecified urinary incontinence 03/15/2010  . Urinary incontinence 03/15/2010  . Vaginal prolapse 06/15/2015    Patient Active Problem List   Diagnosis Date Noted  . End of life care 02/01/2017  . Multiple falls 01/22/2017  . Bradycardia 01/22/2017  . 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 Intracranial hemorrhage ([place]) 11/22/2016  .  ICH (intracerebral hemorrhage) ([place]) 11/22/2016  . Cytotoxic brain edema ([place]) 11/22/2016  . Cerebral amyloid angiopathy (CODE) 11/22/2016  . Contracture of finger joint, right 11/17/2016  . Severe protein-calorie malnutrition ([name]) 09/18/2016  . Closed patellar sleeve fracture of right knee 03/17/2016  . Fall 03/17/2016  . Fracture of multiple ribs 03/17/2016  . Vitamin B 12 deficiency 02/15/2016  . Hypothyroidism 12/14/2015  . Rash 09/21/2015  . Urinary tract infection 09/14/2015  . Constipation 06/15/2015  . Urinary frequency 06/15/2015  . Depression, major 06/15/2015  . Vaginal prolapse 06/15/2015  . Edema 04/21/2015  . Hallucinations 12/22/2014  . Speech abnormality   . TIA (transient ischemic attack) 12/10/2014  . Generalized anxiety disorder 06/23/2014  . Hearing loss 06/03/2014  . Insomnia 12/16/2013  . Trigger finger, acquired 03/04/2013  . Pain in hand   . Essential hypertension 03/15/2010  . Urinary incontinence 03/15/2010  . Depression, recurrent ([place]) 03/15/2010    Past Surgical History:  Procedure Laterality Date  . INCISION / DRAINAGE HAND / FINGER Right 06/2008   hand  . SPINE SURGERY  12/2006    OB History    No data available       Home Medications    Prior to Admission medications   Medication Sig Start Date End Date Taking? Authorizing Provider  acetaminophen (TYLENOL) 500 MG tablet Take 1,000 mg by mouth at bedtime.   Yes [provider]  Calcium Carbonate-Vitamin D (CALCIUM 600+D) 600-200 MG-UNIT TABS Take 1 tablet by mouth daily.    Yes [provider]  citalopram (CELEXA) 20 MG tablet Take 20 mg by mouth daily.   Yes [provider]  cyanocobalamin 1000 MCG tablet Take 1,000 mcg by mouth daily.    Yes [provider]  hydrocortisone (PROCTO-MED HC) 2.5 % rectal cream Place 1 application rectally every 12 (twelve) hours as needed for hemorrhoids or itching.   Yes [provider]  levothyroxine  (SYNTHROID, LEVOTHROID) 25 MCG tablet Take 25 mcg by mouth daily before breakfast.   Yes [provider]  LORazepam (ATIVAN) 0.5 MG tablet Take 0.25 mg by mouth 2 (two) times daily.   Yes [provider]  metoprolol tartrate (LOPRESSOR) 25 MG tablet Take 12.5 mg by mouth 2 (two) times daily.    Yes [provider]  mineral oil-hydrophilic petrolatum (AQUAPHOR) ointment Apply 1 application topically daily as needed for dry skin.   Yes [provider]  mirtazapine (REMERON) 15 MG tablet Take 15 mg by mouth at bedtime.   Yes [provider]  Nutritional Supplements (RESOURCE 2.0) LIQD Take 120 mLs by mouth 2 (two) times daily between meals. At [facility] and 4PM   Yes [provider]  polyethylene glycol (MIRALAX / GLYCOLAX) packet Take 17 g by mouth daily. 01/24/16  Yes Eber Jones, MD  risperiDONE (RISPERDAL) 0.5 MG tablet Take 0.5 mg by mouth daily. At [facility]   Yes [provider]  acetaminophen (TYLENOL) 325 MG tablet Take 2 tablets (650 mg total) by mouth every 6 (six) hours as needed for mild pain (or Fever >/= 101). 03/19/16   Oswald Hillock, MD    Family History Family History  Problem Relation Age of Onset  . Heart disease Father        MI    Social History Social History   Tobacco Use  . Smoking status: Never Smoker  . Smokeless tobacco: Never Used  Substance Use Topics  . Alcohol use: Yes    Comment: 2-4 oz of wine once daily at request  . Drug use: No     Allergies   Ace inhibitors; Hydrochlorothiazide; and Morphine and related   Review of Systems Review of Systems  Unable to perform ROS: Dementia     Physical Exam Updated Vital Signs BP (!) 170/84   Pulse 61   Temp (!) 97 F (36.1 C) (Oral)   Resp 11   SpO2 92%   Physical Exam  Constitutional: She appears well-developed and well-nourished. No distress.  HENT:  Head: Normocephalic.  Right Ear: External ear normal.  Left Ear: External ear normal.   Mouth/Throat: Oropharynx is clear and moist.  Laceration to her left forehead. Bleeding controlled with gauze. No obvious skull fracture noted. Macerated and lacerated tissue to left frontal skull. Small abrasion noted to bridge of nose.  Throat is clear. No facial bone TTP or crepitus.  Bilateral tympanic membranes are pearly-gray without erythema or loss of landmarks.   Eyes: Conjunctivae are normal. Pupils are equal, round, and reactive to light. Right eye exhibits no discharge. Left eye exhibits no discharge.  Neck: Neck supple.  Wearing C-collar.   Cardiovascular: Normal rate, normal heart sounds and intact distal pulses. Exam reveals no gallop and no friction rub.  No murmur heard. afib on EMS monitor.   Pulmonary/Chest: Effort normal and breath sounds normal. No respiratory distress. She has no wheezes. She has no rales. She exhibits no tenderness.  Lungs are clear to ascultation bilaterally. Symmetric chest expansion bilaterally. No increased work of breathing. No  rales or rhonchi.  No chest wall TTP.   Abdominal: Soft. There is no tenderness.  Musculoskeletal: She exhibits no edema, tenderness or deformity.  Patient is spontaneously moving all extremities in a coordinated fashion exhibiting good strength.  Clavicle tenderness bilaterally.  Patient's bilateral shoulder, elbow, wrist, hip, knee and ankle joints are supple and nontender to palpation. Skin tear noted to left mid upper arm. Unsure if old or new. No deformity.   Lymphadenopathy:    She has no cervical adenopathy.  Neurological: She is alert. She exhibits normal muscle tone. Coordination normal. GCS eye subscore is 4. GCS verbal subscore is 1. GCS motor subscore is 5.  Alert and non-verbal. Will open eyes to loud voice. Will not follow commands. Tracking appropriately when spoken to.   Skin: Skin is warm and dry. No rash noted. She is not diaphoretic. No erythema. No pallor.  Psychiatric: She has a normal mood and affect. Her  behavior is normal.  Nursing note and vitals reviewed.    ED Treatments / Results  Labs (all labs ordered are listed, but only abnormal results are displayed) Labs Reviewed  BASIC METABOLIC PANEL - Abnormal; Notable for the following components:      Result Value   BUN 24 (*)    All other components within normal limits  CBC WITH DIFFERENTIAL/PLATELET - Abnormal; Notable for the following components:   RBC 3.67 (*)    MCV 101.1 (*)    MCH 34.3 (*)    All other components within normal limits  CBG MONITORING, ED    EKG  EKG Interpretation None       Radiology Ct Head Wo Contrast  Result Date: 02/20/2017 CLINICAL DATA:  Unwitnessed fall. EXAM: CT HEAD WITHOUT CONTRAST CT CERVICAL SPINE WITHOUT CONTRAST TECHNIQUE: Multidetector CT imaging of the head and cervical spine was performed following the standard protocol without intravenous contrast. Multiplanar CT image reconstructions of the cervical spine were also generated. COMPARISON:  CT scan of November 22, 2016 and March 17, 2016. FINDINGS: CT HEAD FINDINGS Brain: Moderate diffuse cortical atrophy is noted. Mild chronic ischemic white matter disease is noted. Ventricular size is within normal limits. No mass effect or midline shift is noted. Right occipital low density is noted consistent with sequela of prior intraparenchymal hemorrhage. No acute hemorrhage or infarction or mass lesion is noted. Vascular: No hyperdense vessel or unexpected calcification. Skull: Normal. Negative for fracture or focal lesion. Sinuses/Orbits: No acute finding. Other: Left frontal scalp laceration is noted. CT CERVICAL SPINE FINDINGS Alignment: Grade 1 anterolisthesis of C3-4 and C4-5 is noted secondary to posterior facet joint hypertrophy. Skull base and vertebrae: There is noted a nondisplaced fracture involving the base of the odontoid which extends slightly into the body of the vertebral body as well consistent with type 3 odontoid fracture. Also  noted is nondisplaced fracture involving the left-sided lamina of C1. Soft tissues and spinal canal: No prevertebral fluid or swelling. No visible canal hematoma. Disc levels: Severe degenerative disc disease is noted at C4-5, C5-6 and C6-7. Upper chest: Negative. Other: None. IMPRESSION: Moderate diffuse cortical atrophy. Mild chronic ischemic white matter disease. Sequela of prior intraparenchymal hemorrhage is noted in right occipital lobe. No acute intracranial abnormality is noted. Left frontal scalp laceration is noted. Nondisplaced type 3 odontoid fracture is noted. Nondisplaced fracture is seen involving the left-sided lamina of C1. Critical Value/emergent results were called by telephone at the time of interpretation on 02/10/2017 at 10:25 am to Dr. Waynetta Pean , who  verbally acknowledged these results. Severe multilevel degenerative disc disease is noted. Electronically Signed   By: Marijo Conception, M.D.   On: 01/31/2017 10:26   Ct Cervical Spine Wo Contrast  Result Date: 01/30/2017 CLINICAL DATA:  Unwitnessed fall. EXAM: CT HEAD WITHOUT CONTRAST CT CERVICAL SPINE WITHOUT CONTRAST TECHNIQUE: Multidetector CT imaging of the head and cervical spine was performed following the standard protocol without intravenous contrast. Multiplanar CT image reconstructions of the cervical spine were also generated. COMPARISON:  CT scan of November 22, 2016 and March 17, 2016. FINDINGS: CT HEAD FINDINGS Brain: Moderate diffuse cortical atrophy is noted. Mild chronic ischemic white matter disease is noted. Ventricular size is within normal limits. No mass effect or midline shift is noted. Right occipital low density is noted consistent with sequela of prior intraparenchymal hemorrhage. No acute hemorrhage or infarction or mass lesion is noted. Vascular: No hyperdense vessel or unexpected calcification. Skull: Normal. Negative for fracture or focal lesion. Sinuses/Orbits: No acute finding. Other: Left frontal scalp  laceration is noted. CT CERVICAL SPINE FINDINGS Alignment: Grade 1 anterolisthesis of C3-4 and C4-5 is noted secondary to posterior facet joint hypertrophy. Skull base and vertebrae: There is noted a nondisplaced fracture involving the base of the odontoid which extends slightly into the body of the vertebral body as well consistent with type 3 odontoid fracture. Also noted is nondisplaced fracture involving the left-sided lamina of C1. Soft tissues and spinal canal: No prevertebral fluid or swelling. No visible canal hematoma. Disc levels: Severe degenerative disc disease is noted at C4-5, C5-6 and C6-7. Upper chest: Negative. Other: None. IMPRESSION: Moderate diffuse cortical atrophy. Mild chronic ischemic white matter disease. Sequela of prior intraparenchymal hemorrhage is noted in right occipital lobe. No acute intracranial abnormality is noted. Left frontal scalp laceration is noted. Nondisplaced type 3 odontoid fracture is noted. Nondisplaced fracture is seen involving the left-sided lamina of C1. Critical Value/emergent results were called by telephone at the time of interpretation on 02/12/2017 at 10:25 am to Dr. Waynetta Pean , who verbally acknowledged these results. Severe multilevel degenerative disc disease is noted. Electronically Signed   By: Marijo Conception, M.D.   On: 02/04/2017 10:26   Dg Humerus Right  Result Date: 01/30/2017 CLINICAL DATA:  Fall.  Skin tear. EXAM: RIGHT HUMERUS - 2+ VIEW COMPARISON:  No recent. FINDINGS: Diffuse osteopenia and degenerative change. No evidence of fracture dislocation. IMPRESSION: Diffuse osteopenia degenerative change.  No acute abnormality. Electronically Signed   By: Marcello Moores  Register   On: 02/20/2017 09:57    Procedures .Marland KitchenLaceration Repair Date/Time: 02/15/2017 1:09 PM Performed by: Waynetta Pean, PA-C Authorized by: Waynetta Pean, PA-C   Anesthesia (see MAR for exact dosages):    Anesthesia method:  Topical application and local infiltration    Topical anesthetic:  LET   Local anesthetic:  Lidocaine 1% WITH epi Laceration details:    Location:  Face   Face location:  Forehead   Length (cm):  7   Depth (mm):  10 Repair type:    Repair type:  Intermediate Pre-procedure details:    Preparation:  Patient was prepped and draped in usual sterile fashion and imaging obtained to evaluate for foreign bodies Exploration:    Wound exploration: entire depth of wound probed and visualized     Wound extent: no underlying fracture noted     Contaminated: no   Treatment:    Area cleansed with:  Saline   Amount of cleaning:  Standard   Irrigation solution:  Sterile  saline Skin repair:    Repair method:  Sutures   Suture size:  5-0   Suture material:  Prolene   Suture technique:  Simple interrupted   Number of sutures:  4 Approximation:    Approximation:  Close Post-procedure details:    Dressing:  Antibiotic ointment and non-adherent dressing   Patient tolerance of procedure:  Tolerated well, no immediate complications   (including critical care time)  Medications Ordered in ED Medications  acetaminophen (TYLENOL) tablet 650 mg (not administered)    Or  acetaminophen (TYLENOL) suppository 650 mg (not administered)  ondansetron (ZOFRAN-ODT) disintegrating tablet 4 mg (not administered)    Or  ondansetron (ZOFRAN) injection 4 mg (not administered)  antiseptic oral rinse (BIOTENE) solution 15 mL (not administered)  polyvinyl alcohol (LIQUIFILM TEARS) 1.4 % ophthalmic solution 1 drop (not administered)  0.9 %  sodium chloride infusion ( Intravenous New Bag/Given 02/05/2017 1252)  HYDROmorphone (DILAUDID) injection 0.5 mg (not administered)  diphenhydrAMINE (BENADRYL) injection 12.5 mg (not administered)  bisacodyl (DULCOLAX) suppository 10 mg (not administered)  chlorproMAZINE (THORAZINE) 12.5 mg in sodium chloride 0.9 % 25 mL IVPB (not administered)  LORazepam (ATIVAN) injection 0.5 mg (not administered)  nystatin  (MYCOSTATIN/NYSTOP) topical powder (not administered)  bacitracin ointment 1 application (not administered)  fentaNYL (SUBLIMAZE) injection 25 mcg (25 mcg Intravenous Given 02/19/2017 1009)  fentaNYL (SUBLIMAZE) injection 50 mcg (50 mcg Intravenous Given 01/27/2017 1116)  LORazepam (ATIVAN) injection 0.5 mg (0.5 mg Intravenous Given 02/01/2017 1120)  lidocaine-EPINEPHrine-tetracaine (LET) solution (3 mLs Topical Given 02/08/2017 1125)  lidocaine-EPINEPHrine (XYLOCAINE W/EPI) 2 %-1:200000 (PF) injection 10 mL (10 mLs Infiltration Given 01/23/2017 1125)  fentaNYL (SUBLIMAZE) injection 50 mcg (50 mcg Intravenous Given 02/05/2017 1246)     Initial Impression / Assessment and Plan / ED Course  I have reviewed the triage vital signs and the nursing notes.  Pertinent labs & imaging results that were available during my care of the patient were reviewed by me and considered in my medical decision making (see chart for details).    This  is a 84 y.o. Female who presents to the ED via EMS after an unwitnessed fall prior to arrival. She is from [place]. She was found face down by nursing staff bleeding from her forehead. EMS was concerned for depressed skull fracture. No other visible or palpated signs of trauma per EMS. No anticoagulation use. She has history of dementia and is oriented to self only at baseline. DNR and MOST form with EMS. On exam patient is afebrile and non-toxic appearing. She is non-verbal. She appears to be uncomfortable.  A large laceration and maceration of tissue to her forehead.  No crepitus noted.  There is what appears to be an old skin tear to her left upper arm.  No other visible or palpated signs of injury or trauma.  Lungs clear to auscultation. CT head without contrast shows no acute intracranial abnormality.  CT cervical spine shows a nondisplaced type III odontoid fracture and a nondisplaced fracture of the left side of the lamina of C1. This patient is not likely a surgical candidate.   She does have a DNR and most form at bedside.  Will speak with family on the telephone.  I called and spoke with the patient's daughter Jill Alexanders [phone].  I explained the findings today.  Patient's daughter reports her most concern is that the patient is comfortable and not in pain. She would not proceed with surgery.  She reports the patient is  currently in palliative care due to her end-stage Alzheimer's disease.  She would like [place] to control her pain.  She understands that controlling the patient's pain may affect her breathing and blood pressure and is okay with this. Will plan for admission and consult to palliative care.  Family agrees with plan.   I spoke with palliative care team who would like the patient admitted by medicine and they will see her in consult.  Will consult with hospitalist service.  I consulted with hospitalist service who accepted the patient for admission.  Patient was changed into a cervical soft collar for comfort.  Laceration was repaired of her forehead.  Will need to watch closely for infection.  Sutures out in about 7 days. Patient much more comfortable after fentanyl and half milligram of Ativan.  She appears very comfortable.  Patient is awaiting a bed.   This patient was dicussed with and evaluated by Dr. Colvin Caroli who agrees with assessment and plan.    Final Clinical Impressions(s) / ED Diagnoses   Final diagnoses:  Fall, initial encounter  Facial laceration, initial encounter  Closed odontoid fracture with type III morphology, initial encounter [facility])  Other closed nondisplaced fracture of first cervical vertebra, initial encounter [facility])    ED Discharge Orders    None       Waynetta Pean, PA-C 02/19/2017 1449    Charlesetta Shanks, MD 01/26/17 1734

## 2017-01-25 NOTE — H&P (Signed)
History and Physical    [name] PPJ:[id number] DOB: 08-Oct-1917 DOA: 01/31/2017   PCP: Blanchie Serve, MD   Patient coming from: Nursing Home    Chief Complaint: Unwitnessed fall  HPI: [name] is a 84 y.o. female nursing home resident with medical history significant for Alzheimer's dementia, depression, generalized anxiety disorder, history of recurrent falls due to unsteady gait and poor safety awareness, with recent admission 1 month prior for intracranial bleed broughtvia EMS after an unwitnessed fall prior to arrival.  He was found face down, bleeding from her forehead.  EMS was concerning for depressed skull fracture.  There were no other visible or palpated signs of trauma per EMS.  She is not on anticoagulation.  She is DNR.  Per nursing home notes, the patient has been increasingly weak.  She also has intermittent.'s of agitation and depression along with anxiety.  Other information cannot be obtained, due to level 5 caveat in the setting of dementia and falls.   ED Course:  BP (!) 170/84   Pulse 64   Temp (!) 97 F (36.1 C) (Oral)   Resp (!) 28   SpO2 93%   CT of the head on 02/07/2017 shows left frontal scalp laceration, and nondisplaced type III odontoid fracture, a nondisplaced fracture seen involving the left-sided lamina of C1. Right humerus without acute abnormality. She was placed in a cervical collar, pain medication was given including fentanyl x2. EDP has spoken with family by phone, who are understanding of the patient's situation and clinical decline, and they do not wish to pursue any heroic treatments or procedures.  They wish for the patient to be admitted for comfort care only.  Palliative care consultation is pending. UA, CBC and chemistries unremarkable   Review of Systems: Unable to obtain due to level V Caveat due to dementia   Past Medical History:  Diagnosis Date  . Abnormality of gait 03/15/2010  . Anxiety state, unspecified 03/15/2010  .  Dementia   . Depression, major 06/15/2015  . Depressive disorder, not elsewhere classified 03/15/2010  . Dysphagia, unspecified(787.20) 03/15/2010  . Generalized anxiety disorder 06/23/2014  . Herpes zoster without mention of complication 5/80/9983  . Hypothyroidism   . Other alteration of consciousness 03/15/2010  . Pain in hand 08/13/12  . Personal history of fall 03/15/2010  . Senile osteoporosis 03/15/2010  . Unspecified constipation 03/15/2010  . Unspecified essential hypertension 03/15/2010  . Unspecified hearing loss 08/22/2011  . Unspecified urinary incontinence 03/15/2010  . Urinary incontinence 03/15/2010  . Vaginal prolapse 06/15/2015    Past Surgical History:  Procedure Laterality Date  . INCISION / DRAINAGE HAND / FINGER Right 06/2008   hand  . SPINE SURGERY  12/2006    Social History Social History   Socioeconomic History  . Marital status: Divorced    Spouse name: Not on file  . Number of children: Not on file  . Years of education: Not on file  . Highest education level: Not on file  Social Needs  . Financial resource strain: Not on file  . Food insecurity - worry: Not on file  . Food insecurity - inability: Not on file  . Transportation needs - medical: Not on file  . Transportation needs - non-medical: Not on file  Occupational History  . Occupation: Arts Management  Tobacco Use  . Smoking status: Never Smoker  . Smokeless tobacco: Never Used  Substance and Sexual Activity  . Alcohol use: Yes    Comment: 2-4 oz  of wine once daily at request  . Drug use: No  . Sexual activity: No  Other Topics Concern  . Not on file  Social History Narrative   Lives at [facility], in [place] since 12/28/2014   Widowed   Walker   Never smoked   Alcohol wine occasionally    Exercise walks a lot, with walker   POA, Living Will     Allergies  Allergen Reactions  . Ace Inhibitors Other (See Comments)    Reaction unknown/ listed on MAR  . Hydrochlorothiazide Other (See  Comments)    Reaction unknown/ listed on MAR  . Morphine And Related Other (See Comments)    Hallucinations    Family History  Problem Relation Age of Onset  . Heart disease Father        MI      Prior to Admission medications   Medication Sig Start Date End Date Taking? Authorizing Provider  acetaminophen (TYLENOL) 325 MG tablet Take 2 tablets (650 mg total) by mouth every 6 (six) hours as needed for mild pain (or Fever >/= 101). 03/19/16   Oswald Hillock, MD  acetaminophen (TYLENOL) 500 MG tablet Take 1,000 mg by mouth at bedtime.    [provider]  Calcium Carbonate-Vitamin D (CALCIUM 600+D) 600-200 MG-UNIT TABS Take 1 tablet by mouth daily.     [provider]  citalopram (CELEXA) 20 MG tablet Take 20 mg by mouth daily.    [provider]  cyanocobalamin 1000 MCG tablet Take 1,000 mcg by mouth daily.     [provider]  hydrocortisone (PROCTO-MED HC) 2.5 % rectal cream Place 1 application rectally every 12 (twelve) hours as needed for hemorrhoids or itching.    [provider]  levothyroxine (SYNTHROID, LEVOTHROID) 25 MCG tablet Take 25 mcg by mouth daily before breakfast.    [provider]  LORazepam (ATIVAN) 0.5 MG tablet Take 0.25 mg by mouth 2 (two) times daily.    [provider]  metoprolol tartrate (LOPRESSOR) 25 MG tablet Take 12.5 mg by mouth at bedtime.    [provider]  metoprolol tartrate (LOPRESSOR) 25 MG tablet Take 25 mg every morning by mouth.     [provider]  mineral oil-hydrophilic petrolatum (AQUAPHOR) ointment Apply 1 application topically daily as needed for dry skin.    [provider]  mirtazapine (REMERON) 15 MG tablet Take 15 mg by mouth at bedtime.    [provider]  Nutritional Supplements (RESOURCE 2.0) LIQD Take 120 mLs by mouth 2 (two) times daily between meals. At 10AM and 4PM    [provider]  polyethylene glycol (MIRALAX / GLYCOLAX)  packet Take 17 g by mouth daily. 01/24/16   Eber Jones, MD  risperiDONE (RISPERDAL) 0.5 MG tablet Take 0.25 mg by mouth daily. At [facility]    [provider]    Physical Exam:  Vitals:   02/22/2017 1015 02/09/2017 1045 02/22/2017 1115 02/04/2017 1130  BP: (!) 148/89 (!) 170/84    Pulse: 65 80 62 64  Resp: 17 17 (!) 26 (!) 28  Temp:      TempSrc:      SpO2: 96% 97% 97% 93%   Constitutional: ill appearing, eyes open, no distress Eyes: PERRL, lids and conjunctivae normal ENMT: Mucous membranes are moist, without exudate or lesions  Neck: Is immobilized, the patient is on a soft c-collar  respiratory: clear to auscultation bilaterally anteriorly, no wheezing, no crackles. Normal respiratory effort  Cardiovascular: Irregular rate and rhythm, 2 out of 6 murmur, rubs or gallops. No extremity edema. 2+ pedal pulses. No carotid bruits.  Abdomen: Soft, no apparent tenderness, No hepatosplenomegaly. Bowel sounds positive.  Musculoskeletal: no clubbing / cyanosis the patient spontaneously moves her extremities in a coordinated fashion, tenderness at the shoulders. There is a skin tear to the left mid upper arm.  No deformities noted. Skin: no jaundice, exacerbation of the left forehead, as well as on the nose,.  Neurologic: Sensation intact  Strength appears equal in all extremities Psychiatric: Awake, opens eyes to loud voice, but not follow command.    Labs on Admission: I have personally reviewed following labs and imaging studies  CBC: Recent Labs  Lab 02/09/2017 0928  WBC 8.1  NEUTROABS 4.4  HGB 12.6  HCT 37.1  MCV 101.1*  PLT 622    Basic Metabolic Panel: Recent Labs  Lab 02/13/2017 0928  NA 137  K 4.0  CL 103  CO2 26  GLUCOSE 95  BUN 24*  CREATININE 0.71  CALCIUM 9.2    GFR: Estimated Creatinine Clearance: 31.5 mL/min (by C-G formula based on SCr of 0.71 mg/dL).  Liver Function Tests: No results for input(s): AST, ALT, ALKPHOS, BILITOT, PROT, ALBUMIN in the  last 168 hours. No results for input(s): LIPASE, AMYLASE in the last 168 hours. No results for input(s): AMMONIA in the last 168 hours.  Coagulation Profile: No results for input(s): INR, PROTIME in the last 168 hours.  Cardiac Enzymes: No results for input(s): CKTOTAL, CKMB, CKMBINDEX, TROPONINI in the last 168 hours.  BNP (last 3 results) No results for input(s): PROBNP in the last 8760 hours.  HbA1C: No results for input(s): HGBA1C in the last 72 hours.  CBG: No results for input(s): GLUCAP in the last 168 hours.  Lipid Profile: No results for input(s): CHOL, HDL, LDLCALC, TRIG, CHOLHDL, LDLDIRECT in the last 72 hours.  Thyroid Function Tests: No results for input(s): TSH, T4TOTAL, FREET4, T3FREE, THYROIDAB in the last 72 hours.  Anemia Panel: No results for input(s): VITAMINB12, FOLATE, FERRITIN, TIBC, IRON, RETICCTPCT in the last 72 hours.  Urine analysis:    Component Value Date/Time   COLORURINE AMBER (A) 11/22/2016 0003   APPEARANCEUR CLOUDY (A) 11/22/2016 0003   LABSPEC 1.024 11/22/2016 0003   PHURINE 5.0 11/22/2016 0003   GLUCOSEU NEGATIVE 11/22/2016 0003   HGBUR NEGATIVE 11/22/2016 0003   BILIRUBINUR NEGATIVE 11/22/2016 0003   BILIRUBINUR negative 12/07/2014 1706   KETONESUR 20 (A) 11/22/2016 0003   PROTEINUR 100 (A) 11/22/2016 0003   UROBILINOGEN 1.0 12/07/2014 1706   UROBILINOGEN 0.2 01/04/2010 2342   NITRITE NEGATIVE 11/22/2016 0003   LEUKOCYTESUR TRACE (A) 11/22/2016 0003    Sepsis Labs: @LABRCNTIP (procalcitonin:4,lacticidven:4) )No results found for this or any previous visit (from the past 240 hour(s)).   Radiological Exams on Admission: Ct Head Wo Contrast  Result Date: 02/04/2017 CLINICAL DATA:  Unwitnessed fall. EXAM: CT HEAD WITHOUT CONTRAST CT CERVICAL SPINE WITHOUT CONTRAST TECHNIQUE: Multidetector CT imaging of the head and cervical spine was performed following the standard protocol without intravenous contrast. Multiplanar CT image  reconstructions of the cervical spine were also generated. COMPARISON:  CT scan of November 22, 2016 and March 17, 2016. FINDINGS: CT HEAD FINDINGS Brain: Moderate diffuse cortical atrophy is noted. Mild chronic ischemic white matter disease is noted. Ventricular size is within normal limits. No mass effect or midline shift is noted. Right occipital low density is noted consistent with sequela of prior intraparenchymal hemorrhage. No  acute hemorrhage or infarction or mass lesion is noted. Vascular: No hyperdense vessel or unexpected calcification. Skull: Normal. Negative for fracture or focal lesion. Sinuses/Orbits: No acute finding. Other: Left frontal scalp laceration is noted. CT CERVICAL SPINE FINDINGS Alignment: Grade 1 anterolisthesis of C3-4 and C4-5 is noted secondary to posterior facet joint hypertrophy. Skull base and vertebrae: There is noted a nondisplaced fracture involving the base of the odontoid which extends slightly into the body of the vertebral body as well consistent with type 3 odontoid fracture. Also noted is nondisplaced fracture involving the left-sided lamina of C1. Soft tissues and spinal canal: No prevertebral fluid or swelling. No visible canal hematoma. Disc levels: Severe degenerative disc disease is noted at C4-5, C5-6 and C6-7. Upper chest: Negative. Other: None. IMPRESSION: Moderate diffuse cortical atrophy. Mild chronic ischemic white matter disease. Sequela of prior intraparenchymal hemorrhage is noted in right occipital lobe. No acute intracranial abnormality is noted. Left frontal scalp laceration is noted. Nondisplaced type 3 odontoid fracture is noted. Nondisplaced fracture is seen involving the left-sided lamina of C1. Critical Value/emergent results were called by telephone at the time of interpretation on 02/13/2017 at 10:25 am to Dr. Waynetta Pean , who verbally acknowledged these results. Severe multilevel degenerative disc disease is noted. Electronically Signed   By:  Marijo Conception, M.D.   On: 01/23/2017 10:26   Ct Cervical Spine Wo Contrast  Result Date: 02/08/2017 CLINICAL DATA:  Unwitnessed fall. EXAM: CT HEAD WITHOUT CONTRAST CT CERVICAL SPINE WITHOUT CONTRAST TECHNIQUE: Multidetector CT imaging of the head and cervical spine was performed following the standard protocol without intravenous contrast. Multiplanar CT image reconstructions of the cervical spine were also generated. COMPARISON:  CT scan of November 22, 2016 and March 17, 2016. FINDINGS: CT HEAD FINDINGS Brain: Moderate diffuse cortical atrophy is noted. Mild chronic ischemic white matter disease is noted. Ventricular size is within normal limits. No mass effect or midline shift is noted. Right occipital low density is noted consistent with sequela of prior intraparenchymal hemorrhage. No acute hemorrhage or infarction or mass lesion is noted. Vascular: No hyperdense vessel or unexpected calcification. Skull: Normal. Negative for fracture or focal lesion. Sinuses/Orbits: No acute finding. Other: Left frontal scalp laceration is noted. CT CERVICAL SPINE FINDINGS Alignment: Grade 1 anterolisthesis of C3-4 and C4-5 is noted secondary to posterior facet joint hypertrophy. Skull base and vertebrae: There is noted a nondisplaced fracture involving the base of the odontoid which extends slightly into the body of the vertebral body as well consistent with type 3 odontoid fracture. Also noted is nondisplaced fracture involving the left-sided lamina of C1. Soft tissues and spinal canal: No prevertebral fluid or swelling. No visible canal hematoma. Disc levels: Severe degenerative disc disease is noted at C4-5, C5-6 and C6-7. Upper chest: Negative. Other: None. IMPRESSION: Moderate diffuse cortical atrophy. Mild chronic ischemic white matter disease. Sequela of prior intraparenchymal hemorrhage is noted in right occipital lobe. No acute intracranial abnormality is noted. Left frontal scalp laceration is noted.  Nondisplaced type 3 odontoid fracture is noted. Nondisplaced fracture is seen involving the left-sided lamina of C1. Critical Value/emergent results were called by telephone at the time of interpretation on 02/10/2017 at 10:25 am to Dr. Waynetta Pean , who verbally acknowledged these results. Severe multilevel degenerative disc disease is noted. Electronically Signed   By: Marijo Conception, M.D.   On: 02/12/2017 10:26   Dg Humerus Right  Result Date: 02/14/2017 CLINICAL DATA:  Fall.  Skin tear. EXAM: RIGHT HUMERUS -  2+ VIEW COMPARISON:  No recent. FINDINGS: Diffuse osteopenia and degenerative change. No evidence of fracture dislocation. IMPRESSION: Diffuse osteopenia degenerative change.  No acute abnormality. Electronically Signed   By: Marcello Moores  Register   On: 02/04/2017 09:57    EKG: Independently reviewed.  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  They wish for the patient to be admitted for comfort care only.  Palliative care consultation is pending.   She was placed in a cervical collar, pain medication was given including fentanyl x2.  Admit to Medsurg Comfort care order set  Pain control with DIlaudid  0.5 mg IV q 2 h prn Other prn include Ativan, Throazine, Zofran   Will hold her  home meds for now No labs to be drawn ir furhter imaging  VS q shift  Family from out of town on their way to [place] r[place] issues not to be addressed as patient is for comfort care issues only.   DVT prophylaxis:  None  Code Status:    DNR  Family Communication:  None  Disposition Plan: Patient expected to die in hospital  Consults called:    Palliative Care per EDP  Admission status: Medsurg IP    Sharene Butters, PA-C [facility]   Amion text  [phone]   02/02/2017, 12:05 PM

## 2017-01-25 NOTE — ED Provider Notes (Signed)
Medical screening examination/treatment/procedure(s) were conducted as a shared visit with non-physician practitioner(s) and myself.  I personally evaluated the patient during the encounter.   EKG Interpretation None     With unwitnessed fall from friend's home.  Patient was noted to have significant forehead contusion and bleeding with concern for skull fracture.  Upon evaluation the patient is distressed with moaning and tearful.  She is alert.  Eyes open with eye contact.  She does not interact verbally.  She has laceration and hematoma to the forehead.  Patient has no respiratory distress.  CT results reviewed.  Patient's case has been reviewed by Gwynneth Macleod with patient's daughter.  I agree with plan and management of palliative pain control.   Charlesetta Shanks, MD 02/22/2017 1136

## 2017-01-25 NOTE — Progress Notes (Signed)
Orthopedic Tech Progress Note Patient Details:  [name] 1917/12/05 [id number]  Ortho Devices Type of Ortho Device: Soft collar Ortho Device/Splint Interventions: Application   Post Interventions Patient Tolerated: Well Instructions Provided: Care of device   [place] Pink 01/23/2017, 12:36 PM

## 2017-01-25 NOTE — ED Notes (Signed)
Attempted report x1. 

## 2017-01-25 NOTE — Progress Notes (Signed)
Daily Progress Note   Patient Name: [name]       Date: 02/11/2017 DOB: [birth year]  Age: 84 y.o. MRN#: [id number] Attending Physician: Karmen Bongo, MD Primary Care Physician: Blanchie Serve, MD Admit Date: 02/14/2017  Reason for Consultation/Follow-up: Establishing goals of care and Terminal Care  Subjective:  Patient opens eyes to voice but will not answer questions or follow commands. No s/s of pain during visit. No family at bedside.   Length of Stay: 0  Current Medications: Scheduled Meds:  . bacitracin  1 application Topical BID    Continuous Infusions: . sodium chloride 10 mL/hr at 01/28/2017 1252  . chlorproMAZINE (THORAZINE) IV      PRN Meds: acetaminophen **OR** acetaminophen, antiseptic oral rinse, bisacodyl, chlorproMAZINE (THORAZINE) IV, diphenhydrAMINE, HYDROmorphone (DILAUDID) injection, LORazepam, nystatin, ondansetron **OR** ondansetron (ZOFRAN) IV, polyvinyl alcohol  Physical Exam  Constitutional: She appears ill.  HENT:  Head: Head is with laceration.  Neck:  Cervical collar  Cardiovascular: Regular rhythm.  Pulmonary/Chest: No accessory muscle usage. No tachypnea. No respiratory distress. She has decreased breath sounds.  Neurological: She is alert. She is disoriented.  Skin: Skin is dry. Ecchymosis noted.  Nursing note and vitals reviewed.          Vital Signs: BP (!) 170/84   Pulse (!) 57   Temp (!) 97 F (36.1 C) (Oral)   Resp 11   SpO2 95%  SpO2: SpO2: 95 % O2 Device: O2 Device: Not Delivered O2 Flow Rate:    Intake/output summary: No intake or output data in the 24 hours ending 02/09/2017 1612 LBM:   Baseline Weight:   Most recent weight:    Palliative Assessment/Data: PPS 20%     Patient Active Problem List   Diagnosis Date  Noted  . End of life care 01/27/2017  . Multiple falls 01/22/2017  . Bradycardia 01/22/2017  . 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 Intracranial hemorrhage ([place]) 11/22/2016  . ICH (intracerebral hemorrhage) ([place]) 11/22/2016  . Cytotoxic brain edema ([place]) 11/22/2016  . Cerebral amyloid angiopathy (CODE) 11/22/2016  . Contracture of finger joint, right 11/17/2016  . Severe protein-calorie malnutrition ([place]) 09/18/2016  . Closed patellar sleeve fracture of right knee 03/17/2016  . Fall 03/17/2016  . Fracture of multiple ribs 03/17/2016  . Vitamin B 12  deficiency 02/15/2016  . Hypothyroidism 12/14/2015  . Rash 09/21/2015  . Urinary tract infection 09/14/2015  . Constipation 06/15/2015  . Urinary frequency 06/15/2015  . Depression, major 06/15/2015  . Vaginal prolapse 06/15/2015  . Edema 04/21/2015  . Hallucinations 12/22/2014  . Speech abnormality   . TIA (transient ischemic attack) 12/10/2014  . Generalized anxiety disorder 06/23/2014  . Hearing loss 06/03/2014  . Insomnia 12/16/2013  . Trigger finger, acquired 03/04/2013  . Pain in hand   . Essential hypertension 03/15/2010  . Urinary incontinence 03/15/2010  . Depression, recurrent ([place]) 03/15/2010    Palliative Care Assessment & Plan   Patient Profile: [name] is a 84 y.o. Female with past medical history of Alzheimer's dementia, depression, generalized anxiety disorder, and recurrent falls. Recent hospital admission for ICH. Brought to ED via EMS after unwitnessed fall from nursing home. Found to have nondisplaced odontoid fracture and left-sided lamina of C1. Per attending, family requesting comfort measures only. Palliative medicine consultation for EOL care.   Assessment: Alzheimer's dementia Recurrent falls Fractures Stable ICH  Recommendations/Plan:  DNR/DNI. Comfort measures. No escalation of care.   Symptom management  Dilaudid 0.5mg  IV q2h prn  pain/dyspnea  Ativan 0.5mg  IV q4h prn anxiety  Robinul 0.2mg  IV q4h prn secretions  Dulcolax suppository prn constipation  Comfort feeds per patient/family request.  RN may pronounce.  Waiting on family to arrive. PMT will f/u in AM to further discuss EOL care/hospice options.   Goals of Care and Additional Recommendations:  Limitations on Scope of Treatment: Full Comfort Care  Code Status: DNR   Code S tatus Orders  (From admission, onward)        Start     Ordered   02/11/2017 1211  Do not attempt resuscitation (DNR)  Continuous    Question Answer Comment  In the event of cardiac or respiratory ARREST Do not call a "code blue"   In the event of cardiac or respiratory ARREST Do not perform Intubation, CPR, defibrillation or ACLS   In the event of cardiac or respiratory ARREST Use medication by any route, position, wound care, and other measures to relive pain and suffering. May use oxygen, suction and manual treatment of airway obstruction as needed for comfort.      02/12/2017 1211    Code Status History    Date Active Date Inactive Code Status Order ID Comments User Context   02/08/2017 11:47 02/15/2017 12:11 DNR [id number]  Rondel Jumbo, PA-C ED   11/22/2016 04:48 11/25/2016 00:25 DNR [id number]  Greta Doom, MD Inpatient   03/17/2016 14:21 03/19/2016 19:49 DNR [id number]  Annita Brod, MD Inpatient   01/21/2016 18:37 01/23/2016 21:18 DNR [id number]  Elmarie Shiley, MD Inpatient   12/11/2014 14:50 12/11/2014 18:34 Full Code [id number]  Theodis Blaze, MD Inpatient   12/10/2014 18:32 12/11/2014 14:50 DNR [id number]  Modena Jansky, MD Inpatient    Advance Directive Documentation     Most Recent Value  Type of Advance Directive  Living will, Out of facility DNR (pink MOST or yellow form), Healthcare Power of Attorney  Pre-existing out of facility DNR order (yellow form or pink MOST form)  Pink MOST form placed in chart (order not valid for inpatient use),  Yellow form placed in chart (order not valid for inpatient use)  "MOST" Form in Place?  No data       Prognosis:   < 2 weeks  Discharge Planning:  To Be Determined  Care plan was  discussed with patient and RN  Thank you for allowing the Palliative Medicine Team to assist in the care of this patient.   Time In: 1555 Time Out: 1620 Total Time 70min Prolonged Time Billed  no      Greater than 50%  of this time was spent counseling and coordinating care related to the above assessment and plan.  Ihor Dow, FNP-C Palliative Medicine Team  Phone: [phone] Fax: [phone]  Please contact Palliative Medicine Team phone at [phone] for questions and concerns.

## 2017-01-25 NOTE — ED Triage Notes (Signed)
Pt arrives from [facility] via [place] reporting unwitnessed fall this am, reports pt found face down.  EMS reports  Skull depression with "oozing" bleeding, EMS reports no known use of blood thinners. EMS reports pt has hx dementia, oriented to self at baseline. On arrival pt whimpering, non verbal, not following commands.

## 2017-01-25 NOTE — Progress Notes (Signed)
   01/28/2017 1200  Clinical Encounter Type  Visited With Patient;Health care provider  Visit Type Initial  Spiritual Encounters  Spiritual Needs Emotional  While rounding in the ED was made aware of this patient.  Was able to contact family to be sure they were aware and coordinated with the Physicians to contact them.  Was able to provide bedside support and comfort for the patient particularly when the pain level was high.  Provided a calming presence and a pastoral support.  Will follow as needed. Chaplain Katherene Ponto

## 2017-01-26 DIAGNOSIS — W19XXXD Unspecified fall, subsequent encounter: Secondary | ICD-10-CM

## 2017-01-26 DIAGNOSIS — R52 Pain, unspecified: Secondary | ICD-10-CM

## 2017-01-26 DIAGNOSIS — S12120A Other displaced dens fracture, initial encounter for closed fracture: Secondary | ICD-10-CM

## 2017-01-26 LAB — MRSA PCR SCREENING: MRSA by PCR: NEGATIVE

## 2017-01-26 MED ORDER — HYDROMORPHONE HCL 1 MG/ML IJ SOLN
0.5000 mg | Freq: Four times a day (QID) | INTRAMUSCULAR | Status: DC
  Administered 2017-01-26 – 2017-01-29 (×12): 0.5 mg via INTRAVENOUS
  Filled 2017-01-26 (×11): qty 1

## 2017-01-26 NOTE — Progress Notes (Signed)
   01/26/17 1619  Vitals  Temp 98.2 F (36.8 C)  Temp Source Oral  BP (!) 134/58  MAP (mmHg) 78  BP Location Right Arm  BP Method Automatic  Patient Position (if appropriate) Lying  Pulse Rate 68  Pulse Rate Source Monitor  Resp 10  Oxygen Therapy  SpO2 92 %  O2 Device Nasal Cannula  O2 Flow Rate (L/min) 2 L/min  Eyes closed, pt resting quietly,  respirations even and unlabored, no acute distress observed, will continue to monitor.

## 2017-01-26 NOTE — Progress Notes (Signed)
Pt resting in bed with eyes closed, responds to tactile stimuli, nonverbal, respirations even and unlabored, foley in place, HOB 20 degrees, wound care provided to frontal scalp laceration, sutures intact, bacitracin applied and covered with gauze, no acute distress observed at this time, family at bedside.

## 2017-01-26 NOTE — Progress Notes (Signed)
PROGRESS NOTE    [name]  BDZ:[id number] DOB: 10/09/1917 DOA: 02/19/2017 PCP: Blanchie Serve, MD    Brief Narrative: [name] is a 84 y.o. female with a Past Medical History of advanced dementia who presents with fractures following a fall.  She has a nondisplaced odontoid fracture and left-sided laminal fall of C1.  The family has requested comfort care measures.    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  Comfort care.  Palliative care following. Palliative discussed with family, goal is comfort care.   Alzheimer's dementia; advanced.  Comfort care.      DVT prophylaxis: Comfort care.  Code Status: DNR Family Communication: no family at bed side.  Disposition Plan: to be determine   Consultants:   palliative  Procedures: none     Antimicrobials: none   Subjective: Lethargic. She was just change , appears in pain.   Objective: Vitals:   02/16/2017 1645 01/24/2017 1700 01/24/2017 1800 01/24/2017 1928  BP: (!) 148/62 (!) 151/71 (!) 144/57 (!) 165/62  Pulse: 67 71 (!) 58 65  Resp:    12  Temp:      TempSrc:      SpO2: 96% 98% 94% 97%  Weight:    48.5 kg (106 lb 14.8 oz)    Intake/Output Summary (Last 24 hours) at 01/26/2017 1318 Last data filed at 01/26/2017 1124 Gross per 24 hour  Intake 61.33 ml  Output 550 ml  Net -488.67 ml   Filed Weights   01/28/2017 1928  Weight: 48.5 kg (106 lb 14.8 oz)    Examination:  General exam:  lethargic. Soft collar in place.  Respiratory system: Clear to auscultation. Respiratory effort normal. Cardiovascular system: S1 & S2 heard, RRR. Gastrointestinal system: Abdomen is nondistended, soft and nontender. No organomegaly or masses felt. Normal bowel sounds heard. Central nervous system: letahrgic Extremities: no edema      Data Reviewed: I have personally reviewed following labs and imaging studies  CBC: Recent Labs  Lab 02/01/2017 0928  WBC 8.1  NEUTROABS 4.4  HGB 12.6  HCT 37.1  MCV 101.1*  PLT 341   Basic Metabolic Panel: Recent Labs  Lab 02/07/2017 0928  NA 137  K 4.0  CL 103  CO2 26  GLUCOSE 95  BUN 24*  CREATININE 0.71  CALCIUM 9.2   GFR: Estimated Creatinine Clearance: 30.8 mL/min (by C-G formula based on SCr of 0.71 mg/dL). Liver Function Tests: No results for input(s): AST, ALT, ALKPHOS, BILITOT, PROT, ALBUMIN in the last 168 hours. No results for input(s): LIPASE, AMYLASE in the last 168 hours. No results for input(s): AMMONIA in the last 168 hours. Coagulation Profile: No results for input(s): INR, PROTIME in the last 168 hours. Cardiac Enzymes: No results for input(s): CKTOTAL, CKMB, CKMBINDEX, TROPONINI in the last 168 hours. BNP (last 3 results) No results for input(s): PROBNP in the last 8760 hours. HbA1C: No results for input(s): HGBA1C in the last 72 hours. CBG: No results for input(s): GLUCAP in the last  168 hours. Lipid Profile: No results for input(s): CHOL, HDL, LDLCALC, TRIG, CHOLHDL, LDLDIRECT in the last 72 hours. Thyroid Function Tests: No results for input(s): TSH, T4TOTAL, FREET4, T3FREE, THYROIDAB in the last 72 hours. Anemia Panel: No results for input(s): VITAMINB12, FOLATE, FERRITIN, TIBC, IRON, RETICCTPCT in the last 72 hours. Sepsis Labs: No results for input(s): PROCALCITON, LATICACIDVEN in the last 168 hours.  Recent Results (from the past 240 hour(s))  MRSA PCR Screening     Status: None   Collection Time:  01/26/17 12:50 AM  Result Value Ref Range Status   MRSA by PCR NEGATIVE NEGATIVE Final    Comment:        The GeneXpert MRSA Assay (FDA approved for NASAL specimens only), is one component of a comprehensive MRSA colonization surveillance program. It is not intended to diagnose MRSA infection nor to guide or monitor treatment for MRSA infections.          Radiology Studies: Ct Head Wo Contrast  Result Date: 02/01/2017 CLINICAL DATA:  Unwitnessed fall. EXAM: CT HEAD WITHOUT CONTRAST CT CERVICAL SPINE WITHOUT CONTRAST TECHNIQUE: Multidetector CT imaging of the head and cervical spine was performed following the standard protocol without intravenous contrast. Multiplanar CT image reconstructions of the cervical spine were also generated. COMPARISON:  CT scan of November 22, 2016 and March 17, 2016. FINDINGS: CT HEAD FINDINGS Brain: Moderate diffuse cortical atrophy is noted. Mild chronic ischemic white matter disease is noted. Ventricular size is within normal limits. No mass effect or midline shift is noted. Right occipital low density is noted consistent with sequela of prior intraparenchymal hemorrhage. No acute hemorrhage or infarction or mass lesion is noted. Vascular: No hyperdense vessel or unexpected calcification. Skull: Normal. Negative for fracture or focal lesion. Sinuses/Orbits: No acute finding. Other: Left frontal scalp laceration is noted. CT CERVICAL SPINE FINDINGS Alignment: Grade 1 anterolisthesis of C3-4 and C4-5 is noted secondary to posterior facet joint hypertrophy. Skull base and vertebrae: There is noted a nondisplaced fracture involving the base of the odontoid which extends slightly into the body of the vertebral body as well consistent with type 3 odontoid fracture. Also noted is nondisplaced fracture involving the left-sided lamina of C1. Soft tissues and spinal canal: No prevertebral fluid or swelling. No visible canal hematoma. Disc levels: Severe degenerative disc  disease is noted at C4-5, C5-6 and C6-7. Upper chest: Negative. Other: None. IMPRESSION: Moderate diffuse cortical atrophy. Mild chronic ischemic white matter disease. Sequela of prior intraparenchymal hemorrhage is noted in right occipital lobe. No acute intracranial abnormality is noted. Left frontal scalp laceration is noted. Nondisplaced type 3 odontoid fracture is noted. Nondisplaced fracture is seen involving the left-sided lamina of C1. Critical Value/emergent results were called by telephone at the time of interpretation on 02/01/2017 at 10:25 am to Dr. Waynetta Pean , who verbally acknowledged these results. Severe multilevel degenerative disc disease is noted. Electronically Signed   By: Marijo Conception, M.D.   On: 02/05/2017 10:26   Ct Cervical Spine Wo Contrast  Result Date: 02/04/2017 CLINICAL DATA:  Unwitnessed fall. EXAM: CT HEAD WITHOUT CONTRAST CT CERVICAL SPINE WITHOUT CONTRAST TECHNIQUE: Multidetector CT imaging of the head and cervical spine was performed following the standard protocol without intravenous contrast. Multiplanar CT image reconstructions of the cervical spine were also generated. COMPARISON:  CT scan of November 22, 2016 and March 17, 2016. FINDINGS: CT HEAD FINDINGS Brain: Moderate diffuse cortical atrophy is noted. Mild chronic ischemic white matter disease is noted. Ventricular size is  within normal limits. No mass effect or midline shift is noted. Right occipital low density is noted consistent with sequela of prior intraparenchymal hemorrhage. No acute hemorrhage or infarction or mass lesion is noted. Vascular: No hyperdense vessel or unexpected calcification. Skull: Normal. Negative for fracture or focal lesion. Sinuses/Orbits: No acute finding. Other: Left frontal scalp laceration is noted. CT CERVICAL SPINE FINDINGS Alignment: Grade 1 anterolisthesis of C3-4 and C4-5 is noted secondary to posterior facet joint hypertrophy. Skull base and vertebrae: There is noted a  nondisplaced fracture involving the base of the odontoid which extends slightly into the body of the vertebral body as well consistent with type 3 odontoid fracture. Also noted is nondisplaced fracture involving the left-sided lamina of C1. Soft tissues and spinal canal: No prevertebral fluid or swelling. No visible canal hematoma. Disc levels: Severe degenerative disc disease is noted at C4-5, C5-6 and C6-7. Upper chest: Negative. Other: None. IMPRESSION: Moderate diffuse cortical atrophy. Mild chronic ischemic white matter disease. Sequela of prior intraparenchymal hemorrhage is noted in right occipital lobe. No acute intracranial abnormality is noted. Left frontal scalp laceration is noted. Nondisplaced type 3 odontoid fracture is noted. Nondisplaced fracture is seen involving the left-sided lamina of C1. Critical Value/emergent results were called by telephone at the time of interpretation on 02/09/2017 at 10:25 am to Dr. Waynetta Pean , who verbally acknowledged these results. Severe multilevel degenerative disc disease is noted. Electronically Signed   By: Marijo Conception, M.D.   On: 02/05/2017 10:26   Dg Humerus Right  Result Date: 02/10/2017 CLINICAL DATA:  Fall.  Skin tear. EXAM: RIGHT HUMERUS - 2+ VIEW COMPARISON:  No recent. FINDINGS: Diffuse osteopenia and degenerative change. No evidence of fracture dislocation. IMPRESSION: Diffuse osteopenia degenerative change.  No acute abnormality. Electronically Signed   By: Marcello Moores  Register   On: 02/07/2017 09:57        Scheduled Meds: . bacitracin  1 application Topical BID  .  HYDROmorphone (DILAUDID) injection  0.5 mg Intravenous Q6H   Continuous Infusions: . sodium chloride Stopped (02/11/2017 1900)  . chlorproMAZINE (THORAZINE) IV       LOS: 1 day    Time spent: 35 minutes.     Elmarie Shiley, MD [facility] Pager [phone]  If 7PM-7AM, please contact night-coverage [email or web address] Password [email or web address] 01/26/2017, 1:18 PM

## 2017-01-26 NOTE — Progress Notes (Signed)
Daily Progress Note   Patient Name: [name]       Date: 01/26/2017 DOB: 09/21/1917  Age: 84 y.o. MRN#: [id number] Attending Physician: Elmarie Shiley, MD Primary Care Physician: Blanchie Serve, MD Admit Date: 02/03/2017  Reason for Consultation/Follow-up: Establishing goals of care and Terminal Care  Subjective: Patient opens eyes to pain (especially when left hand/arm is touched) but will not answer questions or follow commands. Shallow respirations. No family at bedside.   GOC:  Spoke with daughter, [name], via telephone. Family is on the way from [place]. Should arrive this afternoon.   Discussed hospital diagnoses, interventions, and underlying dementia. [name] tells me her mother has "end-stage dementia" and has good and bad days, including periods of agitation. She confirms comfort measures only. "As comfortable as possible." I discussed scheduling pain medication, which she is agreeable with. Educated on EOL expectations and comfort focused care, including comfort feeds. She speaks of her mother's love for coffee. Answered questions and provided emotional support. PMT contact information given.    Length of Stay: 1  Current Medications: Scheduled Meds:  . bacitracin  1 application Topical BID  .  HYDROmorphone (DILAUDID) injection  0.5 mg Intravenous Q6H    Continuous Infusions: . sodium chloride Stopped (02/07/2017 1900)  . chlorproMAZINE (THORAZINE) IV      PRN Meds: acetaminophen **OR** acetaminophen, antiseptic oral rinse, bisacodyl, chlorproMAZINE (THORAZINE) IV, diphenhydrAMINE, glycopyrrolate, HYDROmorphone (DILAUDID) injection, LORazepam, nystatin, ondansetron **OR** ondansetron (ZOFRAN) IV, polyvinyl alcohol  Physical Exam  Constitutional: She appears  lethargic. She appears ill.  HENT:  Head: Head is with laceration.  Neck:  Cervical collar  Cardiovascular: Regular rhythm.  Pulmonary/Chest: No accessory muscle usage. No tachypnea. No respiratory distress. She has decreased breath sounds.  Shallow respirations  Neurological: She appears lethargic.  Skin: Skin is dry. Ecchymosis noted.  Cool BLE. No mottling.  Nursing note and vitals reviewed.          Vital Signs: BP (!) 165/62 (BP Location: Right Arm)   Pulse 65   Temp (!) 97 F (36.1 C) (Oral)   Resp 12   Wt 48.5 kg (106 lb 14.8 oz)   SpO2 97%   BMI 19.56 kg/m  SpO2: SpO2: 97 % O2 Device: O2 Device: Nasal Cannula O2 Flow Rate: O2 Flow Rate (L/min): 2 L/min  Intake/output  summary:   Intake/Output Summary (Last 24 hours) at 01/26/2017 1037 Last data filed at 01/26/2017 0317 Gross per 24 hour  Intake 61.33 ml  Output -  Net 61.33 ml   LBM: Last BM Date: (PTA) Baseline Weight: Weight: 48.5 kg (106 lb 14.8 oz) Most recent weight: Weight: 48.5 kg (106 lb 14.8 oz)  Palliative Assessment/Data: PPS 10%     Patient Active Problem List   Diagnosis Date Noted  . End of life care 01/24/2017  . Closed type III fracture of odontoid process ([place])   . Facial laceration   . Palliative care by specialist   . Terminal care   . Multiple falls 01/22/2017  . Bradycardia 01/22/2017  . 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 Intracranial hemorrhage ([place]) 11/22/2016  . ICH (intracerebral hemorrhage) ([place]) 11/22/2016  . Cytotoxic brain edema ([place]) 11/22/2016  . Cerebral amyloid angiopathy (CODE) 11/22/2016  . Contracture of finger joint, right 11/17/2016  . Severe protein-calorie malnutrition ([place]) 09/18/2016  . Alzheimer's dementia without behavioral disturbance 09/18/2016  . Closed patellar sleeve fracture of right knee 03/17/2016  . Fall 03/17/2016  . Fracture of multiple ribs 03/17/2016  . Vitamin B 12 deficiency 02/15/2016  .  Hypothyroidism 12/14/2015  . Rash 09/21/2015  . Urinary tract infection 09/14/2015  . Constipation 06/15/2015  . Urinary frequency 06/15/2015  . Depression, major 06/15/2015  . Vaginal prolapse 06/15/2015  . Edema 04/21/2015  . Hallucinations 12/22/2014  . Speech abnormality   . TIA (transient ischemic attack) 12/10/2014  . Generalized anxiety disorder 06/23/2014  . Hearing loss 06/03/2014  . Insomnia 12/16/2013  . Trigger finger, acquired 03/04/2013  . Pain in hand   . Essential hypertension 03/15/2010  . Urinary incontinence 03/15/2010  . Depression, recurrent ([place]) 03/15/2010    Palliative Care Assessment & Plan   Patient Profile: [name] is a 84 y.o. Female with past medical history of Alzheimer's dementia, depression, generalized anxiety disorder, and recurrent falls. Recent hospital admission for ICH. Brought to ED via EMS after unwitnessed fall from nursing home. Found to have nondisplaced odontoid fracture and left-sided lamina of C1. Per attending, family requesting comfort measures only. Palliative medicine consultation for EOL care.   Assessment: Alzheimer's dementia Recurrent falls Fractures Stable ICH  Recommendations/Plan:  DNR/DNI. Comfort measures. No escalation of care.   Symptom management  Scheduled Dilaudid 0.5mg  IV q6h pain  Dilaudid 0.5mg  IV q2h prn breakthrough pain/dyspnea  Ativan 0.5mg  IV q4h prn anxiety  Robinul 0.2mg  IV q4h prn secretions  Dulcolax suppository prn constipation  Comfort feeds per patient/family request.  RN may pronounce.  Family not at bedside. On the way from [place]. Should arrive this afternoon/early evening.   Goals of Care and Additional Recommendations:  Limitations on Scope of Treatment: Full Comfort Care  Code Status: DNR   Code S tatus Orders  (From admission, onward)        Start     Ordered   02/13/2017 1211  Do not attempt resuscitation (DNR)  Continuous    Question Answer Comment  In  the event of cardiac or respiratory ARREST Do not call a "code blue"   In the event of cardiac or respiratory ARREST Do not perform Intubation, CPR, defibrillation or ACLS   In the event of cardiac or respiratory ARREST Use medication by any route, position, wound care, and other measures to relive pain and suffering. May use oxygen, suction and manual treatment of airway obstruction as needed for comfort.  02/05/2017 1211    Code Status History    Date Active Date Inactive Code Status Order ID Comments User Context   02/22/2017 11:47 01/31/2017 12:11 DNR [id number]  Rondel Jumbo, PA-C ED   11/22/2016 04:48 11/25/2016 00:25 DNR [id number]  Greta Doom, MD Inpatient   03/17/2016 14:21 03/19/2016 19:49 DNR [id number]  Annita Brod, MD Inpatient   01/21/2016 18:37 01/23/2016 21:18 DNR [id number]  Elmarie Shiley, MD Inpatient   12/11/2014 14:50 12/11/2014 18:34 Full Code [id number]  Theodis Blaze, MD Inpatient   12/10/2014 18:32 12/11/2014 14:50 DNR [id number]  Modena Jansky, MD Inpatient    Advance Directive Documentation     Most Recent Value  Type of Advance Directive  Living will, Out of facility DNR (pink MOST or yellow form), Healthcare Power of Attorney  Pre-existing out of facility DNR order (yellow form or pink MOST form)  Pink MOST form placed in chart (order not valid for inpatient use), Yellow form placed in chart (order not valid for inpatient use)  "MOST" Form in Place?  No data       Prognosis:   Poor prognosis. Likely days if not hours with shallow respirations.  Discharge Planning:  To Be Determined  Care plan was discussed with daughter [name]) and RN  Thank you for allowing the Palliative Medicine Team to assist in the care of this patient.   Time In: 1005 Time Out: 1040 Total Time 40min Prolonged Time Billed  no      Greater than 50%  of this time was spent counseling and coordinating care related to the above assessment and plan.  Ihor Dow, FNP-C Palliative Medicine Team  Phone: [phone] Fax: [phone]  Please contact Palliative Medicine Team phone at [phone] for questions and concerns.

## 2017-01-27 DIAGNOSIS — E43 Unspecified severe protein-calorie malnutrition: Secondary | ICD-10-CM

## 2017-01-27 DIAGNOSIS — S0181XA Laceration without foreign body of other part of head, initial encounter: Secondary | ICD-10-CM

## 2017-01-27 NOTE — Progress Notes (Signed)
Pt resting in bed with eyes closed, opens eyes to voice, nonverbal, respirations even and unlabored, foley draining, HOB 20 degrees, wound care provided to frontal scalp laceration, sutures intact, bacitracin applied and covered with gauze, no acute distress observed at this time, family at bedside.

## 2017-01-27 NOTE — Progress Notes (Signed)
PROGRESS NOTE    [name]  GGY:[id number] DOB: 10-03-1917 DOA: 02/13/2017 PCP: Blanchie Serve, MD    Brief Narrative: [name] is a 84 y.o. female with a Past Medical History of advanced dementia who presents with fractures following a fall.  She has a nondisplaced odontoid fracture and left-sided laminal fall of C1.  The family has requested comfort care measures.    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  Comfort care.  Palliative care following. Palliative discussed with family, goal is comfort care.  Continue IV schedule pain medications.  SW consulted for residential hospice.   Alzheimer's dementia; advanced.  Comfort care.      DVT prophylaxis: Comfort care.  Code Status: DNR Family Communication: Daughter and daughter in law at bedside.  Disposition Plan: to be determine   Consultants:   palliative  Procedures: none     Antimicrobials: none   Subjective: Eyes open, non verbal.   Objective: Vitals:   01/26/2017 1928 01/26/17 1619 01/26/17 2129 01/26/17 2129  BP: (!) 165/62 (!) 134/58 (!) 169/74 (!) 169/74  Pulse: 65 68 (!) 108 (!) 108  Resp: 12 10 16 16   Temp:  98.2 F (36.8 C) 99 F (37.2 C) 99 F (37.2 C)  TempSrc:  Oral Axillary Oral  SpO2: 97% 92% (!) 87% (!) 87%  Weight: 48.5 kg (106 lb 14.8 oz)       Intake/Output Summary (Last 24 hours) at 01/27/2017 1349 Last data filed at 01/27/2017 1019 Gross per 24 hour  Intake 80  ml  Output -  Net 80 ml   Filed Weights   02/19/2017 1928  Weight: 48.5 kg (106 lb 14.8 oz)    Examination:  General exam; lethargic, eyes open, Soft collar in place.  Respiratory system: bilateral ronchus.  Cardiovascular system: S 1, S 2 RRR Gastrointestinal system: BS present, soft, nt Central nervous system: Lethargic Extremities: no edema.       Data Reviewed: I have personally reviewed following labs and imaging studies  CBC: Recent Labs  Lab 02/08/2017 0928  WBC 8.1  NEUTROABS 4.4  HGB 12.6  HCT 37.1  MCV 101.1*  PLT 035   Basic Metabolic Panel: Recent Labs  Lab 01/24/2017 0928  NA 137  K 4.0  CL 103  CO2 26  GLUCOSE 95  BUN 24*  CREATININE 0.71  CALCIUM 9.2   GFR: Estimated Creatinine Clearance: 30.8 mL/min (by C-G formula based on SCr of 0.71 mg/dL). Liver Function Tests: No results for input(s): AST, ALT, ALKPHOS, BILITOT, PROT, ALBUMIN in the last 168 hours. No results for input(s): LIPASE, AMYLASE in the last 168 hours. No results for input(s): AMMONIA in the last 168 hours. Coagulation Profile: No results for input(s): INR, PROTIME in the last 168 hours. Cardiac Enzymes: No results for input(s): CKTOTAL, CKMB, CKMBINDEX, TROPONINI in the last 168 hours. BNP (last 3 results) No results for input(s): PROBNP in the last 8760 hours. HbA1C: No results for input(s): HGBA1C in the last 72 hours.  CBG: No results for input(s): GLUCAP in the last 168 hours. Lipid Profile: No results for input(s): CHOL, HDL, LDLCALC, TRIG, CHOLHDL, LDLDIRECT in the last 72 hours. Thyroid Function Tests: No results for input(s): TSH, T4TOTAL, FREET4, T3FREE, THYROIDAB in the last 72 hours. Anemia Panel: No results for input(s): VITAMINB12, FOLATE, FERRITIN, TIBC, IRON, RETICCTPCT in the last 72 hours. Sepsis Labs: No results for input(s): PROCALCITON, LATICACIDVEN in the last 168 hours.  Recent Results (from the past 240 hour(s))  MRSA PCR Screening     Status:  None   Collection Time: 01/26/17 12:50 AM  Result Value Ref Range Status   MRSA by PCR NEGATIVE NEGATIVE Final    Comment:        The GeneXpert MRSA Assay (FDA approved for NASAL specimens only), is one component of a comprehensive MRSA colonization surveillance program. It is not intended to diagnose MRSA infection nor to guide or monitor treatment for MRSA infections.          Radiology Studies: No results found.      Scheduled Meds: . bacitracin  1 application Topical BID  .  HYDROmorphone (DILAUDID) injection  0.5 mg Intravenous Q6H   Continuous Infusions: . sodium chloride Stopped (01/24/2017 1900)  . chlorproMAZINE (THORAZINE) IV       LOS: 2 days    Time spent: 35 minutes.     Elmarie Shiley, MD [facility] Pager [phone]  If 7PM-7AM, please contact night-coverage [email or web address] Password [email or web address] 01/27/2017, 1:49 PM

## 2017-01-27 NOTE — Progress Notes (Signed)
Daily Progress Note   Patient Name: [name]       Date: 01/27/2017 DOB: 12-10-1917  Age: 84 y.o. MRN#: [id number] Attending Physician: Elmarie Shiley, MD Primary Care Physician: Blanchie Serve, MD Admit Date: 02/07/2017  Reason for Consultation/Follow-up: Establishing goals of care and Terminal Care  Subjective: Patient eyes open during my visit. No s/s of pain or discomfort. No speech.   GOC:  Daughter [name]) and DIL [name]) at bedside. They feel their mother has remained comfortable with schedule pain medication. We gave her a small amount of coffee on a swab, which she accepted. [name] and [name] again confirm focus on comfort measures only.   Educated on EOL expectations and medications as needed to ensure comfort and dignity at EOL.   Discussed hospice options for which they prefer residential hospice facility. They understand if she remains stable, she may transfer to hospice facility in the next day or two.   Emotional and spiritual support provided. Agreeable to visit from chaplain. Therapeutic listening as [name] and [name] share many stories of their mother's love for traveling (even in her 14's) and remaining as active as possible.   Length of Stay: 2  Current Medications: Scheduled Meds:  . bacitracin  1 application Topical BID  .  HYDROmorphone (DILAUDID) injection  0.5 mg Intravenous Q6H    Continuous Infusions: . sodium chloride Stopped (02/17/2017 1900)  . chlorproMAZINE (THORAZINE) IV      PRN Meds: acetaminophen **OR** acetaminophen, antiseptic oral rinse, bisacodyl, chlorproMAZINE (THORAZINE) IV, diphenhydrAMINE, glycopyrrolate, HYDROmorphone (DILAUDID) injection, LORazepam, nystatin, ondansetron **OR** ondansetron (ZOFRAN) IV, polyvinyl  alcohol  Physical Exam  Constitutional: She is easily aroused. She appears ill.  HENT:  Head: Head is with laceration.  Neck:  Cervical collar  Cardiovascular: Regular rhythm.  Pulmonary/Chest: No accessory muscle usage. No tachypnea. No respiratory distress. She has decreased breath sounds.  Shallow respirations  Neurological: She is alert and easily aroused.  No speech  Skin: Skin is dry. Ecchymosis noted.  Dusky  Psychiatric: She is noncommunicative. She is inattentive.  Nursing note and vitals reviewed.          Vital Signs: BP (!) 169/74 (BP Location: Left Arm)   Pulse (!) 108   Temp 99 F (37.2 C) (Oral)   Resp 16   Wt 48.5 kg (106  lb 14.8 oz)   SpO2 (!) 87%   BMI 19.56 kg/m  SpO2: SpO2: (!) 87 % O2 Device: O2 Device: Nasal Cannula O2 Flow Rate: O2 Flow Rate (L/min): 2 L/min  Intake/output summary:   Intake/Output Summary (Last 24 hours) at 01/27/2017 1231 Last data filed at 01/27/2017 1019 Gross per 24 hour  Intake 80 ml  Output -  Net 80 ml   LBM: Last BM Date: (PTA) Baseline Weight: Weight: 48.5 kg (106 lb 14.8 oz) Most recent weight: Weight: 48.5 kg (106 lb 14.8 oz)  Palliative Assessment/Data: PPS 10%     Patient Active Problem List   Diagnosis Date Noted  . Generalized pain   . End of life care 02/11/2017  . Closed type III fracture of odontoid process ([name])   . Facial laceration   . Palliative care by specialist   . Terminal care   . Multiple falls 01/22/2017  . Bradycardia 01/22/2017  . 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 Intracranial hemorrhage ([name]) 11/22/2016  . ICH (intracerebral hemorrhage) ([name]) 11/22/2016  . Cytotoxic brain edema ([name]) 11/22/2016  . Cerebral amyloid angiopathy (CODE) 11/22/2016  . Contracture of finger joint, right 11/17/2016  . Severe protein-calorie malnutrition ([name]) 09/18/2016  . Alzheimer's dementia without behavioral disturbance 09/18/2016  . Closed patellar sleeve  fracture of right knee 03/17/2016  . Fall 03/17/2016  . Fracture of multiple ribs 03/17/2016  . Vitamin B 12 deficiency 02/15/2016  . Hypothyroidism 12/14/2015  . Rash 09/21/2015  . Urinary tract infection 09/14/2015  . Constipation 06/15/2015  . Urinary frequency 06/15/2015  . Depression, major 06/15/2015  . Vaginal prolapse 06/15/2015  . Edema 04/21/2015  . Hallucinations 12/22/2014  . Speech abnormality   . TIA (transient ischemic attack) 12/10/2014  . Generalized anxiety disorder 06/23/2014  . Hearing loss 06/03/2014  . Insomnia 12/16/2013  . Trigger finger, acquired 03/04/2013  . Pain in hand   . Essential hypertension 03/15/2010  . Urinary incontinence 03/15/2010  . Depression, recurrent ([name]) 03/15/2010    Palliative Care Assessment & Plan   Patient Profile: [name] is a 84 y.o. Female with past medical history of Alzheimer's dementia, depression, generalized anxiety disorder, and recurrent falls. Recent hospital admission for ICH. Brought to ED via EMS after unwitnessed fall from nursing home. Found to have nondisplaced odontoid fracture and left-sided lamina of C1. Per attending, family requesting comfort measures only. Palliative medicine consultation for EOL care.   Assessment: Alzheimer's dementia Recurrent falls Fractures Stable ICH  Recommendations/Plan:  DNR/DNI. Comfort measures. No escalation of care.   Symptom management  Continue scheduled Dilaudid 0.5mg  q6h  Dilaudid 0.5mg  IV q2h prn breakthrough pain/dyspnea  Ativan 0.5mg  IV q4h prn anxiety  Robinul 0.2mg  IV q4h prn secretions  Dulcolax suppository prn constipation  Comfort feeds per patient/family request. Aspiration precautions.   SW consult for residential hospice placement. Currently stable for transfer to hospice facility.  Goals of Care and Additional Recommendations:  Limitations on Scope of Treatment: Full Comfort Care  Code Status: DNR   Code S tatus Orders  (From  admission, onward)        Start     Ordered   01/26/2017 1211  Do not attempt resuscitation (DNR)  Continuous    Question Answer Comment  In the event of cardiac or respiratory ARREST Do not call a "code blue"   In the event of cardiac or respiratory ARREST Do not perform Intubation, CPR, defibrillation or ACLS   In the  event of cardiac or respiratory ARREST Use medication by any route, position, wound care, and other measures to relive pain and suffering. May use oxygen, suction and manual treatment of airway obstruction as needed for comfort.      02/07/2017 1211    Code Status History    Date Active Date Inactive Code Status Order ID Comments User Context   02/06/2017 11:47 01/30/2017 12:11 DNR [id number]  Rondel Jumbo, PA-C ED   11/22/2016 04:48 11/25/2016 00:25 DNR [id number]  Greta Doom, MD Inpatient   03/17/2016 14:21 03/19/2016 19:49 DNR [id number]  Annita Brod, MD Inpatient   01/21/2016 18:37 01/23/2016 21:18 DNR [id number]  Elmarie Shiley, MD Inpatient   12/11/2014 14:50 12/11/2014 18:34 Full Code [id number]  Theodis Blaze, MD Inpatient   12/10/2014 18:32 12/11/2014 14:50 DNR [id number]  Modena Jansky, MD Inpatient    Advance Directive Documentation     Most Recent Value  Type of Advance Directive  Living will, Out of facility DNR (pink MOST or yellow form), Healthcare Power of Attorney  Pre-existing out of facility DNR order (yellow form or pink MOST form)  Pink MOST form placed in chart (order not valid for inpatient use), Yellow form placed in chart (order not valid for inpatient use)  "MOST" Form in Place?  No data       Prognosis:   Poor prognosis. Likely days.  Discharge Planning:  To Be Determined possible residential hospice facility if she remains stable.   Care plan was discussed with daughter [name]), DIL [name]), Dr. Tyrell Antonio, RN  Thank you for allowing the Palliative Medicine Team to assist in the care of this patient.   Time In: 1200  Time Out: 1235 Total Time 26min Prolonged Time Billed  no      Greater than 50%  of this time was spent counseling and coordinating care related to the above assessment and plan.  Ihor Dow, FNP-C Palliative Medicine Team  Phone: [phone] Fax: [phone]  Please contact Palliative Medicine Team phone at [phone] for questions and concerns.

## 2017-01-27 NOTE — Progress Notes (Signed)
   01/27/17 1853  Clinical Encounter Type  Visited With Patient;Health care provider  Visit Type Follow-up  Referral From Nurse  Consult/Referral To Chaplain   Responded to a [facility] for EOL.  I had spent time with this patient on the day of admittance.  Nurse indicated family had been present today.  Patient was resting comfortably and I had prayer at bedside.  Will follow as needed. Chaplain Katherene Ponto

## 2017-01-28 NOTE — Progress Notes (Signed)
PROGRESS NOTE    [name]  VOZ:[id number] DOB: 1917-09-21 DOA: 02/05/2017 PCP: Blanchie Serve, MD    Brief Narrative: [name] is a 84 y.o. female with a Past Medical History of advanced dementia who presents with fractures following a fall.  She has a nondisplaced odontoid fracture and left-sided laminal fall of C1.  The family has requested comfort care measures.    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  Comfort care.  Palliative care following. Palliative discussed with family, goal is comfort care.  Continue IV schedule pain medications.  SW consulted for residential hospice.  Lethargic this am. Mouth breathing/   Alzheimer's dementia; advanced.  Comfort care.      DVT prophylaxis: Comfort care.  Code Status: DNR Family Communication: Daughter and daughter in law 1-05 Disposition Plan: to be determine   Consultants:   palliative  Procedures: none     Antimicrobials: none   Subjective: Non verbal. lethargic  Objective: Vitals:   01/26/17 2129 01/27/17 1634 01/27/17 2114 01/28/17 0531  BP: (!) 169/74 (!) 113/50 119/63 136/62  Pulse: (!) 108 (!) 113 97 (!) 101  Resp: 16 12 16 18   Temp: 99 F (37.2 C) 98.6 F (37 C) (!) 97.3 F (36.3 C) 99.7 F (37.6 C)  TempSrc: Oral  Oral Oral  SpO2: (!) 87% (!) 89% (!) 86% (!) 87%  Weight:        Intake/Output Summary (Last 24 hours) at 01/28/2017 1133 Last data filed at 01/27/2017  1840 Gross per 24 hour  Intake -  Output 150 ml  Net -150 ml   Filed Weights   02/15/2017 1928  Weight: 48.5 kg (106 lb 14.8 oz)    Examination:  General exam; soft collar in place, lethargic, mouth breathing.  Respiratory system: bilateral ronchus Cardiovascular system: S 1, S RRR Gastrointestinal system: BS present, soft, nt Central nervous system: letahrgic Extremities: no edema.       Data Reviewed: I have personally reviewed following labs and imaging studies  CBC: Recent Labs  Lab 02/02/2017 0928  WBC 8.1  NEUTROABS 4.4  HGB 12.6  HCT 37.1  MCV 101.1*  PLT 425   Basic Metabolic Panel: Recent Labs  Lab 01/28/2017 0928  NA 137  K 4.0  CL 103  CO2 26  GLUCOSE 95  BUN 24*  CREATININE 0.71  CALCIUM 9.2   GFR: Estimated Creatinine Clearance: 30.8 mL/min (by C-G formula based on SCr of 0.71 mg/dL). Liver Function Tests: No results for input(s): AST, ALT, ALKPHOS, BILITOT, PROT, ALBUMIN in the last 168 hours. No results for input(s): LIPASE, AMYLASE in the last 168 hours. No results for input(s): AMMONIA in the last 168 hours. Coagulation Profile: No results for input(s): INR, PROTIME in the last 168 hours. Cardiac Enzymes: No results for input(s): CKTOTAL, CKMB, CKMBINDEX, TROPONINI in the last 168 hours. BNP (last 3 results) No results for input(s): PROBNP in the last 8760 hours. HbA1C: No results for input(s): HGBA1C in the last 72 hours.  CBG: No results for input(s): GLUCAP in the last 168 hours. Lipid Profile: No results for input(s): CHOL, HDL, LDLCALC, TRIG, CHOLHDL, LDLDIRECT in the last 72 hours. Thyroid Function Tests: No results for input(s): TSH, T4TOTAL, FREET4, T3FREE, THYROIDAB in the last 72 hours. Anemia Panel: No results for input(s): VITAMINB12, FOLATE, FERRITIN, TIBC, IRON, RETICCTPCT in the last 72 hours. Sepsis Labs: No results for input(s): PROCALCITON, LATICACIDVEN in the last 168 hours.  Recent Results (from the past 240  hour(s))  MRSA PCR Screening     Status: None   Collection Time: 01/26/17 12:50 AM  Result Value Ref Range Status   MRSA by PCR NEGATIVE NEGATIVE Final    Comment:        The GeneXpert MRSA Assay (FDA approved for NASAL specimens only), is one component of a comprehensive MRSA colonization surveillance program. It is not intended to diagnose MRSA infection nor to guide or monitor treatment for MRSA infections.          Radiology Studies: No results found.      Scheduled Meds: . bacitracin  1 application Topical BID  .  HYDROmorphone (DILAUDID) injection  0.5 mg Intravenous Q6H   Continuous Infusions: . sodium chloride Stopped (01/28/2017 1900)  . chlorproMAZINE (THORAZINE) IV       LOS: 3 days    Time spent: 35 minutes.     Elmarie Shiley, MD [facility] Pager [phone]  If 7PM-7AM, please contact night-coverage [email or web address] Password [facility] 01/28/2017, 11:33 AM

## 2017-02-23 NOTE — Progress Notes (Signed)
Nutrition Brief Note  Chart reviewed. Pt was made comfort care upon admission No further nutrition interventions warranted at this time.  Please re-consult as needed.   [name]. Lavarius Doughten, MS, RD LDN Inpatient Clinical Dietitian Pager [phone]

## 2017-02-23 NOTE — Progress Notes (Signed)
   Feb 08, 2017 0859  Notifications  Bed Control notified that Post Mortem checklist is complete Yes  Bed Control notified body transferred Transported to morgue  Attending [place]  Attending Physician Notified Y  Attending Physician Name Johnnette Litter  Will the above attending physician sign death certificate?  (non ED physician) Yes  Post Mortem Checklist  Date of Death 02/08/17  Time of Death 0859  Pronounced By Cloretta Ned   Next of kin notified Yes  Name of next of kin notified of death jeanne zimmer  Contact Person's Relationship to Patient Daughter  Was the patient a No Code Blue or a Limited Code Blue? Yes  Did the patient die unattended? Yes  Patient restrained? Not applicable  [place] [facility]  Notification Date 08-Feb-2017  [place] [facility] Number [id number]  Autopsy  Autopsy requested by N/A  Patient Belongings/Medications Returned  Patient belongings from bedside/safe/pharmacy returned  None  Dead on Arrival (Emergency Department)  Patient dead on arrival? No  TO [place]  [place] Notified N  Medical Examiner  Is this a medical examiner's case? N

## 2017-02-23 NOTE — Progress Notes (Signed)
Patient is found in room non responsive, cool to the touch, not breathing with no pulse. Pupils non reactive to light. Patient is a DNR. Attending MD paged and made aware.

## 2017-02-23 NOTE — Care Management Important Message (Signed)
Important Message  Patient Details  Name: [name] MRN: [id number] Date of Birth: Jan 03, 1918   Medicare Important Message Given:  Yes    Nathen May 02/18/2017, 9:33 AM

## 2017-02-23 NOTE — Progress Notes (Signed)
Patient in room breathing is very shallow. No evidence of pain or distress at this time will continue to monitor.

## 2017-02-23 NOTE — Death Summary Note (Signed)
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 She has a nondisplaced odontoid fracture and left-sided laminal fall of C1. The family has requested comfort care measures.    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 Comfort care.  Palliative care following. Palliative discussed with family, goal is comfort care.  Continue IV schedule pain medications.  Patient was admitted after a fall, found to have C spine fracture. Family wishes for comfort care.  She was treated with IV pain medication. She was going to be transfer to residential hospice was she died Feb 05, 2022, as expected.   Alzheimer's dementia; advanced.  Comfort care.        Time: 1;94R AM  Signed:  Blu Mcglaun A Criston Chancellor  [facility] 2017/02/05, 4:56 PM

## 2017-02-23 DEATH — deceased

## 2017-03-20 ENCOUNTER — Ambulatory Visit: Payer: Medicare Other | Admitting: Neurology

## 2017-12-30 IMAGING — CT CT HEAD W/O CM
4 series · 16 of 47 positions shown, 18 images · non-contrast
Comparison: 12/10/2014

CLINICAL DATA: Syncope

EXAM:
CT HEAD WITHOUT CONTRAST
TECHNIQUE: Contiguous axial images were obtained from the base of the skull
through the vertex without intravenous contrast.

[Series 2: head without · axial · non-contrast · 0.41mm/px · z∈[-149,-29]mm · 7 of 32 slices shown, 9 images]
[im 4/32  brain]
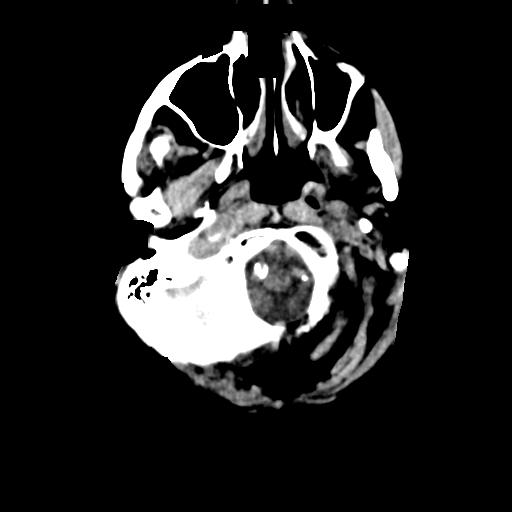
[im 4/32  bone]
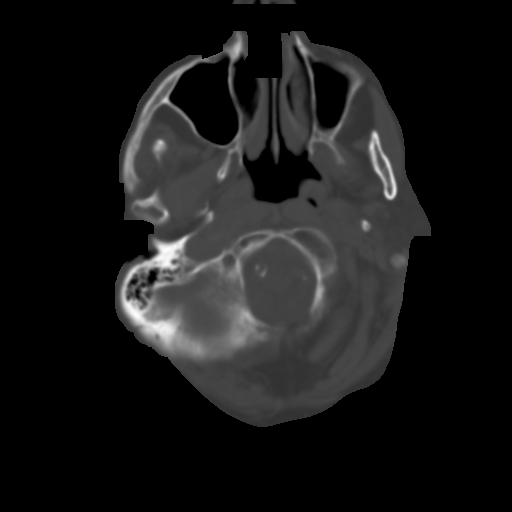
[im 8/32  brain]
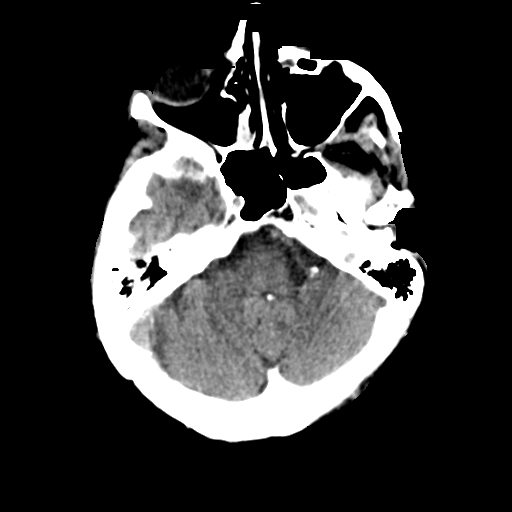
[im 12/32  brain]
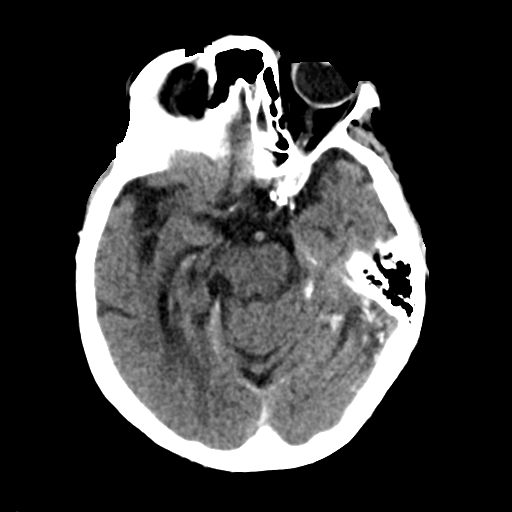
[im 16/32  brain]
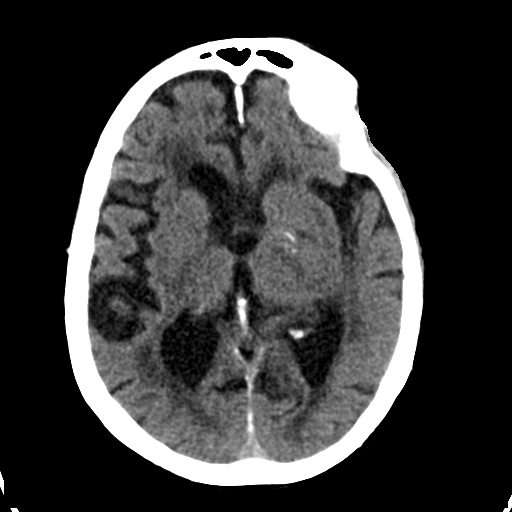
[im 20/32  brain]
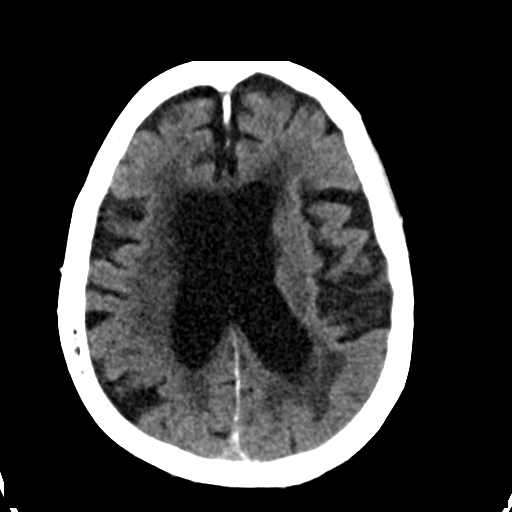
[im 20/32  bone]
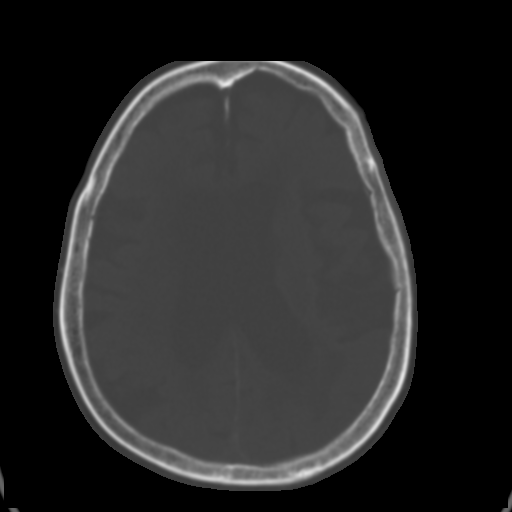
[im 24/32  brain]
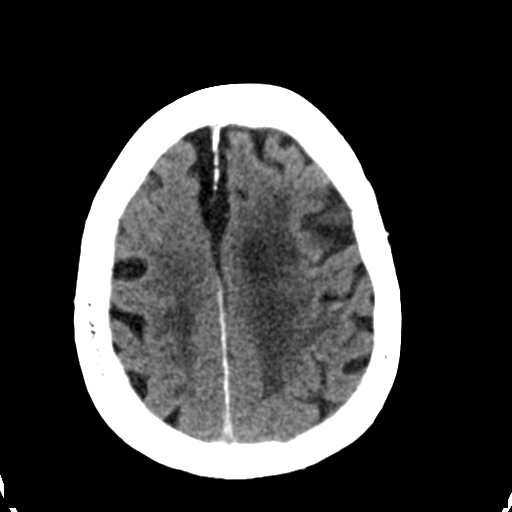
[im 28/32  brain]
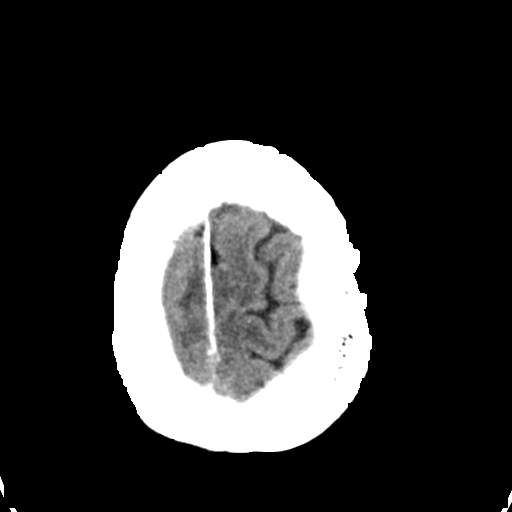

[Series 3: head bone · axial · 0.41mm/px · z∈[-150,-118]mm · 3 of 79 slices shown]
[im 8/79  bone]
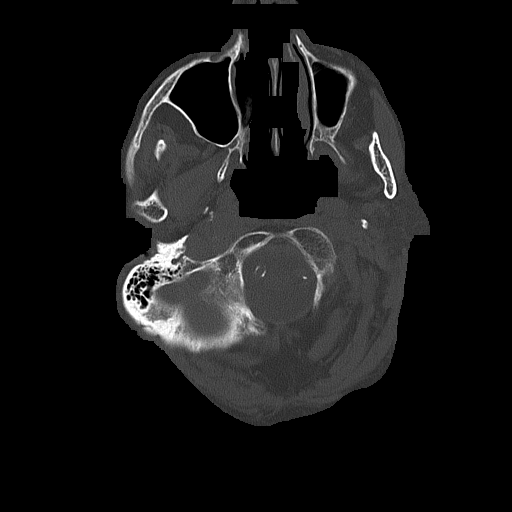
[im 16/79  bone]
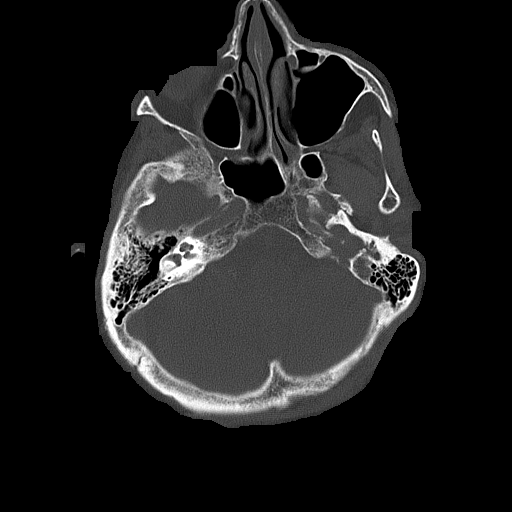
[im 24/79  bone]
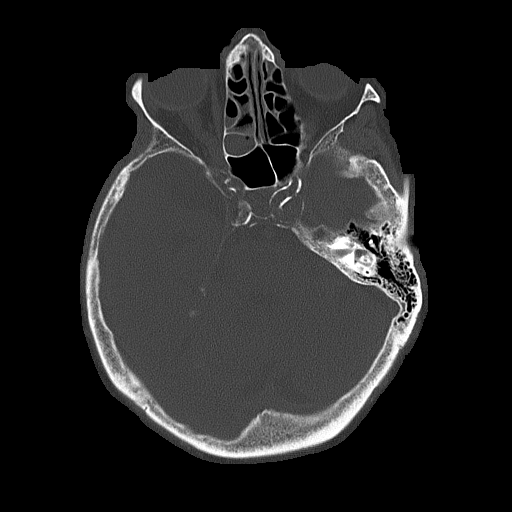

[Series 4: head without cor · coronal · non-contrast · 0.35mm/px · 3 of 67 slices shown]
[im 23/67  brain]
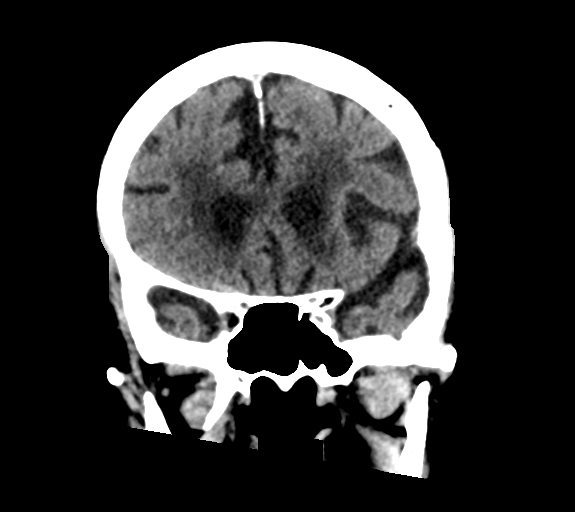
[im 30/67  brain]
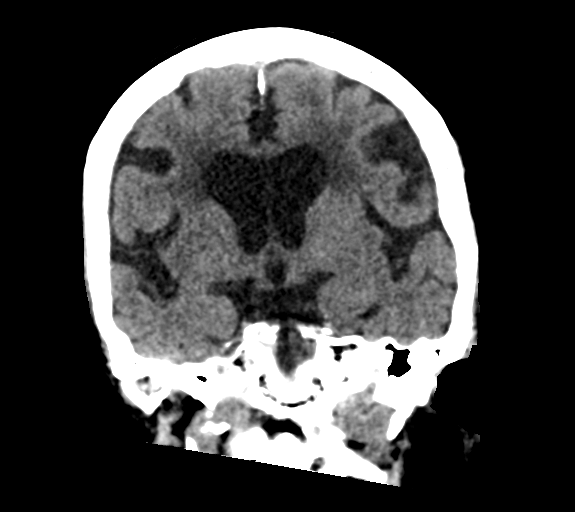
[im 37/67  brain]
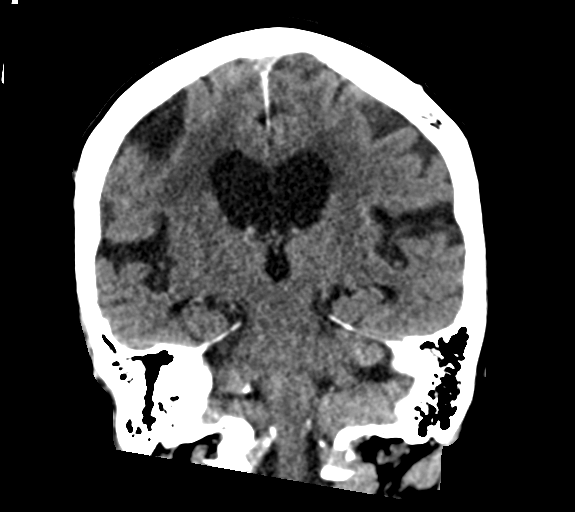

[Series 5: head without sag · sagittal · non-contrast · 0.33mm/px · 3 of 59 slices shown]
[im 22/59  brain]
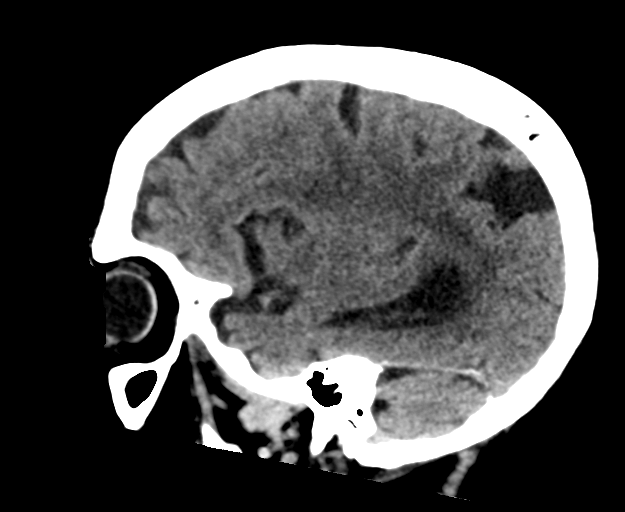
[im 30/59  brain]
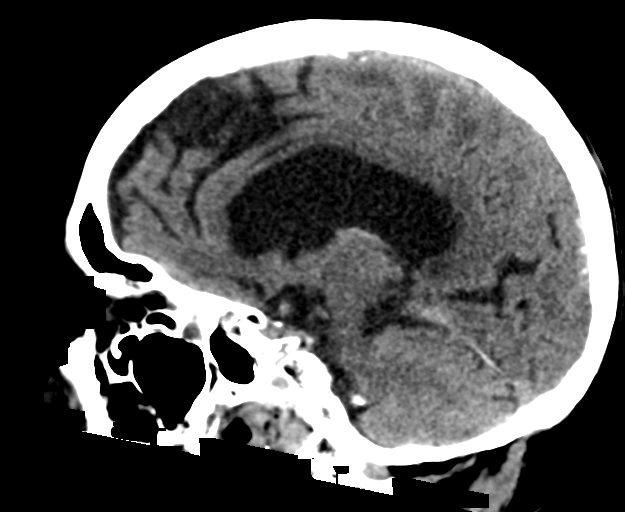
[im 37/59  brain]
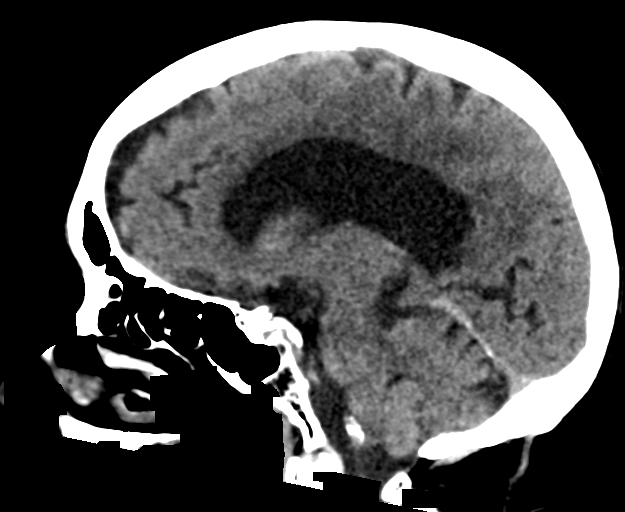

[16 of 47 positions shown; findings below may reference images not displayed]

FINDINGS: Brain: There is no evidence for acute hemorrhage, hydrocephalus,
mass lesion, or abnormal extra-axial fluid collection. No definite
CT evidence for acute infarction. Diffuse loss of parenchymal volume
is consistent with atrophy. Patchy low attenuation in the deep
hemispheric and periventricular white matter is nonspecific, but
likely reflects chronic microvascular ischemic demyelination.

Vascular: Atherosclerotic calcification is visualized in the carotid
arteries. No dense MCA sign. Major dural sinuses are unremarkable.

Skull: No evidence for fracture. No worrisome lytic or sclerotic
lesion.

Sinuses/Orbits: Opacification posterior right ethmoid air cells
noted. Remaining visualized paranasal sinuses and mastoid air cells
are clear. Visualized portions of the globes and intraorbital fat
are unremarkable.

Other: None.
IMPRESSION: 1. Stable exam.  No acute intracranial abnormality.
2. Moderate atrophy with chronic small vessel white matter ischemic
disease.
